# Patient Record
Sex: Female | Born: 1970 | Race: White | Hispanic: No | Smoking: Former smoker
Health system: Southern US, Community
[De-identification: ages and names within clinical notes are randomized; demographics above are authoritative.]

## PROBLEM LIST (undated history)

## (undated) DIAGNOSIS — L723 Sebaceous cyst: Secondary | ICD-10-CM

## (undated) DIAGNOSIS — M810 Age-related osteoporosis without current pathological fracture: Secondary | ICD-10-CM

## (undated) DIAGNOSIS — G43909 Migraine, unspecified, not intractable, without status migrainosus: Secondary | ICD-10-CM

## (undated) DIAGNOSIS — D696 Thrombocytopenia, unspecified: Secondary | ICD-10-CM

## (undated) DIAGNOSIS — S9000XA Contusion of unspecified ankle, initial encounter: Secondary | ICD-10-CM

## (undated) DIAGNOSIS — E785 Hyperlipidemia, unspecified: Secondary | ICD-10-CM

## (undated) DIAGNOSIS — J309 Allergic rhinitis, unspecified: Secondary | ICD-10-CM

## (undated) DIAGNOSIS — F329 Major depressive disorder, single episode, unspecified: Secondary | ICD-10-CM

## (undated) DIAGNOSIS — F17200 Nicotine dependence, unspecified, uncomplicated: Secondary | ICD-10-CM

## (undated) DIAGNOSIS — E109 Type 1 diabetes mellitus without complications: Secondary | ICD-10-CM

## (undated) DIAGNOSIS — B360 Pityriasis versicolor: Secondary | ICD-10-CM

## (undated) DIAGNOSIS — E11319 Type 2 diabetes mellitus with unspecified diabetic retinopathy without macular edema: Secondary | ICD-10-CM

## (undated) DIAGNOSIS — F319 Bipolar disorder, unspecified: Secondary | ICD-10-CM

## (undated) DIAGNOSIS — I10 Essential (primary) hypertension: Secondary | ICD-10-CM

## (undated) DIAGNOSIS — Z79899 Other long term (current) drug therapy: Secondary | ICD-10-CM

## (undated) HISTORY — DX: Nicotine dependence, unspecified, uncomplicated: F17.200

## (undated) HISTORY — DX: Allergic rhinitis, unspecified: J30.9

## (undated) HISTORY — DX: Sebaceous cyst: L72.3

## (undated) HISTORY — DX: Contusion of unspecified ankle, initial encounter: S90.00XA

## (undated) HISTORY — DX: Major depressive disorder, single episode, unspecified: F32.9

## (undated) HISTORY — DX: Type 1 diabetes mellitus without complications: E10.9

## (undated) HISTORY — DX: Migraine, unspecified, not intractable, without status migrainosus: G43.909

## (undated) HISTORY — DX: Bipolar disorder, unspecified: F31.9

## (undated) HISTORY — DX: Type 2 diabetes mellitus with unspecified diabetic retinopathy without macular edema: E11.319

## (undated) HISTORY — PX: TUBAL LIGATION: SHX77

## (undated) HISTORY — DX: Other long term (current) drug therapy: Z79.899

## (undated) HISTORY — DX: Thrombocytopenia, unspecified: D69.6

## (undated) HISTORY — DX: Pityriasis versicolor: B36.0

---

## 1996-12-10 HISTORY — PX: OTHER SURGICAL HISTORY: SHX169

## 1997-12-01 ENCOUNTER — Encounter: Payer: Self-pay | Admitting: Emergency Medicine

## 1997-12-01 ENCOUNTER — Emergency Department (HOSPITAL_COMMUNITY): Admission: EM | Admit: 1997-12-01 | Discharge: 1997-12-01 | Payer: Self-pay | Admitting: Emergency Medicine

## 1998-03-01 ENCOUNTER — Emergency Department (HOSPITAL_COMMUNITY): Admission: EM | Admit: 1998-03-01 | Discharge: 1998-03-01 | Payer: Self-pay | Admitting: Emergency Medicine

## 1999-05-12 ENCOUNTER — Ambulatory Visit (HOSPITAL_BASED_OUTPATIENT_CLINIC_OR_DEPARTMENT_OTHER): Admission: RE | Admit: 1999-05-12 | Discharge: 1999-05-12 | Payer: Self-pay | Admitting: Surgery

## 2000-06-29 ENCOUNTER — Encounter: Admission: RE | Admit: 2000-06-29 | Discharge: 2000-07-21 | Payer: Self-pay | Admitting: Orthopedic Surgery

## 2001-07-09 ENCOUNTER — Other Ambulatory Visit: Admission: RE | Admit: 2001-07-09 | Discharge: 2001-07-09 | Payer: Self-pay | Admitting: Obstetrics and Gynecology

## 2001-07-09 ENCOUNTER — Other Ambulatory Visit: Admission: RE | Admit: 2001-07-09 | Discharge: 2001-07-24 | Payer: Self-pay | Admitting: Obstetrics and Gynecology

## 2001-10-24 ENCOUNTER — Ambulatory Visit (HOSPITAL_COMMUNITY): Admission: RE | Admit: 2001-10-24 | Discharge: 2001-10-24 | Payer: Self-pay | Admitting: Obstetrics and Gynecology

## 2001-10-24 ENCOUNTER — Encounter: Payer: Self-pay | Admitting: Obstetrics and Gynecology

## 2001-11-08 ENCOUNTER — Encounter: Admission: RE | Admit: 2001-11-08 | Discharge: 2002-02-06 | Payer: Self-pay | Admitting: Endocrinology

## 2001-11-27 ENCOUNTER — Ambulatory Visit (HOSPITAL_COMMUNITY): Admission: RE | Admit: 2001-11-27 | Discharge: 2001-11-27 | Payer: Self-pay | Admitting: Obstetrics and Gynecology

## 2001-11-27 ENCOUNTER — Encounter: Payer: Self-pay | Admitting: Obstetrics and Gynecology

## 2001-12-17 ENCOUNTER — Inpatient Hospital Stay (HOSPITAL_COMMUNITY): Admission: AD | Admit: 2001-12-17 | Discharge: 2001-12-17 | Payer: Self-pay | Admitting: Obstetrics and Gynecology

## 2002-01-01 ENCOUNTER — Inpatient Hospital Stay (HOSPITAL_COMMUNITY): Admission: AD | Admit: 2002-01-01 | Discharge: 2002-01-04 | Payer: Self-pay | Admitting: Obstetrics and Gynecology

## 2003-09-02 ENCOUNTER — Other Ambulatory Visit: Admission: RE | Admit: 2003-09-02 | Discharge: 2003-09-02 | Payer: Self-pay | Admitting: Obstetrics and Gynecology

## 2003-10-21 ENCOUNTER — Ambulatory Visit (HOSPITAL_COMMUNITY): Admission: RE | Admit: 2003-10-21 | Discharge: 2003-10-21 | Payer: Self-pay | Admitting: Obstetrics and Gynecology

## 2003-12-15 ENCOUNTER — Inpatient Hospital Stay (HOSPITAL_COMMUNITY): Admission: AD | Admit: 2003-12-15 | Discharge: 2003-12-15 | Payer: Self-pay | Admitting: Obstetrics and Gynecology

## 2003-12-16 ENCOUNTER — Ambulatory Visit: Payer: Self-pay | Admitting: Endocrinology

## 2003-12-16 ENCOUNTER — Ambulatory Visit: Payer: Self-pay | Admitting: Internal Medicine

## 2003-12-16 ENCOUNTER — Inpatient Hospital Stay (HOSPITAL_COMMUNITY): Admission: AD | Admit: 2003-12-16 | Discharge: 2003-12-19 | Payer: Self-pay | Admitting: Endocrinology

## 2003-12-22 ENCOUNTER — Inpatient Hospital Stay (HOSPITAL_COMMUNITY): Admission: AD | Admit: 2003-12-22 | Discharge: 2003-12-24 | Payer: Self-pay | Admitting: Obstetrics and Gynecology

## 2003-12-29 ENCOUNTER — Ambulatory Visit (HOSPITAL_COMMUNITY): Admission: RE | Admit: 2003-12-29 | Discharge: 2003-12-29 | Payer: Self-pay | Admitting: Obstetrics and Gynecology

## 2004-01-14 ENCOUNTER — Inpatient Hospital Stay (HOSPITAL_COMMUNITY): Admission: AD | Admit: 2004-01-14 | Discharge: 2004-01-16 | Payer: Self-pay | Admitting: Obstetrics and Gynecology

## 2004-01-14 ENCOUNTER — Ambulatory Visit: Payer: Self-pay | Admitting: Internal Medicine

## 2004-01-20 ENCOUNTER — Inpatient Hospital Stay (HOSPITAL_COMMUNITY): Admission: AD | Admit: 2004-01-20 | Discharge: 2004-01-20 | Payer: Self-pay | Admitting: Obstetrics and Gynecology

## 2004-01-29 ENCOUNTER — Ambulatory Visit (HOSPITAL_COMMUNITY): Admission: RE | Admit: 2004-01-29 | Discharge: 2004-01-29 | Payer: Self-pay | Admitting: Obstetrics and Gynecology

## 2004-02-12 ENCOUNTER — Inpatient Hospital Stay (HOSPITAL_COMMUNITY): Admission: AD | Admit: 2004-02-12 | Discharge: 2004-02-22 | Payer: Self-pay | Admitting: Obstetrics and Gynecology

## 2004-02-18 ENCOUNTER — Encounter (INDEPENDENT_AMBULATORY_CARE_PROVIDER_SITE_OTHER): Payer: Self-pay | Admitting: *Deleted

## 2004-04-13 ENCOUNTER — Ambulatory Visit: Payer: Self-pay | Admitting: Endocrinology

## 2004-04-22 ENCOUNTER — Ambulatory Visit: Payer: Self-pay | Admitting: Endocrinology

## 2004-04-27 ENCOUNTER — Ambulatory Visit: Payer: Self-pay | Admitting: Endocrinology

## 2004-08-02 ENCOUNTER — Ambulatory Visit: Payer: Self-pay | Admitting: Endocrinology

## 2004-09-17 ENCOUNTER — Ambulatory Visit (HOSPITAL_BASED_OUTPATIENT_CLINIC_OR_DEPARTMENT_OTHER): Admission: RE | Admit: 2004-09-17 | Discharge: 2004-09-17 | Payer: Self-pay | Admitting: Endocrinology

## 2004-09-22 ENCOUNTER — Ambulatory Visit: Payer: Self-pay | Admitting: Pulmonary Disease

## 2004-10-05 ENCOUNTER — Ambulatory Visit: Payer: Self-pay | Admitting: Pulmonary Disease

## 2004-12-20 ENCOUNTER — Emergency Department (HOSPITAL_COMMUNITY): Admission: EM | Admit: 2004-12-20 | Discharge: 2004-12-20 | Payer: Self-pay | Admitting: Emergency Medicine

## 2005-01-04 ENCOUNTER — Ambulatory Visit: Payer: Self-pay | Admitting: Internal Medicine

## 2005-02-03 ENCOUNTER — Ambulatory Visit: Payer: Self-pay | Admitting: Endocrinology

## 2005-02-09 ENCOUNTER — Ambulatory Visit: Payer: Self-pay | Admitting: Pulmonary Disease

## 2005-02-15 ENCOUNTER — Encounter: Admission: RE | Admit: 2005-02-15 | Discharge: 2005-05-16 | Payer: Self-pay | Admitting: Endocrinology

## 2005-02-22 ENCOUNTER — Ambulatory Visit: Payer: Self-pay | Admitting: Endocrinology

## 2005-03-23 ENCOUNTER — Ambulatory Visit: Payer: Self-pay | Admitting: Endocrinology

## 2005-04-06 ENCOUNTER — Ambulatory Visit: Payer: Self-pay | Admitting: Pulmonary Disease

## 2005-06-06 ENCOUNTER — Ambulatory Visit: Payer: Self-pay | Admitting: Endocrinology

## 2005-06-06 ENCOUNTER — Ambulatory Visit: Payer: Self-pay | Admitting: Pulmonary Disease

## 2005-06-15 ENCOUNTER — Ambulatory Visit: Payer: Self-pay | Admitting: Endocrinology

## 2005-09-05 ENCOUNTER — Ambulatory Visit: Payer: Self-pay | Admitting: Endocrinology

## 2005-09-14 ENCOUNTER — Ambulatory Visit: Payer: Self-pay | Admitting: Endocrinology

## 2005-11-11 ENCOUNTER — Ambulatory Visit: Payer: Self-pay | Admitting: Endocrinology

## 2005-12-02 ENCOUNTER — Ambulatory Visit: Payer: Self-pay | Admitting: Endocrinology

## 2005-12-02 LAB — CONVERTED CEMR LAB: Hgb A1c MFr Bld: 6.7 % — ABNORMAL HIGH (ref 4.6–6.0)

## 2005-12-13 ENCOUNTER — Ambulatory Visit: Payer: Self-pay | Admitting: Endocrinology

## 2006-01-26 IMAGING — CR DG CHEST 2V
2 series · 2 of 2 positions shown · non-contrast
Comparison: none

CLINICAL DATA: Short breath, cough, fever

Chest 2 view:
No previous for comparison. Patchy air space infiltrate in the anterior segment
left upper lobe and lingula. Right lung clear. No effusion. Heart size and
pulmonary vascularity normal. Patient was shielded for the exam.

[view not recorded (1 of 2)]
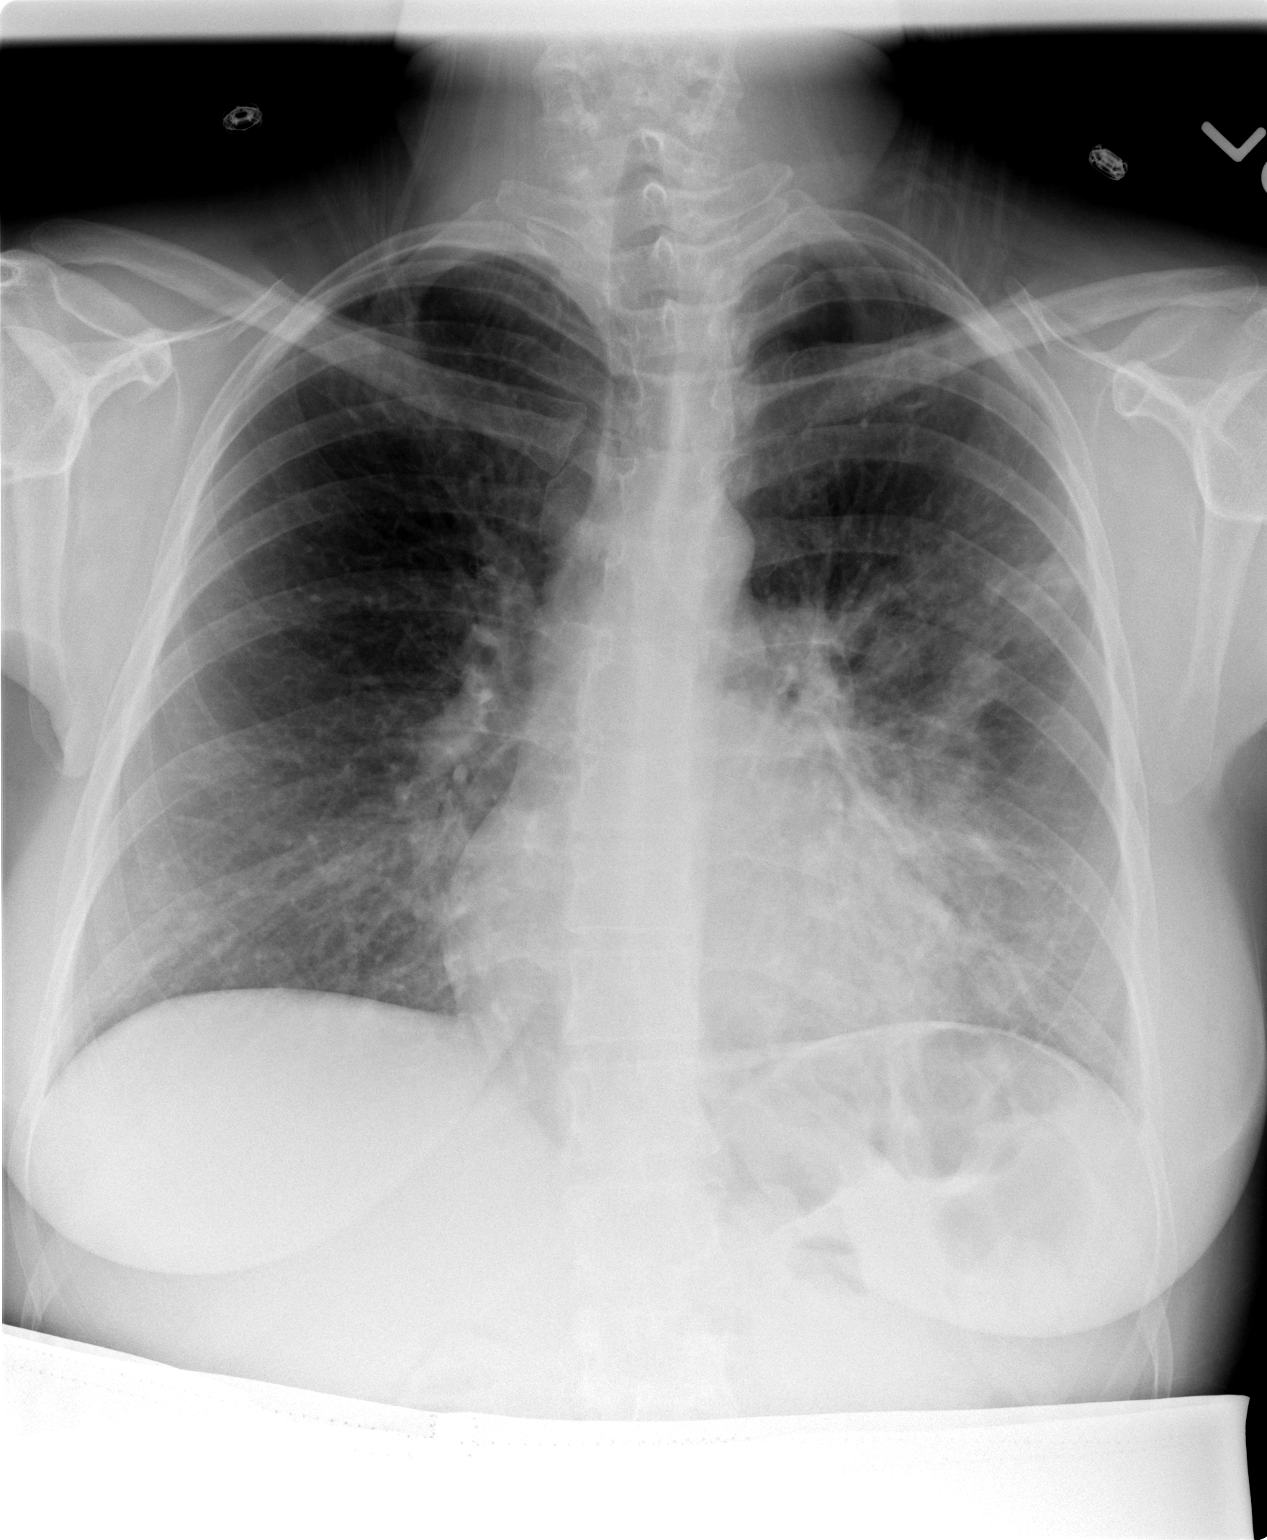

[view not recorded (2 of 2)]
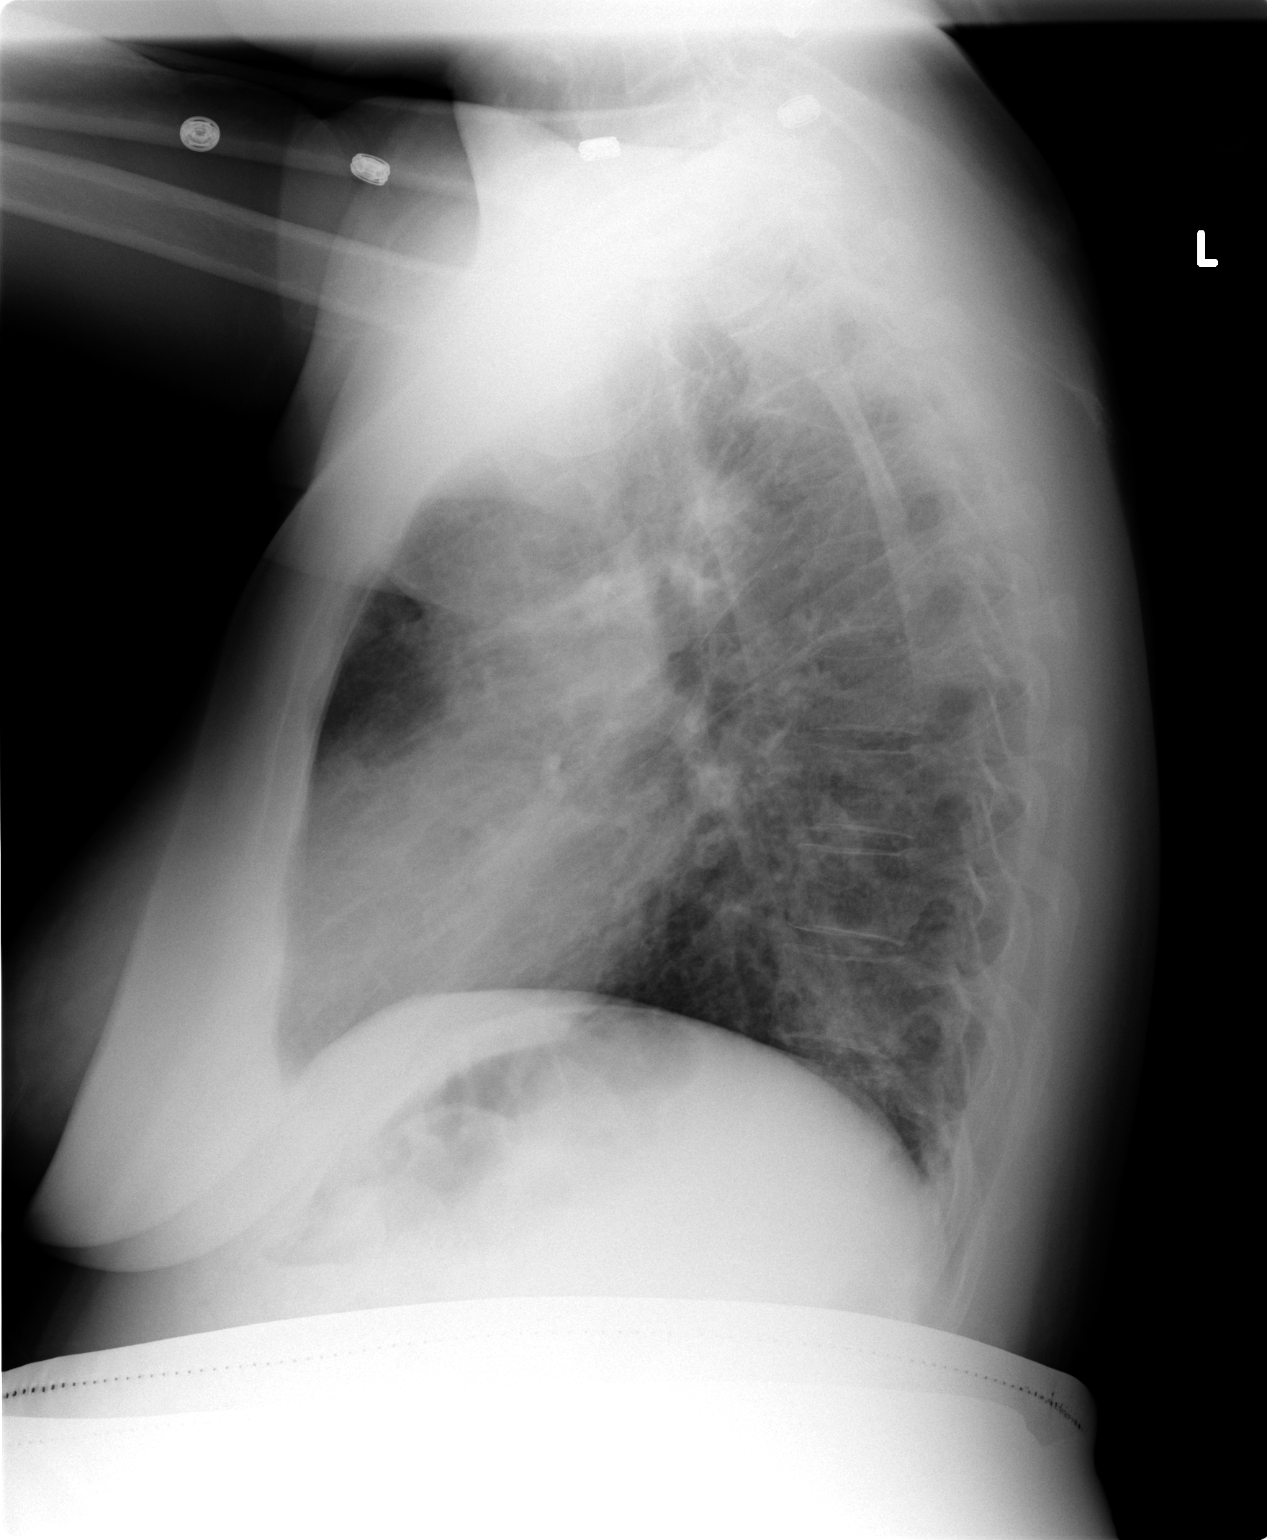

[2 of 2 positions shown; findings below may reference images not displayed]

IMPRESSION: 1. Patchy into left upper lobe and lingular airspace infiltrates consistent with
pneumonia.

## 2006-03-15 ENCOUNTER — Ambulatory Visit: Payer: Self-pay | Admitting: Endocrinology

## 2006-03-15 LAB — CONVERTED CEMR LAB: Hgb A1c MFr Bld: 7.6 % — ABNORMAL HIGH (ref 4.6–6.0)

## 2006-03-22 ENCOUNTER — Ambulatory Visit: Payer: Self-pay | Admitting: Endocrinology

## 2006-03-24 IMAGING — US US ABDOMEN COMPLETE
1 series · 17 of 25 positions shown · non-contrast
Comparison: none

CLINICAL DATA: 37 weeks estimated gestational age with nausea and vomiting.  
 ABDOMINAL ULTRASOUND:
 Multiple images of the abdomen were obtained.  The liver parenchyma appears homogeneous in echotexture without evidence for focal parenchymal abnormality or intrahepatic ductal dilation.  The gallbladder is well-distended and shows no evidence for intraluminal stones or sludge.  Incidentally noted is the presence of a phrygian cap.  The common bile duct measures .3 cm in AP width and this is within normal limits for size.  
 The pancreas was incompletely assessed due to shadowing from overlying gas.  Both kidneys are seen and have a normal appearance with the right kidney having a sagittal length of 11.3 cm and the left kidney having a sagittal length of 12.4 cm.  Two simple cysts are identified associated with the mid pole of the left kidney measuring 2 cm and 1.7 cm respectively.  No evidence for hydronephrosis is seen on either side.
 The spleen has a normal ultrasound appearance.  The proximal aspect of the aorta and inferior vena cava are within normal limits.  The distal aorta could not be assessed due to shadowing from the patient?s gravid uterus.

[Series 1: us abdomen complete · 17 of 97 slices shown]
[im 1/97]
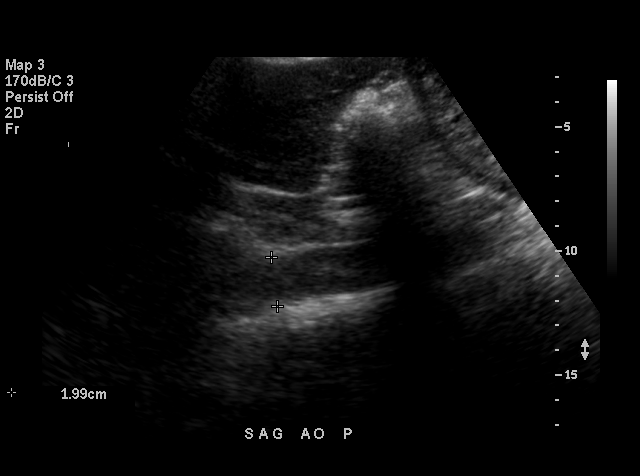
[im 9/97]
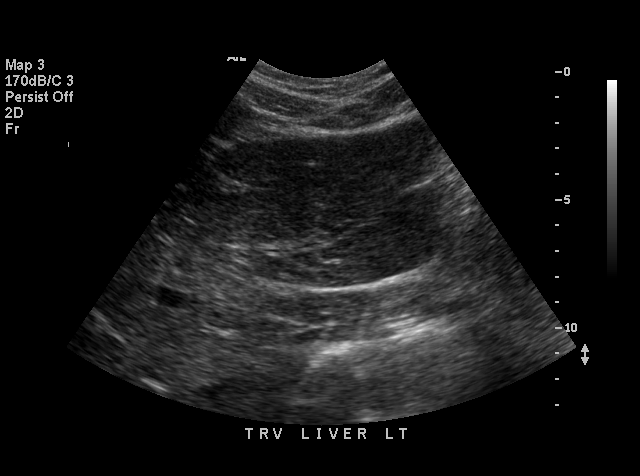
[im 13/97]
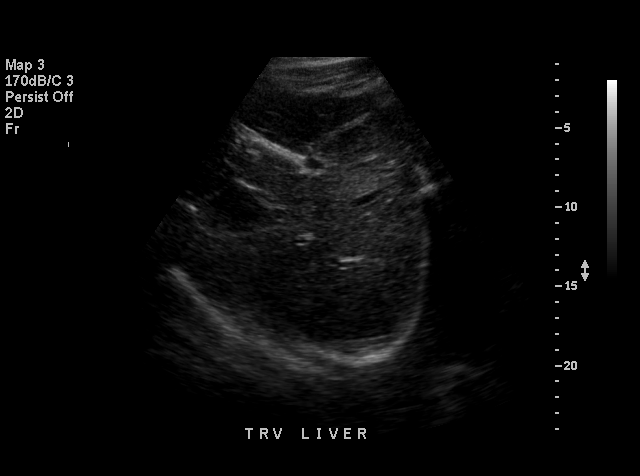
[im 21/97]
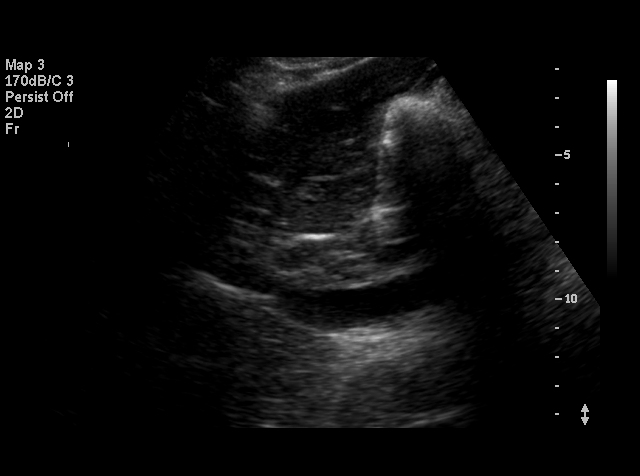
[im 25/97]
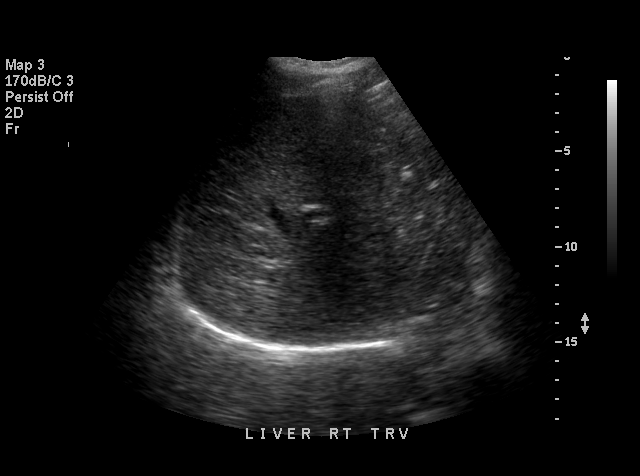
[im 33/97]
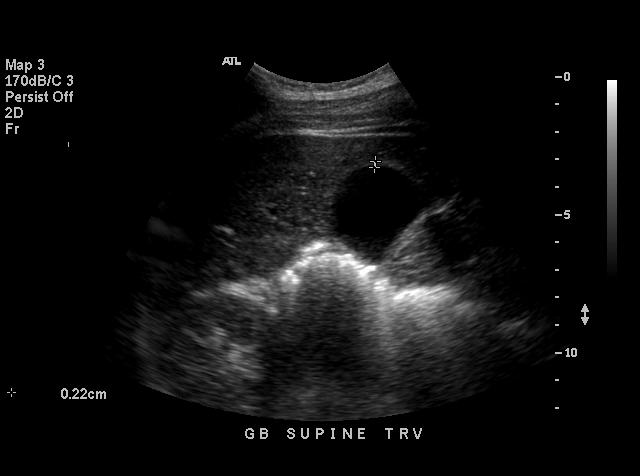
[im 37/97]
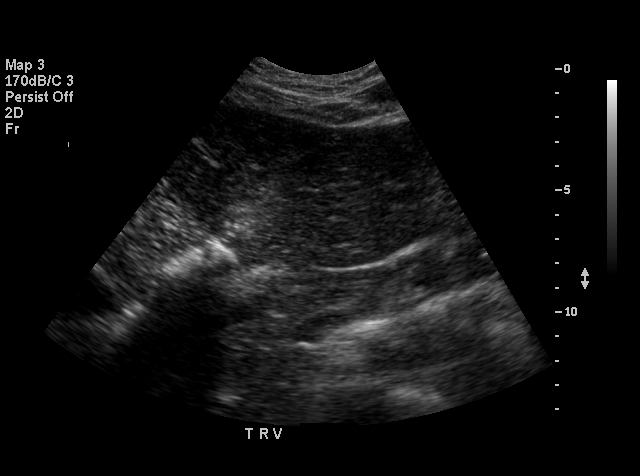
[im 45/97]
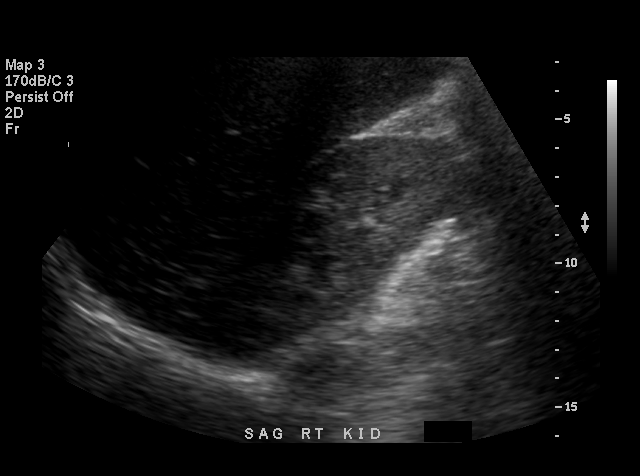
[im 49/97]
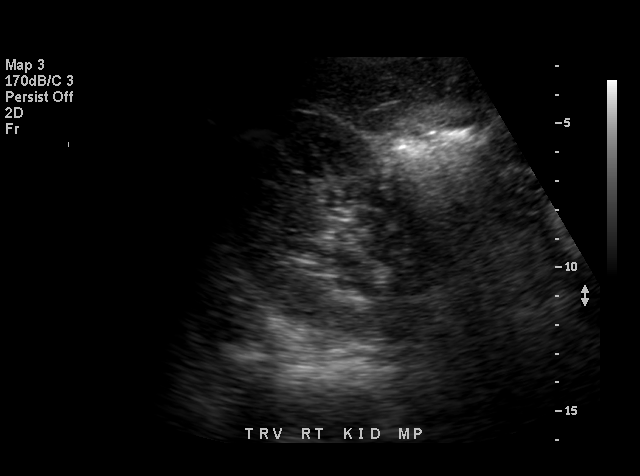
[im 53/97]
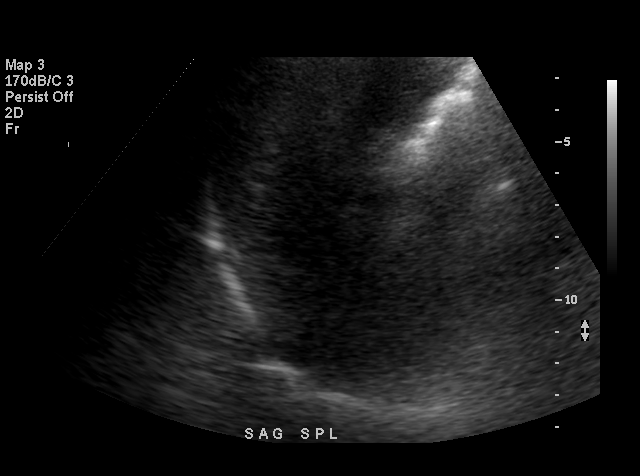
[im 61/97]
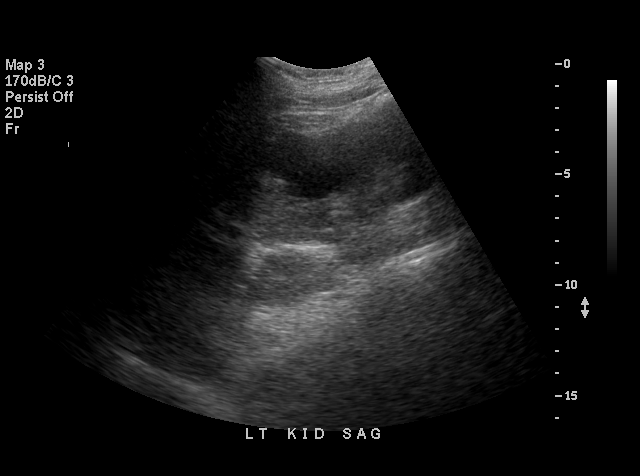
[im 65/97]
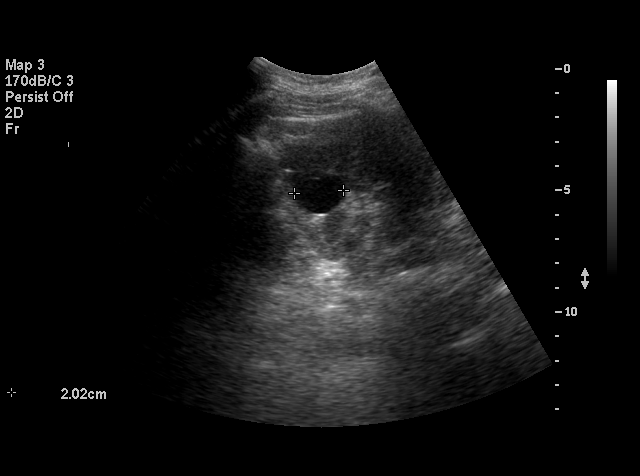
[im 73/97]
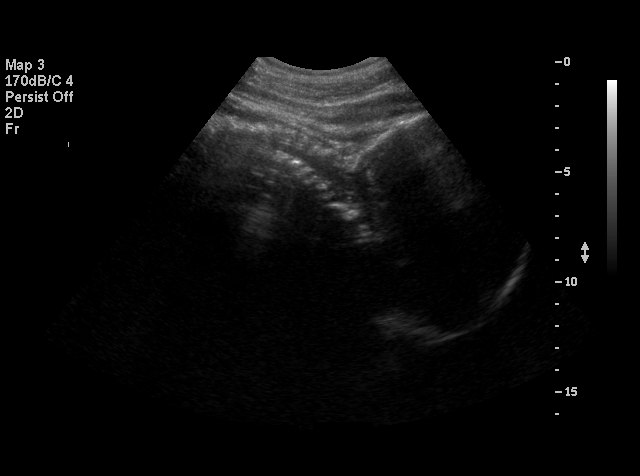
[im 77/97]
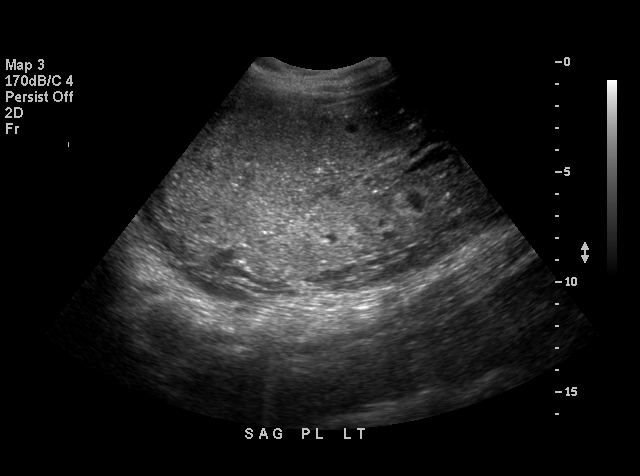
[im 85/97]
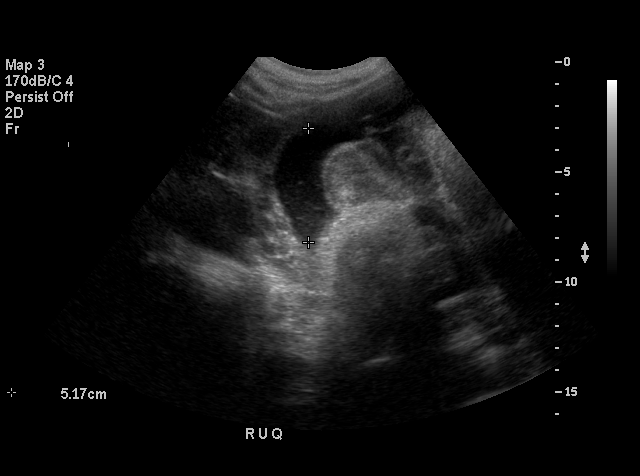
[im 89/97]
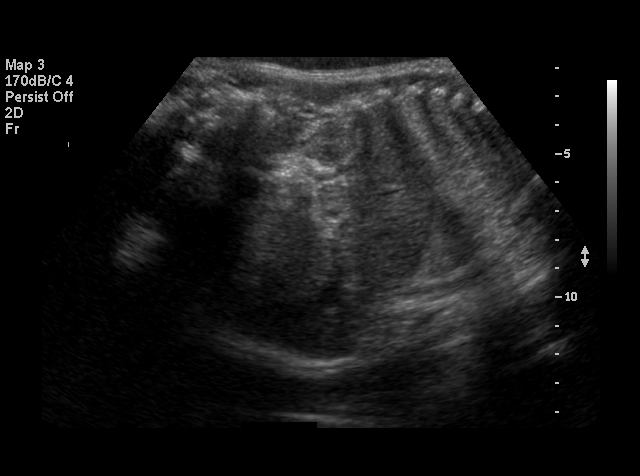
[im 97/97]
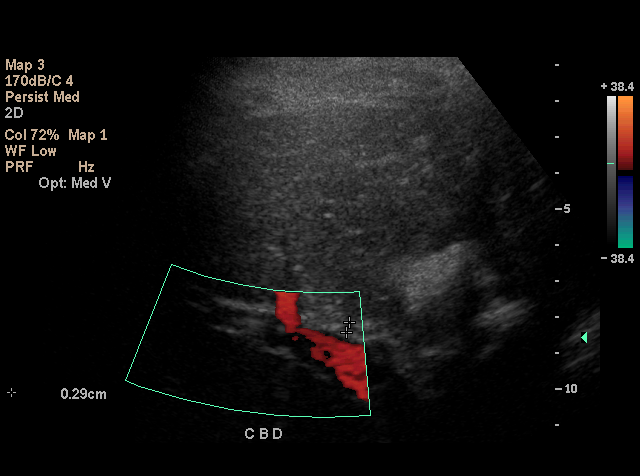

[17 of 25 positions shown; findings below may reference images not displayed]

IMPRESSION: Incomplete visualization of the pancreas.  Two simple left renal cysts.  Otherwise, unremarkable abdominal ultrasound.  

 BIOPHYSICAL PROFILE

 Number of Fetuses:  1
 Heart rate:  136
 Movement:  Yes
 Breathing:  No
 Presentation:  Cephalic
 Placental Location:  Posterior, left lateral 
 Grade:  II
 Previa:  No
 Amniotic Fluid (Subjective):  Normal
 Amniotic Fluid (Objective):  16.5 cm AFI (5th -95th%ile = 7.3 ? 23.9 cm for 38 wks)

 Fetal measurements and complete anatomic evaluation were not requested.  The following fetal anatomy was visualized on this exam:  Lateral ventricles, stomach, kidneys and bladder. 

 BPP SCORING
 Movements:  2  Time:  30 minutes
 Breathing:  0
 Tone:  2
 Amniotic Fluid:  2
 Total Score:  6

 MATERNAL UTERINE AND ADNEXAL FINDINGS
 Cervix:  Not evaluated
IMPRESSION: 1.  Biophysical profile score 6 of 8.  The fetus scored zero for fetal breathing for lack of sustained fetal breathing for 20 seconds. 
 2.  Subjectively and quantitatively normal amniotic fluid volume.
 3.  No late developing fetal anatomic abnormalities are identified associated with the lateral ventricles, stomach, kidneys or bladder.  The four chamber heart view could not be seen with clarity due to positioning on today?s exam.

## 2006-06-05 ENCOUNTER — Ambulatory Visit: Payer: Self-pay | Admitting: Endocrinology

## 2006-06-05 LAB — CONVERTED CEMR LAB
ALT: 23 units/L (ref 0–40)
AST: 17 units/L (ref 0–37)
Albumin: 4 g/dL (ref 3.5–5.2)
Alkaline Phosphatase: 53 units/L (ref 39–117)
BUN: 10 mg/dL (ref 6–23)
Basophils Absolute: 0 10*3/uL (ref 0.0–0.1)
Basophils Relative: 0.7 % (ref 0.0–1.0)
Bilirubin, Direct: 0.1 mg/dL (ref 0.0–0.3)
CO2: 29 meq/L (ref 19–32)
Calcium: 8.9 mg/dL (ref 8.4–10.5)
Chloride: 108 meq/L (ref 96–112)
Cholesterol: 150 mg/dL (ref 0–200)
Creatinine,U: 196.1 mg/dL
Eosinophils Absolute: 0.1 10*3/uL (ref 0.0–0.6)
Eosinophils Relative: 2.1 % (ref 0.0–5.0)
GFR calc Af Amer: 122 mL/min
GFR calc non Af Amer: 101 mL/min
Glucose, Bld: 114 mg/dL — ABNORMAL HIGH (ref 70–99)
HCT: 41.2 % (ref 36.0–46.0)
HDL: 55.8 mg/dL (ref >39.0)
Lymphocytes Relative: 25 % (ref 12.0–46.0)
MCHC: 34.7 g/dL (ref 30.0–36.0)
MCV: 94.7 fL (ref 78.0–100.0)
Microalb Creat Ratio: 11.7 mg/g (ref 0.0–30.0)
Monocytes Absolute: 0.4 10*3/uL (ref 0.2–0.7)
Monocytes Relative: 7.2 % (ref 3.0–11.0)
Neutrophils Relative %: 65 % (ref 43.0–77.0)
Platelets: 130 10*3/uL — ABNORMAL LOW (ref 150–400)
Potassium: 3.9 meq/L (ref 3.5–5.1)
RBC: 4.35 M/uL (ref 3.87–5.11)
Sodium: 141 meq/L (ref 135–145)
Total Bilirubin: 0.6 mg/dL (ref 0.3–1.2)
Total CHOL/HDL Ratio: 2.7
Total Protein: 6.4 g/dL (ref 6.0–8.3)
Triglycerides: 59 mg/dL (ref 0–149)
VLDL: 12 mg/dL (ref 0–40)
WBC: 5 10*3/uL (ref 4.5–10.5)

## 2006-06-13 ENCOUNTER — Ambulatory Visit: Payer: Self-pay | Admitting: Endocrinology

## 2006-06-13 HISTORY — PX: ELECTROCARDIOGRAM: SHX264

## 2006-08-03 ENCOUNTER — Encounter: Payer: Self-pay | Admitting: Endocrinology

## 2006-08-03 DIAGNOSIS — F329 Major depressive disorder, single episode, unspecified: Secondary | ICD-10-CM

## 2006-08-03 DIAGNOSIS — E109 Type 1 diabetes mellitus without complications: Secondary | ICD-10-CM

## 2006-08-03 DIAGNOSIS — F32A Depression, unspecified: Secondary | ICD-10-CM | POA: Insufficient documentation

## 2006-08-03 DIAGNOSIS — F3289 Other specified depressive episodes: Secondary | ICD-10-CM

## 2006-08-03 HISTORY — DX: Major depressive disorder, single episode, unspecified: F32.9

## 2006-08-03 HISTORY — DX: Type 1 diabetes mellitus without complications: E10.9

## 2006-08-03 HISTORY — DX: Other specified depressive episodes: F32.89

## 2006-10-30 ENCOUNTER — Ambulatory Visit: Payer: Self-pay | Admitting: Endocrinology

## 2006-10-30 LAB — CONVERTED CEMR LAB: Hgb A1c MFr Bld: 7 % — ABNORMAL HIGH (ref 4.6–6.0)

## 2007-03-06 ENCOUNTER — Ambulatory Visit: Payer: Self-pay | Admitting: Endocrinology

## 2007-03-06 DIAGNOSIS — G43909 Migraine, unspecified, not intractable, without status migrainosus: Secondary | ICD-10-CM | POA: Insufficient documentation

## 2007-03-06 DIAGNOSIS — J3089 Other allergic rhinitis: Secondary | ICD-10-CM

## 2007-03-06 DIAGNOSIS — J302 Other seasonal allergic rhinitis: Secondary | ICD-10-CM | POA: Insufficient documentation

## 2007-03-06 DIAGNOSIS — J309 Allergic rhinitis, unspecified: Secondary | ICD-10-CM

## 2007-03-06 HISTORY — DX: Migraine, unspecified, not intractable, without status migrainosus: G43.909

## 2007-03-06 HISTORY — DX: Allergic rhinitis, unspecified: J30.9

## 2007-03-06 LAB — CONVERTED CEMR LAB: Hgb A1c MFr Bld: 7.1 % — ABNORMAL HIGH (ref 4.6–6.0)

## 2007-03-09 ENCOUNTER — Telehealth (INDEPENDENT_AMBULATORY_CARE_PROVIDER_SITE_OTHER): Payer: Self-pay | Admitting: *Deleted

## 2007-04-18 ENCOUNTER — Telehealth (INDEPENDENT_AMBULATORY_CARE_PROVIDER_SITE_OTHER): Payer: Self-pay | Admitting: *Deleted

## 2007-04-18 ENCOUNTER — Ambulatory Visit: Payer: Self-pay | Admitting: Endocrinology

## 2007-04-18 DIAGNOSIS — R05 Cough: Secondary | ICD-10-CM

## 2007-04-18 DIAGNOSIS — R059 Cough, unspecified: Secondary | ICD-10-CM | POA: Insufficient documentation

## 2007-07-13 ENCOUNTER — Ambulatory Visit: Payer: Self-pay | Admitting: Endocrinology

## 2007-07-13 DIAGNOSIS — B360 Pityriasis versicolor: Secondary | ICD-10-CM | POA: Insufficient documentation

## 2007-07-13 DIAGNOSIS — L723 Sebaceous cyst: Secondary | ICD-10-CM

## 2007-07-13 HISTORY — DX: Pityriasis versicolor: B36.0

## 2007-07-13 HISTORY — DX: Sebaceous cyst: L72.3

## 2007-07-15 LAB — CONVERTED CEMR LAB
ALT: 14 units/L (ref 0–35)
Albumin: 4.1 g/dL (ref 3.5–5.2)
BUN: 10 mg/dL (ref 6–23)
Bacteria, UA: NEGATIVE
Basophils Absolute: 0 10*3/uL (ref 0.0–0.1)
Basophils Relative: 0.8 % (ref 0.0–1.0)
Bilirubin Urine: NEGATIVE
Bilirubin, Direct: 0.1 mg/dL (ref 0.0–0.3)
CO2: 31 meq/L (ref 19–32)
Calcium: 9.2 mg/dL (ref 8.4–10.5)
Chloride: 104 meq/L (ref 96–112)
Cholesterol: 145 mg/dL (ref 0–200)
Creatinine, Ser: 0.8 mg/dL (ref 0.4–1.2)
Creatinine,U: 65.4 mg/dL
Crystals: NEGATIVE
Eosinophils Absolute: 0 10*3/uL (ref 0.0–0.7)
Eosinophils Relative: 0.7 % (ref 0.0–5.0)
GFR calc Af Amer: 104 mL/min
GFR calc non Af Amer: 86 mL/min
HDL: 61.1 mg/dL (ref >39.0)
Hemoglobin, Urine: NEGATIVE
Hemoglobin: 13.6 g/dL (ref 12.0–15.0)
Hgb A1c MFr Bld: 6.8 % — ABNORMAL HIGH (ref 4.6–6.0)
Ketones, ur: NEGATIVE mg/dL
LDL Cholesterol: 73 mg/dL (ref 0–99)
Leukocytes, UA: NEGATIVE
Lymphocytes Relative: 19.9 % (ref 12.0–46.0)
MCHC: 34.7 g/dL (ref 30.0–36.0)
MCV: 96.4 fL (ref 78.0–100.0)
Microalb Creat Ratio: 6.1 mg/g (ref 0.0–30.0)
Microalb, Ur: 0.4 mg/dL (ref 0.0–1.9)
Monocytes Absolute: 0.4 10*3/uL (ref 0.1–1.0)
Mucus, UA: NEGATIVE
Neutro Abs: 4.1 10*3/uL (ref 1.4–7.7)
Neutrophils Relative %: 71.3 % (ref 43.0–77.0)
Nitrite: NEGATIVE
Platelets: 127 10*3/uL — ABNORMAL LOW (ref 150–400)
RBC / HPF: NONE SEEN
RBC: 4.05 M/uL (ref 3.87–5.11)
RDW: 12.6 % (ref 11.5–14.6)
Specific Gravity, Urine: 1.01 (ref 1.000–1.03)
TSH: 2.26 microintl units/mL (ref 0.35–5.50)
Total Bilirubin: 0.6 mg/dL (ref 0.3–1.2)
Total CHOL/HDL Ratio: 2.4
Total Protein, Urine: NEGATIVE mg/dL
Total Protein: 6.4 g/dL (ref 6.0–8.3)
Triglycerides: 56 mg/dL (ref 0–149)
Urobilinogen, UA: 0.2 (ref 0.0–1.0)
VLDL: 11 mg/dL (ref 0–40)
WBC: 5.6 10*3/uL (ref 4.5–10.5)
pH: 6.5 (ref 5.0–8.0)

## 2007-07-16 ENCOUNTER — Encounter: Payer: Self-pay | Admitting: Endocrinology

## 2007-07-30 ENCOUNTER — Ambulatory Visit: Payer: Self-pay | Admitting: Endocrinology

## 2007-08-28 ENCOUNTER — Ambulatory Visit: Payer: Self-pay | Admitting: Endocrinology

## 2007-08-28 DIAGNOSIS — S9000XA Contusion of unspecified ankle, initial encounter: Secondary | ICD-10-CM | POA: Insufficient documentation

## 2007-08-28 HISTORY — DX: Contusion of unspecified ankle, initial encounter: S90.00XA

## 2008-02-04 ENCOUNTER — Ambulatory Visit: Payer: Self-pay | Admitting: Endocrinology

## 2008-02-04 LAB — CONVERTED CEMR LAB: Hgb A1c MFr Bld: 7.2 % — ABNORMAL HIGH (ref 4.6–6.0)

## 2008-04-24 ENCOUNTER — Ambulatory Visit: Payer: Self-pay | Admitting: Endocrinology

## 2008-04-26 LAB — CONVERTED CEMR LAB
ALT: 20 units/L (ref 0–35)
AST: 17 units/L (ref 0–37)
Albumin: 3.9 g/dL (ref 3.5–5.2)
Alkaline Phosphatase: 58 units/L (ref 39–117)
BUN: 12 mg/dL (ref 6–23)
Basophils Absolute: 0.1 10*3/uL (ref 0.0–0.1)
Basophils Relative: 0.9 % (ref 0.0–3.0)
Bilirubin Urine: NEGATIVE
Bilirubin, Direct: 0.1 mg/dL (ref 0.0–0.3)
CO2: 30 meq/L (ref 19–32)
Calcium: 9 mg/dL (ref 8.4–10.5)
Chloride: 102 meq/L (ref 96–112)
Cholesterol: 217 mg/dL — ABNORMAL HIGH (ref 0–200)
Creatinine, Ser: 0.7 mg/dL (ref 0.4–1.2)
Creatinine,U: 150.4 mg/dL
Direct LDL: 141.4 mg/dL
Eosinophils Absolute: 0.1 10*3/uL (ref 0.0–0.7)
Eosinophils Relative: 1.1 % (ref 0.0–5.0)
GFR calc non Af Amer: 99.24 mL/min (ref >60)
Glucose, Bld: 108 mg/dL — ABNORMAL HIGH (ref 70–99)
HCT: 40.4 % (ref 36.0–46.0)
HDL: 61.4 mg/dL (ref >39.00)
Hemoglobin, Urine: NEGATIVE
Hemoglobin: 13.9 g/dL (ref 12.0–15.0)
Hgb A1c MFr Bld: 6.3 % (ref 4.6–6.5)
Ketones, ur: NEGATIVE mg/dL
Leukocytes, UA: NEGATIVE
Lymphocytes Relative: 14 % (ref 12.0–46.0)
Lymphs Abs: 1.1 10*3/uL (ref 0.7–4.0)
MCHC: 34.3 g/dL (ref 30.0–36.0)
MCV: 98.8 fL (ref 78.0–100.0)
Microalb Creat Ratio: 8.6 mg/g (ref 0.0–30.0)
Microalb, Ur: 1.3 mg/dL (ref 0.0–1.9)
Monocytes Absolute: 0.5 10*3/uL (ref 0.1–1.0)
Monocytes Relative: 6.6 % (ref 3.0–12.0)
Neutro Abs: 5.8 10*3/uL (ref 1.4–7.7)
Neutrophils Relative %: 77.4 % — ABNORMAL HIGH (ref 43.0–77.0)
Nitrite: NEGATIVE
Platelets: 123 10*3/uL — ABNORMAL LOW (ref 150.0–400.0)
Potassium: 4.5 meq/L (ref 3.5–5.1)
RBC: 4.09 M/uL (ref 3.87–5.11)
RDW: 12.3 % (ref 11.5–14.6)
Sodium: 137 meq/L (ref 135–145)
Specific Gravity, Urine: 1.015 (ref 1.000–1.030)
TSH: 2.17 microintl units/mL (ref 0.35–5.50)
Total Bilirubin: 0.5 mg/dL (ref 0.3–1.2)
Total CHOL/HDL Ratio: 4
Total Protein, Urine: NEGATIVE mg/dL
Total Protein: 6.3 g/dL (ref 6.0–8.3)
Triglycerides: 68 mg/dL (ref 0.0–149.0)
Urine Glucose: NEGATIVE mg/dL
Urobilinogen, UA: 0.2 (ref 0.0–1.0)
VLDL: 13.6 mg/dL (ref 0.0–40.0)
WBC: 7.6 10*3/uL (ref 4.5–10.5)
pH: 5.5 (ref 5.0–8.0)

## 2008-05-07 ENCOUNTER — Ambulatory Visit: Payer: Self-pay | Admitting: Endocrinology

## 2008-05-07 DIAGNOSIS — D696 Thrombocytopenia, unspecified: Secondary | ICD-10-CM

## 2008-05-07 HISTORY — DX: Thrombocytopenia, unspecified: D69.6

## 2008-07-18 ENCOUNTER — Telehealth: Payer: Self-pay | Admitting: Endocrinology

## 2008-07-18 ENCOUNTER — Ambulatory Visit: Payer: Self-pay | Admitting: Endocrinology

## 2008-07-29 ENCOUNTER — Ambulatory Visit: Payer: Self-pay | Admitting: Internal Medicine

## 2008-07-29 LAB — CONVERTED CEMR LAB
Basophils Absolute: 0 10*3/uL (ref 0.0–0.1)
Basophils Relative: 0.4 % (ref 0.0–3.0)
Eosinophils Absolute: 0.1 10*3/uL (ref 0.0–0.7)
Eosinophils Relative: 2.4 % (ref 0.0–5.0)
HCT: 41.2 % (ref 36.0–46.0)
Hemoglobin: 14.4 g/dL (ref 12.0–15.0)
Lymphocytes Relative: 21.6 % (ref 12.0–46.0)
Lymphs Abs: 1.1 10*3/uL (ref 0.7–4.0)
MCHC: 34.9 g/dL (ref 30.0–36.0)
MCV: 97.3 fL (ref 78.0–100.0)
Monocytes Absolute: 0.4 10*3/uL (ref 0.1–1.0)
Monocytes Relative: 9 % (ref 3.0–12.0)
Neutro Abs: 3.3 10*3/uL (ref 1.4–7.7)
Neutrophils Relative %: 66.6 % (ref 43.0–77.0)
Platelets: 99 10*3/uL — ABNORMAL LOW (ref 150.0–400.0)
RBC: 4.23 M/uL (ref 3.87–5.11)
RDW: 12.6 % (ref 11.5–14.6)
WBC: 4.9 10*3/uL (ref 4.5–10.5)

## 2008-07-30 LAB — CONVERTED CEMR LAB
ALT: 13 units/L (ref 0–35)
AST: 15 units/L (ref 0–37)
Albumin: 4 g/dL (ref 3.5–5.2)
Alkaline Phosphatase: 40 units/L (ref 39–117)
Bilirubin, Direct: 0.1 mg/dL (ref 0.0–0.3)
Cholesterol: 166 mg/dL (ref 0–200)
HDL: 62 mg/dL (ref >39.00)
Hgb A1c MFr Bld: 6.1 % (ref 4.6–6.5)
LDL Cholesterol: 93 mg/dL (ref 0–99)
Total Bilirubin: 0.9 mg/dL (ref 0.3–1.2)
Total CHOL/HDL Ratio: 3
Total Protein: 6.6 g/dL (ref 6.0–8.3)
Triglycerides: 55 mg/dL (ref 0.0–149.0)
VLDL: 11 mg/dL (ref 0.0–40.0)

## 2008-08-05 ENCOUNTER — Ambulatory Visit: Payer: Self-pay | Admitting: Endocrinology

## 2008-08-05 DIAGNOSIS — E7800 Pure hypercholesterolemia, unspecified: Secondary | ICD-10-CM | POA: Insufficient documentation

## 2008-08-14 ENCOUNTER — Ambulatory Visit: Payer: Self-pay | Admitting: Internal Medicine

## 2008-09-10 ENCOUNTER — Telehealth: Payer: Self-pay | Admitting: Internal Medicine

## 2008-10-30 ENCOUNTER — Ambulatory Visit: Payer: Self-pay | Admitting: Endocrinology

## 2008-10-30 LAB — CONVERTED CEMR LAB: Hgb A1c MFr Bld: 5.8 % (ref 4.6–6.5)

## 2008-11-10 ENCOUNTER — Ambulatory Visit: Payer: Self-pay | Admitting: Endocrinology

## 2009-02-05 ENCOUNTER — Ambulatory Visit: Payer: Self-pay | Admitting: Endocrinology

## 2009-02-10 LAB — CONVERTED CEMR LAB: Hgb A1c MFr Bld: 6.2 % (ref 4.6–6.5)

## 2009-02-16 ENCOUNTER — Ambulatory Visit: Payer: Self-pay | Admitting: Endocrinology

## 2009-05-07 ENCOUNTER — Ambulatory Visit: Payer: Self-pay | Admitting: Endocrinology

## 2009-05-07 LAB — CONVERTED CEMR LAB: Hgb A1c MFr Bld: 6.7 % — ABNORMAL HIGH (ref 4.6–6.5)

## 2009-05-19 ENCOUNTER — Ambulatory Visit: Payer: Self-pay | Admitting: Endocrinology

## 2009-08-04 ENCOUNTER — Ambulatory Visit: Payer: Self-pay | Admitting: Endocrinology

## 2009-08-04 LAB — CONVERTED CEMR LAB
ALT: 18 units/L (ref 0–35)
AST: 18 units/L (ref 0–37)
Albumin: 3.9 g/dL (ref 3.5–5.2)
Alkaline Phosphatase: 49 units/L (ref 39–117)
BUN: 14 mg/dL (ref 6–23)
Basophils Absolute: 0 10*3/uL (ref 0.0–0.1)
Basophils Relative: 0.8 % (ref 0.0–3.0)
Bilirubin Urine: NEGATIVE
Bilirubin, Direct: 0.1 mg/dL (ref 0.0–0.3)
CO2: 28 meq/L (ref 19–32)
Calcium: 8.8 mg/dL (ref 8.4–10.5)
Chloride: 107 meq/L (ref 96–112)
Cholesterol: 156 mg/dL (ref 0–200)
Creatinine, Ser: 0.5 mg/dL (ref 0.4–1.2)
Creatinine,U: 106.2 mg/dL
Eosinophils Absolute: 0.1 10*3/uL (ref 0.0–0.7)
Eosinophils Relative: 1.3 % (ref 0.0–5.0)
GFR calc non Af Amer: 152.37 mL/min (ref >60)
Glucose, Bld: 112 mg/dL — ABNORMAL HIGH (ref 70–99)
HCT: 40.5 % (ref 36.0–46.0)
HDL: 58.8 mg/dL (ref >39.00)
Hemoglobin: 13.9 g/dL (ref 12.0–15.0)
Hgb A1c MFr Bld: 6.4 % (ref 4.6–6.5)
Ketones, ur: NEGATIVE mg/dL
LDL Cholesterol: 86 mg/dL (ref 0–99)
Leukocytes, UA: NEGATIVE
Lymphocytes Relative: 15.4 % (ref 12.0–46.0)
Lymphs Abs: 0.9 10*3/uL (ref 0.7–4.0)
MCHC: 34.3 g/dL (ref 30.0–36.0)
MCV: 99.1 fL (ref 78.0–100.0)
Microalb Creat Ratio: 1.1 mg/g (ref 0.0–30.0)
Microalb, Ur: 1.2 mg/dL (ref 0.0–1.9)
Monocytes Absolute: 0.3 10*3/uL (ref 0.1–1.0)
Monocytes Relative: 5.9 % (ref 3.0–12.0)
Neutro Abs: 4.4 10*3/uL (ref 1.4–7.7)
Neutrophils Relative %: 76.6 % (ref 43.0–77.0)
Nitrite: NEGATIVE
Platelets: 103 10*3/uL — ABNORMAL LOW (ref 150.0–400.0)
Potassium: 4.5 meq/L (ref 3.5–5.1)
RBC: 4.09 M/uL (ref 3.87–5.11)
RDW: 13.3 % (ref 11.5–14.6)
Sodium: 138 meq/L (ref 135–145)
Specific Gravity, Urine: 1.025 (ref 1.000–1.030)
TSH: 2.05 microintl units/mL (ref 0.35–5.50)
Total Bilirubin: 0.7 mg/dL (ref 0.3–1.2)
Total CHOL/HDL Ratio: 3
Total Protein, Urine: NEGATIVE mg/dL
Total Protein: 6.2 g/dL (ref 6.0–8.3)
Triglycerides: 57 mg/dL (ref 0.0–149.0)
Urine Glucose: NEGATIVE mg/dL
Urobilinogen, UA: 0.2 (ref 0.0–1.0)
VLDL: 11.4 mg/dL (ref 0.0–40.0)
WBC: 5.7 10*3/uL (ref 4.5–10.5)
pH: 6.5 (ref 5.0–8.0)

## 2009-08-11 ENCOUNTER — Ambulatory Visit: Payer: Self-pay | Admitting: Endocrinology

## 2009-08-11 DIAGNOSIS — R319 Hematuria, unspecified: Secondary | ICD-10-CM | POA: Insufficient documentation

## 2009-08-11 DIAGNOSIS — F17200 Nicotine dependence, unspecified, uncomplicated: Secondary | ICD-10-CM

## 2009-08-11 HISTORY — DX: Nicotine dependence, unspecified, uncomplicated: F17.200

## 2009-10-13 ENCOUNTER — Emergency Department (HOSPITAL_COMMUNITY): Admission: EM | Admit: 2009-10-13 | Discharge: 2009-10-13 | Payer: Self-pay | Admitting: Emergency Medicine

## 2009-11-10 ENCOUNTER — Ambulatory Visit: Payer: Self-pay | Admitting: Endocrinology

## 2009-11-10 LAB — CONVERTED CEMR LAB: Hgb A1c MFr Bld: 6.3 % (ref 4.6–6.5)

## 2009-11-17 ENCOUNTER — Encounter: Payer: Self-pay | Admitting: Endocrinology

## 2009-11-28 ENCOUNTER — Observation Stay (HOSPITAL_COMMUNITY): Admission: EM | Admit: 2009-11-28 | Discharge: 2009-11-30 | Payer: Self-pay | Admitting: Emergency Medicine

## 2009-12-03 ENCOUNTER — Ambulatory Visit: Payer: Self-pay | Admitting: Endocrinology

## 2009-12-11 ENCOUNTER — Telehealth: Payer: Self-pay | Admitting: Endocrinology

## 2009-12-12 ENCOUNTER — Encounter: Payer: Self-pay | Admitting: Endocrinology

## 2009-12-15 ENCOUNTER — Encounter: Admission: RE | Admit: 2009-12-15 | Discharge: 2009-12-15 | Payer: Self-pay | Admitting: Obstetrics and Gynecology

## 2009-12-21 ENCOUNTER — Telehealth: Payer: Self-pay | Admitting: Endocrinology

## 2009-12-23 ENCOUNTER — Encounter: Payer: Self-pay | Admitting: Endocrinology

## 2010-01-11 ENCOUNTER — Ambulatory Visit: Payer: Self-pay | Admitting: Endocrinology

## 2010-02-08 ENCOUNTER — Telehealth: Payer: Self-pay | Admitting: Endocrinology

## 2010-02-14 ENCOUNTER — Encounter: Payer: Self-pay | Admitting: Obstetrics and Gynecology

## 2010-02-21 LAB — CONVERTED CEMR LAB
Bilirubin Urine: NEGATIVE
Hemoglobin, Urine: NEGATIVE
Ketones, ur: NEGATIVE mg/dL
Leukocytes, UA: NEGATIVE
Nitrite: NEGATIVE
Total Protein, Urine: NEGATIVE mg/dL
Urine Glucose: NEGATIVE mg/dL
Urobilinogen, UA: 0.2 (ref 0.0–1.0)
pH: 6 (ref 5.0–8.0)

## 2010-02-25 NOTE — Letter (Signed)
[facility]   Imported By: Sherian Rein 12/25/2009 08:04:08  _____________________________________________________________________  External Attachment:    Type:   Image     Comment:   External Document

## 2010-02-25 NOTE — Progress Notes (Signed)
  Phone Note Call from Patient Call back at Home Phone [phone]   Caller: [facility] - [phone] press [phone] Call For: Minus Breeding MD Summary of Call: Mail order prescriptions has question's about prescriptions. Initial call taken by: Verdell Face,  February 08, 2010 12:34 PM  Follow-up for Phone Call        Pharmacy called requesting 90 day supply on pt dressing, verbal given for 90 days upply 1 additional refill. Follow-up by: Margaret Pyle, CMA,  February 09, 2010 1:59 PM    Prescriptions: OPSITE IV 3000  MISC (TRANSPARENT DRESSINGS) change every 3 days  #30 x 1   Entered and Authorized by:   Margaret Pyle, CMA   Signed by:   Margaret Pyle, CMA on 02/09/2010   Method used:   Telephoned to ...       [facility]  [address] [id number]       [address]       [place], [place]  [address]       Ph: [phone] or [phone]       Fax: [phone]   RxID:   [id number]

## 2010-02-25 NOTE — Assessment & Plan Note (Signed)
Summary: PER PT 3 MTH FU---STC   Vital Signs:  Patient profile:   40 year old female Height:      67 inches (170.18 cm) Weight:      149.50 pounds (67.95 kg) BMI:     23.50 O2 Sat:      98 % on Room air Temp:     99.0 degrees F (37.22 degrees C) Pulse rate:   70 / minute BP sitting:   112 / 66  (left arm) Cuff size:   regular  Vitals Entered By: Brenton Grills MA (November 10, 2009 8:31 AM)  O2 Flow:  Room air CC: 3 month F/U/aj Is Patient Diabetic? Yes   CC:  3 month F/U/aj.  History of Present Illness: pt says she had an episode of cbg of 400.  she is not sure why this was.  she was able to normalize with extra insulin boluses.  otherwise, pt states she feels well in general.  Current Medications (verified): 1)  Insulin Pump [id number]   Kit (Insulin Infusion Pump) .... Change Q3d 2)  Geodon 60 Mg  Caps (Ziprasidone Hcl) .... Take 1 By Mouth Qd 3)  Lamictal 150 Mg  Tabs (Lamotrigine) .... Take 1 By Mouth Qd 4)  Tegretol Xr 200 Mg  Tb12 (Carbamazepine) .... Take 2 Qam By Mouth Qd 5)  Deltec Cozmo Cleo Set 24" 6mm   Misc (Insulin Infusion Pump Supplies) .... Size of Patient's Preference, With Cartridge, Change Q3d 6)  Humalog 100 Unit/ml  Soln (Insulin Lispro (Human)) .... For Use in Pump, Total 70 Units/day 7)  Crestor 40 Mg Tabs (Rosuvastatin Calcium) .Marland Kitchen.. 1 Qhs  Allergies (verified): 1)  ! Chantix (Varenicline Tartrate)  Past History:  Past Medical History: Last updated: 08/28/2007 Smoker DM Retinopathy Decrease Platelets CONTUSION, ANKLE (ICD-924.21) SEBACEOUS CYST, INFECTED (ICD-706.2) ROUTINE GENERAL MEDICAL EXAM@HEALTH  CARE FACL (ICD-V70.0) HEPATOTOXICITY, DRUG-INDUCED, RISK OF (ICD-V58.69) TINEA VERSICOLOR (ICD-111.0) COUGH (ICD-786.2) ALLERGIC RHINITIS CAUSE UNSPECIFIED (ICD-477.9) MIGRAINE, CHRONIC (ICD-346.90) DIABETES MELLITUS, TYPE I (ICD-250.01) DEPRESSION (ICD-311)  Review of Systems  The patient denies fever and dyspnea on exertion.     denies n/v  Physical Exam  General:  normal appearance.   Skin:  infusion sites without lesions.   Additional Exam:  Hemoglobin A1C   6.3 %     Impression & Recommendations:  Problem # 1:  DIABETES MELLITUS, TYPE I (ICD-250.01) ? overcontrolled  Other Orders: TLB-A1C / Hgb A1C (Glycohemoglobin) (83036-A1C) Est. Patient Level III (29562)  Patient Instructions: 1)  blood tests are being ordered for you today.  please call [phone] to hear your test results. 2)  pending the test results, please: 3)  continue basal rate of 1 unit/hr, except for 0.9 units/hr, 3 am to 6 am 4)  continue mealtime bolus to 1 unit/ 11 grams carbohydrate.  however, subtract 1 unit from calculated lunch bolus. 5)  continue correction bolus (which some people call "sensitivity," or "insulin sensitivity ratio," or just "isr") of 1 unit for each 50 by which your glucose exceeds 100. 6)  Please schedule a follow-up appointment in 3-4 months. 7)  (update: i left message on phone-tree:  continue to check cbg's and call us with what time of day or situation it is lowest).    Orders Added: 1)  TLB-A1C / Hgb A1C (Glycohemoglobin) [83036-A1C] 2)  Est. Patient Level III [84696]

## 2010-02-25 NOTE — Assessment & Plan Note (Signed)
Summary: PER PT 3 MTH FU  STC   Vital Signs:  Patient profile:   40 year old female Height:      67 inches (170.18 cm) Weight:      146.25 pounds (66.48 kg) O2 Sat:      98 % on Room air Temp:     97.5 degrees F (36.39 degrees C) oral Pulse rate:   74 / minute BP sitting:   112 / 60  (left arm) Cuff size:   regular  Vitals Entered By: Josph Macho CMA (February 16, 2009 9:02 AM)  O2 Flow:  Room air CC: 3 month follow up/ CF Is Patient Diabetic? Yes   CC:  3 month follow up/ CF.  History of Present Illness: pt states 3 days moderate itching in both eyes, but no associated visual loss. she brings a record of her cbg's which i have reviewed today.  she has frequent (almost daily) mild hypoglycemia at any time of day.  she says she takes approx 50 units/day, via her pump.    Current Medications (verified): 1)  Insulin Pump Ir2020   Kit (Insulin Infusion Pump) .... Change Q3d 2)  Geodon 60 Mg  Caps (Ziprasidone Hcl) .... Take 1 By Mouth Qd 3)  Lamictal 150 Mg  Tabs (Lamotrigine) .... Take 1 By Mouth Qd 4)  Tegretol Xr 200 Mg  Tb12 (Carbamazepine) .... Take 2 Qam By Mouth Qd 5)  Deltec Cozmo Cleo Set 24" 6mm   Misc (Insulin Infusion Pump Supplies) .... Size of Patient's Preference, With Cartridge, Change Q3d 6)  Humalog 100 Unit/ml  Soln (Insulin Lispro (Human)) .... For Use in Pump, Total 70 Units/day 7)  Crestor 40 Mg Tabs (Rosuvastatin Calcium) .Marland Kitchen.. 1 Qhs  Allergies (verified): No Known Drug Allergies  Past History:  Past Medical History: Last updated: 08/28/2007 Smoker DM Retinopathy Decrease Platelets CONTUSION, ANKLE (ICD-924.21) SEBACEOUS CYST, INFECTED (ICD-706.2) ROUTINE GENERAL MEDICAL EXAM@HEALTH  CARE FACL (ICD-V70.0) HEPATOTOXICITY, DRUG-INDUCED, RISK OF (ICD-V58.69) TINEA VERSICOLOR (ICD-111.0) COUGH (ICD-786.2) ALLERGIC RHINITIS CAUSE UNSPECIFIED (ICD-477.9) MIGRAINE, CHRONIC (ICD-346.90) DIABETES MELLITUS, TYPE I (ICD-250.01) DEPRESSION  (ICD-311)  Review of Systems  The patient denies fever and vision loss.    Physical Exam  General:  normal appearance.   Eyes:  there is bilateral conjuctival injection.  no drainage.  lida and periorbital areas are normal. Additional Exam:  Hemoglobin A1C            6.2 %    Impression & Recommendations:  Problem # 1:  DIABETES MELLITUS, TYPE I (ICD-250.01) slightly overcontrolled  Problem # 2:  conjunctivitis usually viral  Medications Added to Medication List This Visit: 1)  Gentamicin Sulfate 0.3 % Soln (Gentamicin sulfate) .... 2 drops each eye tid  Other Orders: Est. Patient Level IV ([address])  Patient Instructions: 1)  reduce basal rate to 0.8 units/hr, except for 0.9 units/hr, 3 am to 6 am 2)  continue mealtime bolus to 1 unit/ 11 grams carbohydrate 3)  continue correction bolus (which some people call "sensitivity," or "insulin sensitivity ratio," or just "isr") of 1 unit for each 50 by which your glucose exceeds 100 4)  Please schedule a follow-up appointment in 3 months, with a1c prior 250.01. 5)  gentamycin eye drops 2 in each eye three times a day. 6)  you should also use a  non-prescription drop such as "zatidor," as needed for itching of the eyes. Prescriptions: GENTAMICIN SULFATE 0.3 % SOLN (GENTAMICIN SULFATE) 2 drops each eye tid  #20 cc x 1  Entered and Authorized by:   Minus Breeding MD   Signed by:   Minus Breeding MD on 02/16/2009   Method used:   [facility]  [address] [id number]       [address]       [place], [place]  [address]       Ph: [phone] or [phone]       Fax: [phone]   RxID:   [id number]

## 2010-02-25 NOTE — Assessment & Plan Note (Signed)
Summary: 3 MTH PHYSICAL  STC   Vital Signs:  Patient profile:   40 year old female Height:      67 inches (170.18 cm) Weight:      147.50 pounds (67.05 kg) BMI:     23.19 O2 Sat:      97 % on Room air Temp:     98.8 degrees F (37.11 degrees C) oral Pulse rate:   77 / minute BP sitting:   106 / 64  (left arm) Cuff size:   regular  Vitals Entered By: Brenton Grills MA (August 11, 2009 8:46 AM)  O2 Flow:  Room air CC: 3 mo physical/Pt is no longer using Gentamicin Eyedrops/aj Is Patient Diabetic? Yes   CC:  3 mo physical/Pt is no longer using Gentamicin Eyedrops/aj.  History of Present Illness: here for regular wellness examination.  she's feeling pretty well in general, and does not drink etoh.    Current Medications (verified): 1)  Insulin Pump Ir2020   Kit (Insulin Infusion Pump) .... Change Q3d 2)  Geodon 60 Mg  Caps (Ziprasidone Hcl) .... Take 1 By Mouth Qd 3)  Lamictal 150 Mg  Tabs (Lamotrigine) .... Take 1 By Mouth Qd 4)  Tegretol Xr 200 Mg  Tb12 (Carbamazepine) .... Take 2 Qam By Mouth Qd 5)  Deltec Cozmo Cleo Set 24" 6mm   Misc (Insulin Infusion Pump Supplies) .... Size of Patient's Preference, With Cartridge, Change Q3d 6)  Humalog 100 Unit/ml  Soln (Insulin Lispro (Human)) .... For Use in Pump, Total 70 Units/day 7)  Crestor 40 Mg Tabs (Rosuvastatin Calcium) .Marland Kitchen.. 1 Qhs 8)  Gentamicin Sulfate 0.3 % Soln (Gentamicin Sulfate) .... 2 Drops Each Eye Tid  Allergies (verified): 1)  ! Chantix (Varenicline Tartrate)  Family History: Reviewed history from 05/07/2008 and no changes required. no cancer father has dm (oral agents)  Social History: Reviewed history from 05/07/2008 and no changes required. married does not work outside the home  Review of Systems  The patient denies fever, weight loss, weight gain, vision loss, decreased hearing, chest pain, dyspnea on exertion, prolonged cough, headaches, abdominal pain, melena, severe indigestion/heartburn, and suspicious  skin lesions.         anxiety is well-controlled  Physical Exam  General:  normal appearance.   Head:  head: no deformity eyes: no periorbital swelling, no proptosis external nose and ears are normal mouth: no lesion seen Neck:  Supple without thyroid enlargement or tenderness.  Breasts:  sees gyn  Lungs:  Clear to auscultation bilaterally. Normal respiratory effort.  Heart:  Regular rate and rhythm without murmurs or gallops noted. Normal S1,S2.   Abdomen:  abdomen is soft, nontender.  no hepatosplenomegaly.   not distended.  no hernia  Rectal:  sees gyn  Genitalia:  sees gyn  Msk:  muscle bulk and strength are grossly normal.  no obvious joint swelling.  gait is normal and steady  Neurologic:  cn 2-12 grossly intact.   readily moves all 4's.   sensation is intact to touch on the feet  Skin:  insulin infusion sites at anterior abdomen are normal there [place] a chronic diffuse hyperpigmented rash on the back.  Cervical Nodes:  No significant adenopathy.  Psych:  Alert and cooperative; normal mood and affect; normal attention span and concentration.   Additional Exam:  SEPARATE EVALUATION FOLLOWS--EACH PROBLEM HERE IS NEW, NOT RESPONDING TO TREATMENT, OR POSES SIGNIFICANT RISK TO THE PATIENT'S HEALTH: HISTORY OF THE PRESENT ILLNESS: the status of at least 3  ongoing medical problems is addressed today: thrombocytopenia:  no brbpr.  she did not go to see the hematologist. hematuria:  pt says she was on menstrual period the day of her recent ua dm:  no cbg record, but states cbg's are sometimes low after lunch, when she has mild sxs--she drinks coffee, and feels better. PAST MEDICAL HISTORY reviewed and up to date today REVIEW OF SYSTEMS: denies loc and gross hematuria PHYSICAL EXAMINATION: no deformity.  no ulcer on the feet.  feet are of normal color and temp.  no edema dorsalis pedis intact bilat.  no carotid bruit skin:  no ecchymoses. LAB/XRAY RESULTS: cbc is  noted a1c=6.4 repeat ua is normal IMPRESSION: thrombocytopenia, persistent dm, slightly overcontrolled pseudohematuria, resolved PLAN: see instruction sheet   Impression & Recommendations:  Problem # 1:  ROUTINE GENERAL MEDICAL EXAM@HEALTH  CARE FACL (ICD-V70.0)  Other Orders: EKG w/ Interpretation (93000) TLB-Udip w/ Micro (81001-URINE) Est. Patient Level IV (65784) Est. Patient 18-39 years (69629)  Patient Instructions: 1)  continue basal rate of 1 unit/hr, except for 0.9 units/hr, 3 am to 6 am 2)  continue mealtime bolus to 1 unit/ 11 grams carbohydrate.  however, subtract 1 unit from calculated lunch bolus. 3)  continue correction bolus (which some people call "sensitivity," or "insulin sensitivity ratio," or just "isr") of 1 unit for each 50 by which your glucose exceeds 100 4)  we'll continue to follow the platelets.  let me know if you decide to see the specialist.   5)  recheck urine test today. 6)  please consider these measures for your health:  minimize alcohol.  do not use tobacco products.  have a colonoscopy at least every 10 years from age 61.  keep firearms safely stored.  always use seat belts.  have working smoke alarms in your home.  see the dentist regularly.  never drive under the influence of alcohol or drugs (including prescription drugs).  those with fair skin should take precautions against the sun. 7)  (update: i left message on phone-tree:  hematuria is resolved.  no more w/u is needed).

## 2010-02-25 NOTE — Progress Notes (Signed)
Summary: Insulin order  Phone Note Call from Patient   Caller: Patient [phone] Summary of Call: Patient called [facility] stating that her insulin pump has arrived but the nurse cannot set up w/o and faxed order that states "its ok for her to have set based off of old baso rates". Pt states that she can be contacted to give fax number, she is also going out of town on 12/23/09. Thanks Initial call taken by: Rock Nephew CMA,  December 21, 2009 10:52 AM  Follow-up for Phone Call        i printed rx Follow-up by: Minus Breeding MD,  December 21, 2009 1:56 PM  Additional Follow-up for Phone Call Additional follow up Details #1::        Notified pt md printed order. Need fax #. Sending to Cristy Folks, fax # [phone] Additional Follow-up by: Orlan Leavens RMA,  December 21, 2009 2:08 PM    New/Updated Medications: * PUMP SETTINGS: basal: 1 unit/hr, exept 0.9 units/hr, 3am-6am bolus: 1 unit/11 grams cho sensitivity: 1 unit for each 50 over 100 Prescriptions: PUMP SETTINGS: basal: 1 unit/hr, exept 0.9 units/hr, 3am-6am bolus: 1 unit/11 grams cho sensitivity: 1 unit for each 50 over 100  #0 x 0   Entered and Authorized by:   Minus Breeding MD   Signed by:   Minus Breeding MD on 12/21/2009   Method used:   Print then Give to Patient   RxID:   [id number]

## 2010-02-25 NOTE — Letter (Signed)
Summary: [facility]  [facility]   Imported By: Lennie Odor 01/08/2010 14:10:54  _____________________________________________________________________  External Attachment:    Type:   Image     Comment:   External Document

## 2010-02-25 NOTE — Assessment & Plan Note (Signed)
Summary: problems w/insulin pump/#/cd   Vital Signs:  Patient profile:   40 year old female Height:      67 inches (170.18 cm) Weight:      147.38 pounds (66.99 kg) BMI:     23.17 O2 Sat:      99 % on Room air Temp:     98.9 degrees F (37.17 degrees C) oral Pulse rate:   77 / minute BP sitting:   118 / 76  (left arm) Cuff size:   regular  Vitals Entered By: Brenton Grills CMA Duncan Dull) (December 03, 2009 9:44 AM)  O2 Flow:  Room air CC: Problems with insulin pump/pt is no longer taking Humalog insulin/aj Is Patient Diabetic? Yes   CC:  Problems with insulin pump/pt is no longer taking Humalog insulin/aj.  History of Present Illness: for the past 8 weeks, pt has been having intermitent problems with cbg's intermittently increasing to approx 400.  she has assoc n/v.  she says the pump (out of warranty) is intermittently not functioning, but mfgr said to pt by phone it seemed to be working well.  she was hospitalized, and was changed back to multiple daily injections for now.  she brings a record of her cbg's which i have reviewed today.  it varies from 50-200.  Current Medications (verified): 1)  Insulin Pump Ir2020   Kit (Insulin Infusion Pump) .... Change Q3d 2)  Geodon 60 Mg  Caps (Ziprasidone Hcl) .... Take 2 By Mouth At Bedtime 3)  Lamictal 200 Mg Tabs (Lamotrigine) .Marland Kitchen.. 1 By Mouth Once Daily 4)  Tegretol Xr 200 Mg  Tb12 (Carbamazepine) .... Take 2 Qam By Mouth Qd 5)  Deltec Cozmo Cleo Set 24" 6mm   Misc (Insulin Infusion Pump Supplies) .... Size of Patient's Preference, With Cartridge, Change Q3d 6)  Humalog 100 Unit/ml  Soln (Insulin Lispro (Human)) .... For Use in Pump, Total 70 Units/day 7)  Crestor 40 Mg Tabs (Rosuvastatin Calcium) .Marland Kitchen.. 1 Qhs 8)  Lantus 100 Unit/ml Soln (Insulin Glargine) .... 25 Units Daily 9)  Novolog 100 Unit/ml Soln (Insulin Aspart) .... Sliding Scale  Allergies (verified): 1)  ! Chantix (Varenicline Tartrate)  Past History:  Past Medical  History: Last updated: 08/28/2007 Smoker DM Retinopathy Decrease Platelets CONTUSION, ANKLE (ICD-924.21) SEBACEOUS CYST, INFECTED (ICD-706.2) ROUTINE GENERAL MEDICAL EXAM@HEALTH  CARE FACL (ICD-V70.0) HEPATOTOXICITY, DRUG-INDUCED, RISK OF (ICD-V58.69) TINEA VERSICOLOR (ICD-111.0) COUGH (ICD-786.2) ALLERGIC RHINITIS CAUSE UNSPECIFIED (ICD-477.9) MIGRAINE, CHRONIC (ICD-346.90) DIABETES MELLITUS, TYPE I (ICD-250.01) DEPRESSION (ICD-311)  Review of Systems  The patient denies syncope.    Physical Exam  General:  normal appearance.   Skin:  infusion sites (anterior abdomen) are without lesions.     Impression & Recommendations:  Problem # 1:  DIABETES MELLITUS, TYPE I (ICD-250.01) rx limited by pump problem  Medications Added to Medication List This Visit: 1)  Geodon 60 Mg Caps (Ziprasidone hcl) .... Take 2 by mouth at bedtime 2)  Lamictal 200 Mg Tabs (Lamotrigine) .Marland Kitchen.. 1 by mouth once daily 3)  Lantus 100 Unit/ml Soln (Insulin glargine) .... 25 units daily 4)  Novolog 100 Unit/ml Soln (Insulin aspart) .... Sliding scale, total 40 units/day 5)  Freestyle Lite Test Strp (Glucose blood) .... 8/day, and lancets 250.01 6)  Insulin Syringe 29g X 1/2" 0.5 Ml Misc (Insulin syringe-needle u-100) .... 6/day  Other Orders: Est. Patient Level III (51761)  Patient Instructions: 1)  continue lantus 25 units at bedtime 2)  change novolog to: 3)  1 unit/10 grams carbohydrate, and: 4)  take 5 extra to your calculated breakfast injectio, and: 5)  take 1 extra unit of novolog for each 40 by which yur blood sugar exceeds 100. 6)  please see linda spagnola to get set up with a new pump.  i would be happy to complete the forms for this.   Prescriptions: INSULIN SYRINGE 29G X 1/2" 0.5 ML MISC (INSULIN SYRINGE-NEEDLE U-100) 6/day  #180 x 11   Entered and Authorized by:   Minus Breeding MD   Signed by:   Minus Breeding MD on 12/03/2009   Method used:   [facility]  [address] [id number]       [address]       [place], [place]  [address]       Ph: [phone] or [phone]       Fax: [phone]   RxID:   [id number] LANTUS 100 UNIT/ML SOLN (INSULIN GLARGINE) 25 units daily  #1 vial x 11   Entered and Authorized by:   Minus Breeding MD   Signed by:   Minus Breeding MD on 12/03/2009   Method used:   [facility]  [address] [id number]       [address]       [place], [place]  [address]       Ph: [phone] or [phone]       Fax: [phone]   RxID:   [id number] NOVOLOG 100 UNIT/ML SOLN (INSULIN ASPART) sliding scale, total 40 units/day  #2 vials x 11   Entered and Authorized by:   Minus Breeding MD   Signed by:   Minus Breeding MD on 12/03/2009   Method used:   [facility]ically [facility]        [facility]  [address] [id number]       [address]       [place], [place]  [address]       Ph: [phone] or [phone]       Fax: [phone]   RxID:   [id number] FREESTYLE LITE TEST  STRP (GLUCOSE BLOOD) 8/day, and lancets 250.01  #240 x 11   Entered and Authorized by:   Minus Breeding MD   Signed by:   Minus Breeding MD on 12/03/2009   Method used:   [facility]ronically [facility]        [facility]  [address] [id number]       [address]       [place], [place]  [address]       Ph: [phone] or [phone]       Fax: [phone]   RxID:   [id number]    Orders Added: 1)  Est. Patient Level III [19622]

## 2010-02-25 NOTE — Progress Notes (Signed)
  Phone Note Call from Patient Call back at Home Phone [phone]   Caller: Patient Summary of Call: [employer] is sending papers to fill out to expedite her getting her pump. Pt wanted MD aware. Initial call taken by: Verdell Face,  December 11, 2009 3:25 PM

## 2010-02-25 NOTE — Assessment & Plan Note (Signed)
Summary: 3 MONTH FOLLOW UP-LB   Vital Signs:  Patient profile:   40 year old female Height:      67 inches (170.18 cm) Weight:      146 pounds (66.36 kg) BMI:     22.95 O2 Sat:      98 % on Room air Temp:     99.0 degrees F (37.22 degrees C) oral Pulse rate:   86 / minute BP sitting:   112 / 64  (left arm) Cuff size:   regular  Vitals Entered By: Josph Macho RMA (May 19, 2009 8:42 AM)  O2 Flow:  Room air CC: 3 month follow up/ pt states she is no longer taking Lamictal or Gentamicin/ CF Is Patient Diabetic? Yes   CC:  3 month follow up/ pt states she is no longer taking Lamictal or Gentamicin/ CF.  History of Present Illness: pt has increased her basal rate to 1 unit/hr, except for 0.9 units/he, 3 am-7:30 am.  no cbg record, but states cbg's are low approx 1/week, usually before lunch, or in the afternoon.  she says she is not active enough for activity to cause hypoglycemia.  she says her mealtime boluses are approx 2-8 units.  Current Medications (verified): 1)  Insulin Pump Ir2020   Kit (Insulin Infusion Pump) .... Change Q3d 2)  Geodon 60 Mg  Caps (Ziprasidone Hcl) .... Take 1 By Mouth Qd 3)  Lamictal 150 Mg  Tabs (Lamotrigine) .... Take 1 By Mouth Qd 4)  Tegretol Xr 200 Mg  Tb12 (Carbamazepine) .... Take 2 Qam By Mouth Qd 5)  Deltec Cozmo Cleo Set 24" 6mm   Misc (Insulin Infusion Pump Supplies) .... Size of Patient's Preference, With Cartridge, Change Q3d 6)  Humalog 100 Unit/ml  Soln (Insulin Lispro (Human)) .... For Use in Pump, Total 70 Units/day 7)  Crestor 40 Mg Tabs (Rosuvastatin Calcium) .Marland Kitchen.. 1 Qhs 8)  Gentamicin Sulfate 0.3 % Soln (Gentamicin Sulfate) .... 2 Drops Each Eye Tid  Allergies (verified): No Known Drug Allergies  Past History:  Past Medical History: Last updated: 08/28/2007 Smoker DM Retinopathy Decrease Platelets CONTUSION, ANKLE (ICD-924.21) SEBACEOUS CYST, INFECTED (ICD-706.2) ROUTINE GENERAL MEDICAL EXAM@HEALTH  CARE FACL  (ICD-V70.0) HEPATOTOXICITY, DRUG-INDUCED, RISK OF (ICD-V58.69) TINEA VERSICOLOR (ICD-111.0) COUGH (ICD-786.2) ALLERGIC RHINITIS CAUSE UNSPECIFIED (ICD-477.9) MIGRAINE, CHRONIC (ICD-346.90) DIABETES MELLITUS, TYPE I (ICD-250.01) DEPRESSION (ICD-311)  Review of Systems  The patient denies syncope.    Physical Exam  General:  normal appearance.   Pulses:  dorsalis pedis intact bilat.   Extremities:  no deformity.  no ulcer on the feet.  feet are of normal color and temp.  no edema  Neurologic:  sensation is intact to touch on the feet.  Additional Exam:  a1c=6.8   Impression & Recommendations:  Problem # 1:  DIABETES MELLITUS, TYPE I (ICD-250.01) it is unlikely that she requires more basal insulin than mealtime bolus  HgbA1C: 6.7 (05/07/2009) Cholesterol: 166 (07/29/2008)   HDL: 62.00 (07/29/2008)   LDL: 93 (07/29/2008)   Triglycerides: 55.0 (07/29/2008) Creatinine: 0.7 (04/24/2008)  Other Orders: Est. Patient Level III ([address])  Patient Instructions: 1)  continue basal rate of 1 unit/hr, except for 0.9 units/hr, 3 am to 6 am 2)  continue mealtime bolus to 1 unit/ 11 grams carbohydrate 3)  continue correction bolus (which some people call "sensitivity," or "insulin sensitivity ratio," or just "isr") of 1 unit for each 50 by which your glucose exceeds 100 4)  Please schedule a physical appointment in 3 months, with a1c  and microalbimun prior 250.01. 5)  please consider a continuous glucose monitor.  let me know if you want to go back to [facility], to get set up with this.   Prescriptions: DELTEC COZMO CLEO SET 24"   MISC (INSULIN INFUSION PUMP SUPPLIES) size of patient's preference, with cartridge, change q3d  #30 x 3   Entered and Authorized by:   Minus Breeding MD   Signed by:   Minus Breeding MD on 05/19/2009   Method used:   Print then Give to Patient   RxID:   [id number] CRESTOR 40 MG TABS (ROSUVASTATIN CALCIUM) 1 qhs  #90 x 3   Entered and  Authorized by:   Minus Breeding MD   Signed by:   Minus Breeding MD on 05/19/2009   Method used:   [facility]  [id number]       [address]       [place], [place]  [address]       Ph: [phone] or [phone]       Fax: [phone]   RxID:   [id number]

## 2010-02-25 NOTE — Letter (Signed)
Summary: CMN for Replacement Pump/[employer]  CMN for Replacement Pump/[employer]   Imported By: Sherian Rein 12/16/2009 10:41:00  _____________________________________________________________________  External Attachment:    Type:   Image     Comment:   External Document

## 2010-02-25 NOTE — Assessment & Plan Note (Signed)
Summary: fu Molly [name]   Vital Signs:  Patient profile:   40 year old female Height:      67 inches (170.18 cm) Weight:      152.38 pounds (69.26 kg) BMI:     23.95 O2 Sat:      99 % on Room air Temp:     99.2 degrees F (37.33 degrees C) oral Pulse rate:   74 / minute BP sitting:   116 / 70  (left arm) Cuff size:   regular  Vitals Entered By: Brenton Grills CMA Duncan Dull) (January 11, 2010 8:57 AM)  O2 Flow:  Room air CC: Follow up/paperwork (DMV forms)/pt is no longer taking Lantus, Novolog, or using Insulin needles/aj Is Patient Diabetic? Yes   CC:  Follow up/paperwork (DMV forms)/pt is no longer taking Lantus, Novolog, and or using Insulin needles/aj.  History of Present Illness: pt now has a [employer] pump.  pt states she feels well in general.  no cbg record, but states cbg's are well-controlled.  Current Medications (verified): 1)  Insulin Pump Ir2020   Kit (Insulin Infusion Pump) .... Change Q3d 2)  Geodon 60 Mg  Caps (Ziprasidone Hcl) .... Take 2 By Mouth At Bedtime 3)  Lamictal 200 Mg Tabs (Lamotrigine) .Marland Kitchen.. 1 By Mouth Once Daily 4)  Tegretol Xr 200 Mg  Tb12 (Carbamazepine) .... Take 2 Qam By Mouth Qd 5)  Deltec Cozmo Cleo Set 24" 6mm   Misc (Insulin Infusion Pump Supplies) .... Size of Patient's Preference, With Cartridge, Change Q3d 6)  Humalog 100 Unit/ml  Soln (Insulin Lispro (Human)) .... For Use in Pump, Total 70 Units/day 7)  Crestor 40 Mg Tabs (Rosuvastatin Calcium) .Marland Kitchen.. 1 Qhs 8)  Lantus 100 Unit/ml Soln (Insulin Glargine) .... 25 Units Daily 9)  Novolog 100 Unit/ml Soln (Insulin Aspart) .... Sliding Scale, Total 40 Units/day 10)  Freestyle Lite Test  Strp (Glucose Blood) .... 8/day, and Lancets 250.01 11)  Insulin Syringe 29g X 1/2" 0.5 Ml Misc (Insulin Syringe-Needle U-100) .... 6/day 12)  Pump Settings: .... Basal: 1 Unit/hr, Exept 0.9 Units/hr, 3am-6am Bolus: 1 Unit/11 Grams Cho Sensitivity: 1 Unit For Each 50 Over 100  Allergies (verified): 1)  ! Chantix  (Varenicline Tartrate)  Past History:  Past Medical History: Last updated: 08/28/2007 Smoker DM Retinopathy Decrease Platelets CONTUSION, ANKLE (ICD-924.21) SEBACEOUS CYST, INFECTED (ICD-706.2) ROUTINE GENERAL MEDICAL EXAM@HEALTH  CARE FACL (ICD-V70.0) HEPATOTOXICITY, DRUG-INDUCED, RISK OF (ICD-V58.69) TINEA VERSICOLOR (ICD-111.0) COUGH (ICD-786.2) ALLERGIC RHINITIS CAUSE UNSPECIFIED (ICD-477.9) MIGRAINE, CHRONIC (ICD-346.90) DIABETES MELLITUS, TYPE I (ICD-250.01) DEPRESSION (ICD-311)  Review of Systems  The patient denies hypoglycemia.    Physical Exam  General:  normal appearance.   Skin:  infusion sites at anterior abdomen are without lesions.     Impression & Recommendations:  Problem # 1:  DIABETES MELLITUS, TYPE I (ICD-250.01) apparently well-controlled  Medications Added to Medication List This Visit: 1)  Paradigm Quick-set 43" 6mm Misc (Insulin infusion pump supplies) .... Change every 3 days 2)  Paradigm Pump Reservoir 3ml Misc (Insulin infusion pump supplies) .... Change every 3 days 3)  Opsite Iv 3000 Misc (Transparent dressings) .... Change every 3 days  Other Orders: Est. Patient Level III ([address])  Patient Instructions: 1)  continue basal rate of 1 unit/hr, except for 0.9 units/hr, 3 am to 6 am 2)  continue mealtime bolus to 1 unit/ 11 grams carbohydrate.  however, subtract 1 unit from calculated lunch bolus. 3)  continue correction bolus (which some people call "sensitivity," or "insulin sensitivity ratio," or just "  isr") of 1 unit for each 50 by which your glucose exceeds 100. 4)  Please schedule a follow-up appointment in 2 months. Prescriptions: HUMALOG 100 UNIT/ML  SOLN (INSULIN LISPRO (HUMAN)) for use in pump, total 70 units/day  #1 vial x 3   Entered and Authorized by:   Minus Breeding MD   Signed by:   Minus Breeding MD on 01/11/2010   Method used:   Print then Give to Patient   RxID:   [id number] HUMALOG 100 UNIT/ML  SOLN (INSULIN  LISPRO (HUMAN)) for use in pump, total 70 units/day  #1 vial x 3   Entered and Authorized by:   Minus Breeding MD   Signed by:   Minus Breeding MD on 01/11/2010   Method used:   Print then Give to Patient   RxID:   [id number] OPSITE IV 3000  MISC (TRANSPARENT DRESSINGS) change every 3 days  #10 x 3   Entered and Authorized by:   Minus Breeding MD   Signed by:   Minus Breeding MD on 01/11/2010   Method used:   Print then Give to Patient   RxID:   [id number] PARADIGM PUMP RESERVOIR  MISC (INSULIN INFUSION PUMP SUPPLIES) change every 3 days  #10 x 3   Entered and Authorized by:   Minus Breeding MD   Signed by:   Minus Breeding MD on 01/11/2010   Method used:   Print then Give to Patient   RxID:   [id number] PARADIGM QUICK-SET 43"  MISC (INSULIN INFUSION PUMP SUPPLIES) change every 3 days  #10 x 3   Entered and Authorized by:   Minus Breeding MD   Signed by:   Minus Breeding MD on 01/11/2010   Method used:   Print then Give to Patient   RxID:   [id number] FREESTYLE LITE TEST  STRP (GLUCOSE BLOOD) 8/day, and lancets 250.01  #720 x 3   Entered and Authorized by:   Minus Breeding MD   Signed by:   Minus Breeding MD on 01/11/2010   Method used:   Print then Give to Patient   RxID:   [id number] PARADIGM PUMP RESERVOIR  MISC (INSULIN INFUSION PUMP SUPPLIES) change every 3 days  #10 x 3   Entered and Authorized by:   Minus Breeding MD   Signed by:   Minus Breeding MD on 01/11/2010   Method used:   Print then Give to Patient   RxID:   [id number] PARADIGM QUICK-SET 43"  MISC (INSULIN INFUSION PUMP SUPPLIES) change every 3 days  #10 x 3   Entered and Authorized by:   Minus Breeding MD   Signed by:   Minus Breeding MD on 01/11/2010   Method used:   Print then Give to Patient   RxID:   [id number]    Orders Added: 1)  Est. Patient Level III [[address]]

## 2010-03-04 ENCOUNTER — Other Ambulatory Visit: Payer: BC Managed Care – PPO

## 2010-03-04 ENCOUNTER — Encounter (INDEPENDENT_AMBULATORY_CARE_PROVIDER_SITE_OTHER): Payer: Self-pay | Admitting: *Deleted

## 2010-03-04 ENCOUNTER — Other Ambulatory Visit: Payer: Self-pay | Admitting: Endocrinology

## 2010-03-04 DIAGNOSIS — E109 Type 1 diabetes mellitus without complications: Secondary | ICD-10-CM

## 2010-03-05 ENCOUNTER — Ambulatory Visit (INDEPENDENT_AMBULATORY_CARE_PROVIDER_SITE_OTHER): Payer: BC Managed Care – PPO | Admitting: Gynecology

## 2010-03-05 DIAGNOSIS — L03319 Cellulitis of trunk, unspecified: Secondary | ICD-10-CM

## 2010-03-05 DIAGNOSIS — L02219 Cutaneous abscess of trunk, unspecified: Secondary | ICD-10-CM

## 2010-03-09 ENCOUNTER — Ambulatory Visit (INDEPENDENT_AMBULATORY_CARE_PROVIDER_SITE_OTHER): Payer: BC Managed Care – PPO | Admitting: Gynecology

## 2010-03-09 DIAGNOSIS — L03319 Cellulitis of trunk, unspecified: Secondary | ICD-10-CM

## 2010-03-09 DIAGNOSIS — L02219 Cutaneous abscess of trunk, unspecified: Secondary | ICD-10-CM

## 2010-03-16 ENCOUNTER — Encounter: Payer: Self-pay | Admitting: Endocrinology

## 2010-03-16 ENCOUNTER — Ambulatory Visit (INDEPENDENT_AMBULATORY_CARE_PROVIDER_SITE_OTHER): Payer: BC Managed Care – PPO | Admitting: Endocrinology

## 2010-03-16 DIAGNOSIS — E109 Type 1 diabetes mellitus without complications: Secondary | ICD-10-CM

## 2010-03-23 NOTE — Assessment & Plan Note (Signed)
Summary: 2 MO FU # CD   Vital Signs:  Patient profile:   40 year old female Height:      67 inches (170.18 cm) Weight:      153.38 pounds (69.72 kg) BMI:     24.11 O2 Sat:      97 % on Room air Temp:     98.6 degrees F (37.00 degrees C) oral Pulse rate:   80 / minute Pulse rhythm:   regular BP sitting:   116 / 64  (left arm) Cuff size:   regular  Vitals Entered By: Brenton Grills CMA Duncan Dull) (March 16, 2010 8:26 AM)  O2 Flow:  Room air CC: 2 month F/U/aj Is Patient Diabetic? Yes   CC:  2 month F/U/aj.  History of Present Illness: pt states he feels well in general.  she has hypoglycemia 3/week, mostly in the 60's, at any time of day.  she takes a total of 70 units of humalog per day, via the pump.  she feels the hypoglycemic episodes are due to miscalculating her mealtime bolus.    Current Medications (verified): 1)  Geodon 60 Mg  Caps (Ziprasidone Hcl) .... Take 2 By Mouth At Bedtime 2)  Lamictal 200 Mg Tabs (Lamotrigine) .Marland Kitchen.. 1 By Mouth Once Daily 3)  Tegretol Xr 200 Mg  Tb12 (Carbamazepine) .... Take 2 Qam By Mouth Qd 4)  Humalog 100 Unit/ml  Soln (Insulin Lispro (Human)) .... For Use in Pump, Total 70 Units/day 5)  Crestor 40 Mg Tabs (Rosuvastatin Calcium) .Marland Kitchen.. 1 Qhs 6)  Freestyle Lite Test  Strp (Glucose Blood) .... 8/day, and Lancets 250.01 7)  Paradigm Quick-Set 43" 6mm  Misc (Insulin Infusion Pump Supplies) .... Change Every 3 Days 8)  Paradigm Pump Reservoir 3ml  Misc (Insulin Infusion Pump Supplies) .... Change Every 3 Days 9)  Opsite Iv 3000  Misc (Transparent Dressings) .... Change Every 3 Days 10)  Freestyle Lancets  Misc (Lancets) .... Use As Directed  Allergies (verified): 1)  ! Chantix (Varenicline Tartrate)  Past History:  Past Medical History: Last updated: 08/28/2007 Smoker DM Retinopathy Decrease Platelets CONTUSION, ANKLE (ICD-924.21) SEBACEOUS CYST, INFECTED (ICD-706.2) ROUTINE GENERAL MEDICAL EXAM@HEALTH  CARE FACL  (ICD-V70.0) HEPATOTOXICITY, DRUG-INDUCED, RISK OF (ICD-V58.69) TINEA VERSICOLOR (ICD-111.0) COUGH (ICD-786.2) ALLERGIC RHINITIS CAUSE UNSPECIFIED (ICD-477.9) MIGRAINE, CHRONIC (ICD-346.90) DIABETES MELLITUS, TYPE I (ICD-250.01) DEPRESSION (ICD-311)  Review of Systems  The patient denies syncope.    Physical Exam  General:  Well developed, well nourished, in no acute distress.  Psych:  Alert and cooperative; normal mood and affect; normal attention span and concentration.     Impression & Recommendations:  Problem # 1:  DIABETES MELLITUS, TYPE I (ICD-250.01) HgbA1C: 6.6 (03/04/2010) slightly overcontrolled  Other Orders: Est. Patient Level III ([address])  Patient Instructions: 1)  continue basal rate of 1 unit/hr, except for 0.9 units/hr, 3 am to 6 am 2)  decrease mealtime bolus to 1 unit/ 12 grams carbohydrate.  however, subtract 1 unit from calculated lunch bolus. 3)  continue correction bolus (which some people call "sensitivity," or "insulin sensitivity ratio," or just "isr") of 1 unit for each 50 by which your glucose exceeds 100. 4)  Please schedule a follow-up appointment in 3 months, with a1c prior 250.01. Prescriptions: CRESTOR 40 MG TABS (ROSUVASTATIN CALCIUM) 1 qhs  #90 x 3   Entered and Authorized by:   Minus Breeding MD   Signed by:   Minus Breeding MD on 03/16/2010   Method used:   [facility]  [address] [id number]       [address]       [place], [place]  [address]       Ph: [phone] or [phone]       Fax: [phone]   RxID:   [id number] HUMALOG 100 UNIT/ML  SOLN (INSULIN LISPRO (HUMAN)) for use in pump, total 70 units/day  #8 vials x 3   Entered and Authorized by:   Minus Breeding MD   Signed by:   Minus Breeding MD on 03/16/2010   Method used:   [facility]        [facility]  [address] [id number]       [address]       [place], [place]  [address]       Ph:  [phone] or [phone]       Fax: [phone]   RxID:   [id number]    Orders Added: 1)  Est. Patient Level III [phone]

## 2010-03-23 NOTE — Letter (Signed)
Summary: Generic Letter  [facility]  [address] [place], [place] [address]   Phone: [phone]  Fax: [phone]    03/16/2010  Molly [name] [address] [place], [place]  [address]  [place]  Dear [name],   This is to request coverage for your insulin pump supplies.  You have been on insulin pump therapy for 10 years, and it has worked well.  Your most recent hemoglobin a1c was 6.6%, and it would be unsafe for you to change to insulin injections.     Sincerely,   Romero Belling MD

## 2010-04-06 LAB — URINALYSIS, ROUTINE W REFLEX MICROSCOPIC
Leukocytes, UA: NEGATIVE
Nitrite: NEGATIVE
Specific Gravity, Urine: 1.038 — ABNORMAL HIGH (ref 1.005–1.030)
Urobilinogen, UA: 0.2 mg/dL (ref 0.0–1.0)

## 2010-04-06 LAB — GLUCOSE, CAPILLARY
Glucose-Capillary: 124 mg/dL — ABNORMAL HIGH (ref 70–99)
Glucose-Capillary: 142 mg/dL — ABNORMAL HIGH (ref 70–99)
Glucose-Capillary: 183 mg/dL — ABNORMAL HIGH (ref 70–99)
Glucose-Capillary: 220 mg/dL — ABNORMAL HIGH (ref 70–99)
Glucose-Capillary: 238 mg/dL — ABNORMAL HIGH (ref 70–99)
Glucose-Capillary: 239 mg/dL — ABNORMAL HIGH (ref 70–99)
Glucose-Capillary: 241 mg/dL — ABNORMAL HIGH (ref 70–99)

## 2010-04-06 LAB — CBC
HCT: 41.6 % (ref 36.0–46.0)
Hemoglobin: 12.1 g/dL (ref 12.0–15.0)
Hemoglobin: 14.2 g/dL (ref 12.0–15.0)
MCH: 33.5 pg (ref 26.0–34.0)
RBC: 3.62 MIL/uL — ABNORMAL LOW (ref 3.87–5.11)
RBC: 4.18 MIL/uL (ref 3.87–5.11)
RDW: 13.4 % (ref 11.5–15.5)
WBC: 9.5 10*3/uL (ref 4.0–10.5)

## 2010-04-06 LAB — LIPID PANEL
Cholesterol: 140 mg/dL (ref 0–200)
HDL: 65 mg/dL (ref >39)
LDL Cholesterol: 64 mg/dL (ref 0–99)
Triglycerides: 56 mg/dL (ref <150)

## 2010-04-06 LAB — COMPREHENSIVE METABOLIC PANEL
ALT: 16 U/L (ref 0–35)
CO2: 24 mEq/L (ref 19–32)
Calcium: 8.3 mg/dL — ABNORMAL LOW (ref 8.4–10.5)
Creatinine, Ser: 0.57 mg/dL (ref 0.4–1.2)
GFR calc non Af Amer: >60 mL/min (ref >60)
Glucose, Bld: 176 mg/dL — ABNORMAL HIGH (ref 70–99)
Sodium: 139 mEq/L (ref 135–145)
Total Bilirubin: 0.5 mg/dL (ref 0.3–1.2)

## 2010-04-06 LAB — CK TOTAL AND CKMB (NOT AT ARMC)
Relative Index: INVALID (ref 0.0–2.5)
Total CK: 75 U/L (ref 7–177)

## 2010-04-06 LAB — BLOOD GAS, VENOUS
Acid-base deficit: 1.3 mmol/L (ref 0.0–2.0)
Bicarbonate: 21.2 mEq/L (ref 20.0–24.0)
TCO2: 18.5 mmol/L (ref 0–100)
pCO2, Ven: 30.3 mmHg — ABNORMAL LOW (ref 45.0–50.0)
pO2, Ven: 48.6 mmHg — ABNORMAL HIGH (ref 30.0–45.0)

## 2010-04-06 LAB — DIFFERENTIAL
Basophils Absolute: 0 10*3/uL (ref 0.0–0.1)
Basophils Relative: 0 % (ref 0–1)
Monocytes Relative: 9 % (ref 3–12)
Neutro Abs: 5.2 10*3/uL (ref 1.7–7.7)
Neutrophils Relative %: 73 % (ref 43–77)

## 2010-04-06 LAB — TSH: TSH: 1.396 u[IU]/mL (ref 0.350–4.500)

## 2010-04-06 LAB — KETONES, QUALITATIVE

## 2010-04-06 LAB — BASIC METABOLIC PANEL
GFR calc Af Amer: >60 mL/min (ref >60)
GFR calc non Af Amer: >60 mL/min (ref >60)
Potassium: 4.3 mEq/L (ref 3.5–5.1)
Sodium: 138 mEq/L (ref 135–145)

## 2010-04-06 LAB — URINE MICROSCOPIC-ADD ON

## 2010-04-06 LAB — MAGNESIUM: Magnesium: 2.3 mg/dL (ref 1.5–2.5)

## 2010-04-06 LAB — HEMOGLOBIN A1C: Mean Plasma Glucose: 126 mg/dL — ABNORMAL HIGH (ref <117)

## 2010-04-14 ENCOUNTER — Ambulatory Visit (INDEPENDENT_AMBULATORY_CARE_PROVIDER_SITE_OTHER): Payer: BC Managed Care – PPO | Admitting: Gynecology

## 2010-04-14 DIAGNOSIS — N63 Unspecified lump in unspecified breast: Secondary | ICD-10-CM

## 2010-04-25 NOTE — Progress Notes (Signed)
  Subjective:    Patient ID: Molly [name], female    DOB: 10-21-1970, 40 y.o.   MRN: [id number]  HPI Pt tate 1 week of nasal congestion, prod-quality cough in the chest, and assoc purulent rhinorrhea.  No help with otc meds Past Medical History  Diagnosis Date  . SMOKER 08/11/2009  . CONTUSION, ANKLE 08/28/2007  . SEBACEOUS CYST, INFECTED 07/13/2007  . TINEA VERSICOLOR 07/13/2007  . ALLERGIC RHINITIS CAUSE UNSPECIFIED 03/06/2007  . MIGRAINE, CHRONIC 03/06/2007  . DIABETES MELLITUS, TYPE I 08/03/2006  . DEPRESSION 08/03/2006  . THROMBOCYTOPENIA 05/07/2008  . Encounter for long-term (current) use of other medications   . DM retinopathy    Past Surgical History  Procedure Date  . Abnormal u/s 12/10/1996  . Electrocardiogram 06/13/2006    reports that she has been smoking.  She does not have any smokeless tobacco history on file. Her alcohol and drug histories not on file. Married Does not work outside the home family history includes Diabetes in her father.  There is no history of Cancer. Allergies  Allergen Reactions  . Varenicline Tartrate     REACTION: rash     Review of Systems Denies fever and sob    Objective:   Physical Exam GENERAL: no distress head: no deformity eyes: no periorbital swelling, no proptosis external nose and ears are normal mouth: no lesion seen LUNGS:  Clear to auscultation       Assessment & Plan:  Glenford Peers, new

## 2010-04-26 ENCOUNTER — Ambulatory Visit (INDEPENDENT_AMBULATORY_CARE_PROVIDER_SITE_OTHER): Payer: BC Managed Care – PPO | Admitting: Endocrinology

## 2010-04-26 ENCOUNTER — Encounter: Payer: Self-pay | Admitting: Endocrinology

## 2010-04-26 VITALS — BP 122/66 | HR 80 | Temp 99.6°F | Ht 67.0 in | Wt 150.6 lb

## 2010-04-26 DIAGNOSIS — J069 Acute upper respiratory infection, unspecified: Secondary | ICD-10-CM

## 2010-04-26 MED ORDER — OPSITE IV 3000 MISC: 1.0000 | Status: DC

## 2010-04-26 MED ORDER — "PARADIGM QUICK-SET 43"" 6MM MISC": 1.0000 | Status: DC

## 2010-04-26 MED ORDER — GLUCOSE BLOOD VI STRP: ORAL_STRIP | Status: DC

## 2010-04-26 MED ORDER — PARADIGM PUMP RESERVOIR 3ML MISC: 1.0000 | Status: DC

## 2010-04-26 MED ORDER — FREESTYLE LANCETS MISC: Status: DC

## 2010-04-26 MED ORDER — CEFUROXIME AXETIL 250 MG PO TABS: 250.0000 mg | ORAL_TABLET | Freq: Two times a day (BID) | ORAL | Status: AC

## 2010-04-26 NOTE — Patient Instructions (Signed)
i have sent a prescription for an antibiotic to your pharmacy. For congestion, try loratadine-d (non-prescription)

## 2010-04-28 DIAGNOSIS — J069 Acute upper respiratory infection, unspecified: Secondary | ICD-10-CM | POA: Insufficient documentation

## 2010-06-08 ENCOUNTER — Other Ambulatory Visit: Payer: Self-pay | Admitting: Endocrinology

## 2010-06-08 ENCOUNTER — Other Ambulatory Visit: Payer: BC Managed Care – PPO

## 2010-06-08 DIAGNOSIS — E109 Type 1 diabetes mellitus without complications: Secondary | ICD-10-CM

## 2010-06-11 NOTE — Discharge Summary (Signed)
[name], [name]               ACCOUNT NO.:  [id number]   MEDICAL RECORD NO.:  [id number]          PATIENT TYPE:  INP   LOCATION:  9102                          FACILITY:  [facility]   PHYSICIAN:  Rica Koyanagi, C.N.M.DATE OF BIRTH:  30-Jan-1970   DATE OF ADMISSION:  02/11/2004  DATE OF DISCHARGE:  02/22/2004                                 DISCHARGE SUMMARY   Molly [name] is a 40 year old married Caucasian female.  She is gravida 4 para  0-1-2-1 who presents at 37-4/7 weeks with nausea and vomiting with  ketonuria, insulin-dependent diabetes, and thrombocytopenia.  She developed  a viral syndrome respiratory illness and fluid overload on day #4 of her  admission with diagnosis of pneumonia.  Her nausea and vomiting resolved.  She was placed on antibiotics for her pneumonia and diuresed.  She also  received transfusion of two units of platelets.  Her platelets went up to  119,000 following transfusion.  Her platelets came up to 99,000 following  transfusion, and she was okay for surgery.  Postoperatively, her platelets  went up to 119,000 and up to 150,000 on February 21, 2004.  Her hemoglobin  following surgery was 8.5 and dipped down to 6.9.  The patient does remain  asymptomatic from her anemia and on this her fourth postoperative day she is  judged to be in satisfactory condition for discharge.  Her antibiotics were  discontinued yesterday, and she has remained afebrile.  Her blood sugars  have been maintained via an insulin pump and have been monitored quite  closely with multiple changes.  She will continue to follow up with her  endocrinologist after discharge.  Her incision is clean and intact.  Her  blood work has been steadily stabilizing.  In light of these factors, the  patient is stable and in satisfactory condition for discharge.  Discharge  instructions per [facility] handout.   DISCHARGE MEDICATIONS:  She will continue using her albuterol inhaler as  needed and  her insulin pump as adjusted by her endocrinologist.  She will  use pain medicine, Motrin, and Percocet p.r.n.   DISCHARGE INSTRUCTIONS:  She will call for any shortness of breath or any  problems or concerns and follow up with the office of [facility] in one week.      SDM/MEDQ  D:  02/22/2004  T:  02/22/2004  Job:  [id number]

## 2010-06-11 NOTE — H&P (Signed)
Molly [name], Molly [name]               ACCOUNT NO.:  [id number]   MEDICAL RECORD NO.:  [id number]          PATIENT TYPE:  INP   LOCATION:  9156                          [facility]ILITY:  [facility]   PHYSICIAN:  Molly [name], M.D.DATE OF BIRTH:  Jun 01, 1970   DATE OF ADMISSION:  01/14/2004  DATE OF DISCHARGE:                                HISTORY & PHYSICAL   HISTORY OF PRESENT ILLNESS:  Ms. Molly [name] is a 40 year old, gravida 4, para 0-1-  2-1, who presents at 33-3/7th weeks.  EMERGENCY DEPARTMENT February 29, 2004  from the office of [facility] with hyperemesis, persistent nausea and vomiting  since Sunday.  She has been unable to keep down any food or fluids.  Zofran  and Protonix at home are ineffective.  Clean catch urine specimen at [facility]  found to be plus ketones and 2+ protein.  She is diabetic, juvenile onset,  and utilizing insulin pump for blood sugar management.  She denies any  cramping, contractions, or pain.  No vaginal bleeding.  No rupture of  membranes.  Denies any PIH symptoms.  No headaches, visual changes, or  epigastric pain.  She is to be admitted to [facility]  secondary to insulin dependent diabetes, nausea, and vomiting with  ketonuria.   Her pregnancy has been followed by the M.D. service at [facility] and is  remarkable for insulin-dependent diabetes with insulin pump, diabetic  retinopathy, smoker, history of preeclampsia, previous C-section, HSV II.   OBSTETRICAL HISTORY:  1.  In 1993, the patient had a first trimester SAB with no complications.  2.  In 1994, the patient had a first trimester SAB with no complications.  3.  In December of 2003, the patient had a primary low transverse cesarean      section with a birth of a 7 pound 15 ounce female infant named Molly [name].      The pregnancy has been complicated diabetes, severe preeclampsia,      thrombocytopenia, and the baby was breech.  4.  With her current pregnancy, she is planning repeat cesarean  delivery.   PREGNANCY HISTORY:  She was initially evaluated at the office of [facility] on  August 18, 2003 at [redacted] weeks gestation.  She has been followed closely with her  diabetes, nausea, and vomiting with pregnancy.  She has been admitted  several times-the last time being on December 22, 2003 for pneumonia as well  as nausea and vomiting.   PAST MEDICAL HISTORY:  1.  She reports having the usual childhood diseases.  2.  She had insulin-dependent diabetes which began in childhood.  3.  Her endocrinologist is Dr. Gregary Signs A. Everardo [name].  4.  She has a history of ulcers as a teenager.  5.  GERD currently with this pregnancy and she is taking Protonix.  6.  She has a history of depression and has been on Wellbutrin in the past      but not currently.   PAST SURGICAL HISTORY:  1.  Wisdom teeth abscess.  2.  A cyst in her groin.   FAMILY HISTORY:  Significant for maternal grandfather  and maternal  grandmother with heart disease.  Multiple grandparents, aunts, and uncles  with chronic hypertension.  A cousin with phlebitis during pregnancy.  Multiple family members also with adult onset diabetes.   GENETIC HISTORY:  Unremarkable.  There is no family history of familial or  chromsomal disorders, children that have died in infancy, or that were born  with birth defects.   PRENATAL LABORATORY DATA:  On September 02, 2003, hemoglobin and hematocrit 11.8  and 36.6, platelets 158,000.  Blood type is Rh A positive, antibody screen  negative.  VDRL nonreactive.  Rubella immuned.  Hepatitis B surface-antigen  negative.  HIV declined.  CF testing declined.  Today, her comprehensive  metabolic panel found sodium 138, potassium 4.0, chloride 104, carbon  dioxide 26, glucose 63, BUN 7, creatinine 0.6, calcium 9.0, total protein  6.3, albumin 3.1, SGOT 24, SGPT 20, alkaline phosphate 155, and bilirubin  1.3.  CBC shows WBC 9.5, RBC 4.27, hemoglobin 13.8, hematocrit 40.3, and  platelets 104,000.   ALLERGIES:  The  patient has no known drug allergies.   SOCIAL HISTORY:  She is a cigarette smoker but denies the use of alcohol or  illicit drugs.  She is currently utilizing an insulin pump to manage her  diabetes.  Her endocrinologist is Dr. Gregary Signs A. Everardo [name].  Molly [name] is  married to Molly [name].  He is involved and supportive.  They are both  employed and do not subscribe to a religious faith.   REVIEW OF SYMPTOMS:  As described above.  The patient is typical of one with  a uterine pregnancy at 33-3/7th weeks with hyperemesis, persistent nausea  and vomiting, ketonuria.   PHYSICAL EXAMINATION:  VITAL SIGNS:  Stable.  The patient is afebrile.  HEENT:  Unremarkable.  HEART:  Regular rate and rhythm.  LUNGS:  Clear bilaterally to auscultation.  ABDOMEN:  Gravid in its contour.  Uterine fundus is noted to extend 34 cm  above  the level of the pubic symphysis.  Fetal heart rate is 140s with  average long-term variability.  Reactivity is present with no periodic  changes.  The patient is having a few mild irregular contractions.  She does  not perceive these contractions.  EXTREMITIES:  No pathologic edema.  NEUROLOGICAL:  DTRs are 1+ with no clonus.   ASSESSMENT:  1.  Intrauterine pregnancy at 33-3/7th weeks.  2.  Hyperemesis with persistent nausea, vomiting, and ketonuria.  3.  Insulin-dependent diabetes.   PLAN:  Admit per Dr. Janine [name].  IV in OR for fluid bolus with 4 mg  of Zofran IV push and further orders as written.  The patient is in  agreement with plan to stay overnight for observation receiving IV fluids  and antiemetics.     Molly [name]   SDM/MEDQ  D:  01/14/2004  T:  01/14/2004  Job:  [id number]

## 2010-06-11 NOTE — H&P (Signed)
NAMESABAH, ZUCCO               ACCOUNT NO.:  [id number]   MEDICAL RECORD NO.:  [id number]          PATIENT TYPE:  INP   LOCATION:  9319                          [facility]ILITY:  [facility]   PHYSICIAN:  Sean A. Everardo All, M.D. [facility] OF BIRTH:  Jul 25, 1970   DATE OF ADMISSION:  12/16/2003  DATE OF DISCHARGE:                                HISTORY & PHYSICAL   REASON FOR ADMISSION:  Intractable vomiting.   HISTORY OF PRESENT ILLNESS:  The patient is a 40 year old woman with 4 days  of a productive quality cough, as well as pain at the left ear and nasal  congestion.  She has associated nausea and vomiting, diffuse myalgias, and  slight dizziness.   She was seen at [facility] yesterday and was given 2 L of intravenous  fluid, and she states I'm just not making it at home.   PAST MEDICAL HISTORY:  1.  She is now [redacted] weeks gestation.  2.  Type 1 diabetes for which she takes Humalog via constant subcutaneous      insulin infusion pump.  3.  Anxiety/depression.   MEDICATIONS:  1.  Humalog via the pump.  2.  Wellbutrin 150 mg daily.  3.  Protonix 40 mg daily.  4.  Ambien 10 mg q.h.s.  5.  Zofran 4 mg q.6h. as needed for nausea, which was prescribed yesterday,      but the patient has not yet picked up the prescription.   FAMILY HISTORY:  No one else at home is ill.   SOCIAL HISTORY:  She is married and works in Personnel officer.   REVIEW OF SYSTEMS:  She is uncertain if she has fever, but she denies  syncope, shortness of breath, and diarrhea.   PHYSICAL EXAMINATION:  VITAL SIGNS:  Blood pressure is 110/66, heart is 116,  temperature is 98.9, the weight is 196.  GENERAL:  Slight distress due to nausea.  SKIN:  Not diaphoretic.  HEENT:  Left tympanic membrane is very red, right tympanic membrane is  normal.  Pharynx:  Slight erythema.  NECK:  Supple, no goiter.  CHEST:  Clear to auscultation.  CARDIOVASCULAR:  There is no JVD, no edema, tachycardic rhythm, no murmur.  Pedal pulses  are intact.  ABDOMEN:  Gravid.  It is soft and nontender.  NEUROLOGIC:  Alert, well oriented.  Gait is observed in the office to be  normal.   LABORATORY STUDIES:  Last hemoglobin A1c on 2003/10/28:  Hemoglobin  A1c is 6.0.  Also, the patient reports that her home glucoses have ranged  from the low 100s to the low 200s for the past few days since she has been  ill.   IMPRESSION:  1.  Upper respiratory infection.  2.  Intractable vomiting, probably due to #1.  3.  Type 1 diabetes, chronically on an insulin pump.  4.  Pregnancy at 29 weeks.   PLAN:  1.  Admit to [facility].  2.  Intravenous fluid.  3.  Symptomatic therapy for her nausea and fever.  4.  Check laboratory studies including blood cultures.  5.  Rocephin 1000 mg daily.  6.  Will change her insulin to via the Glucomander with an aggressive      glycemic target.  7.  I called Dr. Su Hilt from [facility] and she or one of her      associates will check on the patient during her hospitalization.      SAE/MEDQ  D:  12/16/2003  T:  12/16/2003  Job:  [id number]   cc:   [facility]

## 2010-06-11 NOTE — Discharge Summary (Signed)
NAME:  Molly [name], Molly [name]                     ACCOUNT NO.:  [id number]   MEDICAL RECORD NO.:  [id number]                   PATIENT TYPE:  INP   LOCATION:  9111                                 [facility]   PHYSICIAN:  Osborn Coho, M.D.                DATE OF BIRTH:  27-Feb-1970   DATE OF ADMISSION:  01/01/2002  DATE OF DISCHARGE:  01/04/2002                                 DISCHARGE SUMMARY   ADMITTING DIAGNOSES:  1. Intrauterine pregnancy at 35 and 5/7 weeks.  2. Severe preeclampsia.  3. Insulin-dependent diabetes poorly controlled on pump.  4. History of herpes simplex virus.   DISCHARGE DIAGNOSES:  1. Intrauterine pregnancy at 35 and 5/7 weeks.  2. Severe preeclampsia.  3. Insulin-dependent diabetes poorly controlled on pump.  4. History of herpes simplex virus.   PROCEDURE:  1. Primary low transverse cesarean section.  2. General anesthesia.   HOSPITAL COURSE:  The patient is a 40 year old gravida 3, para 0-0-2-0 at 11  and 5/7 weeks by a last menstrual period, 36 weeks by 12 week ultrasound who  presented for admission on December 9 secondary to preeclampsia.  She also  had history remarkable for insulin-dependent diabetes on insulin pump with  poor control, depression on Wellbutrin, smoker, history of HSV, but no  current or recent lesions, history of migraines.  On admission blood  pressures were not elevated but patient did have significant proteinuria and  platelet count of 68 down from 73 on December 4.  She was admitted to the  [facility] for cesarean section.  Decision was made to  proceed with general anesthesia secondary to thrombocytopenia.  Cesarean  section was performed by Dr. Estanislado Pandy and Dr. Su Hilt.  A primary low  transverse cesarean section was performed for a viable female by the name of  Samantha.  Apgars were 8 and 9.  Weight was 7 pounds 15 ounces.  Infant was  taken to the full-term nursery.  Mother was taken to recovery  room in good  condition.  Dr. Harlow Asa office was consulted regarding management of  the patient's diabetes.  By postoperative day one patient was doing well.  She was bottle feeding.  Her physical examination was within normal limits.  Her incision was clean, dry, and intact.  She was on magnesium sulfate  secondary to preeclampsia.  CBGs were 95-159 on the insulin pump.  Baby was  doing well and remained in the regular nursery.  Her liver enzymes were  slightly elevated.  By postoperative day two she was doing well.  She was  resolving her preeclampsia.  Nutrition assessment consultation was  accomplished.  By postoperative day three she was ready to go home.  She had  a follow-up appointment with Dr. Everardo All.  Her vital signs were stable.  Her  fasting blood sugar was 145.  Her incision was clean, dry, and intact.  Her  infant was  doing well.  She was deemed to have received full benefit of her  hospital stay and was discharged home.   DISCHARGE INSTRUCTIONS:  Per [facility] handout.   DISCHARGE MEDICATIONS:  1. Motrin 600 mg p.o. q.6h. p.r.n. pain.  2. Tylox one to two p.o. q.3-4h. p.r.n. pain.   DISCHARGE FOLLOWUP:  Will occur in three weeks at [facility] for  blood pressure check and PIH laboratories.  The patient will call for an  appointment.  Then six week follow-up will occur.  The patient will also  follow up with Dr. George Hugh office as scheduled.     Renaldo Reel Emilee Hero, C.N.M.                   Osborn Coho, M.D.    VLL/MEDQ  D:  01/04/2002  T:  01/04/2002  Job:  [id number]

## 2010-06-11 NOTE — Op Note (Signed)
Molly [name], CADENAS               ACCOUNT NO.:  [id number]   MEDICAL RECORD NO.:  [id number]          PATIENT TYPE:  INP   LOCATION:  9373                          [facility]   PHYSICIAN:  Osborn Coho, M.D.   DATE OF BIRTH:  01/25/1970   DATE OF PROCEDURE:  02/19/2004  DATE OF DISCHARGE:                                 OPERATIVE REPORT   PREOPERATIVE DIAGNOSIS:  1.  Term intrauterine pregnancy.  2.  Type 1 diabetes.  3.  Thrombocytopenia.  4.  Fetal tachycardia.   POSTOPERATIVE DIAGNOSIS:  1.  Term intrauterine pregnancy.  2.  Type 1 diabetes.  3.  Thrombocytopenia.  4.  Fetal tachycardia.   PROCEDURE:  Repeat cesarean section and bilateral tubal ligation.   ANESTHESIA:  General.   SURGEON:  Osborn Coho, M.D.   ASSISTANT:  Naima A. Dillard, M.D.   FLUIDS REPLACED:  1600 mL.   ESTIMATED BLOOD LOSS:  1200 mL.   URINE OUTPUT:  125 mL.   COMPLICATIONS:  None.   FINDINGS:  Live female infant with Apgars of 8 at one minute and 9 at five  minutes.  Cord gases 7.25 arterial and 7.33 venous.  Placenta to pathology.   DESCRIPTION OF PROCEDURE:  The patient was taken to the operating room after  the risks, benefits, and alternatives were discussed with the patient.  The  patient related understanding and consent verified.  The patient was prepped  and draped in the usual sterile fashion and placed under general anesthesia  per the anesthesiologist.  A Pfannenstiel skin incision was made at the  prior scar site and carried down to the underlying layer of fascia with the  knife.  The fascia was excised bilaterally in the midline and extended  bilaterally with the Mayo scissors.  Kocher clamps were placed on the  inferior aspect of the fascial incision and the rectus muscle excised from  the fascia.  The same was done on the superior aspect of the fascial  incision.  The rectus muscle was separated in the midline and the peritoneum  entered bluntly and extended  manually.  The bladder blade was placed and  bladder flap created with the Metzenbaum scissors.  Uterine incision was  made with the scalpel and extended bilaterally with the bandage scissors.  The infant was delivered and cord clamped and cut and the infant handed to  the awaiting pediatricians.  The placenta was delivered via fundal massage.  The uterus was cleared of all clots and debris.  The uterine incision was  repaired with 0 Vicryl in a running locked fashion and a second imbricating  layer was performed.  A couple of figure-of-eights were needed for  hemostasis.  A hematoma was noted in the bladder flap and remained stable  throughout the rest of the procedure.  Attention was then turned to the left  adnexa and the ovary and fallopian tube appeared within normal limits.  The  fallopian tube was carried out to its fimbriated end and grasped in its  midportion and ligated with 0 plain x2.  The tube was then excised  with the  Metzenbaum scissors and the remaining pedicles cauterized with the Bovie.  Good hemostasis was noted.  The same was done of the right adnexa and right  fallopian tube after carrying out to its fimbriated end.  The intra-  abdominal cavity was irrigated.  The hematoma again in the bladder flap was  noted to be stable.  The peritoneum was closed with 3-0 chromic in a running  fashion.  The fascia was repaired with 0 Vicryl in a running fashion.  The  subcutaneous tissue was irrigated and made hemostatic with the Bovie.  0  plain was used to reapproximate the subcutaneous tissue and 3-0 Monocryl was  used to reapproximate the skin using a subcuticular stitch.  Steri-Strips  were applied and a pressure dressing applied as well.  The patient was  extubated without difficulty and is currently being returned to the recovery  room in stable condition.      AR/MEDQ  D:  02/19/2004  T:  02/19/2004  Job:  [id number]

## 2010-06-11 NOTE — Consult Note (Signed)
NAMEDAVIAN, WOLLENBERG               ACCOUNT NO.:  [id number]   MEDICAL RECORD NO.:  [id number]          PATIENT TYPE:  INP   LOCATION:  9156                          [facility]   PHYSICIAN:  Lajuana Matte, MD  DATE OF BIRTH:  [birth year]   DATE OF CONSULTATION:  01/15/2004  DATE OF DISCHARGE:                                   CONSULTATION   REFERRING PHYSICIAN:  Naima A. Normand Sloop, M.D.   REASON FOR CONSULTATION:  A 40 year old pregnant white female with  thrombocytopenia.   HISTORY:  Ms. Molly [name] is a 40 year old white female who is gravida 4, para 0-1-  2-1, and currently pregnant at her 22 week, who presented to the emergency  department in the [facility] complaining of persistent nausea and  vomiting x5 days before evaluation, and this was not responding to p.o.  Protonix and Zofran.  The patient was found after admission to have a low  platelet count of 94 on January 15, 2004.  Her prior platelet count over  the last two to three years showed that in December 2003 she had a platelet  count of 68 and she ended having C-section with general anesthesia because  of her low platelet count at that point.  She had recovery of her platelet  count.  On December 15, 2003, her platelet count was 119, December 23, 2003,  was 160, January 14, 2004, 104, and today it was 29.  The patient has been  on antacid medication for a long period with Protonix initially, but over  the last three months she was started on Zantac in addition to Protonix.  She did not receive any heparin products during this admission and, as  mentioned before, the patient had a similar episode of thrombocytopenia in  2003 during her last pregnancy.  She denies having any other complaints at  this point, no bleeding tendency, no epistaxis, bruises, or ecchymosis, no  evidence of petechiae.   REVIEW OF SYSTEMS:  No fever, chills, headache, blurring of vision, double  vision.  No chest pain, shortness of breath,  cough or palpitation.  She  still has mild nausea, no vomiting, abdominal pain, diarrhea, constipation,  melena, or hematochezia.  No dysuria, hematuria, urgency, or increased  frequency.   PAST MEDICAL HISTORY:  1.  Insulin-dependent diabetes mellitus.  2.  History of GERD and ulcer.  3.  History of depression.   FAMILY HISTORY:  Significant for coronary artery disease, hypertension, and  diabetes.   SOCIAL HISTORY:  She is married.  Has a history of smoking.  No alcohol or  drug abuse.   ALLERGIES:  No known drug allergies.   HOME MEDICATIONS:  Protonix, Zofran, insulin, Ambien, and Wellbutrin.   PHYSICAL EXAMINATION:  VITAL SIGNS:  Today her blood pressure was 122/61,  pulse 86, respiratory rate 18, temperature 98.8.  GENERAL:  She was a very pleasant 40 year old white female who appeared in  no acute distress.  HEENT:  Normocephalic, atraumatic.  Clear oropharynx.  NECK:  Supple.  No evidence of edema, thyromegaly, or lymphadenopathy.  CHEST:  Clear to auscultation,  no wheeze, crackle, or dullness to  percussion.  CARDIOVASCULAR:  Normal S1, S2.  Regular rhythm and rate.  No murmurs,  gallops, or rubs.  ABDOMEN:  Soft, nontender, no masses.  EXTREMITIES:  No edema, 2+ pulses, no cyanosis or clubbing.  No evidence of  petechiae, ecchymosis, or bruises.  NEUROLOGIC:  No focal sensory or motor deficit.   LABORATORY DATA:  CBC today showed white blood count 8.0, hemoglobin 11.5,  hematocrit 33.6, platelets 94.  Sodium 138, potassium 4.0, BUN 7, creatinine  0.6, blood glucose 63.   ASSESSMENT AND PLAN:  This is a 40 year old pregnant white female who  presented this admission with thrombocytopenia of unknown etiology.  The  patient had a similar episode in December 2003 during her previous  pregnancy.  This thrombocytopenia could be multifactorial, and some of the  culprit agents could be her antacid mediation, especially Zantac and to a  lesser extent Protonix, but at this  point I cannot exclude other causes, and  my recommendation will include to check daily CBC for platelet count, check  DIC panel, discontinue Zantac at this point, and may continue with Protonix  even with the fact that it may also cause mild thrombocytopenia.  I do not  see any need for a platelet transfusion at this point unless there is  bleeding tendency or platelet count less than 10,000.  I will continue to  follow up with you and check her platelet count on a daily basis.  If the  patient is discharged before recovery of her platelet count, I will be happy  to follow the patient up at the regional cancer center in one to two weeks  after discharge.   Thank you so much for allowing me to participate in the care of Ms. Karabin.     Moha   MKM/MEDQ  D:  01/15/2004  T:  01/16/2004  Job:  [id number]   cc:   Naima A. Normand Sloop, M.D.  [address], [address]  [place]  [place] [address]  Fax: [phone]

## 2010-06-11 NOTE — Discharge Summary (Signed)
NAMETEAGYN, FISHEL               ACCOUNT NO.:  [id number]   MEDICAL RECORD NO.:  [id number]          PATIENT TYPE:  INP   LOCATION:  9160                          FACILITY:  [facility]   PHYSICIAN:  Naima A. Dillard, M.D. DATE OF BIRTH:  November 09, 1970   DATE OF ADMISSION:  12/22/2003  DATE OF DISCHARGE:                                 DISCHARGE SUMMARY   ADMITTING DIAGNOSES:  1.  Intrauterine pregnancy at [redacted] weeks gestation.  2.  [place]uevenelle-onset diabetes/class R insulin-dependent diabetic.  3.  Pneumonia.  4.  Nausea and vomiting with ketonuria.   DISCHARGE DIAGNOSES:  1.  Intrauterine pregnancy at [redacted] weeks gestation, stable.  2.  [place]venelle-onset diabetes/class R insulin-dependent diabetic, stable.  3.  Pneumonia, stable.   Molly [name] is a 40 year old gravida 4 para 0-1-2-1 who was readmitted on  December 22, 2003 at 29 weeks 2 days gestation.  She was readmitted for  nausea and vomiting, diabetes management, with pneumonia.  She was admitted  x1 week and was discharged home on Friday, November 26 before being  readmitted on Monday, November 28.  Her nausea and vomiting had improved  prior to discharge and recurred on Sunday, November 27 and at time of  admission on Monday, November 28, she was unable to keep anything done and  had become ketonuric.  She has been afebrile since admission.  Her blood  work on December 22, 2003.  Her metabolic panel showed an elevated glucose  of 118 and slightly elevated SGOT and SGPT to 42.  Her CBC on admission  showed a wbc count of 8.1; hemoglobin of 11.9; hematocrit 34.3; and  platelets 174,000.  Repeat CBC on November 29 showed a wbc count of 6.8;  hemoglobin of 10.5; hematocrit of 30.1; and platelets 160,000.  Her  metabolic panel on November 29 found her potassium slightly lower at 3.4,  glucose was 145, and her SGOT and SGPT had normalized to 34 and 35.  She  continued to have a small amount of ketones in her urine on November 29.  The patient  has remained afebrile.  Her lungs have remained clear.  On this  December 24, 2003 she has completed her antibiotic course for pneumonia.  Her vital signs again have remained stable, she is afebrile.  Her lungs are  clear.  Her fetal heart rate has remained reactive.  She is not contracting  at all.  Her nausea and vomiting has improved and she is keeping down food  and fluids without any difficulty.  Her blood sugars have become somewhat  more normalized, with her fasting blood sugar of 70 this morning and blood  sugars of 70 to 204.  Her UA on November 30 is negative.  She has an  appointment to see Dr. Everardo All later this week to begin an insulin pump for  better glucose control with her pregnancy.  She will be scheduled for NSTs  biweekly at the office of [facility] to begin this Friday - December 26, 2003.  The  patient is aware of these appointments and states her agreement to follow  through with  the above plan.  She is to be discharged home today on December 24, 2003 in stable condition with appointments as discussed above.  She will  call for any signs or symptoms of nausea and vomiting, any shortness of  breath or preterm labor.     [name]   SDM/MEDQ  D:  12/24/2003  T:  12/24/2003  Job:  [id number]

## 2010-06-11 NOTE — Procedures (Signed)
[name], [name] NO.:  [id number]   MEDICAL RECORD NO.:  [id number]          PATIENT TYPE:  OUT   LOCATION:  SLEEP CENTER                 FACILITY:  [facility]   PHYSICIAN:  Marcelyn Bruins, M.D. [facility] DATE OF BIRTH:  1970-05-04   DATE OF STUDY:  09/17/2004                              NOCTURNAL POLYSOMNOGRAM   REFERRING PHYSICIAN:  Sean A. Everardo All, M.D. [facility].   DATE OF STUDY:  September 17, 2004.   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 Epworth sleepiness score: 7.   SLEEP ARCHITECTURE:  The patient had a total sleep time of 379 minutes with  very little slow wave sleep and increased REM. Sleep onset latency was very  prolonged at 106 minutes and REM onset was fairly short. Sleep efficiency  was 78%.   IMPRESSION:  1.  Small numbers of obstructive events which do not meet the respiratory      disturbance index criteria for the obstructive sleep apnea syndrome. The      patient was noted to have moderate snoring, however. There was transient      O2 desaturation to 86%.  2.  No clinically significant cardiac arrhythmias.  3.  Very large numbers of leg jerks with significant sleep disruption.      Clinical correlation is suggested. This may be the primary issue with      the patient's sleep disruption.           ______________________________  Marcelyn Bruins, M.D. [facility]  Diplomate, American Board of Sleep  Medicine     KC/MEDQ  D:  09/22/2004 16:10:41  T:  09/22/2004 14:78:29  Job:  [id number]

## 2010-06-11 NOTE — Op Note (Signed)
NAME:  Molly [name], Molly [name]                     ACCOUNT NO.:  [id number]   MEDICAL RECORD NO.:  [id number]                   PATIENT TYPE:  INP   LOCATION:  9181                                 [facility]ACILITY:  [facility]   PHYSICIAN:  Crist Fat. Rivard, M.D.              DATE OF BIRTH:  [birth year]   DATE OF PROCEDURE:  01/01/2002  DATE OF DISCHARGE:                                 OPERATIVE REPORT   PREOPERATIVE DIAGNOSES:  1. Intrauterine pregnancy at 35 weeks and 5 days.  2. Severe preeclampsia.  3. Poorly controlled insulin-dependent diabetes.  4. Large for gestational age.  5. Previous history of herpes simplex virus II.  6. The patient had requested a primary low transverse cesarean section.   POSTOPERATIVE DIAGNOSES:  1. Intrauterine pregnancy at 35 weeks and 5 days.  2. Severe preeclampsia.  3. Poorly controlled insulin-dependent diabetes.  4. Large for gestational age.  5. Previous history of herpes simplex virus II.  6. The patient had requested a primary low transverse cesarean section.   ANESTHESIA:  General, Quillian Quince, M.D.   PROCEDURE:  Primary low transverse cesarean section.   SURGEON:  Crist Fat. Rivard, M.D.   ASSISTANT:  Osborn Coho, M.D.   ESTIMATED BLOOD LOSS:  900 cc.   HISTORY OF PRESENT ILLNESS:  This is a 40 year old single white female  gravida 1, para 0 at 35 weeks and 5 days who has been followed closely for  poorly controlled insulin-dependent diabetes which is classified class R who  presented today for a follow-up of [facility] laboratories performed on December 27, 2001.  She is followed twice weekly, once with NSTs, once with biophysical  profile and her testing has remained normal.  Since December 27, 2001 she is  complaining of significant lower extremity edema with no other symptoms of  preeclampsia.  She presents today with normal blood pressure, still no  complaints but her laboratory work reveals a decrease in her platelets from  73,000 to  68,000, an increase in her liver function with SGOT/SGPT in the  lower 50s, and a uric acid which has increased above 5.  Her NST was  reactive yesterday, today and her last biophysical profile was 8/8 on  December 27, 2001.  She has been also known for large for gestational age  with growth above the 97th percentile since her 28th week of pregnancy.  Her  last hemoglobin A1C one month ago was 6.1 and she is followed closely by Dr.  Everardo All who will continue her postoperative care.  A voided urine specimen  revealed 100 mg of protein, all findings compatible with severe  preeclampsia.  The patient has consented to proceed with her primary low  transverse cesarean section after reviewing possible risks and complications  of bleeding, infection, injury to bowels, bladder, and ureters as well as  need for transfusion.   PROCEDURE:  The patient is placed in the dorsal decubitus position,  pelvic  tilted to the left.  She is prepped and draped in a sterile fashion and the  Foley catheter is inserted in her bladder.  General anesthesia is then  performed with endotracheal intubation and we proceed with a Pfannenstiel  incision down to the fascia.  Fascia is incised in a low transverse fashion.  Linea alba is dissected and peritoneum is entered in a blunt fashion.  Visceroperitoneum is incised in the low transverse fashion allowing [place] to  develop the bladder flap and safely retract bladder to open myometrium in a  low transverse fashion first with knife, then with blunt dissection.  Amniotic fluid is clear and abundant.  We assist the birth of a female  infant with vacuum extraction at 4:38 p.m. in [facility] presentation.  Mouth and  nose are suctioned and then shoulders are delivered.  The cord is clamped  with two Kelly clamps, sectioned, and then the baby is given to the  pediatrician present in the room.  20 cc of blood is drawn from the  umbilical vein and the placenta is allowed to deliver  spontaneously.  It is  complete and the cord has three vessels.  Uterine revision is negative.  We  proceed with closure of the myometrium in two layers, first with a running  locked suture of 0 Vicryl, then with a Lembert suture of 0 Vicryl covering  the first one.  Hemostasis is assessed and completed on the peritoneal edges  with cautery.  Both tubes and ovaries are assessed and adequate.  Both  pericolic gutters are cleansed and the pelvis is irrigated with warm saline.  Hemostasis is reassessed and adequate.  Under fascia hemostasis is completed  with cautery and the fascia is closed with two running suture of 0 Vicryl  meeting midline.  Fat and subcutaneous tissues are irrigated with warm  saline.  Hemostasis is completed with cautery and the skin is closed with  staples.   Instruments and sponge counts are complete x2.  Estimated blood loss is 900  cc.  The little girl named Lelon Mast received an Apgar of 8 at one minute, 9  at five minutes and weighed 7 pounds 15 ounces.  The procedure was very well  tolerated by the patient.  She was taken to the recovery room in a well and  stable condition.  The patient will be started on magnesium sulfate for the  next 24 hours.                                               Crist Fat Rivard, M.D.    SAR/MEDQ  D:  01/01/2002  T:  01/01/2002  Job:  [id number]

## 2010-06-11 NOTE — H&P (Signed)
NAME:  Molly [name], Molly [name]                     ACCOUNT NO.:  [id number]   MEDICAL RECORD NO.:  [id number]                   PATIENT TYPE:  INP   LOCATION:  9181                                 [facility]ACILITY:  [facility]   PHYSICIAN:  Osborn Coho, M.D.                DATE OF BIRTH:  1970/10/11   DATE OF ADMISSION:  01/01/2002  DATE OF DISCHARGE:                                HISTORY & PHYSICAL   HISTORY OF PRESENT ILLNESS:  The patient is a 41 year old gravida 3, para 0,  0, 2, 0 at 35-5/7 weeks by last menstrual period and 36-1/7 weeks by 12-week  ultrasound who presents for admission and cesarean section secondary to  decreased platelet count, proteinuria, insulin-dependent diabetes, and  history of HSV.  Platelet count was 73,000 on December 04; it is 68,000  today.  The patient has a primary C-section planned December 18 for history  of HSV with no outbreaks; had an amnio scheduled December 17 for maturity.  Blood pressures were within normal limits at the office.  Platelet count at  [facility] was 190,000, recheck in October was 107,000 and on December 04 it was  73,000.   Pregnancy has been remarkable for:  1. Class R diabetes, juvenile onset, on an insulin pump.  The patient is     managed by Dr. Romero Belling.  2. Depression on Wellbutrin.  3. Smoker.  4. History of HSV, but no current or recent outbreaks.  5. History of migraines.   PRENATAL LABORATORY DATA:  Blood type is A positive, Rh antibody negative.  VDRL nonreactive.  Rubella titer positive. Hepatitis B surface antigen  negative.  Pap is normal.  GC and Chlamydia cultures were negative in June.  AFP was normal in July.  Hemoglobin upon entering the practice was 13.5 and  it was within normal limits at 11.5 at 28 weeks.  Group B Strep culture was  negative.  EDC of January 31, 2002 was established by last menstrual period.  EDC of January 27, 2002 was established by ultrasound at 12 weeks and was in  agreement with ultrasound at  approximately 18 weeks.   HISTORY OF PRESENT PREGNANCY:  The patient transferred from [facility]  at 25 weeks.  She had been managed by them prior to that time.  She is a  longstanding insulin-dependent diabetic, juvenile onset since age 12.  She  has been on an insulin pump for the last several years.  She had borderline  compliance and essentially poor control of her diabetes throughout much of  her pregnancy.  She has a history of also having depression on Wellbutrin  and a remote history of HSV.  The patient has had some migraines in her  pregnancy.  She was managed by Dr. Romero Belling.  She had another ultrasound  at 16 weeks.  At her first visit her platelet count was 190,000.  She had a  platelet count of  116,000 at approximately 16 weeks. She had no other CBC's  at [facility].  When she transferred to [facility] her next platelet  count check was approximately 28 weeks, it was 107,000.  She had a 24-hour  urine at 27 weeks.  She is using Imitrex p.r.n. for headaches.  Her  hemoglobin A-1-C remained elevated, approximately 6.3.  She had a fetal  echocardiogram at approximately  34 weeks.  She continued to show blood  sugar levels outside normal limits.  She began to have weekly BPD's and  AFI's, and weekly NST's.  She began to have gross evaluations every two  weeks; that was at approximately 29 weeks.  At approximately she discussed  with Dr. Estanislado Pandy delivery management.  The patient desired a primary C-  section even after explanation of low-risk if not active or prodromal.  She  had a normal echo at 30 weeks.  Hemoglobin A-1-C was 6.3.  she had a  sonogram at 32 weeks that showed greater than the 95th percentile.  Decision  was made to proceed with amnio at 36 weeks for lung maturity and primary C-  section to follow.  She had an ultrasound at 67, almost 34 weeks, that  showed 90 to the 95th percentile growth normal AFI and BPD of 8/8.  Blood  sugars were still frequently  outside normal limits.  She had a blood  pressure on December 04 of 146/80.  PIH labs were done with the note of  platelet count of 73,000 upon evaluation of results.   PAST OBSTETRICAL HISTORY:  In 1993 she had spontaneous miscarriage without  complications.  In 1994 she had a spontaneous miscarriage without  complications.  Both of these were in the first trimester.   PAST MEDICAL HISTORY:  She has a history of HSV, only one outbreak,  diagnosed approximately four years ago.  She reports the usual childhood  illnesses and immunizations.  She was diagnosed with diabetes at age 27.  She has been on insulin ever since and is now on a Humalog pump.  The  patient has occasional UTIs.  She does have a history of migraines and  depression.  She had some type of cyst removed in the past.  Had a carpal  tunnel syndrome repair and a broken wrist repaired.   ALLERGIES:  She has no know medication allergies.   FAMILY HISTORY:  Her maternal grandfather and maternal grandmother had heart  attacks.  Maternal grandparents, paternal grandparents and father have  hypertension.  The patient's maternal first cousin had a history of blood  clots during pregnancy.  All grandparents have adult onset diabetes.  The  patient's father is on insulin and her grandparents are on oral medications.  The patient's father has a history of kidney stones.   GENETIC HISTORY:  Unremarkable.   SOCIAL HISTORY:  The patient is separated from the father, his name is  Modena Nunnery.  The patient is high school educated.  She is employed as  a Engineer, production at [employer].  She is Caucasian.  Her partner has two years  of college.  He is employed in the Therapist, occupational.  The patient denies any  alcohol or drug use during pregnancy.  She has been a smoker.  The patient  has been followed by the Phys[facility]'s Service, [facility].  PHYSICAL EXAMINATION:  VITAL SIGNS:  Blood pressure is 148/64 and 130/70.  Other vital  signs are stable.  HEENT:  Within normal limits.  LUNGS:  Bilateral breath sounds are clear.  HEART:  Regular rate and rhythm without murmur.  BREASTS:  Soft and nontender.  ABDOMEN:  Fundal height is approximately 37 cm.  Abdomen is soft and  nontender.  Fetal heart rate is reactive with no decelerations.  EXTREMITIES:  Deep tendon reflexes are 2+ without clonus, but with trace  edema noted.   LABORATORY DATA:  Hemoglobin is 12.1, WBC count is 7.1 and platelets are 68.  Clean catch urine shows specific gravity of 1.020, 100 for protein on a  voided specimen, positive __________ test and positive bilirubin.  Comprehensive metabolic panel showed SGOT of 58 and SGPT of 50.  Glucose was  123, LDH was 177 and uric acid was 55.8.   IMPRESSION:  1. Intrauterine pregnancy at 35-36 weeks.  2. Thrombocytopenia.  3. Proteinuria on a voided specimen.  4. Insulin-dependent diabetes on a pump.   PLAN:  1. Admit for cesarean section today at 4 P.M.  2. Implement Glucomander and then discontinue the insulin pump.  This will     be done under the management of Dr. Romero Belling at [facility]; [facility] hospitalist is Dr. Thayer Headings; will see the patient on     January 02, 2002.  3.     Patient is to be NPO.  4. Review the plan of care with the patient; she is agreeable to proceed     with C-section.  5. MDs will follow.       Renaldo Reel Emilee Hero, C.N.M.                   Osborn Coho, M.D.    VLL/MEDQ  D:  01/01/2002  T:  01/01/2002  Job:  [id number]

## 2010-06-11 NOTE — Discharge Summary (Signed)
Molly [name], Molly [name]               ACCOUNT NO.:  [id number]   MEDICAL RECORD NO.:  [id number]          PATIENT TYPE:  INP   LOCATION:  9157                          [facility]ACILITY:  [facility]   PHYSICIAN:  Stacie Glaze, M.D. LHCDATE OF BIRTH:  February 27, 1970   DATE OF ADMISSION:  12/16/2003  DATE OF DISCHARGE:  12/19/2003                                 DISCHARGE SUMMARY   ADMISSION DIAGNOSIS:  Intractable nausea and vomiting.   DISCHARGE DIAGNOSES:  Right lower lobe pneumonia, nausea and vomiting  secondary to pneumonia and juvenile diabetes on insulin pump.   HOSPITAL COURSE:  The patient is a pleasant 40 year old white female who is  at [redacted] weeks gestation with type 1 diabetes on Humalog via continuous  subcutaneous insulin pump who presented to Dr. Romero Belling, her primary  care physician with an upper respiratory tract infection.  Subsequent  evaluation revealed to be a lobar pneumonia with an elevated white count.  She was admitted to [facility], placed on Rocephin and Azithromycin,  was initially febrile but defervesced over 48 hours.  Respiratory distress  resolved although her insulin needs did fluctuate due to the infection.  She  was in reasonable control of her diabetes and her nausea and vomiting  abated.  She was converted to oral antibiotics on the third day of her  admission, remained afebrile for 24 hours prior to admission and is now  discharged to home in stable condition.   DISCHARGE MEDICATIONS:  Her discharge medications include:  1.  Nasonex two sprays each nostril daily.  2.  Ceftin 250 mg p.o. b.i.d. for 10 additional days.  3.  Azithromycin 250 mg p.o. daily for four additional days.   She should plan follow up with Dr. Romero Belling for evaluation of complete  resolution of her upper respiratory track infection, continuing medications,  monitoring of her insulin pump within one week of discharge.      JEJ/MEDQ  D:  12/19/2003  T:  12/19/2003  Job:  [id number]

## 2010-06-11 NOTE — Discharge Summary (Signed)
NAMEMEMPHIS, CRESWELL               ACCOUNT NO.:  [id number]   MEDICAL RECORD NO.:  [id number]          PATIENT TYPE:  INP   LOCATION:  9156                          [facility]ILITY[facility]   PHYSICIAN:  Hal Morales, M.D.DATE OF BIRTH:  [birth year]   DATE OF ADMISSION:  01/14/2004  DATE OF DISCHARGE:  01/16/2004                                 DISCHARGE SUMMARY   ADMISSION DIAGNOSES:  1.  Intrauterine pregnancy at 54 and three-sevenths weeks.  2.  Hyperemesis with ketonuria.  3.  Insulin-dependent diabetes.   DISCHARGE DIAGNOSES:  1.  Intrauterine pregnancy at approximately [redacted] weeks gestation.  2.  Insulin-dependent diabetes.  3.  Thrombocytopenia.  4.  No evidence of preeclampsia.  5.  Resolved nausea and vomiting.   PROCEDURES THIS ADMISSION:  None.   HOSPITAL COURSE:  [name] is a 40 year old married white female gravida 4  para 0-1-2-1 at 33-and-a-half weeks gestation admitted for IV hydration  secondary to recent increased nausea and vomiting with ketonuria.  She is an  insulin-dependent diabetic prior to pregnancy and has been managed on  insulin pump throughout this pregnancy, with this being the third episode of  admission for IV fluid hydration.  She was also noted to have  thrombocytopenia upon admission and underwent a 24-hour urine to rule out  preeclampsia issues, and her 24-hour urine was negative for protein.  Also,  on hospital day #2 - today - she is feeling better, tolerating a regular  diet without any further nausea since midday on December 22.  She reports  positive fetal movement.  She denies any uterine contractions.  She has had  hematology consult secondary to her thrombocytopenia and they are currently  unsure of its etiology, but feel that she is okay for discharge.  She is  therefore deemed ready for discharge today.   DISCHARGE INSTRUCTIONS:  She is to call for reoccurring nausea and vomiting,  signs or symptoms of preterm labor, or signs or  symptoms of preeclampsia -  all of which were reviewed with the patient.   DISCHARGE MEDICATIONS:  1.  Continue on the insulin pump.  2.  Take Protonix 40 mg p.o. daily.  3.  Take Ambien as needed for sleep.  4.  Zofran 8 mg p.o. q.8h. p.r.n. for nausea and vomiting.   DISCHARGE LABORATORY DATA:  Her hemoglobin is 11.9; her hematocrit is 34.5;  her platelets are 90,000.  Her wbc count is 7.6.  Her PT is 12.7, her INR is  0.9, her PTT is 29, fibrinogen is 410, and her D-dimer is 3.92.  Her SGOT is  24, her SGPT is 20.  Her 24-hour urine protein was 173.  Her clean-catch  urine on day of discharge was within normal limits except for glucose.   DISCHARGE FOLLOW-UP:  In 1 week at [facility] and via the  telephone or as soon as possible with her endocrinologist to manage her  blood sugars as needed, and with hematology as they decide.     Shel   SJD/MEDQ  D:  01/16/2004  T:  01/17/2004  Job:  [id number] 

## 2010-06-11 NOTE — H&P (Signed)
Molly [name], Molly [name]               ACCOUNT NO.:  [id number]   MEDICAL RECORD NO.:  [id number]          PATIENT TYPE:  OBV   LOCATION:  9149                          FACILITY:  [facility]   PHYSICIAN:  Janine Limbo, M.D.DATE OF BIRTH:  [birth year]   DATE OF ADMISSION:  02/11/2004  DATE OF DISCHARGE:                                HISTORY & PHYSICAL   HISTORY OF PRESENT ILLNESS:  Ms. Molly [name] is a 40 year old married white  female, gravida 4, para 0-1-2-1 at 37-4/7 weeks, who presents with nausea  and vomiting since the morning of February 10, 2004.  She denies fever and  diarrhea.  She reports she has had similar episodes x5 during this  pregnancy, always requiring IV fluids before she is feeling better.  Her  pregnancy has been followed by the [facility] M.D. service and  has been remarkable for: #1 - HSV-2, #2 - insulin-dependent diabetic with a  pump, #3 - diabetic retinopathy, #4 - smoker, #5 - migraines, #6 -  thrombocytopenia with last pregnancy, #7 - history of preeclampsia, #8 -  history of C-section and plans for repeat.  Her prenatal labs were collected  on September 02, 2003:  Hemoglobin 11.8, hematocrit 36.6, platelets 158,000.  Blood type A-positive, antibody negative.  RPR nonreactive.  Rubella immune.  Hepatitis B surface antigen negative.  Culture of the vaginal tract for  group B strep, gonorrhea and Chlamydia on January 29, 2004 were [name] negative.   HISTORY OF PRESENT PREGNANCY:  The patient presented for care at [facility] on August 18, 2003 at 12 weeks' gestation.  Pregnancy ultrasonogram  shows best [facility] February 29, 2004.  Her diabetes is being followed by Dr. Gregary Signs  A. Everardo [name]; she is class R insulin-dependent diabetic and she uses an  insulin pump.  The patient has expressed a desire for a repeat cesarean  section for this delivery.  Her CBGs have remained stable throughout the  pregnancy.  Ultrasonography at 21 weeks' gestation shows normal anatomy.  Ultrasonography at 25 weeks' gestation shows estimated fetal weight in the  50th to 75th percentile.  Hemoglobin A1c on November 20, 2003 was 6.0 and her  basal rate was increased slightly.  At 30 weeks' gestation, the patient was  treated with Ceftin which caused her to start vomiting; she was sent to  [facility] for IV fluids and was admitted for observation.  Again,  January 14, 2004, at 70 weeks' gestation, she was admitted due to  persistent vomiting and was treated with IV fluids and Zofran.  The patient  underwent regular antenatal testing during the pregnancy.  Pregnancy  ultrasonography at 33 weeks' gestation showed estimated fetal weight 5  pounds and 10 ounces.  Thirty-five weeks' estimated fetal weight was the  90th to 95th percentile with normal fluids, BPP 8/8 and her sugars remained  normal.   OBSTETRICAL HISTORY:  She is a gravida 4, para 0-1-2-1.  In 1993 and in '94,  she had SABs with no complications.  In December of 2003, she had a C-  section for a female infant, weight 7  pounds and 15 ounces, at 35-5/7 weeks'  gestation.  Infant's name was Molly [name].  She had severe preeclampsia,  thrombocytopenia and diabetes with that pregnancy and the infant was breech.   MEDICAL HISTORY:  She experienced menarche at the age of 46 and has had 58-  day cycles ever since lasting 4-6 days.  She has used condoms in the past  for contraception.  She has been diagnosed with HSV-2.  She has occasional  yeast infections.  She reports having had the usual childhood illnesses.  She had juvenile-onset diabetes.  She had ulcers as a teen.  She has a  history of migraines and depression for which she has taken Wellbutrin in  the past.   ALLERGIES:  She has no medication allergies.   PAST SURGICAL HISTORY:  Surgical history is remarkable for wisdom teeth  extraction and an abscess in her groin, and her C-section.   FAMILY MEDICAL HISTORY:  Family medical history is remarkable for  maternal  grandfather and maternal grandmother with heart disease, multiple family  members with hypertension, patient's cousin with phlebitis in pregnancy,  multiple family members with diabetes.   GENETIC HISTORY:  Negative.   SOCIAL HISTORY:  The patient is married to the father of the baby; his name  is Molly [name]; he is involved and supportive.  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  Father of the baby has 2 years of college  and is employed at [employer].  The patient has smoked during the pregnancy, but  denies any alcohol or illicit drug use.   OBJECTIVE DATA:  VITAL SIGNS:  Vital signs are stable; temperature is 99.3.  HEENT:  HEENT is grossly within normal limits.  CHEST:  Chest is clear to auscultation.  HEART:  Regular rate and rhythm.  ABDOMEN:  Abdomen is gravid in contour with the fundal height extending  approximately 37 cm above the pubic symphysis.  Fetal heart rate is reactive  and reassuring.  She has occasional mild contractions.  EXTREMITIES:  Extremities are normal.   LABORATORY AND ACCESSORY CLINICAL DATA:  CVP is 78 mg/dl.   Clean-catch urinalysis shows greater than 80 of ketones, 30 of protein,  moderate bilirubin, a few bacteria, mucus and many squamous epithelial  cells.  CBC:  Hemoglobin 13.8, hematocrit 41.4, white blood cell count 11.5,  platelets 105,000, neutrophils 88.  SGOT is 26, SGPT is 15.   ASSESSMENT:  1.  Intrauterine pregnancy at term.  2.  Nausea and vomiting with ketonuria.  3.  Thrombocytopenia.   PLAN:  Plan is to admit for 23-hour observation and treat with IV fluids and  Zofran.  M.D. service to follow.      KS/MEDQ  D:  02/12/2004  T:  02/12/2004  Job:  [id number]

## 2010-06-11 NOTE — Op Note (Signed)
[place]  Patient:    Molly [name], Molly [name]                 MRN: [id number] Proc. Date: 05/12/99 Adm. Date:  05/12/99 Attending:  Thornton Park. Daphine Deutscher, M.D. CC:         Justine Null, M.D. [facility]             Matthew B. Daphine Deutscher, M.D.                           Operative Report  PREOPERATIVE DIAGNOSIS:  Insulin-dependent diabetes mellitus with perirectal abscess.  POSTOPERATIVE DIAGNOSIS:  insulin-dependent diabetes mellitus with perianal abscess.  No evidence of fistula in in ano.  PROCEDURE:  Exam under anesthesia with incision, culture and drainage of perirectal abscess.  SURGEON:  Thornton Park. Daphine Deutscher, M.D.  ANESTHESIA:  General by mask.  DESCRIPTION OF PROCEDURE:  Molly [name] is a 40 year old lady who was seen in the office by Carman Ching this morning and had a perirectal.  She was reluctant to have this drained under straight local in the office and arrangements were made for me to meet her here in [facility] for drainage under anesthesia.  I saw her in the holding area and explained the nature of perirectal abscesses, the nature of fistula in ano, and the approach and the potential for recurrence.  She was then taken to OR 7 and placed in the dorsolithotomy position with general anesthesia administered by mask.  Her anus and buttocks were prepped with Betadine and draped sterilely.  On my exam, under anesthesia, including a digital exam and then an anoscopic exam, I did not see any obvious entrance points for a fistula.  According to [place] rule this abscess was at about the 10 oclock position and thus would probably drain radially into the rectum and no evidence of communication track was seen.  I went ahead and felt the fluctuant area which is about an inch or more away from the anus junction and I put an 18 gauge needle in and drew out 1 cc of pus which I sent for aerobic and anaerobic cultures.  I then made a  radial incision over that and got into an abscess cavity that was about 2 cm in greatest dimension.  I then used a medium and then a small tear duck probe to find a track.  No track going radially or going centrally toward the anus was perceived.  There was no track going deep posterior.  The abscess cavity had pus that did not smell anaerobic and did not smell like bacteroides.  I went ahead and was satisfied that there was no communication, then opened the entire cavity wide open.  I then curettaged the base and then used electrocautery to control any bleeding.  The area was then packed with Betadine gauze, and sterile dressings applied.  The patient was taken to the recovery room.  She will be given Augmentin 875 mg to take 1 twice a day for 10 days and Tylox 20 to take as needed for pain.  An instruction sheet was filled out and the patient was asked to return to see me in the office in approximate 1 week. DD:  05/12/99 TD:  05/12/99 Job: [id number]

## 2010-06-11 NOTE — Consult Note (Signed)
Molly [name], Molly [name]               ACCOUNT NO.:  [id number]   MEDICAL RECORD NO.:  [id number]          PATIENT TYPE:  INP   LOCATION:  9149                          [facility]ACILITY:  [facility]   PHYSICIAN:  Clinton D. Maple Hudson, M.D. DATE OF BIRTH:  1970/02/01   DATE OF CONSULTATION:  02/16/2004  DATE OF DISCHARGE:                                   CONSULTATION   REFERRING PHYSICIAN:  Dr. Osborn Coho.   PROBLEM FOR CONSULTATION:  Pregnant patient with thrombocytopenia, volume  overload and atelectasis with productive cough.   HISTORY:  [age]-year-old smoker with insulin-dependent diabetes, now  at 88 weeks' pregnancy with tine gestational thrombocytopenia.  Platelet  count may have stabilized now at 64,000.  She has been fluid-overloaded,  estimated at 2 L, with oliguria, but in the last 24 hours, has begun to  diurese.  Family has had a viral syndrome/respiratory illness.  She had come  in with apparently some mild nasal congestion, but overnight has developed a  cough productive of yellow-green sputum without fever, pharyngitis, otitis  or chest pain.  She had been on Kefzol for a UTI and Zithromax has been  added.  White blood count is now at 8700.  Chest x-ray has been reviewed by  me and shows interval development of left lower lobe atelectasis.  There may  be very slight increased prominence of bronchovascular markings in the right  lower lung zone, but there is not frank pulmonary edema or definite  infiltrate or effusion.   PAST HISTORY:  1.  Previous pregnancy with preeclampsia.  2.  Insulin-dependent type 1 diabetes.  3.  HSV-2.  4.  Remote history of peptic ulcer disease.   PAST SURGICAL HISTORY:  Surgeries are:  1.  Wisdom teeth extraction.  2.  Groin abscess.  3.  C-section.   FAMILY HISTORY:  Positive for heart disease, hypertension, a cousin with  phlebitis in pregnancy and multiple family members with diabetes.   SOCIAL:  Smoker.  No alcohol or street drug use.   OBJECTIVE:  VITAL SIGNS:  Temperature 98.3, BP 123/64, pulse 65, room air  oxygen saturation last night was 93, now improved on 2 L prongs.  GENERAL:  The patient is an alert, articulate, apparently comfortable woman  sitting upright in bed.  HEENT:  Conjunctivae and oral mucosa are clear.  NECK:  No palpable cervical nodes, although she thinks glands are a little  big.  No hoarseness.  No JVD.  CHEST:  Shallow bilateral breath sounds.  She has been coughing, but did not  cough while I was with her and I did not hear rhonchi or wheeze.  HEART:  Heart sounds were rapid and regular without murmur heard.  EXTREMITIES:  No cyanosis or clubbing.   IMPRESSION:  Issue of timing relates to question of whether platelet count  has stopped falling.  She is beginning to diurese and hopefully this will  resolve the recent 2-L fluid retention.  I do not see evidence of pulmonary  edema.  The respiratory syndrome is unlikely to be a gram-negative pneumonia  as part of a urosepsis  syndrome.  More likely, we are seeing a viral-pattern  respiratory illness with some bronchitis which will take a few days to  clear.  Zithromax is reasonable coverage at this point with sputum culture  pending.  I would favor giving her time to clear her chest and to clear her  positive fluid balance, since these are likely to take care of themselves.  If the respiratory illness gets worse, we will have to adjust based on  cultures and x-ray pattern.  I would repeat her chest x-ray tomorrow.  Hopefully, the platelet count will stabilize, but should be followed  closely.  Understand preference for spinal anesthesia for cesarean section.   RECOMMENDATION:  1.  Pulmonary toilet to include encouraging cough, ambulation to drop her      diaphragm.  2.  Agree with Zithromax, pending culture results.  3.  Would repeat chest x-ray tomorrow and as needed to establish trend with      the left lower lobe process.  4.  Please call us  as needed.      CDY/MEDQ  D:  02/16/2004  T:  02/16/2004  Job:  [id number]   cc:   Osborn Coho, M.D.  Fax: [phone]

## 2010-06-11 NOTE — H&P (Signed)
NAMEEVOLA, HOLLIS               ACCOUNT NO.:  [id number]   MEDICAL RECORD NO.:  [id number]          PATIENT TYPE:  INP   LOCATION:  9160                          [facility]ILITY:  [facility]   PHYSICIAN:  Naima A. Dillard, M.D. DATE OF BIRTH:  [birth year]   DATE OF ADMISSION:  12/22/2003  DATE OF DISCHARGE:                                HISTORY & PHYSICAL   HISTORY OF PRESENT ILLNESS:  [name] is a 40 year old married white  female, gravida 4, para 0-1-2-1, at 29-2/7th weeks, who is readmitted today  for persistent nausea, vomiting, and diabetes management.  She was admitted  approximately a week ago for diabetes management and pneumonia therapy and  is continuing on p.o. antibiotics for that at this time.  She reports that  she had improved nausea and vomiting somewhat earlier in the week and  towards the end of last week, but that it recurred last night and when she  came into the office today she was continued with nausea, vomiting, and also  noted to have ketonuria.  She denies any shortness of breath or chest pain.  She reports positive fetal movement.  She denies any leaking of fluid,  vaginal bleeding, or uterine contractions.  She also denies any fever.   Her pregnancy has been followed at [facility] and it is  complicated by insulin-dependent diabetes prior to pregnancy, HSV II,  diabetic retinopathy, smoker, history of migraines, history of  thrombocytopenia with her last pregnancy and breech, history of preeclampsia  at 35 weeks, breech presentation, and previous prior low transverse cesarean  section with her last pregnancy for breech.   OBSTETRICAL HISTORY:  Her LMP for this pregnancy is May 31, 2003 with a [facility]  of March 06, 2004 and confirmed by ultrasound to be February 29, 2004. She  had a miscarriage in 1993 with no complications.  Another one in 1994  without complications and she delivered a viable female infant in December  2003 who weighed 7 pounds, 15  ounces at 35-5/7th weeks by a primary low  transverse cesarean section for breech.  She also had severe preeclampsia,  thrombocytopenia, and insulin-dependent diabetes prior to that pregnancy as  well.   Other GYN history of note is that she has a history of HSV II with her last  outbreak occurring in July of 2005.   ALLERGIES:  She has no known drug allergies.   PAST MEDICAL HISTORY:  She reports having the usual childhood diseases.  She  had insulin-dependent diabetes prior to this pregnancy and it was juvenile  onset for her.  She has an endocrinologist.  History of ulcers as a  teenagers.  Gastroesophageal reflux currently with this pregnancy on  Protonix.  History of depression and has been on Wellbutrin in the past but  not currently.   PAST SURGICAL HISTORY:  Wisdom teeth abscess and cyst in her groin and  axilla.   FAMILY HISTORY:  Significant for maternal grandfather and maternal  grandmother with heart disease.  Multiple grandparents, aunts, and uncles  with chronic hypertension.  A cousin with phlebitis in pregnancy.  Multiple  family members also with adult onset diabetes mellitus.  Her genetic history  is negative.   SOCIAL HISTORY:  She is married to Francetta Found who is involved and  supportive.  They are both employed full-time.  They deny any illicit drug  use, alcohol, or smoking with this pregnancy.   PRENATAL LABORATORY DATA:  Blood type is A positive.  Her antibody screen is  negative.  Syphilis is nonreactive.  Rubella is positive.  Hepatitis B  surface-antigen is negative.  HIV was declined.  Cystic fibrosis was  declined.  She also declined the quad screen.   PHYSICAL EXAMINATION:  VITAL SIGNS:  Blood pressure is 122/64, temperature  98.8 pulse is 94, respirations are 20.  HEENT:  Grossly within normal limits.  HEART:  Regular rate and rhythm.  CHEST:  Clear.  BREASTS:  Soft and nontender.  ABDOMEN:  Gravid.  Fundal height around 29 cm and ST is  reactive and  reassuring.  PELVIC:  Deferred at this time.  EXTREMITIES:  Within normal limits.   LABORATORY DATA:  Her WBC is 8.1, hemoglobin is 11.9, hematocrit 34.3,  platelets are 174,000.  Sodium is 136, potassium is 4.3, glucose is 118,  creatinine is 0.6.  SGOT is 42, SGPT is 42, and calcium is 8.7.   ASSESSMENT:  1.  Intrauterine pregnancy at approximately [redacted] weeks gestation.  2.  Juvenile onset diabetes, insulin-dependent diabetes.  3.  Pneumonia.  4.  Nausea and vomiting with ketonuria.   PLAN:  To admit to [facility] for blood sugar control, management of  nausea and vomiting, and continued treatment of her pneumonia per Dr. Samule Ohm  A. Dillard.     Shel   SJD/MEDQ  D:  12/23/2003  T:  12/23/2003  Job:  [id number]

## 2010-06-15 ENCOUNTER — Ambulatory Visit (INDEPENDENT_AMBULATORY_CARE_PROVIDER_SITE_OTHER): Payer: BC Managed Care – PPO | Admitting: Endocrinology

## 2010-06-15 ENCOUNTER — Other Ambulatory Visit (INDEPENDENT_AMBULATORY_CARE_PROVIDER_SITE_OTHER): Payer: BC Managed Care – PPO

## 2010-06-15 ENCOUNTER — Encounter: Payer: Self-pay | Admitting: Endocrinology

## 2010-06-15 VITALS — BP 114/70 | HR 72 | Temp 99.0°F | Ht 67.0 in | Wt 150.6 lb

## 2010-06-15 DIAGNOSIS — E109 Type 1 diabetes mellitus without complications: Secondary | ICD-10-CM

## 2010-06-15 LAB — HEMOGLOBIN A1C: Hgb A1c MFr Bld: 6.3 % (ref 4.6–6.5)

## 2010-06-15 NOTE — Patient Instructions (Addendum)
blood tests are being ordered for you today.  please call [phone] to hear your test results.  You will be prompted to enter the 9-digit "MRN" number that appears at the top left of this page, followed by #.  Then you will hear the message. pending the test results, please continue the same medications for now. continue basal rate of 1 unit/hr, except for 0.9 units/hr, 3 am to 6 am decrease mealtime bolus to 1 unit/ 12 grams carbohydrate.  however, subtract 1 unit from calculated lunch bolus. continue correction bolus (which some people call "sensitivity," or "insulin sensitivity ratio," or just "isr") of 1 unit for each 50 by which your glucose exceeds 100. good diet and exercise habits significanly improve the control of your diabetes.  please let me know if you wish to be referred to a dietician.  high blood sugar is very risky to your health.  you should see an eye doctor every year. controlling your blood pressure and cholesterol drastically reduces the damage diabetes does to your body.  this also applies to quitting smoking.  please discuss these with your doctor.  you should take an aspirin every day, unless you have been advised by a doctor not to. (update: i left message on phone-tree:  reduce boluses to 1 unit/13 grams cho).

## 2010-06-15 NOTE — Progress Notes (Signed)
Subjective:    Patient ID: Molly [name], female    DOB: 1970/08/03, 40 y.o.   MRN: [id number]  HPI she brings an extensive record of her cbg's which i have reviewed today.  Her ins co. won't pay for a cgm.  She has hypoglycemia 1-2/week. She takes a total of approx 70 units of humalog per day, via her pump.   Past Medical History  Diagnosis Date  . SMOKER 08/11/2009  . CONTUSION, ANKLE 08/28/2007  . SEBACEOUS CYST, INFECTED 07/13/2007  . TINEA VERSICOLOR 07/13/2007  . ALLERGIC RHINITIS CAUSE UNSPECIFIED 03/06/2007  . MIGRAINE, CHRONIC 03/06/2007  . DIABETES MELLITUS, TYPE I 08/03/2006  . DEPRESSION 08/03/2006  . THROMBOCYTOPENIA 05/07/2008  . Encounter for long-term (current) use of other medications   . DM retinopathy     Past Surgical History  Procedure Date  . Abnormal u/s 12/10/1996  . Electrocardiogram 06/13/2006    History   Social History  . Marital Status: Married    Spouse Name: N/A    Number of Children: N/A  . Years of Education: N/A   Occupational History  . Not on file.   Social History Main Topics  . Smoking status: Current Everyday Smoker  . Smokeless tobacco: Not on file  . Alcohol Use: No  . Drug Use: No  . Sexually Active: Not on file   Other Topics Concern  . Not on file   Social History Narrative   Does not work outside the home    Current Outpatient Prescriptions on File Prior to Visit  Medication Sig Dispense Refill  . carbamazepine (TEGRETOL XR) 200 MG 12 hr tablet Take 2 by mouth every morning daily       . glucose blood (FREESTYLE LITE) test strip Use as instructed 8/day dx 250.01, and lancets  800 each  3  . Insulin Infusion Pump Supplies (MINIMED INFUSION SET-MMT 398) MISC Inject 1 Device into the skin 3 days. Change every 3 days  30 each  3  . Insulin Infusion Pump Supplies (PARADIGM RESERVOIR ) MISC Inject 1 Device into the skin 3 days. Change every 3 days  30 each  3  . insulin lispro (HUMALOG) 100 UNIT/ML injection For use in pump,  total 70 units/day       . lamoTRIgine (LAMICTAL) 200 MG tablet Take 200 mg by mouth daily.        . Lancets (FREESTYLE) lancets Use as instructed dx 250.01  800 each  3  . rosuvastatin (CRESTOR) 40 MG tablet 1 tablet at bedtime       . Transparent Dressings (OPSITE IV 3000) MISC Inject 1 Device into the skin 3 days. Change every 3 days  30 each  3  . Transparent Dressings (OPSITE IV 3000) MISC Inject 1 Device into the skin 3 days.  30 each  3  . ziprasidone (GEODON) 60 MG capsule Take 2 by mouth at bedtime         Allergies  Allergen Reactions  . Varenicline Tartrate     REACTION: rash    Family History  Problem Relation Age of Onset  . Diabetes Father     (Oral Agents)  . Cancer Neg Hx     BP 114/70  Pulse 72  Temp(Src) 99 F (37.2 C) (Oral)  Ht 5\' 7"  (1.702 m)  Wt 150 lb 9.6 oz (68.312 kg)  BMI 23.59 kg/m2  SpO2 98%    Review of Systems Denies loc.    Objective:   Physical Exam  GENERAL: no distress Pulses: dorsalis pedis intact bilat.   Feet: no deformity.  no ulcer on the feet.  feet are of normal color and temp.  no edema Neuro: sensation is intact to touch on the feet     Lab Results  Component Value Date   HGBA1C 6.3 06/15/2010      Assessment & Plan:  Dm is overcontrolled.

## 2010-08-31 ENCOUNTER — Other Ambulatory Visit: Payer: BC Managed Care – PPO

## 2010-08-31 ENCOUNTER — Ambulatory Visit: Payer: BC Managed Care – PPO

## 2010-08-31 ENCOUNTER — Other Ambulatory Visit (INDEPENDENT_AMBULATORY_CARE_PROVIDER_SITE_OTHER): Payer: BC Managed Care – PPO

## 2010-08-31 DIAGNOSIS — E109 Type 1 diabetes mellitus without complications: Secondary | ICD-10-CM

## 2010-08-31 DIAGNOSIS — Z0000 Encounter for general adult medical examination without abnormal findings: Secondary | ICD-10-CM

## 2010-08-31 LAB — CBC WITH DIFFERENTIAL/PLATELET
Basophils Relative: 0.8 % (ref 0.0–3.0)
Eosinophils Relative: 1.9 % (ref 0.0–5.0)
Lymphocytes Relative: 16.7 % (ref 12.0–46.0)
Neutrophils Relative %: 70.8 % (ref 43.0–77.0)
RBC: 4 Mil/uL (ref 3.87–5.11)
WBC: 4.5 10*3/uL (ref 4.5–10.5)

## 2010-08-31 LAB — BASIC METABOLIC PANEL
Calcium: 8.7 mg/dL (ref 8.4–10.5)
Creatinine, Ser: 0.6 mg/dL (ref 0.4–1.2)
GFR: 126.85 mL/min (ref >60.00)

## 2010-08-31 LAB — HEPATIC FUNCTION PANEL
ALT: 17 U/L (ref 0–35)
Bilirubin, Direct: 0.1 mg/dL (ref 0.0–0.3)
Total Bilirubin: 0.6 mg/dL (ref 0.3–1.2)

## 2010-08-31 LAB — URINALYSIS
Leukocytes, UA: NEGATIVE
Specific Gravity, Urine: 1.015 (ref 1.000–1.030)
Urine Glucose: NEGATIVE
Urobilinogen, UA: 0.2 (ref 0.0–1.0)

## 2010-08-31 LAB — HEMOGLOBIN A1C: Hgb A1c MFr Bld: 6.3 % (ref 4.6–6.5)

## 2010-08-31 LAB — LIPID PANEL
HDL: 67.3 mg/dL (ref >39.00)
LDL Cholesterol: 85 mg/dL (ref 0–99)
Total CHOL/HDL Ratio: 3
Triglycerides: 92 mg/dL (ref 0.0–149.0)
VLDL: 18.4 mg/dL (ref 0.0–40.0)

## 2010-09-14 ENCOUNTER — Encounter: Payer: Self-pay | Admitting: Endocrinology

## 2010-09-14 ENCOUNTER — Ambulatory Visit (INDEPENDENT_AMBULATORY_CARE_PROVIDER_SITE_OTHER)
Admission: RE | Admit: 2010-09-14 | Discharge: 2010-09-14 | Disposition: A | Discharge status: Home / Self Care | Payer: BC Managed Care – PPO | Source: Ambulatory Visit | Attending: Endocrinology | Admitting: Endocrinology

## 2010-09-14 ENCOUNTER — Ambulatory Visit (INDEPENDENT_AMBULATORY_CARE_PROVIDER_SITE_OTHER): Payer: BC Managed Care – PPO | Admitting: Endocrinology

## 2010-09-14 VITALS — BP 122/70 | HR 77 | Temp 99.3°F | Ht 67.0 in | Wt 149.4 lb

## 2010-09-14 DIAGNOSIS — Z79899 Other long term (current) drug therapy: Secondary | ICD-10-CM | POA: Insufficient documentation

## 2010-09-14 DIAGNOSIS — R05 Cough: Secondary | ICD-10-CM

## 2010-09-14 DIAGNOSIS — E7800 Pure hypercholesterolemia, unspecified: Secondary | ICD-10-CM

## 2010-09-14 DIAGNOSIS — E109 Type 1 diabetes mellitus without complications: Secondary | ICD-10-CM

## 2010-09-14 DIAGNOSIS — D696 Thrombocytopenia, unspecified: Secondary | ICD-10-CM

## 2010-09-14 DIAGNOSIS — R059 Cough, unspecified: Secondary | ICD-10-CM

## 2010-09-14 DIAGNOSIS — R319 Hematuria, unspecified: Secondary | ICD-10-CM

## 2010-09-14 MED ORDER — DOXYCYCLINE HYCLATE 100 MG PO TABS: 100.0000 mg | ORAL_TABLET | Freq: Two times a day (BID) | ORAL | Status: DC

## 2010-09-14 NOTE — Progress Notes (Signed)
Subjective:    Patient ID: Molly [name], female    DOB: 1970/08/19, 40 y.o.   MRN: [id number]  HPI Pt states 4 days of moderate congestion in the nose, and assoc prod-quality cough.  she is improving now, but she also has wheezing.  she brings a record of her cbg's which i have reviewed today.  she had 1 episode of mild hypoglycemia, and cbg of 300 once, during this illness.  Otherwise, she has frequent cbg's in the 50's, at various times of day.   Past Medical History  Diagnosis Date  . SMOKER 08/11/2009  . CONTUSION, ANKLE 08/28/2007  . SEBACEOUS CYST, INFECTED 07/13/2007  . TINEA VERSICOLOR 07/13/2007  . ALLERGIC RHINITIS CAUSE UNSPECIFIED 03/06/2007  . MIGRAINE, CHRONIC 03/06/2007  . DIABETES MELLITUS, TYPE I 08/03/2006  . DEPRESSION 08/03/2006  . THROMBOCYTOPENIA 05/07/2008  . Encounter for long-term (current) use of other medications   . DM retinopathy     Past Surgical History  Procedure Date  . Abnormal u/s 12/10/1996  . Electrocardiogram 06/13/2006    History   Social History  . Marital Status: Married    Spouse Name: N/A    Number of Children: N/A  . Years of Education: N/A   Occupational History  . Not on file.   Social History Main Topics  . Smoking status: Current Everyday Smoker  . Smokeless tobacco: Not on file  . Alcohol Use: No  . Drug Use: No  . Sexually Active: Not on file   Other Topics Concern  . Not on file   Social History Narrative   Does not work outside the home    Current Outpatient Prescriptions on File Prior to Visit  Medication Sig Dispense Refill  . carbamazepine (TEGRETOL XR) 200 MG 12 hr tablet Take 2 by mouth every morning daily       . glucose blood (FREESTYLE LITE) test strip Use as instructed 8/day dx 250.01, and lancets  800 each  3  . Insulin Infusion Pump Supplies (MINIMED INFUSION SET-MMT 398) MISC Inject 1 Device into the skin 3 days. Change every 3 days  30 each  3  . Insulin Infusion Pump Supplies (PARADIGM RESERVOIR )  MISC Inject 1 Device into the skin 3 days. Change every 3 days  30 each  3  . insulin lispro (HUMALOG) 100 UNIT/ML injection For use in pump, total 70 units/day       . lamoTRIgine (LAMICTAL) 200 MG tablet Take 200 mg by mouth daily.        . Lancets (FREESTYLE) lancets Use as instructed dx 250.01  800 each  3  . rosuvastatin (CRESTOR) 40 MG tablet 1 tablet at bedtime       . Transparent Dressings (OPSITE IV 3000) MISC Inject 1 Device into the skin 3 days. Change every 3 days  30 each  3  . Transparent Dressings (OPSITE IV 3000) MISC Inject 1 Device into the skin 3 days.  30 each  3  . ziprasidone (GEODON) 60 MG capsule Take 2 by mouth at bedtime         Allergies  Allergen Reactions  . Varenicline Tartrate     REACTION: rash    Family History  Problem Relation Age of Onset  . Diabetes Father     (Oral Agents)  . Cancer Neg Hx     BP 122/70  Pulse 77  Temp(Src) 99.3 F (37.4 C) (Oral)  Ht 5\' 7"  (1.702 m)  Wt 149 lb 6.4 oz (  67.767 kg)  BMI 23.40 kg/m2  SpO2 98%  LMP 08/13/2010  Review of Systems  Constitutional: Negative for unexpected weight change.  HENT: Negative for hearing loss.   Eyes: Negative for visual disturbance.  Cardiovascular: Negative for chest pain.  Gastrointestinal: Negative for anal bleeding.  Genitourinary: Negative for hematuria.  Musculoskeletal:       Few arthralgias diffusely throughout right hand  Skin: Negative for rash.  Neurological: Negative for numbness.  Hematological: Bruises/bleeds easily.  Psychiatric/Behavioral: Negative for dysphoric mood.   Denies fever and sob.    Objective:   Physical Exam GENERAL: no distress head: no deformity eyes: no periorbital swelling, no proptosis external nose and ears are normal mouth: no lesion seen Both eac's and tm's are normal LUNGS:  Clear to auscultation, except for diffuse wheezes.  Lab Results  Component Value Date   HGBA1C 6.3 08/31/2010   Lab Results  Component Value Date   WBC  4.5 08/31/2010   HGB 13.1 08/31/2010   HCT 39.3 08/31/2010   MCV 98.3 08/31/2010   PLT 111.0 Repeated and verified X2.* 08/31/2010      Assessment & Plan:  [place] Peers, slightly better.   Asthma, due to smoking and uri Dm, overcontrolled Low plts, unchanged

## 2010-09-14 NOTE — Patient Instructions (Addendum)
A chest x-ray, and blood tests are being ordered for you today.  please call [phone] to hear your test results.  You will be prompted to enter the 9-digit "MRN" number that appears at the top left of this page, followed by #.  Then you will hear the message. pending the test results, please continue the same medications for now. reduce basal rate to 0.9 unit/hr, 24 hrs per day.  decrease mealtime bolus to 1 unit/ 13 grams carbohydrate.  however, subtract 1 unit from calculated lunch bolus. continue correction bolus (which some people call "sensitivity," or "insulin sensitivity ratio," or just "isr") of 1 unit for each 50 by which your glucose exceeds 100. Please schedule a regular physical in 3 months. here is a sample of "advair-100."  take 1 puff 2x a day.  rinse mouth after using. i have sent a prescription to your pharmacy, for an antibiotic. We'll continue to follow the platelets.

## 2010-12-01 ENCOUNTER — Other Ambulatory Visit: Payer: Self-pay | Admitting: *Deleted

## 2010-12-01 DIAGNOSIS — E109 Type 1 diabetes mellitus without complications: Secondary | ICD-10-CM

## 2010-12-01 DIAGNOSIS — Z0000 Encounter for general adult medical examination without abnormal findings: Secondary | ICD-10-CM

## 2010-12-13 ENCOUNTER — Other Ambulatory Visit: Payer: BC Managed Care – PPO

## 2010-12-13 ENCOUNTER — Encounter: Payer: BC Managed Care – PPO | Admitting: Endocrinology

## 2011-02-23 ENCOUNTER — Ambulatory Visit: Payer: BC Managed Care – PPO | Admitting: Endocrinology

## 2011-02-24 ENCOUNTER — Ambulatory Visit: Payer: BC Managed Care – PPO | Admitting: Endocrinology

## 2011-02-28 ENCOUNTER — Ambulatory Visit (INDEPENDENT_AMBULATORY_CARE_PROVIDER_SITE_OTHER): Payer: BC Managed Care – PPO | Admitting: Endocrinology

## 2011-02-28 ENCOUNTER — Encounter: Payer: Self-pay | Admitting: Endocrinology

## 2011-02-28 VITALS — BP 120/62 | HR 69 | Temp 97.5°F | Wt 172.1 lb

## 2011-02-28 DIAGNOSIS — M810 Age-related osteoporosis without current pathological fracture: Secondary | ICD-10-CM

## 2011-02-28 DIAGNOSIS — E109 Type 1 diabetes mellitus without complications: Secondary | ICD-10-CM

## 2011-02-28 MED ORDER — GLUCOSE BLOOD VI STRP: ORAL_STRIP | Status: DC

## 2011-02-28 MED ORDER — FREESTYLE LANCETS MISC: Status: DC

## 2011-02-28 MED ORDER — OPSITE IV 3000 MISC: 1.0000 | Status: DC

## 2011-02-28 MED ORDER — ROSUVASTATIN CALCIUM 40 MG PO TABS: 40.0000 mg | ORAL_TABLET | Freq: Every day | ORAL | Status: DC

## 2011-02-28 MED ORDER — ALCOHOL PREP 70 % PADS: MEDICATED_PAD | Status: DC

## 2011-02-28 MED ORDER — INSULIN LISPRO 100 UNIT/ML ~~LOC~~ SOLN: SUBCUTANEOUS | Status: DC

## 2011-02-28 MED ORDER — PARADIGM PUMP RESERVOIR 3ML MISC: 1.0000 | Status: DC

## 2011-02-28 MED ORDER — "PARADIGM QUICK-SET 43"" 6MM MISC": 1.0000 | Status: DC

## 2011-02-28 NOTE — Patient Instructions (Addendum)
Your diabetes blood test is being ordered for you today.  please call [phone] to hear your test results.  You will be prompted to enter the 9-digit "MRN" number that appears at the top left of this page, followed by #.  Then you will hear the message. pending the test results, please continue the same medications for now. reduce basal rate to 0.9 unit/hr, 24 hrs per day.  decrease mealtime bolus to 1 unit/ 13 grams carbohydrate.  however, subtract 1 unit from calculated lunch bolus. continue correction bolus (which some people call "sensitivity," or "insulin sensitivity ratio," or just "isr") of 1 unit for each 50 by which your glucose exceeds 100. Please come back for a follow-up appointment in 3 months. (update: i left message on phone-tree:  Carefully watch for hypoglycemia)

## 2011-02-28 NOTE — Progress Notes (Signed)
  Subjective:    Patient ID: Molly [name], female    DOB: November 29, 1970, 41 y.o.   MRN: [id number]  HPI Pt returns for f/u of type 1 dm (1983).  pt states she feels well in general.  no cbg record, but states cbg's are well-controlled.  She seldom has hypoglycemia, and these episodes are mild.   osteoporosis was recently noted, after pt had fx right ankle.   Past Medical History  Diagnosis Date  . SMOKER 08/11/2009  . CONTUSION, ANKLE 08/28/2007  . SEBACEOUS CYST, INFECTED 07/13/2007  . TINEA VERSICOLOR 07/13/2007  . ALLERGIC RHINITIS CAUSE UNSPECIFIED 03/06/2007  . MIGRAINE, CHRONIC 03/06/2007  . DIABETES MELLITUS, TYPE I 08/03/2006  . DEPRESSION 08/03/2006  . THROMBOCYTOPENIA 05/07/2008  . Encounter for long-term (current) use of other medications   . DM retinopathy     Past Surgical History  Procedure Date  . Abnormal u/s 12/10/1996  . Electrocardiogram 06/13/2006    History   Social History  . Marital Status: Married    Spouse Name: N/A    Number of Children: N/A  . Years of Education: N/A   Occupational History  . Not on file.   Social History Main Topics  . Smoking status: Current Everyday Smoker  . Smokeless tobacco: Not on file  . Alcohol Use: No  . Drug Use: No  . Sexually Active: Not on file   Other Topics Concern  . Not on file   Social History Narrative   Does not work outside the home    Current Outpatient Prescriptions on File Prior to Visit  Medication Sig Dispense Refill  . carbamazepine (TEGRETOL XR) 200 MG 12 hr tablet Take 2 by mouth every morning daily       . lamoTRIgine (LAMICTAL) 200 MG tablet Take 200 mg by mouth daily.        . Transparent Dressings (OPSITE IV 3000) MISC Inject 1 Device into the skin 3 days.  30 each  3  . ziprasidone (GEODON) 60 MG capsule Take 2 by mouth at bedtime         Allergies  Allergen Reactions  . Varenicline Tartrate     REACTION: rash    Family History  Problem Relation Age of Onset  . Diabetes Father    (Oral Agents)  . Cancer Neg Hx     BP 120/62  Pulse 69  Temp(Src) 97.5 F (36.4 C) (Oral)  Wt 172 lb 1.9 oz (78.073 kg)  SpO2 99%   Review of Systems denies loc    Objective:   Physical Exam VITAL SIGNS:  See vs page GENERAL: no distress SKIN:  Insulin infusion sites at the anterior abdomen are normal. (left foot is in a boot--dr dean).    Lab Results  Component Value Date   HGBA1C 6.3 08/31/2010      Assessment & Plan:  Type 1 DM, well-controlled

## 2011-04-15 ENCOUNTER — Ambulatory Visit: Payer: BC Managed Care – PPO | Admitting: Endocrinology

## 2011-05-23 ENCOUNTER — Other Ambulatory Visit (INDEPENDENT_AMBULATORY_CARE_PROVIDER_SITE_OTHER): Payer: BC Managed Care – PPO

## 2011-05-23 DIAGNOSIS — E109 Type 1 diabetes mellitus without complications: Secondary | ICD-10-CM

## 2011-05-23 DIAGNOSIS — Z0000 Encounter for general adult medical examination without abnormal findings: Secondary | ICD-10-CM

## 2011-05-23 LAB — LIPID PANEL
HDL: 83.5 mg/dL (ref >39.00)
LDL Cholesterol: 75 mg/dL (ref 0–99)
Total CHOL/HDL Ratio: 2
VLDL: 9.2 mg/dL (ref 0.0–40.0)

## 2011-05-23 LAB — URINALYSIS, ROUTINE W REFLEX MICROSCOPIC
Bilirubin Urine: NEGATIVE
Hgb urine dipstick: NEGATIVE
Ketones, ur: NEGATIVE
Nitrite: NEGATIVE
Total Protein, Urine: NEGATIVE
Urine Glucose: NEGATIVE
pH: 6.5 (ref 5.0–8.0)

## 2011-05-23 LAB — MICROALBUMIN / CREATININE URINE RATIO
Creatinine,U: 43.1 mg/dL
Microalb, Ur: 0.3 mg/dL (ref 0.0–1.9)

## 2011-05-24 LAB — PTH, INTACT AND CALCIUM: Calcium, Total (PTH): 9.3 mg/dL (ref 8.4–10.5)

## 2011-05-30 ENCOUNTER — Ambulatory Visit (INDEPENDENT_AMBULATORY_CARE_PROVIDER_SITE_OTHER): Payer: BC Managed Care – PPO | Admitting: Endocrinology

## 2011-05-30 ENCOUNTER — Encounter: Payer: Self-pay | Admitting: Endocrinology

## 2011-05-30 VITALS — BP 122/78 | HR 64 | Temp 99.9°F | Ht 67.0 in | Wt 162.0 lb

## 2011-05-30 DIAGNOSIS — E109 Type 1 diabetes mellitus without complications: Secondary | ICD-10-CM

## 2011-05-30 NOTE — Progress Notes (Signed)
Subjective:    Patient ID: Molly [name], female    DOB: [birth year], 41 y.o.   MRN: [id number]  HPI  Pt returns for f/u of type 1 dm (dx'ed 1983--no known complications).  pt states she feels well in general.  she brings a record of her cbg's which i have reviewed today.  She has mild hypoglycemia a few times per week, at any time of day.  She has increased the basal rate to 1.2 units/hr, 6 am-11 am.  Her total daily insulin dosage is approx 40 units/day.   She says she cannot afford a continuous glucose monitor.  Past Medical History  Diagnosis Date  . SMOKER 08/11/2009  . CONTUSION, ANKLE 08/28/2007  . SEBACEOUS CYST, INFECTED 07/13/2007  . TINEA VERSICOLOR 07/13/2007  . ALLERGIC RHINITIS CAUSE UNSPECIFIED 03/06/2007  . MIGRAINE, CHRONIC 03/06/2007  . DIABETES MELLITUS, TYPE I 08/03/2006  . DEPRESSION 08/03/2006  . THROMBOCYTOPENIA 05/07/2008  . Encounter for long-term (current) use of other medications   . DM retinopathy     Past Surgical History  Procedure Date  . Abnormal u/s 12/10/1996  . Electrocardiogram 06/13/2006    History   Social History  . Marital Status: Married    Spouse Name: N/A    Number of Children: N/A  . Years of Education: N/A   Occupational History  . Not on file.   Social History Main Topics  . Smoking status: Current Everyday Smoker  . Smokeless tobacco: Not on file  . Alcohol Use: No  . Drug Use: No  . Sexually Active: Not on file   Other Topics Concern  . Not on file   Social History Narrative   Does not work outside the home    Current Outpatient Prescriptions on File Prior to Visit  Medication Sig Dispense Refill  . Alcohol Swabs (ALCOHOL PREP) 70 % PADS 8/day  250.01  800 each  3  . carbamazepine (TEGRETOL XR) 200 MG 12 hr tablet Take 2 by mouth every morning daily       . glucose blood (FREESTYLE LITE) test strip 8/day dx 250.01, and lancets  800 each  3  . Insulin Infusion Pump Supplies (MINIMED INFUSION SET-MMT 398) MISC Inject 1  Device into the skin 3 days. Change every 3 days  30 each  3  . Insulin Infusion Pump Supplies (PARADIGM RESERVOIR ) MISC Inject 1 Device into the skin 3 days. Change every 3 days  30 each  3  . insulin lispro (HUMALOG) 100 UNIT/ML injection For use in pump, total 70 units/day  70 mL  3  . lamoTRIgine (LAMICTAL) 200 MG tablet Take 200 mg by mouth daily.        . Lancets (FREESTYLE) lancets Use as instructed dx 250.01  800 each  3  . rosuvastatin (CRESTOR) 40 MG tablet Take 1 tablet (40 mg total) by mouth daily. 1 tablet at bedtime  90 tablet  3  . Transparent Dressings (OPSITE IV 3000) MISC Inject 1 Device into the skin 3 days.  30 each  3  . Transparent Dressings (OPSITE IV 3000) MISC Inject 1 Device into the skin 3 days. Change every 3 days  30 each  3  . ziprasidone (GEODON) 60 MG capsule Take 2 by mouth at bedtime         Allergies  Allergen Reactions  . Varenicline Tartrate     REACTION: rash    Family History  Problem Relation Age of Onset  . Diabetes Father     (  Oral Agents)  . Cancer Neg Hx     There were no vitals taken for this visit.  Review of Systems denies loc    Objective:   Physical Exam VITAL SIGNS:  See vs page GENERAL: no distress Pulses: dorsalis pedis intact bilat.   Feet: no deformity.  no ulcer on the feet.  feet are of normal color and temp.  no edema Neuro: sensation is intact to touch on the feet   Lab Results  Component Value Date   HGBA1C 6.2 05/23/2011      Assessment & Plan:  Type 1 DM, overcontrolled

## 2011-05-30 NOTE — Patient Instructions (Addendum)
check your blood sugar 8 times a day.  vary the time of day when you check, between before the 3 meals, and at bedtime.  also check if you have symptoms of your blood sugar being too high or too low.  please keep a record of the readings and bring it to your next appointment here.  please call us sooner if your blood sugar goes below 70, or if it stays over 200. reduce basal rate to 0.8 units/hr, except for 1.2 units/hr, 3 am to 6 am.   continue mealtime bolus of 1 unit/ 13 grams carbohydrate.  however, subtract 1 unit from calculated lunch bolus. continue correction bolus (which some people call "sensitivity," or "insulin sensitivity ratio," or just "isr") of 1 unit for each 50 by which your glucose exceeds 100. Please come back for a regular phyical appointment in 3 months.

## 2011-06-15 ENCOUNTER — Other Ambulatory Visit (HOSPITAL_COMMUNITY)
Admission: RE | Admit: 2011-06-15 | Discharge: 2011-06-15 | Disposition: A | Discharge status: Home / Self Care | Payer: BC Managed Care – PPO | Source: Ambulatory Visit | Attending: Gynecology | Admitting: Gynecology

## 2011-06-15 ENCOUNTER — Ambulatory Visit (INDEPENDENT_AMBULATORY_CARE_PROVIDER_SITE_OTHER): Payer: BC Managed Care – PPO | Admitting: Gynecology

## 2011-06-15 ENCOUNTER — Other Ambulatory Visit: Payer: Self-pay | Admitting: Gynecology

## 2011-06-15 ENCOUNTER — Telehealth: Payer: Self-pay | Admitting: *Deleted

## 2011-06-15 ENCOUNTER — Encounter: Payer: Self-pay | Admitting: Gynecology

## 2011-06-15 VITALS — BP 128/76 | Ht 66.75 in | Wt 160.0 lb

## 2011-06-15 DIAGNOSIS — Z01419 Encounter for gynecological examination (general) (routine) without abnormal findings: Secondary | ICD-10-CM

## 2011-06-15 DIAGNOSIS — Z1231 Encounter for screening mammogram for malignant neoplasm of breast: Secondary | ICD-10-CM

## 2011-06-15 DIAGNOSIS — L538 Other specified erythematous conditions: Secondary | ICD-10-CM

## 2011-06-15 DIAGNOSIS — L293 Anogenital pruritus, unspecified: Secondary | ICD-10-CM

## 2011-06-15 DIAGNOSIS — N898 Other specified noninflammatory disorders of vagina: Secondary | ICD-10-CM

## 2011-06-15 DIAGNOSIS — L304 Erythema intertrigo: Secondary | ICD-10-CM

## 2011-06-15 LAB — WET PREP FOR TRICH, YEAST, CLUE
Clue Cells Wet Prep HPF POC: NONE SEEN
Trich, Wet Prep: NONE SEEN
WBC, Wet Prep HPF POC: NONE SEEN
Yeast Wet Prep HPF POC: NONE SEEN

## 2011-06-15 MED ORDER — NYSTATIN-TRIAMCINOLONE 100000-0.1 UNIT/GM-% EX CREA: TOPICAL_CREAM | Freq: Three times a day (TID) | CUTANEOUS | Status: DC

## 2011-06-15 MED ORDER — NYSTATIN-TRIAMCINOLONE 100000-0.1 UNIT/GM-% EX CREA: TOPICAL_CREAM | Freq: Three times a day (TID) | CUTANEOUS | Status: AC

## 2011-06-15 NOTE — Progress Notes (Addendum)
Molly [name] rork Dec 06, 1970 [id number]   History:    41 y.o.  who presented to the office today for annual gynecological exam. Patient's main complaint has been provided as in the mons pubis area. And questionable light vaginal discharge. Patient is a type I diabetic who is being followed by Dr. Everardo All. He is also monitor her hyperlipidemia. Lab work was drawn at his office a few months ago. Patient's had a previous tubal sterilization procedure. Patient is having normal menstrual cycles. Patient had a Pap smear in 2009 in another facility. And her mammogram was done in 2011. She states she does her monthly self breast examination.  Patient had a foot fracture recently and was informed that she had "osteoporosis". Review of her bone density study done in 2012 demonstrated that her AP spine had a Z. score of -3.1 and right femoral neck is T score of -1.6. She scheduled for bone density study next year. She is taking calcium and vitamin D. Review of her lab results from Dr. George Hugh office recently indicated she had a normal calcium and PTH level.  Past medical history,surgical history, family history and social history were all reviewed and documented in the EPIC chart.  Gynecologic History Patient's last menstrual period was 05/25/2011. Contraception: tubal ligation Last Pap: 2009. Results were: normal Last mammogram: 2011. Results were: normal  Obstetric History OB History    Grav Para Term Preterm Abortions TAB SAB Ect Mult Living   4 2 1 1 2  2   2      # Outc Date GA Lbr Len/2nd Wgt Sex Del Anes PTL Lv   1 PRE     F CS  Yes Yes   2 TRM     M   No Yes   3 SAB            4 SAB                ROS: A ROS was performed and pertinent positives and negatives are included in the history.  GENERAL: No fevers or chills. HEENT: No change in vision, no earache, sore throat or sinus congestion. NECK: No pain or stiffness. CARDIOVASCULAR: No chest pain or pressure. No palpitations. PULMONARY: No  shortness of breath, cough or wheeze. GASTROINTESTINAL: No abdominal pain, nausea, vomiting or diarrhea, melena or bright red blood per rectum. GENITOURINARY: No urinary frequency, urgency, hesitancy or dysuria. MUSCULOSKELETAL: No joint or muscle pain, no back pain, no recent trauma. DERMATOLOGIC: No rash, no itching, no lesions. ENDOCRINE: No polyuria, polydipsia, no heat or cold intolerance. No recent change in weight. HEMATOLOGICAL: No anemia or easy bruising or bleeding. NEUROLOGIC: No headache, seizures, numbness, tingling or weakness. PSYCHIATRIC: No depression, no loss of interest in normal activity or change in sleep pattern.     Exam: chaperone present  BP 128/76  Ht 5' 6.75" (1.695 m)  Wt 160 lb (72.576 kg)  BMI 25.25 kg/m2  LMP 05/25/2011  Body mass index is 25.25 kg/(m^2).  General appearance : Well developed well nourished female. No acute distress HEENT: Neck supple, trachea midline, no carotid bruits, no thyroidmegaly Lungs: Clear to auscultation, no rhonchi or wheezes, or rib retractions  Heart: Regular rate and rhythm, no murmurs or gallops Breast:Examined in sitting and supine position were symmetrical in appearance, no palpable masses or tenderness,  no skin retraction, no nipple inversion, no nipple discharge, no skin discoloration, no axillary or supraclavicular lymphadenopathy Abdomen: no palpable masses or tenderness, no rebound or guarding Extremities: no  edema or skin discoloration or tenderness  Pelvic: Mons pubis region erythematous and slightly scaly  Bartholin, Urethra, Skene Glands: Within normal limits             Vagina: No gross lesions or discharge  Cervix: No gross lesions or discharge  Uterus  anteverted, normal size, shape and consistency, non-tender and mobile  Adnexa  Without masses or tenderness  Anus and perineum  normal   Rectovaginal  normal sphincter tone without palpated masses or tenderness             Hemoccult not done      Assessment/Plan:  41 y.o. female for annual exam with apparent intertrigo. She will be placed on mytrex cream to apply twice a day for 7-10 days. She was given a requisition to schedule her mammogram. Patient encouraged to continue to take her calcium and vitamin D, along with doing her breast exam once a month. Pap smear done today. New Pap smear screening guidelines discussed.  Addendum: Vaginal wet prep was negative  Ok Edwards MD, 9:02 AM 06/15/2011

## 2011-06-15 NOTE — Telephone Encounter (Signed)
Pt called stating pharmacy never got rx for mycolog cream, rx sent to correct pharmacy [facility], pt informed as well.

## 2011-06-15 NOTE — Patient Instructions (Signed)
Please remember to schedule your mammogram . Your prescription is in the pharmacy. Please take vitamin D3 1,000 units (one tablet daily)

## 2011-06-28 ENCOUNTER — Ambulatory Visit
Admission: RE | Admit: 2011-06-28 | Discharge: 2011-06-28 | Disposition: A | Discharge status: Home / Self Care | Payer: BC Managed Care – PPO | Source: Ambulatory Visit | Attending: Gynecology | Admitting: Gynecology

## 2011-06-28 DIAGNOSIS — Z1231 Encounter for screening mammogram for malignant neoplasm of breast: Secondary | ICD-10-CM

## 2011-07-05 ENCOUNTER — Other Ambulatory Visit: Payer: Self-pay | Admitting: *Deleted

## 2011-07-05 DIAGNOSIS — R921 Mammographic calcification found on diagnostic imaging of breast: Secondary | ICD-10-CM

## 2011-07-11 ENCOUNTER — Ambulatory Visit
Admission: RE | Admit: 2011-07-11 | Discharge: 2011-07-11 | Disposition: A | Discharge status: Home / Self Care | Payer: BC Managed Care – PPO | Source: Ambulatory Visit | Attending: Gynecology | Admitting: Gynecology

## 2011-07-11 DIAGNOSIS — R921 Mammographic calcification found on diagnostic imaging of breast: Secondary | ICD-10-CM

## 2011-08-29 ENCOUNTER — Encounter: Payer: Self-pay | Admitting: Endocrinology

## 2011-08-29 ENCOUNTER — Other Ambulatory Visit (INDEPENDENT_AMBULATORY_CARE_PROVIDER_SITE_OTHER): Payer: BC Managed Care – PPO

## 2011-08-29 ENCOUNTER — Ambulatory Visit (INDEPENDENT_AMBULATORY_CARE_PROVIDER_SITE_OTHER): Payer: BC Managed Care – PPO | Admitting: Endocrinology

## 2011-08-29 VITALS — BP 122/68 | HR 70 | Temp 98.4°F | Ht 67.0 in | Wt 154.0 lb

## 2011-08-29 DIAGNOSIS — Z0000 Encounter for general adult medical examination without abnormal findings: Secondary | ICD-10-CM

## 2011-08-29 DIAGNOSIS — E109 Type 1 diabetes mellitus without complications: Secondary | ICD-10-CM

## 2011-08-29 LAB — TSH: TSH: 1.59 u[IU]/mL (ref 0.35–5.50)

## 2011-08-29 LAB — URINALYSIS, ROUTINE W REFLEX MICROSCOPIC
Ketones, ur: NEGATIVE
Specific Gravity, Urine: 1.015 (ref 1.000–1.030)
Total Protein, Urine: NEGATIVE
Urine Glucose: >=1000

## 2011-08-29 LAB — BASIC METABOLIC PANEL
Calcium: 9.1 mg/dL (ref 8.4–10.5)
Chloride: 101 mEq/L (ref 96–112)
Creatinine, Ser: 0.6 mg/dL (ref 0.4–1.2)
Sodium: 135 mEq/L (ref 135–145)

## 2011-08-29 LAB — CBC WITH DIFFERENTIAL/PLATELET
Basophils Absolute: 0 10*3/uL (ref 0.0–0.1)
Eosinophils Absolute: 0 10*3/uL (ref 0.0–0.7)
Hemoglobin: 12.9 g/dL (ref 12.0–15.0)
Lymphocytes Relative: 17 % (ref 12.0–46.0)
MCHC: 33.3 g/dL (ref 30.0–36.0)
Monocytes Relative: 8.8 % (ref 3.0–12.0)
Neutro Abs: 2.5 10*3/uL (ref 1.4–7.7)
Platelets: 123 10*3/uL — ABNORMAL LOW (ref 150.0–400.0)
RDW: 12.6 % (ref 11.5–14.6)

## 2011-08-29 LAB — HEPATIC FUNCTION PANEL
ALT: 15 U/L (ref 0–35)
Alkaline Phosphatase: 44 U/L (ref 39–117)
Bilirubin, Direct: 0.1 mg/dL (ref 0.0–0.3)
Total Bilirubin: 0.5 mg/dL (ref 0.3–1.2)

## 2011-08-29 LAB — MICROALBUMIN / CREATININE URINE RATIO
Creatinine,U: 69.9 mg/dL
Microalb Creat Ratio: 0.3 mg/g (ref 0.0–30.0)
Microalb, Ur: 0.2 mg/dL (ref 0.0–1.9)

## 2011-08-29 LAB — LIPID PANEL: Total CHOL/HDL Ratio: 2

## 2011-08-29 NOTE — Patient Instructions (Addendum)
check your blood sugar 8 times a day.  vary the time of day when you check, between before the 3 meals, and at bedtime.  also check if you have symptoms of your blood sugar being too high or too low.  please keep a record of the readings and bring it to your next appointment here.  please call us sooner if your blood sugar goes below 70, or if it stays over 200.  reduce basal rate to 0.8 units/hr, except for 1.2 units/hr, 3 am to 6 am.    continue mealtime bolus of 1 unit/ 13 grams carbohydrate.  however, subtract 1 unit from calculated lunch bolus.  Add 3 units to calculated breakfast bolus.   continue correction bolus (which some people call "sensitivity," or "insulin sensitivity ratio," or just "isr") of 1 unit for each 50 by which your glucose exceeds 100. Please come back for a follow-up appointment in 3 months.   blood tests are being requested for you today.  You will receive a letter with results. please consider these measures for your health:  minimize alcohol.  do not use tobacco products.  have a colonoscopy at least every 10 years from age 22.  Women should have an annual mammogram from age 71.  keep firearms safely stored.  always use seat belts.  have working smoke alarms in your home.  see an eye doctor and dentist regularly.  never drive under the influence of alcohol or drugs (including prescription drugs).  those with fair skin should take precautions against the sun.

## 2011-08-29 NOTE — Progress Notes (Signed)
Subjective:    Patient ID: Molly [name], female    DOB: 05-Dec-1970, 41 y.o.   MRN: [id number]  HPI here for regular wellness examination.  He's feeling pretty well in general, and says chronic med probs are stable, except as noted below.  Pt has had to reduce insulin due to weight loss.  she brings a record of her cbg's which i have reviewed today. Past Medical History  Diagnosis Date  . SMOKER 08/11/2009  . CONTUSION, ANKLE 08/28/2007  . SEBACEOUS CYST, INFECTED 07/13/2007  . TINEA VERSICOLOR 07/13/2007  . ALLERGIC RHINITIS CAUSE UNSPECIFIED 03/06/2007  . MIGRAINE, CHRONIC 03/06/2007  . DIABETES MELLITUS, TYPE I 08/03/2006  . DEPRESSION 08/03/2006  . THROMBOCYTOPENIA 05/07/2008  . Encounter for long-term (current) use of other medications   . DM retinopathy   . Bipolar 1 disorder     Past Surgical History  Procedure Date  . Abnormal u/s 12/10/1996  . Electrocardiogram 06/13/2006  . Tubal ligation     History   Social History  . Marital Status: Married    Spouse Name: N/A    Number of Children: N/A  . Years of Education: N/A   Occupational History  . Not on file.   Social History Main Topics  . Smoking status: Former Smoker    Quit date: 10/16/2010  . Smokeless tobacco: Never Used  . Alcohol Use: No  . Drug Use: No  . Sexually Active: Yes     tubal ligation   Other Topics Concern  . Not on file   Social History Narrative   Does not work outside the home    Current Outpatient Prescriptions on File Prior to Visit  Medication Sig Dispense Refill  . Alcohol Swabs (ALCOHOL PREP) 70 % PADS 8/day  250.01  800 each  3  . carbamazepine (TEGRETOL XR) 200 MG 12 hr tablet Take 2 by mouth every morning daily       . glucose blood (FREESTYLE LITE) test strip 8/day dx 250.01, and lancets  800 each  3  . Insulin Infusion Pump Supplies (MINIMED INFUSION SET-MMT 398) MISC Inject 1 Device into the skin 3 days. Change every 3 days  30 each  3  . Insulin Infusion Pump Supplies  (PARADIGM RESERVOIR ) MISC Inject 1 Device into the skin 3 days. Change every 3 days  30 each  3  . insulin lispro (HUMALOG) 100 UNIT/ML injection For use in pump, total 70 units/day  70 mL  3  . lamoTRIgine (LAMICTAL) 200 MG tablet Take 200 mg by mouth 2 (two) times daily.       . Lancets (FREESTYLE) lancets Use as instructed dx 250.01  800 each  3  . rosuvastatin (CRESTOR) 40 MG tablet Take 1 tablet (40 mg total) by mouth daily. 1 tablet at bedtime  90 tablet  3  . Transparent Dressings (OPSITE IV 3000) MISC Inject 1 Device into the skin 3 days.  30 each  3  . Transparent Dressings (OPSITE IV 3000) MISC Inject 1 Device into the skin 3 days. Change every 3 days  30 each  3  . ziprasidone (GEODON) 60 MG capsule Take 2 by mouth at bedtime       . nystatin-triamcinolone (MYCOLOG II) cream Apply topically 3 (three) times daily.  30 g  2    Allergies  Allergen Reactions  . Varenicline Tartrate     REACTION: rash    Family History  Problem Relation Age of Onset  . Diabetes Father     (  Oral Agents)  . Diabetes Paternal Grandmother   . Hypertension Paternal Grandmother   . Diabetes Paternal Grandfather   . Cancer Paternal Grandfather     colon  . Hypertension Paternal Grandfather     BP 122/68  Pulse 70  Temp 98.4 F (36.9 C) (Oral)  Ht 5\' 7"  (1.702 m)  Wt 154 lb (69.854 kg)  BMI 24.12 kg/m2  SpO2 97%  LMP 07/25/2011   Review of Systems  Constitutional: Positive for unexpected weight change. Negative for fever.  HENT: Negative for hearing loss.   Eyes: Negative for visual disturbance.  Respiratory: Negative for shortness of breath.   Cardiovascular: Negative for chest pain.  Gastrointestinal: Negative for anal bleeding.  Genitourinary: Negative for hematuria.  Musculoskeletal: Negative for back pain.  Skin:       No change from chronic rash  Neurological: Negative for syncope, numbness and headaches.  Hematological: Does not bruise/bleed easily.    Psychiatric/Behavioral:       Depression due to husband's recent sudden death.  She sees dr Evelene Croon for this.      Objective:   Physical Exam VS: see vs page GEN: no distress HEAD: head: no deformity eyes: no periorbital swelling, no proptosis external nose and ears are normal mouth: no lesion seen NECK: supple, thyroid is not enlarged CHEST WALL: no deformity LUNGS:  Clear to auscultation BREASTS:  No mass.  No d/c CV: reg rate and rhythm, no murmur ABD: abdomen is soft, nontender.  no hepatosplenomegaly.  not distended.  no hernia GENITALIA:  Normal external female.  Normal bimanual exam RECTAL: normal external and internal exam.  heme neg MUSCULOSKELETAL: muscle bulk and strength are grossly normal.  no obvious joint swelling.  gait is normal and steady EXTEMITIES: no deformity.  no ulcer on the feet.  feet are of normal color and temp.  no edema PULSES: dorsalis pedis intact bilat.  no carotid bruit NEURO:  cn 2-12 grossly intact.   readily moves all 4's.  sensation is intact to touch on the feet SKIN:  Normal texture and temperature.  No rash or suspicious lesion is visible.   NODES:  None palpable at the neck PSYCH: alert, oriented x3.  Does not appear anxious nor depressed.  Lab Results  Component Value Date   WBC 3.5* 08/29/2011   HGB 12.9 08/29/2011   HCT 38.9 08/29/2011   PLT 123.0* 08/29/2011   GLUCOSE 208* 08/29/2011   CHOL 171 08/29/2011   TRIG 39.0 08/29/2011   HDL 78.70 08/29/2011   LDLDIRECT 141.4 04/24/2008   LDLCALC 85 08/29/2011   ALT 15 08/29/2011   AST 17 08/29/2011   NA 135 08/29/2011   K 4.4 08/29/2011   CL 101 08/29/2011   CREATININE 0.6 08/29/2011   BUN 8 08/29/2011   CO2 28 08/29/2011   TSH 1.59 08/29/2011   INR 1.05 11/28/2009   HGBA1C 6.6* 08/29/2011   MICROALBUR 0.2 08/29/2011      Assessment & Plan:  Wellness visit today, with problems stable, except as noted.

## 2011-08-31 ENCOUNTER — Telehealth: Payer: Self-pay | Admitting: *Deleted

## 2011-08-31 NOTE — Telephone Encounter (Signed)
Called pt to inform of lab results, pt informed via VM and to callback office with any questions/concerns (letter also mailed to pt).  

## 2011-09-20 ENCOUNTER — Encounter: Payer: Self-pay | Admitting: Endocrinology

## 2011-09-21 ENCOUNTER — Telehealth: Payer: Self-pay | Admitting: Endocrinology

## 2011-09-21 NOTE — Telephone Encounter (Signed)
HIM received a handwritten records request for questions to be answered and records to be sent to DME provider to pay for diabetic supplies. Called patient to get additional information.  Letter from Dr. Everardo All and last office visit from 08/29/2011 faxed to [facility] ([facility]) at [phone], [address] [place], [place] [address] attention Gerline [place] phone [phone]. Called patient to update her of progress.

## 2011-10-27 ENCOUNTER — Other Ambulatory Visit: Payer: Self-pay | Admitting: General Practice

## 2011-10-27 MED ORDER — "PARADIGM QUICK-SET 43"" 6MM MISC": 1.0000 | Status: DC

## 2011-10-27 MED ORDER — ALCOHOL PREP 70 % PADS: MEDICATED_PAD | Status: AC

## 2011-10-27 MED ORDER — PARADIGM PUMP RESERVOIR 3ML MISC: 1.0000 | Status: DC

## 2011-10-27 MED ORDER — FREESTYLE LANCETS MISC: Status: DC

## 2011-11-16 ENCOUNTER — Encounter: Payer: Self-pay | Admitting: Pulmonary Disease

## 2011-12-01 ENCOUNTER — Encounter: Payer: Self-pay | Admitting: Endocrinology

## 2011-12-01 ENCOUNTER — Ambulatory Visit (INDEPENDENT_AMBULATORY_CARE_PROVIDER_SITE_OTHER): Payer: BC Managed Care – PPO | Admitting: Endocrinology

## 2011-12-01 VITALS — BP 122/80 | HR 77 | Temp 98.5°F | Wt 159.0 lb

## 2011-12-01 DIAGNOSIS — E109 Type 1 diabetes mellitus without complications: Secondary | ICD-10-CM

## 2011-12-01 DIAGNOSIS — M25542 Pain in joints of left hand: Secondary | ICD-10-CM | POA: Insufficient documentation

## 2011-12-01 DIAGNOSIS — Z23 Encounter for immunization: Secondary | ICD-10-CM

## 2011-12-01 DIAGNOSIS — M25549 Pain in joints of unspecified hand: Secondary | ICD-10-CM

## 2011-12-01 LAB — SEDIMENTATION RATE: Sed Rate: 1 mm/hr (ref 0–22)

## 2011-12-01 LAB — RHEUMATOID FACTOR: Rhuematoid fact SerPl-aCnc: <10 IU/mL (ref <=14)
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
## 2011-12-01 NOTE — Patient Instructions (Addendum)
check your blood sugar 8 times a day.  vary the time of day when you check, between before the 3 meals, and at bedtime.  also check if you have symptoms of your blood sugar being too high or too low.  please keep a record of the readings and bring it to your next appointment here.  please call us sooner if your blood sugar goes below 70, or if it stays over 200.  reduce basal rate to 0.8 units/hr, except for 1 unit/hr, 3 am to 6 am.    continue mealtime bolus of 1 unit/ 13 grams carbohydrate.  however, subtract 1 unit from calculated lunch bolus.  Add 3 units to calculated breakfast bolus.   continue correction bolus (which some people call "sensitivity," or "insulin sensitivity ratio," or just "isr") of 1 unit for each 50 by which your glucose exceeds 100. Please come back for a follow-up appointment in 3 months.   blood tests are being requested for you today.  We'll contact you with results.     Please let me know if you want to see a hand specialist.

## 2012-03-02 ENCOUNTER — Ambulatory Visit: Payer: BC Managed Care – PPO | Admitting: Endocrinology

## 2012-03-05 ENCOUNTER — Ambulatory Visit (INDEPENDENT_AMBULATORY_CARE_PROVIDER_SITE_OTHER): Payer: BC Managed Care – PPO | Admitting: Endocrinology

## 2012-03-05 VITALS — BP 132/80 | HR 80 | Wt 162.0 lb

## 2012-03-05 DIAGNOSIS — E109 Type 1 diabetes mellitus without complications: Secondary | ICD-10-CM

## 2012-03-05 LAB — HEMOGLOBIN A1C: Hgb A1c MFr Bld: 7.1 % — ABNORMAL HIGH (ref 4.6–6.5)
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
## 2012-03-05 NOTE — Patient Instructions (Addendum)
check your blood sugar 8 times a day.  vary the time of day when you check, between before the 3 meals, and at bedtime.  also check if you have symptoms of your blood sugar being too high or too low.  please keep a record of the readings and bring it to your next appointment here.  please call us sooner if your blood sugar goes below 70, or if it stays over 200.  Please continue a basal rate to 0.9 units/hr, 24 hrs per day.  continue mealtime bolus of 1 unit/ 13 grams carbohydrate.  however, subtract 1 unit from calculated lunch bolus.  Add 3 units to calculated breakfast bolus.   continue correction bolus (which some people call "sensitivity," or "insulin sensitivity ratio," or just "isr") of 1 unit for each 50 by which your glucose exceeds 100. Please come back for a follow-up appointment in 3 months.   blood tests are being requested for you today.  We'll contact you with results.

## 2012-04-10 ENCOUNTER — Other Ambulatory Visit: Payer: Self-pay | Admitting: *Deleted

## 2012-04-10 MED ORDER — ROSUVASTATIN CALCIUM 40 MG PO TABS: 40.0000 mg | ORAL_TABLET | Freq: Every day | ORAL | Status: DC

## 2012-04-24 ENCOUNTER — Other Ambulatory Visit: Payer: Self-pay | Admitting: *Deleted

## 2012-04-24 ENCOUNTER — Ambulatory Visit (INDEPENDENT_AMBULATORY_CARE_PROVIDER_SITE_OTHER): Payer: BC Managed Care – PPO | Admitting: Endocrinology

## 2012-04-24 ENCOUNTER — Encounter: Payer: Self-pay | Admitting: Endocrinology

## 2012-04-24 VITALS — BP 128/68 | HR 80 | Wt 163.0 lb

## 2012-04-24 DIAGNOSIS — Z87891 Personal history of nicotine dependence: Secondary | ICD-10-CM | POA: Insufficient documentation

## 2012-04-24 DIAGNOSIS — J069 Acute upper respiratory infection, unspecified: Secondary | ICD-10-CM

## 2012-04-24 MED ORDER — INSULIN LISPRO 100 UNIT/ML ~~LOC~~ SOLN: SUBCUTANEOUS | Status: DC

## 2012-04-24 MED ORDER — CEFUROXIME AXETIL 250 MG PO TABS: 250.0000 mg | ORAL_TABLET | Freq: Two times a day (BID) | ORAL | Status: AC

## 2012-04-24 NOTE — Progress Notes (Signed)
Subjective:    Patient ID: Molly [name], female    DOB: [birth year], 42 y.o.   MRN: [id number]  HPI Pt states 1 week of slight prod-quality cough in the chest, and assoc nasal congestion.   Past Medical History  Diagnosis Date  . SMOKER 08/11/2009  . CONTUSION, ANKLE 08/28/2007  . SEBACEOUS CYST, INFECTED 07/13/2007  . TINEA VERSICOLOR 07/13/2007  . ALLERGIC RHINITIS CAUSE UNSPECIFIED 03/06/2007  . MIGRAINE, CHRONIC 03/06/2007  . DIABETES MELLITUS, TYPE I 08/03/2006  . DEPRESSION 08/03/2006  . THROMBOCYTOPENIA 05/07/2008  . Encounter for long-term (current) use of other medications   . DM retinopathy   . Bipolar 1 disorder     Past Surgical History  Procedure Laterality Date  . Abnormal u/s  12/10/1996  . Electrocardiogram  06/13/2006  . Tubal ligation      History   Social History  . Marital Status: Married    Spouse Name: N/A    Number of Children: N/A  . Years of Education: N/A   Occupational History  . Not on file.   Social History Main Topics  . Smoking status: Former Smoker    Quit date: 10/16/2010  . Smokeless tobacco: Never Used  . Alcohol Use: No  . Drug Use: No  . Sexually Active: Yes     Comment: tubal ligation   Other Topics Concern  . Not on file   Social History Narrative   Does not work outside the home    Current Outpatient Prescriptions on File Prior to Visit  Medication Sig Dispense Refill  . Alcohol Swabs (ALCOHOL PREP) 70 % PADS 8/day  250.01  800 each  3  . carbamazepine (TEGRETOL XR) 200 MG 12 hr tablet Take 2 by mouth every morning daily       . glucose blood (FREESTYLE LITE) test strip 8/day dx 250.01, and lancets  800 each  3  . Insulin Infusion Pump Supplies (MINIMED INFUSION SET-MMT 398) MISC Inject 1 Device into the skin 3 days. Change every 3 days  30 each  3  . Insulin Infusion Pump Supplies (PARADIGM RESERVOIR ) MISC Inject 1 Device into the skin 3 days. Change every 3 days  30 each  3  . lamoTRIgine (LAMICTAL) 200 MG tablet Take  200 mg by mouth 2 (two) times daily.       . Lancets (FREESTYLE) lancets Use as instructed dx 250.01  800 each  3  . nystatin-triamcinolone (MYCOLOG II) cream Apply topically 3 (three) times daily.  30 g  2  . rosuvastatin (CRESTOR) 40 MG tablet Take 1 tablet (40 mg total) by mouth daily. 1 tablet at bedtime  90 tablet  3  . Transparent Dressings (OPSITE IV 3000) MISC Inject 1 Device into the skin 3 days.  30 each  3  . Transparent Dressings (OPSITE IV 3000) MISC Inject 1 Device into the skin 3 days. Change every 3 days  30 each  3  . ziprasidone (GEODON) 60 MG capsule Take 2 by mouth at bedtime        No current facility-administered medications on file prior to visit.    Allergies  Allergen Reactions  . Varenicline Tartrate     REACTION: rash    Family History  Problem Relation Age of Onset  . Diabetes Father     (Oral Agents)  . Diabetes Paternal Grandmother   . Hypertension Paternal Grandmother   . Diabetes Paternal Grandfather   . Cancer Paternal Grandfather     colon  .  Hypertension Paternal Grandfather     BP 128/68  Pulse 80  Wt 163 lb (73.936 kg)  BMI 25.52 kg/m2  SpO2 98%    Review of Systems She also has rhinorrhea, but no fever.   No wheezing.      Objective:   Physical Exam VITAL SIGNS:  See vs page GENERAL: no distress head: no deformity eyes: no periorbital swelling, no proptosis external nose and ears are normal mouth: no lesion seen. Both eac's and tm's are slightly red. LUNGS:  Clear to auscultation.       Assessment & Plan:  URI, new

## 2012-04-24 NOTE — Telephone Encounter (Signed)
Previous rx did not go through

## 2012-04-24 NOTE — Patient Instructions (Addendum)
i have sent a prescription to your pharmacy, for an antibiotic pill Loratadine-d (non-prescription) will help your congestion. I hope you feel better soon.  If you don't feel better by next week, please call back. 

## 2012-05-16 ENCOUNTER — Telehealth: Payer: Self-pay

## 2012-05-16 NOTE — Telephone Encounter (Signed)
719.44 

## 2012-05-16 NOTE — Telephone Encounter (Signed)
Lab advised 

## 2012-05-16 NOTE — Telephone Encounter (Signed)
Molly [name] with [facility] lab called  Pt had sed rate on 12/01/11 they need dx code [phone]

## 2012-05-22 ENCOUNTER — Other Ambulatory Visit: Payer: Self-pay | Admitting: *Deleted

## 2012-05-22 DIAGNOSIS — R921 Mammographic calcification found on diagnostic imaging of breast: Secondary | ICD-10-CM

## 2012-05-23 NOTE — Addendum Note (Signed)
Addended by: Aura Camps on: 05/23/2012 11:49 AM   Modules accepted: Orders

## 2012-05-30 ENCOUNTER — Encounter: Payer: Self-pay | Admitting: Endocrinology

## 2012-05-30 ENCOUNTER — Ambulatory Visit (INDEPENDENT_AMBULATORY_CARE_PROVIDER_SITE_OTHER): Payer: BC Managed Care – PPO | Admitting: Endocrinology

## 2012-05-30 VITALS — BP 124/70 | HR 65 | Temp 98.7°F | Ht 67.0 in | Wt 164.0 lb

## 2012-05-30 DIAGNOSIS — E109 Type 1 diabetes mellitus without complications: Secondary | ICD-10-CM

## 2012-05-30 NOTE — Patient Instructions (Addendum)
check your blood sugar 8 times a day.  vary the time of day when you check, between before the 3 meals, and at bedtime.  also check if you have symptoms of your blood sugar being too high or too low.  please keep a record of the readings and bring it to your next appointment here.  please call us sooner if your blood sugar goes below 70, or if it stays over 200.  Please continue a basal rate to 0.9 units/hr, 24 hrs per day.  continue mealtime bolus of 1 unit/ 13 grams carbohydrate.  however, subtract 1 unit from calculated lunch bolus.  Add 3 units to calculated breakfast bolus.   continue correction bolus (which some people call "sensitivity," or "insulin sensitivity ratio," or just "isr") of 1 unit for each 50 by which your glucose exceeds 100. Please come back for a follow-up appointment in 3 months.   blood tests are being requested for you today.  We'll contact you with results.
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
## 2012-06-29 ENCOUNTER — Ambulatory Visit
Admission: RE | Admit: 2012-06-29 | Discharge: 2012-06-29 | Disposition: A | Discharge status: Home / Self Care | Payer: BC Managed Care – PPO | Source: Ambulatory Visit | Attending: Gynecology | Admitting: Gynecology

## 2012-06-29 DIAGNOSIS — R921 Mammographic calcification found on diagnostic imaging of breast: Secondary | ICD-10-CM

## 2012-07-04 ENCOUNTER — Ambulatory Visit: Payer: BC Managed Care – PPO | Admitting: Endocrinology

## 2012-08-06 ENCOUNTER — Ambulatory Visit (INDEPENDENT_AMBULATORY_CARE_PROVIDER_SITE_OTHER): Payer: BC Managed Care – PPO | Admitting: Endocrinology

## 2012-08-06 ENCOUNTER — Encounter: Payer: Self-pay | Admitting: Endocrinology

## 2012-08-06 ENCOUNTER — Ambulatory Visit
Admission: RE | Admit: 2012-08-06 | Discharge: 2012-08-06 | Disposition: A | Discharge status: Home / Self Care | Payer: BC Managed Care – PPO | Source: Ambulatory Visit | Attending: Endocrinology | Admitting: Endocrinology

## 2012-08-06 VITALS — BP 126/70 | HR 64 | Ht 67.0 in | Wt 169.0 lb

## 2012-08-06 DIAGNOSIS — S9030XA Contusion of unspecified foot, initial encounter: Secondary | ICD-10-CM

## 2012-08-06 DIAGNOSIS — S9032XA Contusion of left foot, initial encounter: Secondary | ICD-10-CM

## 2012-08-06 MED ORDER — ALENDRONATE SODIUM 70 MG PO TABS: 70.0000 mg | ORAL_TABLET | ORAL | Status: DC

## 2012-08-06 NOTE — Progress Notes (Signed)
Subjective:    Patient ID: Molly [name], female    DOB: [birth year], 42 y.o.   MRN: [id number]  HPI Pt says 5 days ago, she accidentally struck her left foot on door frame.  Since then, she has moderate pain there, but no assoc numbness.  Since then sxs have improved but not resolved.  Past Medical History  Diagnosis Date  . SMOKER 08/11/2009  . CONTUSION, ANKLE 08/28/2007  . SEBACEOUS CYST, INFECTED 07/13/2007  . TINEA VERSICOLOR 07/13/2007  . ALLERGIC RHINITIS CAUSE UNSPECIFIED 03/06/2007  . MIGRAINE, CHRONIC 03/06/2007  . DIABETES MELLITUS, TYPE I 08/03/2006  . DEPRESSION 08/03/2006  . THROMBOCYTOPENIA 05/07/2008  . Encounter for long-term (current) use of other medications   . DM retinopathy   . Bipolar 1 disorder     Past Surgical History  Procedure Laterality Date  . Abnormal u/s  12/10/1996  . Electrocardiogram  06/13/2006  . Tubal ligation      History   Social History  . Marital Status: Widowed    Spouse Name: N/A    Number of Children: N/A  . Years of Education: N/A   Occupational History  . Not on file.   Social History Main Topics  . Smoking status: Former Smoker    Quit date: 10/16/2010  . Smokeless tobacco: Never Used  . Alcohol Use: No  . Drug Use: No  . Sexually Active: Yes     Comment: tubal ligation   Other Topics Concern  . Not on file   Social History Narrative   Does not work outside the home    Current Outpatient Prescriptions on File Prior to Visit  Medication Sig Dispense Refill  . Alcohol Swabs (ALCOHOL PREP) 70 % PADS 8/day  250.01  800 each  3  . carbamazepine (TEGRETOL XR) 200 MG 12 hr tablet Take 2 by mouth every morning daily       . glucose blood (FREESTYLE LITE) test strip 8/day dx 250.01, and lancets  800 each  3  . Insulin Infusion Pump Supplies (MINIMED INFUSION SET-MMT 398) MISC Inject 1 Device into the skin 3 days. Change every 3 days  30 each  3  . Insulin Infusion Pump Supplies (PARADIGM RESERVOIR ) MISC Inject 1 Device  into the skin 3 days. Change every 3 days  30 each  3  . insulin lispro (HUMALOG) 100 UNIT/ML injection For use in pump, total 70 units/day  70 mL  3  . lamoTRIgine (LAMICTAL) 200 MG tablet Take 200 mg by mouth 2 (two) times daily.       . Lancets (FREESTYLE) lancets Use as instructed dx 250.01  800 each  3  . rosuvastatin (CRESTOR) 40 MG tablet Take 1 tablet (40 mg total) by mouth daily. 1 tablet at bedtime  90 tablet  3  . Transparent Dressings (OPSITE IV 3000) MISC Inject 1 Device into the skin 3 days.  30 each  3  . Transparent Dressings (OPSITE IV 3000) MISC Inject 1 Device into the skin 3 days. Change every 3 days  30 each  3  . ziprasidone (GEODON) 60 MG capsule Take 2 by mouth at bedtime        No current facility-administered medications on file prior to visit.    Allergies  Allergen Reactions  . Varenicline Tartrate     REACTION: rash    Family History  Problem Relation Age of Onset  . Diabetes Father     (Oral Agents)  . Diabetes Paternal Grandmother   .  Hypertension Paternal Grandmother   . Diabetes Paternal Grandfather   . Cancer Paternal Grandfather     colon  . Hypertension Paternal Grandfather     BP 126/70  Pulse 64  Ht 5\' 7"  (1.702 m)  Wt 169 lb (76.658 kg)  BMI 26.46 kg/m2  SpO2 98%  LMP 08/06/2012  Review of Systems Denies LOC and bleeding from her foot.    Objective:   Physical Exam VITAL SIGNS:  See vs page GENERAL: no distress Left foot: slight swelling and ecchymosis adjacent to toes 3-5.  (i reviewed 2012 [facility] report)    Assessment & Plan:  Foot contusion, new Osteoporosis, never on rx.

## 2012-08-06 NOTE — Patient Instructions (Addendum)
Let's check an x-ray.  We'll contact you with results.  I hope you feel better soon.  If you don't feel better by next week, please call back.  Please call sooner if you get worse.  i have sent a prescription to your pharmacy, for "fosamax."

## 2012-08-09 ENCOUNTER — Other Ambulatory Visit: Payer: Self-pay

## 2012-08-09 MED ORDER — GLUCOSE BLOOD VI STRP: ORAL_STRIP | Status: DC

## 2012-08-10 ENCOUNTER — Other Ambulatory Visit: Payer: Self-pay | Admitting: *Deleted

## 2012-08-10 MED ORDER — FREESTYLE LANCETS MISC: Status: AC

## 2012-10-02 ENCOUNTER — Ambulatory Visit (INDEPENDENT_AMBULATORY_CARE_PROVIDER_SITE_OTHER): Payer: BC Managed Care – PPO | Admitting: Endocrinology

## 2012-10-02 ENCOUNTER — Ambulatory Visit: Payer: BC Managed Care – PPO | Admitting: Endocrinology

## 2012-10-02 ENCOUNTER — Encounter: Payer: Self-pay | Admitting: Endocrinology

## 2012-10-02 VITALS — BP 124/70 | HR 78 | Ht 67.0 in | Wt 172.0 lb

## 2012-10-02 DIAGNOSIS — M889 Osteitis deformans of unspecified bone: Secondary | ICD-10-CM

## 2012-10-02 DIAGNOSIS — Z79899 Other long term (current) drug therapy: Secondary | ICD-10-CM

## 2012-10-02 DIAGNOSIS — R9431 Abnormal electrocardiogram [ECG] [EKG]: Secondary | ICD-10-CM | POA: Insufficient documentation

## 2012-10-02 DIAGNOSIS — D696 Thrombocytopenia, unspecified: Secondary | ICD-10-CM

## 2012-10-02 DIAGNOSIS — E109 Type 1 diabetes mellitus without complications: Secondary | ICD-10-CM

## 2012-10-02 DIAGNOSIS — Z0000 Encounter for general adult medical examination without abnormal findings: Secondary | ICD-10-CM

## 2012-10-02 DIAGNOSIS — E7800 Pure hypercholesterolemia, unspecified: Secondary | ICD-10-CM

## 2012-10-02 DIAGNOSIS — M810 Age-related osteoporosis without current pathological fracture: Secondary | ICD-10-CM | POA: Insufficient documentation

## 2012-10-02 LAB — LIPID PANEL
Cholesterol: 179 mg/dL (ref 0–200)
HDL: 89.8 mg/dL (ref >39.00)
Total CHOL/HDL Ratio: 2
Triglycerides: 48 mg/dL (ref 0.0–149.0)

## 2012-10-02 LAB — TSH: TSH: 1.85 u[IU]/mL (ref 0.35–5.50)

## 2012-10-02 LAB — CBC WITH DIFFERENTIAL/PLATELET
Basophils Relative: 1 % (ref 0.0–3.0)
Eosinophils Relative: 2 % (ref 0.0–5.0)
HCT: 37.2 % (ref 36.0–46.0)
MCV: 93.5 fl (ref 78.0–100.0)
Monocytes Absolute: 0.4 10*3/uL (ref 0.1–1.0)
Monocytes Relative: 11.2 % (ref 3.0–12.0)
Neutrophils Relative %: 66.3 % (ref 43.0–77.0)
RBC: 3.98 Mil/uL (ref 3.87–5.11)
WBC: 3.3 10*3/uL — ABNORMAL LOW (ref 4.5–10.5)

## 2012-10-02 LAB — BASIC METABOLIC PANEL
CO2: 30 mEq/L (ref 19–32)
Calcium: 9.1 mg/dL (ref 8.4–10.5)
Creatinine, Ser: 0.7 mg/dL (ref 0.4–1.2)
GFR: 103.89 mL/min (ref >60.00)
Sodium: 132 mEq/L — ABNORMAL LOW (ref 135–145)

## 2012-10-02 LAB — URINALYSIS, ROUTINE W REFLEX MICROSCOPIC
Nitrite: NEGATIVE
Total Protein, Urine: NEGATIVE
Urine Glucose: >=1000
pH: 7.5 (ref 5.0–8.0)

## 2012-10-02 LAB — MICROALBUMIN / CREATININE URINE RATIO
Microalb Creat Ratio: 0.3 mg/g (ref 0.0–30.0)
Microalb, Ur: 0.2 mg/dL (ref 0.0–1.9)

## 2012-10-02 LAB — HEPATIC FUNCTION PANEL
ALT: 17 U/L (ref 0–35)
Albumin: 4.1 g/dL (ref 3.5–5.2)
Total Bilirubin: 0.5 mg/dL (ref 0.3–1.2)

## 2012-10-02 NOTE — Patient Instructions (Addendum)
Let's check an echocardiogram.  you will receive a phone call, about a day and time for an appointment. please consider these measures for your health:  minimize alcohol.  do not use tobacco products.  Women should have an annual mammogram from age 43.  keep firearms safely stored.  always use seat belts.  have working smoke alarms in your home.  see an eye doctor and dentist regularly.  never drive under the influence of alcohol or drugs (including prescription drugs).  those with fair skin should take precautions against the sun. Labs to dr Evelene Croon.
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
## 2012-10-03 ENCOUNTER — Other Ambulatory Visit (HOSPITAL_COMMUNITY): Payer: Self-pay | Admitting: Endocrinology

## 2012-10-03 ENCOUNTER — Telehealth: Payer: Self-pay

## 2012-10-03 DIAGNOSIS — R9431 Abnormal electrocardiogram [ECG] [EKG]: Secondary | ICD-10-CM

## 2012-10-03 NOTE — Telephone Encounter (Signed)
Pt advised of phone #

## 2012-10-03 NOTE — Telephone Encounter (Signed)
i requested echo to be done at [facility].  If you have another preference, please pass the request on to [facility]

## 2012-10-03 NOTE — Telephone Encounter (Signed)
Pt left voicemail statiing she needs the name and phone for the cardiologist you referred her to yesterday [phone]

## 2012-10-16 ENCOUNTER — Ambulatory Visit (HOSPITAL_COMMUNITY): Payer: BC Managed Care – PPO | Attending: Cardiology

## 2012-10-16 DIAGNOSIS — I079 Rheumatic tricuspid valve disease, unspecified: Secondary | ICD-10-CM | POA: Insufficient documentation

## 2012-10-16 DIAGNOSIS — Z87891 Personal history of nicotine dependence: Secondary | ICD-10-CM | POA: Insufficient documentation

## 2012-10-16 DIAGNOSIS — E785 Hyperlipidemia, unspecified: Secondary | ICD-10-CM | POA: Insufficient documentation

## 2012-10-16 DIAGNOSIS — E119 Type 2 diabetes mellitus without complications: Secondary | ICD-10-CM | POA: Insufficient documentation

## 2012-10-16 DIAGNOSIS — I059 Rheumatic mitral valve disease, unspecified: Secondary | ICD-10-CM | POA: Insufficient documentation

## 2012-10-16 DIAGNOSIS — R9431 Abnormal electrocardiogram [ECG] [EKG]: Secondary | ICD-10-CM | POA: Insufficient documentation

## 2012-10-16 NOTE — Progress Notes (Signed)
Echocardiogram performed.  

## 2012-11-09 ENCOUNTER — Ambulatory Visit (INDEPENDENT_AMBULATORY_CARE_PROVIDER_SITE_OTHER): Payer: BC Managed Care – PPO

## 2012-11-09 ENCOUNTER — Encounter: Payer: Self-pay | Admitting: Endocrinology

## 2012-11-09 ENCOUNTER — Ambulatory Visit (INDEPENDENT_AMBULATORY_CARE_PROVIDER_SITE_OTHER): Payer: BC Managed Care – PPO | Admitting: Endocrinology

## 2012-11-09 VITALS — BP 122/80 | HR 76 | Ht 67.0 in | Wt 170.0 lb

## 2012-11-09 DIAGNOSIS — Z0289 Encounter for other administrative examinations: Secondary | ICD-10-CM

## 2012-11-09 DIAGNOSIS — Z23 Encounter for immunization: Secondary | ICD-10-CM

## 2012-11-09 NOTE — Patient Instructions (Signed)
Please go to the old office now, and get MMR and Hepatitis-B shots.  Also, they will place a "PPD" skin test Please make a nurse appointment here on Monday, to get it read.

## 2012-11-09 NOTE — Progress Notes (Signed)
Subjective:    Patient ID: Molly [name], female    DOB: 20-Nov-1970, 42 y.o.   MRN: [id number]  HPI Pt is here to complete occupational form.   She will work at a [employer].  pt states she feels well in general.   Past Medical History  Diagnosis Date  . SMOKER 08/11/2009  . CONTUSION, ANKLE 08/28/2007  . SEBACEOUS CYST, INFECTED 07/13/2007  . TINEA VERSICOLOR 07/13/2007  . ALLERGIC RHINITIS CAUSE UNSPECIFIED 03/06/2007  . MIGRAINE, CHRONIC 03/06/2007  . DIABETES MELLITUS, TYPE I 08/03/2006  . DEPRESSION 08/03/2006  . THROMBOCYTOPENIA 05/07/2008  . Encounter for long-term (current) use of other medications   . DM retinopathy   . Bipolar 1 disorder     Past Surgical History  Procedure Laterality Date  . Abnormal u/s  12/10/1996  . Electrocardiogram  06/13/2006  . Tubal ligation      History   Social History  . Marital Status: Widowed    Spouse Name: N/A    Number of Children: N/A  . Years of Education: N/A   Occupational History  . Not on file.   Social History Main Topics  . Smoking status: Former Smoker    Quit date: 10/16/2010  . Smokeless tobacco: Never Used  . Alcohol Use: No  . Drug Use: No  . Sexual Activity: Yes     Comment: tubal ligation   Other Topics Concern  . Not on file   Social History Narrative   Does not work outside the home    Current Outpatient Prescriptions on File Prior to Visit  Medication Sig Dispense Refill  . Alcohol Swabs (ALCOHOL PREP) 70 % PADS 8/day  250.01  800 each  3  . alendronate (FOSAMAX) 70 MG tablet Take 1 tablet (70 mg total) by mouth every 7 (seven) days. Take with a full glass of water on an empty stomach.  4 tablet  11  . carbamazepine (TEGRETOL XR) 200 MG 12 hr tablet Take 2 by mouth every morning daily       . glucose blood (FREESTYLE LITE) test strip 8/day dx 250.01, and lancets  800 each  3  . Insulin Infusion Pump Supplies (MINIMED INFUSION SET-MMT 398) MISC Inject 1 Device into the skin 3 days. Change every 3  days  30 each  3  . Insulin Infusion Pump Supplies (PARADIGM RESERVOIR ) MISC Inject 1 Device into the skin 3 days. Change every 3 days  30 each  3  . insulin lispro (HUMALOG) 100 UNIT/ML injection For use in pump, total 70 units/day  70 mL  3  . lamoTRIgine (LAMICTAL) 200 MG tablet Take 200 mg by mouth 2 (two) times daily.       . Lancets (FREESTYLE) lancets Use as instructed dx 250.01  800 each  3  . rosuvastatin (CRESTOR) 40 MG tablet Take 1 tablet (40 mg total) by mouth daily. 1 tablet at bedtime  90 tablet  3  . Transparent Dressings (OPSITE IV 3000) MISC Inject 1 Device into the skin 3 days.  30 each  3  . Transparent Dressings (OPSITE IV 3000) MISC Inject 1 Device into the skin 3 days. Change every 3 days  30 each  3  . ziprasidone (GEODON) 60 MG capsule Take 2 by mouth at bedtime        No current facility-administered medications on file prior to visit.   Allergies  Allergen Reactions  . Varenicline Tartrate     REACTION: rash   Family History  Problem Relation Age of Onset  . Diabetes Father     (Oral Agents)  . Diabetes Paternal Grandmother   . Hypertension Paternal Grandmother   . Diabetes Paternal Grandfather   . Cancer Paternal Grandfather     colon  . Hypertension Paternal Grandfather    BP 122/80  Pulse 76  Ht 5\' 7"  (1.702 m)  Wt 170 lb (77.111 kg)  BMI 26.62 kg/m2  SpO2 97%  Review of Systems Denies resp sxs.    Objective:   Physical Exam VITAL SIGNS:  See vs page GENERAL: no distress        Assessment & Plan:  Occupational visit.  Please see form for details

## 2012-11-10 DIAGNOSIS — Z0289 Encounter for other administrative examinations: Secondary | ICD-10-CM | POA: Insufficient documentation

## 2012-11-12 ENCOUNTER — Ambulatory Visit (INDEPENDENT_AMBULATORY_CARE_PROVIDER_SITE_OTHER): Payer: BC Managed Care – PPO | Admitting: Endocrinology

## 2012-11-12 DIAGNOSIS — Z111 Encounter for screening for respiratory tuberculosis: Secondary | ICD-10-CM

## 2012-11-12 LAB — TB SKIN TEST: TB Skin Test: NEGATIVE

## 2012-11-16 NOTE — Progress Notes (Signed)
Encounter was used to document a PPD reading only

## 2013-01-02 ENCOUNTER — Ambulatory Visit (INDEPENDENT_AMBULATORY_CARE_PROVIDER_SITE_OTHER): Payer: BC Managed Care – PPO | Admitting: Endocrinology

## 2013-01-02 ENCOUNTER — Encounter: Payer: Self-pay | Admitting: Endocrinology

## 2013-01-02 VITALS — BP 118/60 | HR 66 | Temp 98.0°F | Ht 67.0 in | Wt 175.0 lb

## 2013-01-02 DIAGNOSIS — M810 Age-related osteoporosis without current pathological fracture: Secondary | ICD-10-CM

## 2013-01-02 DIAGNOSIS — E109 Type 1 diabetes mellitus without complications: Secondary | ICD-10-CM

## 2013-01-02 LAB — HEMOGLOBIN A1C: Hgb A1c MFr Bld: 7.5 % — ABNORMAL HIGH (ref 4.6–6.5)

## 2013-01-02 MED ORDER — INSULIN ASPART 100 UNIT/ML ~~LOC~~ SOLN: SUBCUTANEOUS | Status: DC

## 2013-01-02 NOTE — Patient Instructions (Addendum)
check your blood sugar 8 times a day.  vary the time of day when you check, between before the 3 meals, and at bedtime.  also check if you have symptoms of your blood sugar being too high or too low.  please keep a record of the readings and bring it to your next appointment here.  please call us sooner if your blood sugar goes below 70, or if it stays over 200.  Please continue a basal rate to 0.9 units/hr, 24 hrs per day.  continue mealtime bolus of 1 unit/ 13 grams carbohydrate.  however, subtract 1 unit from calculated lunch bolus.  Add 3 units to calculated breakfast bolus.   continue correction bolus (which some people call "sensitivity," or "insulin sensitivity ratio," or just "isr") of 1 unit for each 50 by which your glucose exceeds 100. Please come back for a follow-up appointment in 3 months.   A diabetes blood test is requested for you today.  We'll contact you with results.

## 2013-01-02 NOTE — Progress Notes (Signed)
Subjective:    Patient ID: Molly [name], female    DOB: 03-24-1970, 42 y.o.   MRN: [id number]  HPI Pt returns for f/u of type 1 dm (dx'ed 1983, when she presented with DKA; she has been on insulin since dx; she has mild if any neuropathy of the lower extremities; no known associated chronic complications; her last episode of severe hypoglycemia was many years ago; last episode of DKA was in 2011 (due to pump malfunction); she has never had severe hypoglycemia; she has a paradigm insulin pump; she declines continuous glucose monitor, due to cost).  pt states she feels well in general.  she brings a record of her cbg's which i have reviewed today.  It varies from 60-200, but most are in the low-100's.  There is no trend throughout the day.   Past Medical History  Diagnosis Date  . SMOKER 08/11/2009  . CONTUSION, ANKLE 08/28/2007  . SEBACEOUS CYST, INFECTED 07/13/2007  . TINEA VERSICOLOR 07/13/2007  . ALLERGIC RHINITIS CAUSE UNSPECIFIED 03/06/2007  . MIGRAINE, CHRONIC 03/06/2007  . DIABETES MELLITUS, TYPE I 08/03/2006  . DEPRESSION 08/03/2006  . THROMBOCYTOPENIA 05/07/2008  . Encounter for long-term (current) use of other medications   . DM retinopathy   . Bipolar 1 disorder     Past Surgical History  Procedure Laterality Date  . Abnormal u/s  12/10/1996  . Electrocardiogram  06/13/2006  . Tubal ligation      History   Social History  . Marital Status: Widowed    Spouse Name: N/A    Number of Children: N/A  . Years of Education: N/A   Occupational History  . Not on file.   Social History Main Topics  . Smoking status: Former Smoker    Quit date: 10/16/2010  . Smokeless tobacco: Never Used  . Alcohol Use: No  . Drug Use: No  . Sexual Activity: Yes     Comment: tubal ligation   Other Topics Concern  . Not on file   Social History Narrative   Does not work outside the home    Current Outpatient Prescriptions on File Prior to Visit  Medication Sig Dispense Refill  .  Alcohol Swabs (ALCOHOL PREP) 70 % PADS 8/day  250.01  800 each  3  . alendronate (FOSAMAX) 70 MG tablet Take 1 tablet (70 mg total) by mouth every 7 (seven) days. Take with a full glass of water on an empty stomach.  4 tablet  11  . carbamazepine (TEGRETOL XR) 200 MG 12 hr tablet Take 2 by mouth every morning daily       . glucose blood (FREESTYLE LITE) test strip 8/day dx 250.01, and lancets  800 each  3  . Insulin Infusion Pump Supplies (MINIMED INFUSION SET-MMT 398) MISC Inject 1 Device into the skin 3 days. Change every 3 days  30 each  3  . Insulin Infusion Pump Supplies (PARADIGM RESERVOIR ) MISC Inject 1 Device into the skin 3 days. Change every 3 days  30 each  3  . lamoTRIgine (LAMICTAL) 200 MG tablet Take 200 mg by mouth 2 (two) times daily.       . Lancets (FREESTYLE) lancets Use as instructed dx 250.01  800 each  3  . rosuvastatin (CRESTOR) 40 MG tablet Take 1 tablet (40 mg total) by mouth daily. 1 tablet at bedtime  90 tablet  3  . Transparent Dressings (OPSITE IV 3000) MISC Inject 1 Device into the skin 3 days.  30 each  3  . Transparent Dressings (OPSITE IV 3000) MISC Inject 1 Device into the skin 3 days. Change every 3 days  30 each  3  . ziprasidone (GEODON) 60 MG capsule Take 2 by mouth at bedtime        No current facility-administered medications on file prior to visit.   Allergies  Allergen Reactions  . Varenicline Tartrate     REACTION: rash   Family History  Problem Relation Age of Onset  . Diabetes Father     (Oral Agents)  . Diabetes Paternal Grandmother   . Hypertension Paternal Grandmother   . Diabetes Paternal Grandfather   . Cancer Paternal Grandfather     colon  . Hypertension Paternal Grandfather    BP 118/60  Pulse 66  Temp(Src) 98 F (36.7 C) (Oral)  Ht 5\' 7"  (1.702 m)  Wt 175 lb (79.379 kg)  BMI 27.40 kg/m2  SpO2 99%  Review of Systems Denies LOC and weight change    Objective:   Physical Exam VITAL SIGNS:  See vs page GENERAL: no  distress SKIN:  Insulin infusion sites at the anterior abdomen are normal.    Lab Results  Component Value Date   HGBA1C 7.5* 01/02/2013      Assessment & Plan:  DM: This insulin pump regimen was chosen from multiple options, as it best matches his insulin to his changing requirements throughout the day.  The benefits of glycemic control must be weighed against the risks of hypoglycemia.  Due to variable cbg's, we can't increase insulin now.   Depression: this can complicate the rx of DM, but it seems better controlled now.

## 2013-01-16 ENCOUNTER — Ambulatory Visit (INDEPENDENT_AMBULATORY_CARE_PROVIDER_SITE_OTHER): Payer: BC Managed Care – PPO | Admitting: Family Medicine

## 2013-01-16 VITALS — BP 142/70 | HR 82 | Temp 98.4°F | Resp 16 | Ht 66.25 in | Wt 166.2 lb

## 2013-01-16 DIAGNOSIS — J329 Chronic sinusitis, unspecified: Secondary | ICD-10-CM

## 2013-01-16 DIAGNOSIS — J209 Acute bronchitis, unspecified: Secondary | ICD-10-CM

## 2013-01-16 DIAGNOSIS — J328 Other chronic sinusitis: Secondary | ICD-10-CM

## 2013-01-16 MED ORDER — LEVOFLOXACIN 500 MG PO TABS: 500.0000 mg | ORAL_TABLET | Freq: Every day | ORAL | Status: DC

## 2013-01-16 NOTE — Patient Instructions (Signed)
Bronchitis Bronchitis is the body's way of reacting to injury and/or infection (inflammation) of the bronchi. Bronchi are the air tubes that extend from the windpipe into the lungs. If the inflammation becomes severe, it may cause shortness of breath. CAUSES  Inflammation may be caused by:  A virus.  Germs (bacteria).  Dust.  Allergens.  Pollutants and many other irritants. The cells lining the bronchial tree are covered with tiny hairs (cilia). These constantly beat upward, away from the lungs, toward the mouth. This keeps the lungs free of pollutants. When these cells become too irritated and are unable to do their job, mucus begins to develop. This causes the characteristic cough of bronchitis. The cough clears the lungs when the cilia are unable to do their job. Without either of these protective mechanisms, the mucus would settle in the lungs. Then you would develop pneumonia. Smoking is a common cause of bronchitis and can contribute to pneumonia. Stopping this habit is the single most important thing you can do to help yourself. TREATMENT   Your caregiver may prescribe an antibiotic if the cough is caused by bacteria. Also, medicines that open up your airways make it easier to breathe. Your caregiver may also recommend or prescribe an expectorant. It will loosen the mucus to be coughed up. Only take over-the-counter or prescription medicines for pain, discomfort, or fever as directed by your caregiver.  Removing whatever causes the problem (smoking, for example) is critical to preventing the problem from getting worse.  Cough suppressants may be prescribed for relief of cough symptoms.  Inhaled medicines may be prescribed to help with symptoms now and to help prevent problems from returning.  For those with recurrent (chronic) bronchitis, there may be a need for steroid medicines. SEEK IMMEDIATE MEDICAL CARE IF:   During treatment, you develop more pus-like mucus (purulent  sputum).  You have a fever.  You become progressively more ill.  You have increased difficulty breathing, wheezing, or shortness of breath. It is necessary to seek immediate medical care if you are elderly or sick from any other disease. MAKE SURE YOU:   Understand these instructions.  Will watch your condition.  Will get help right away if you are not doing well or get worse. Document Released: 01/10/2005 Document Revised: 09/12/2012 Document Reviewed: 09/04/2012 [facility]ExitCare Patient Information 2014 [place], [place]. Sinusitis Sinusitis is redness, soreness, and swelling (inflammation) of the paranasal sinuses. Paranasal sinuses are air pockets within the bones of your face (beneath the eyes, the middle of the forehead, or above the eyes). In healthy paranasal sinuses, mucus is able to drain out, and air is able to circulate through them by way of your nose. However, when your paranasal sinuses are inflamed, mucus and air can become trapped. This can allow bacteria and other germs to grow and cause infection. Sinusitis can develop quickly and last only a short time (acute) or continue over a long period (chronic). Sinusitis that lasts for more than 12 weeks is considered chronic.  CAUSES  Causes of sinusitis include:  Allergies.  Structural abnormalities, such as displacement of the cartilage that separates your nostrils (deviated septum), which can decrease the air flow through your nose and sinuses and affect sinus drainage.  Functional abnormalities, such as when the small hairs (cilia) that line your sinuses and help remove mucus do not work properly or are not present. SYMPTOMS  Symptoms of acute and chronic sinusitis are the same. The primary symptoms are pain and pressure around the affected sinuses. Other  symptoms include:  Upper toothache.  Earache.  Headache.  Bad breath.  Decreased sense of smell and taste.  A cough, which worsens when you are lying  flat.  Fatigue.  Fever.  Thick drainage from your nose, which often is green and may contain pus (purulent).  Swelling and warmth over the affected sinuses. DIAGNOSIS  Your caregiver will perform a physical exam. During the exam, your caregiver may:  Look in your nose for signs of abnormal growths in your nostrils (nasal polyps).  Tap over the affected sinus to check for signs of infection.  View the inside of your sinuses (endoscopy) with a special imaging device with a light attached (endoscope), which is inserted into your sinuses. If your caregiver suspects that you have chronic sinusitis, one or more of the following tests may be recommended:  Allergy tests.  Nasal culture A sample of mucus is taken from your nose and sent to a lab and screened for bacteria.  Nasal cytology A sample of mucus is taken from your nose and examined by your caregiver to determine if your sinusitis is related to an allergy. TREATMENT  Most cases of acute sinusitis are related to a viral infection and will resolve on their own within 10 days. Sometimes medicines are prescribed to help relieve symptoms (pain medicine, decongestants, nasal steroid sprays, or saline sprays).  However, for sinusitis related to a bacterial infection, your caregiver will prescribe antibiotic medicines. These are medicines that will help kill the bacteria causing the infection.  Rarely, sinusitis is caused by a fungal infection. In theses cases, your caregiver will prescribe antifungal medicine. For some cases of chronic sinusitis, surgery is needed. Generally, these are cases in which sinusitis recurs more than 3 times per year, despite other treatments. HOME CARE INSTRUCTIONS   Drink plenty of water. Water helps thin the mucus so your sinuses can drain more easily.  Use a humidifier.  Inhale steam 3 to 4 times a day (for example, sit in the bathroom with the shower running).  Apply a warm, moist washcloth to your face 3  to 4 times a day, or as directed by your caregiver.  Use saline nasal sprays to help moisten and clean your sinuses.  Take over-the-counter or prescription medicines for pain, discomfort, or fever only as directed by your caregiver. SEEK IMMEDIATE MEDICAL CARE IF:  You have increasing pain or severe headaches.  You have nausea, vomiting, or drowsiness.  You have swelling around your face.  You have vision problems.  You have a stiff neck.  You have difficulty breathing. MAKE SURE YOU:   Understand these instructions.  Will watch your condition.  Will get help right away if you are not doing well or get worse. Document Released: 01/10/2005 Document Revised: 04/04/2011 Document Reviewed: 01/25/2011 [facility]ExitCare Patient Information 2014 [place], [place].

## 2013-01-16 NOTE — Progress Notes (Signed)
°  This chart was scribed for Elvina Sidle, MD by Joaquin Music, ED Scribe. This patient was seen in room Room/bed 2 and the patient's care was started at 10:49 AM. Subjective:    Patient ID: Molly [name], female    DOB: [birth year], 42 y.o.   MRN: [id number] Chief Complaint  Patient presents with   Sinus Congestion    X Saturday   Cough    X Saturday   HPI Molly [name] is a 42 y.o. female with hx of DM who presents to the [facility] complaining of ongoing sinus congestin, cough, and fatigue with associated rhinorrhea that began 3 days ago. Pt states she was seen at different medical facility 2 days ago. She states she was given a steroid shot and states she has not had any relief. Pt denies fever and nausea.  Pt states her glucose levels are running high. She reports having a glucose level of 264. She states her A1C levels generally are controlled. Pt states she would go see her PCP, Dr. Romero Belling, is on vacations until January 28, 2013.  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  Review of Systems  Constitutional: Negative for fever.  HENT: Positive for congestion and rhinorrhea.   Respiratory: Positive for cough.   Gastrointestinal: Negative for nausea.       Objective:   Physical Exam  Nursing note and vitals reviewed. Constitutional: She is oriented to person, place, and time. She appears well-developed and well-nourished. No distress.  HENT:  Head: Normocephalic and atraumatic.  Right Ear: External ear normal.  Left Ear: External ear normal.  Eyes: Conjunctivae are normal. Pupils are equal, round, and reactive to light.  Neck: Normal range of motion. Neck supple. No thyromegaly present.  Cardiovascular: Normal rate and regular rhythm.   No murmur heard. Pulmonary/Chest: Effort normal and breath sounds normal. She has no wheezes.  Abdominal: Bowel sounds are normal. She exhibits no mass. There is no rebound and no guarding.    Musculoskeletal: Normal range of motion. She exhibits no tenderness.  Neurological: She is alert and oriented to person, place, and time. She has normal reflexes.  Skin: Skin is warm and dry. She is not diaphoretic.  Psychiatric: She has a normal mood and affect. Her behavior is normal.    BP 142/70   Pulse 82   Temp(Src) 98.4 F (36.9 C) (Oral)   Resp 16   Ht 5' 6.25" (1.683 m)   Wt 166 lb 3.2 oz (75.388 kg)   BMI 26.62 kg/m2   SpO2 98%   LMP 01/02/2013  Assessment & Plan:   1. Acute bronchitis   2. Sinusitis     Acute bronchitis - Plan: levofloxacin (LEVAQUIN) 500 MG tablet  Sinusitis - Plan: levofloxacin (LEVAQUIN) 500 MG tablet  Signed, Elvina Sidle, MD

## 2013-03-01 ENCOUNTER — Encounter: Payer: Self-pay | Admitting: Endocrinology

## 2013-03-01 ENCOUNTER — Ambulatory Visit (INDEPENDENT_AMBULATORY_CARE_PROVIDER_SITE_OTHER): Payer: BC Managed Care – PPO | Admitting: Endocrinology

## 2013-03-01 VITALS — BP 132/64 | HR 71 | Temp 98.8°F | Ht 66.25 in | Wt 174.0 lb

## 2013-03-01 DIAGNOSIS — J069 Acute upper respiratory infection, unspecified: Secondary | ICD-10-CM

## 2013-03-01 MED ORDER — CEFUROXIME AXETIL 250 MG PO TABS: 250.0000 mg | ORAL_TABLET | Freq: Two times a day (BID) | ORAL | Status: AC

## 2013-03-01 NOTE — Patient Instructions (Addendum)
i have sent a prescription to your pharmacy, for an antibiotic pill. Loratadine-d (non-prescription) will help your congestion. I hope you feel better soon.  If you don't feel better in 1-2 weeks, please call back.  Please call sooner if you get worse.

## 2013-03-01 NOTE — Progress Notes (Signed)
Subjective:    Patient ID: Molly [name], female    DOB: 12-28-1970, 43 y.o.   MRN: [id number]  HPI Pt states 3 days of moderate congestion in the nose, and assoc cough. Past Medical History  Diagnosis Date  . SMOKER 08/11/2009  . CONTUSION, ANKLE 08/28/2007  . SEBACEOUS CYST, INFECTED 07/13/2007  . TINEA VERSICOLOR 07/13/2007  . ALLERGIC RHINITIS CAUSE UNSPECIFIED 03/06/2007  . MIGRAINE, CHRONIC 03/06/2007  . DIABETES MELLITUS, TYPE I 08/03/2006  . DEPRESSION 08/03/2006  . THROMBOCYTOPENIA 05/07/2008  . Encounter for long-term (current) use of other medications   . DM retinopathy   . Bipolar 1 disorder     Past Surgical History  Procedure Laterality Date  . Abnormal u/s  12/10/1996  . Electrocardiogram  06/13/2006  . Tubal ligation      History   Social History  . Marital Status: Widowed    Spouse Name: N/A    Number of Children: N/A  . Years of Education: N/A   Occupational History  . Not on file.   Social History Main Topics  . Smoking status: Former Smoker    Quit date: 10/16/2010  . Smokeless tobacco: Never Used  . Alcohol Use: No  . Drug Use: No  . Sexual Activity: Yes     Comment: tubal ligation   Other Topics Concern  . Not on file   Social History Narrative   Does not work outside the home    Current Outpatient Prescriptions on File Prior to Visit  Medication Sig Dispense Refill  . Alcohol Swabs (ALCOHOL PREP) 70 % PADS 8/day  250.01  800 each  3  . alendronate (FOSAMAX) 70 MG tablet Take 1 tablet (70 mg total) by mouth every 7 (seven) days. Take with a full glass of water on an empty stomach.  4 tablet  11  . carbamazepine (TEGRETOL XR) 200 MG 12 hr tablet Take 2 by mouth every morning daily       . fluticasone (FLONASE) 50 MCG/ACT nasal spray Place into both nostrils daily.      Marland Kitchen. glucose blood (FREESTYLE LITE) test strip 8/day dx 250.01, and lancets  800 each  3  . insulin aspart (NOVOLOG) 100 UNIT/ML injection For use in pump, total 70 units/day  7  vial  12  . Insulin Infusion Pump Supplies (MINIMED INFUSION SET-MMT 398) MISC Inject 1 Device into the skin 3 days. Change every 3 days  30 each  3  . Insulin Infusion Pump Supplies (PARADIGM RESERVOIR 3ML) MISC Inject 1 Device into the skin 3 days. Change every 3 days  30 each  3  . lamoTRIgine (LAMICTAL) 200 MG tablet Take 200 mg by mouth 2 (two) times daily.       . Lancets (FREESTYLE) lancets Use as instructed dx 250.01  800 each  3  . rosuvastatin (CRESTOR) 40 MG tablet Take 1 tablet (40 mg total) by mouth daily. 1 tablet at bedtime  90 tablet  3  . Transparent Dressings (OPSITE IV 3000) MISC Inject 1 Device into the skin 3 days.  30 each  3  . Transparent Dressings (OPSITE IV 3000) MISC Inject 1 Device into the skin 3 days. Change every 3 days  30 each  3  . ziprasidone (GEODON) 60 MG capsule Take 2 by mouth at bedtime        No current facility-administered medications on file prior to visit.    Allergies  Allergen Reactions  . Varenicline Tartrate  REACTION: rash    Family History  Problem Relation Age of Onset  . Diabetes Father     (Oral Agents)  . Diabetes Paternal Grandmother   . Hypertension Paternal Grandmother   . Diabetes Paternal Grandfather   . Cancer Paternal Grandfather     colon  . Hypertension Paternal Grandfather     BP 132/64  Pulse 71  Temp(Src) 98.8 F (37.1 C) (Oral)  Ht 5' 6.25" (1.683 m)  Wt 174 lb (78.926 kg)  BMI 27.86 kg/m2  SpO2 98%  Review of Systems Denies fever and earache    Objective:   Physical Exam VITAL SIGNS:  See vs page GENERAL: no distress head: no deformity eyes: no periorbital swelling, no proptosis external nose and ears are normal mouth: no lesion seen Both eac's and tm's are normal LUNGS:  Clear to auscultation.      Assessment & Plan:  URI, new

## 2013-03-27 ENCOUNTER — Telehealth: Payer: Self-pay | Admitting: Endocrinology

## 2013-03-27 ENCOUNTER — Other Ambulatory Visit: Payer: Self-pay

## 2013-03-27 MED ORDER — ROSUVASTATIN CALCIUM 40 MG PO TABS: 40.0000 mg | ORAL_TABLET | Freq: Every day | ORAL | Status: DC

## 2013-03-27 NOTE — Telephone Encounter (Signed)
Needs RX 1 yr supply for Crestor to be filled every 3 months. Patient would like a physical script. The last one that was called in is incorrect. Please send it to her if possible.

## 2013-03-27 NOTE — Telephone Encounter (Signed)
Done

## 2013-03-27 NOTE — Telephone Encounter (Signed)
Pt states she needs a refill of her Crestor  Ok to leave message on her voicemail as she will not be able to answer phone till 3:15 pm   Call back [phone]  Than You :)

## 2013-03-28 NOTE — Telephone Encounter (Signed)
Called pt and left a message that a script was ready for pick up if she liked. Script has already been faxed to pharmacy.  Script placed upfront.

## 2013-04-10 ENCOUNTER — Ambulatory Visit: Payer: BC Managed Care – PPO | Admitting: Endocrinology

## 2013-04-26 ENCOUNTER — Ambulatory Visit: Payer: BC Managed Care – PPO | Admitting: Endocrinology

## 2013-05-06 ENCOUNTER — Encounter: Payer: Self-pay | Admitting: Endocrinology

## 2013-05-06 ENCOUNTER — Ambulatory Visit (INDEPENDENT_AMBULATORY_CARE_PROVIDER_SITE_OTHER): Payer: BC Managed Care – PPO | Admitting: Endocrinology

## 2013-05-06 VITALS — BP 126/53 | HR 67 | Temp 98.6°F | Ht 66.0 in | Wt 164.0 lb

## 2013-05-06 DIAGNOSIS — E109 Type 1 diabetes mellitus without complications: Secondary | ICD-10-CM

## 2013-05-06 LAB — HEMOGLOBIN A1C: Hgb A1c MFr Bld: 7.4 % — ABNORMAL HIGH (ref 4.6–6.5)

## 2013-05-06 NOTE — Progress Notes (Signed)
Subjective:    Patient ID: Molly [name], female    DOB: 02/09/1970, 43 y.o.   MRN: [id number]  HPI Pt returns for f/u of type 1 dm (dx'ed 1983, when she presented with DKA; she has been on insulin since dx; she has mild if any neuropathy of the lower extremities; no known associated chronic complications; her last episode of severe hypoglycemia was many years ago; last episode of DKA was in 2011 (due to pump malfunction); she has never had severe hypoglycemia; she has a paradigm insulin pump; she declines continuous glucose monitor, due to cost).  pt states she feels well in general.  she brings a record of her cbg's which i have reviewed today.  It varies from 60-200's, but most are in the low-100's.  There is no trend throughout the day.   Past Medical History  Diagnosis Date  . SMOKER 08/11/2009  . CONTUSION, ANKLE 08/28/2007  . SEBACEOUS CYST, INFECTED 07/13/2007  . TINEA VERSICOLOR 07/13/2007  . ALLERGIC RHINITIS CAUSE UNSPECIFIED 03/06/2007  . MIGRAINE, CHRONIC 03/06/2007  . DIABETES MELLITUS, TYPE I 08/03/2006  . DEPRESSION 08/03/2006  . THROMBOCYTOPENIA 05/07/2008  . Encounter for long-term (current) use of other medications   . DM retinopathy   . Bipolar 1 disorder     Past Surgical History  Procedure Laterality Date  . Abnormal u/s  12/10/1996  . Electrocardiogram  06/13/2006  . Tubal ligation      History   Social History  . Marital Status: Widowed    Spouse Name: N/A    Number of Children: N/A  . Years of Education: N/A   Occupational History  . Not on file.   Social History Main Topics  . Smoking status: Former Smoker    Quit date: 10/16/2010  . Smokeless tobacco: Never Used  . Alcohol Use: No  . Drug Use: No  . Sexual Activity: Yes     Comment: tubal ligation   Other Topics Concern  . Not on file   Social History Narrative   Does not work outside the home    Current Outpatient Prescriptions on File Prior to Visit  Medication Sig Dispense Refill  .  Alcohol Swabs (ALCOHOL PREP) 70 % PADS 8/day  250.01  800 each  3  . alendronate (FOSAMAX) 70 MG tablet Take 1 tablet (70 mg total) by mouth every 7 (seven) days. Take with a full glass of water on an empty stomach.  4 tablet  11  . carbamazepine (TEGRETOL XR) 200 MG 12 hr tablet Take 2 by mouth every morning daily       . fluticasone (FLONASE) 50 MCG/ACT nasal spray Place into both nostrils daily.      Marland Kitchen. glucose blood (FREESTYLE LITE) test strip 8/day dx 250.01, and lancets  800 each  3  . insulin aspart (NOVOLOG) 100 UNIT/ML injection For use in pump, total 70 units/day  7 vial  12  . Insulin Infusion Pump Supplies (MINIMED INFUSION SET-MMT 398) MISC Inject 1 Device into the skin 3 days. Change every 3 days  30 each  3  . Insulin Infusion Pump Supplies (PARADIGM RESERVOIR 3ML) MISC Inject 1 Device into the skin 3 days. Change every 3 days  30 each  3  . lamoTRIgine (LAMICTAL) 200 MG tablet Take 200 mg by mouth 2 (two) times daily.       . Lancets (FREESTYLE) lancets Use as instructed dx 250.01  800 each  3  . rosuvastatin (CRESTOR) 40 MG tablet  Take 1 tablet (40 mg total) by mouth daily. 1 tablet at bedtime  90 tablet  3  . Transparent Dressings (OPSITE IV 3000) MISC Inject 1 Device into the skin 3 days.  30 each  3  . Transparent Dressings (OPSITE IV 3000) MISC Inject 1 Device into the skin 3 days. Change every 3 days  30 each  3  . ziprasidone (GEODON) 60 MG capsule Take 2 by mouth at bedtime        No current facility-administered medications on file prior to visit.    Allergies  Allergen Reactions  . Varenicline Tartrate     REACTION: rash    Family History  Problem Relation Age of Onset  . Diabetes Father     (Oral Agents)  . Diabetes Paternal Grandmother   . Hypertension Paternal Grandmother   . Diabetes Paternal Grandfather   . Cancer Paternal Grandfather     colon  . Hypertension Paternal Grandfather     BP 126/53  Pulse 67  Temp(Src) 98.6 F (37 C) (Oral)  Ht 5\' 6"   (1.676 m)  Wt 164 lb (74.39 kg)  BMI 26.48 kg/m2  SpO2 97%  Review of Systems Denies LOC.  She has lost a few lbs.  Pt says depression persists, due to her husband's death in 2013, and dtr's illness.     Objective:   Physical Exam VITAL SIGNS:  See vs page GENERAL: no distress  Lab Results  Component Value Date   HGBA1C 7.4* 05/06/2013       Assessment & Plan:  DM: This insulin pump regimen was chosen from multiple options, as it best matches his insulin to his changing requirements throughout the day.  The benefits of glycemic control must be weighed against the risks of hypoglycemia.  Due to variable cbg's, we can't increase insulin now.  However, as the hypoglycemia seems related to exercise, we'll first try to reduce these episodes by having her suspend the pump then.   Depression: this can complicate the rx of DM, but it seems better controlled now.

## 2013-05-06 NOTE — Patient Instructions (Signed)
check your blood sugar 8 times a day.  vary the time of day when you check, between before the 3 meals, and at bedtime.  also check if you have symptoms of your blood sugar being too high or too low.  please keep a record of the readings and bring it to your next appointment here.  please call us sooner if your blood sugar goes below 70, or if it stays over 200.  Please continue a basal rate to 0.9 units/hr, 24 hrs per day.  Suspend it for 1-2 hrs, if you are active.   continue mealtime bolus of 1 unit/ 13 grams carbohydrate.  however, subtract 1 unit from calculated lunch bolus.  Add 3 units to calculated breakfast bolus.   continue correction bolus (which some people call "sensitivity," or "insulin sensitivity ratio," or just "isr") of 1 unit for each 50 by which your glucose exceeds 100. Please come back for a follow-up appointment in 3 months.   A diabetes blood test is requested for you today.  We'll contact you with results.

## 2013-06-13 ENCOUNTER — Encounter: Payer: Self-pay | Admitting: Gynecology

## 2013-06-13 ENCOUNTER — Ambulatory Visit (INDEPENDENT_AMBULATORY_CARE_PROVIDER_SITE_OTHER): Payer: BC Managed Care – PPO | Admitting: Gynecology

## 2013-06-13 VITALS — BP 112/70 | Ht 66.75 in | Wt 159.0 lb

## 2013-06-13 DIAGNOSIS — Z01419 Encounter for gynecological examination (general) (routine) without abnormal findings: Secondary | ICD-10-CM

## 2013-06-13 DIAGNOSIS — M949 Disorder of cartilage, unspecified: Secondary | ICD-10-CM

## 2013-06-13 DIAGNOSIS — M8580 Other specified disorders of bone density and structure, unspecified site: Secondary | ICD-10-CM

## 2013-06-13 DIAGNOSIS — M899 Disorder of bone, unspecified: Secondary | ICD-10-CM

## 2013-06-13 MED ORDER — NYSTATIN-TRIAMCINOLONE 100000-0.1 UNIT/GM-% EX CREA: 1.0000 "application " | TOPICAL_CREAM | Freq: Three times a day (TID) | CUTANEOUS | Status: DC

## 2013-06-13 NOTE — Progress Notes (Signed)
[name] [birth year] [id number]   History:    43 y.o.  for annual gyn exam who has not been seen since 2013.Patient is a type I diabetic who is being followed by Dr. Everardo [name]. He is also monitor her hyperlipidemia. Lab work was drawn at his office a few months ago. Patient's had a previous tubal sterilization procedure. Patient is having normal menstrual cycles. Patient with no past reported abnormal Pap smears.  Patient back in 2013 had a foot fracture and was informed that she had "osteoporosis". Dr. 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     Past medical history,surgical history, family history and social history were all reviewed and documented in the EPIC chart.  Gynecologic History Patient's last menstrual period was 05/24/2013. Contraception: tubal ligation Last Pap: 2013 . Results were: normal Last mammogram: 2014. Results were: normal  Obstetric History OB History  Gravida Para Term Preterm AB SAB TAB Ectopic Multiple Living  4 2 1 1 2 2    2     # Outcome Date GA Lbr Len/2nd Weight Sex Delivery Anes PTL Lv  4 SAB           3 SAB           2 TRM     M   N Y  1 PRE     F CS  Y Y       ROS: A ROS was performed and pertinent positives and negatives are included in the history.  GENERAL: No fevers or chills. HEENT: No change in vision, no earache, sore throat or sinus congestion. NECK: No pain or stiffness. CARDIOVASCULAR: No chest pain or pressure. No palpitations. PULMONARY: No shortness of breath, cough or wheeze. GASTROINTESTINAL: No abdominal pain, nausea, vomiting or diarrhea, melena or bright red blood per rectum. GENITOURINARY: No urinary frequency, urgency, hesitancy or dysuria. MUSCULOSKELETAL: No joint or muscle pain, no back pain, no recent trauma. DERMATOLOGIC: No rash, no  itching, no lesions. ENDOCRINE: No polyuria, polydipsia, no heat or cold intolerance. No recent change in weight. HEMATOLOGICAL: No anemia or easy bruising or bleeding. NEUROLOGIC: No headache, seizures, numbness, tingling or weakness. PSYCHIATRIC: No depression, no loss of interest in normal activity or change in sleep pattern.     Exam: chaperone present  BP 112/70  Ht 5' 6.75" (1.695 m)  Wt 159 lb (72.122 kg)  BMI 25.10 kg/m2  LMP 05/24/2013  Body mass index is 25.1 kg/(m^2).  General appearance : Well developed well nourished female. No acute distress HEENT: Neck supple, trachea midline, no carotid bruits, no thyroidmegaly Lungs: Clear to auscultation, no rhonchi or wheezes, or rib retractions  Heart: Regular rate and rhythm, no murmurs or gallops Breast:Examined in sitting and supine position were symmetrical in appearance, no palpable masses or tenderness,  no skin retraction, no nipple inversion, no nipple discharge, no skin discoloration, no axillary or supraclavicular lymphadenopathy Abdomen: no palpable masses or tenderness, no rebound or guarding Extremities: no edema or skin discoloration or tenderness  Pelvic:  Bartholin, Urethra, Skene Glands: Within normal limits             Vagina: No gross lesions or discharge  Cervix: No gross lesions or discharge  Uterus  anteverted, normal size, shape and consistency, non-tender and mobile  Adnexa  Without masses or tenderness  Anus and perineum  normal   Rectovaginal  normal sphincter tone without palpated masses or tenderness             Hemoccult not indicated     Assessment/Plan:  43 y.o. female for annual exam with type 1 diabetes being followed by Dr. Everardo [name]. 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 She was reminded that she needs a followup bone density study to see responsive therapy. We discussed the importance of calcium and vitamin D in regular  exercise. Pap smear was not done in accordance to the new guidelines. She was reminded to schedule her mammogram next month and to continue to do her monthly self breast exam. Patient was prescribed Mytrex Cream for her intertrigo when necessary.  Note: This dictation was prepared with  Dragon/digital dictation along withSmart phrase technology. Any transcriptional errors that result from this process are unintentional.   Molly EdwardsJuan H Fernandez MD, 10:46 AM 06/13/2013

## 2013-06-13 NOTE — Patient Instructions (Signed)
Bone Densitometry Bone densitometry is a special X-ray that measures your bone density and can be used to help predict your risk of bone fractures. This test is used to determine bone mineral content and density to diagnose osteoporosis. Osteoporosis is the loss of bone that may cause the bone to become weak. Osteoporosis commonly occurs in women entering menopause. However, it may be found in men and in people with other diseases. PREPARATION FOR TEST No preparation necessary. WHO SHOULD BE TESTED?  All women older than 65.  Postmenopausal women (50 to 65) with risk factors for osteoporosis.  People with a previous fracture caused by normal activities.  People with a small body frame (less than 127 poundsor a body mass index [BMI] of less than 21).  People who have a parent with a hip fracture or history of osteoporosis.  People who smoke.  People who have rheumatoid arthritis.  Anyone who engages in excessive alcohol use (more than 3 drinks most days).  Women who experience early menopause. WHEN SHOULD YOU BE RETESTED? Current guidelines suggest that you should wait at least 2 years before doing a bone density test again if your first test was normal.Recent studies indicated that women with normal bone density may be able to wait a few years before needing to repeat a bone density test. You should discuss this with your caregiver.  NORMAL FINDINGS   Normal: less than standard deviation below normal (greater than -1).  Osteopenia: 1 to 2.5 standard deviations below normal (-1 to -2.5).  Osteoporosis: greater than 2.5 standard deviations below normal (less than -2.5). Test results are reported as a "T score" and a "Z score."The T score is a number that compares your bone density with the bone density of healthy, young women.The Z score is a number that compares your bone density with the scores of women who are the same age, gender, and race.  Ranges for normal findings may vary  among different laboratories and hospitals. You should always check with your doctor after having lab work or other tests done to discuss the meaning of your test results and whether your values are considered within normal limits. MEANING OF TEST  Your caregiver will go over the test results with you and discuss the importance and meaning of your results, as well as treatment options and the need for additional tests if necessary. OBTAINING THE TEST RESULTS It is your responsibility to obtain your test results. Ask the lab or department performing the test when and how you will get your results. Document Released: 02/02/2004 Document Revised: 04/04/2011 Document Reviewed: 02/24/2010 [employer] Patient Information 2014 [employer].  

## 2013-07-01 ENCOUNTER — Ambulatory Visit (INDEPENDENT_AMBULATORY_CARE_PROVIDER_SITE_OTHER): Payer: BC Managed Care – PPO | Admitting: Endocrinology

## 2013-07-01 ENCOUNTER — Encounter: Payer: Self-pay | Admitting: Endocrinology

## 2013-07-01 VITALS — BP 120/60 | HR 77 | Temp 98.3°F | Ht 66.0 in | Wt 171.0 lb

## 2013-07-01 DIAGNOSIS — E109 Type 1 diabetes mellitus without complications: Secondary | ICD-10-CM

## 2013-07-01 LAB — HEMOGLOBIN A1C: HEMOGLOBIN A1C: 6.8 % — AB (ref 4.6–6.5)

## 2013-07-01 MED ORDER — CEFUROXIME AXETIL 250 MG PO TABS: 250.0000 mg | ORAL_TABLET | Freq: Two times a day (BID) | ORAL | Status: AC

## 2013-07-01 NOTE — Progress Notes (Signed)
Subjective:    Patient ID: Molly [name], female    DOB: [birth year], 43 y.o.   MRN: [id number]  HPI Pt returns for f/u of type 1 dm (dx'ed 1983, when she presented with DKA; she has been on insulin since dx; she has mild if any neuropathy of the lower extremities; no known associated chronic complications; her last episode of severe hypoglycemia was many years ago; last episode of DKA was in 2011 (due to pump malfunction); she has never had pancreatitis or severe hypoglycemia; she has a paradigm insulin pump; she declines continuous glucose monitor, due to cost).  no cbg record, but states cbg's are well-controlled.  She has mild hypoglycemia approx 2-3/month--no particular time of day or situation.   Pt states few days of moderate pain at the bilateral maxillary areas, and assoc nasal congestion.  One of her children is also ill.   Past Medical History  Diagnosis Date  . SMOKER 08/11/2009  . CONTUSION, ANKLE 08/28/2007  . SEBACEOUS CYST, INFECTED 07/13/2007  . TINEA VERSICOLOR 07/13/2007  . ALLERGIC RHINITIS CAUSE UNSPECIFIED 03/06/2007  . MIGRAINE, CHRONIC 03/06/2007  . DIABETES MELLITUS, TYPE I 08/03/2006  . DEPRESSION 08/03/2006  . THROMBOCYTOPENIA 05/07/2008  . Encounter for long-term (current) use of other medications   . DM retinopathy   . Bipolar 1 disorder     Past Surgical History  Procedure Laterality Date  . Abnormal u/s  12/10/1996  . Electrocardiogram  06/13/2006  . Tubal ligation      History   Social History  . Marital Status: Widowed    Spouse Name: N/A    Number of Children: N/A  . Years of Education: N/A   Occupational History  . Not on file.   Social History Main Topics  . Smoking status: Former Smoker    Quit date: 10/16/2010  . Smokeless tobacco: Never Used  . Alcohol Use: No  . Drug Use: No  . Sexual Activity: Yes     Comment: tubal ligation   Other Topics Concern  . Not on file   Social History Narrative   Does not work outside the home     Current Outpatient Prescriptions on File Prior to Visit  Medication Sig Dispense Refill  . Alcohol Swabs (ALCOHOL PREP) 70 % PADS 8/day  250.01  800 each  3  . alendronate (FOSAMAX) 70 MG tablet Take 1 tablet (70 mg total) by mouth every 7 (seven) days. Take with a full glass of water on an empty stomach.  4 tablet  11  . carbamazepine (TEGRETOL XR) 200 MG 12 hr tablet Take 2 by mouth every morning daily       . fluticasone (FLONASE) 50 MCG/ACT nasal spray Place into both nostrils daily.      Marland Kitchen glucose blood (FREESTYLE LITE) test strip 8/day dx 250.01, and lancets  800 each  3  . insulin aspart (NOVOLOG) 100 UNIT/ML injection For use in pump, total 70 units/day  7 vial  12  . Insulin Infusion Pump Supplies (MINIMED INFUSION SET-MMT 398) MISC Inject 1 Device into the skin 3 days. Change every 3 days  30 each  3  . Insulin Infusion Pump Supplies (PARADIGM RESERVOIR ) MISC Inject 1 Device into the skin 3 days. Change every 3 days  30 each  3  . lamoTRIgine (LAMICTAL) 200 MG tablet Take 200 mg by mouth 2 (two) times daily.       . Lancets (FREESTYLE) lancets Use as instructed dx 250.01  800  each  3  . nystatin-triamcinolone (MYCOLOG II) cream Apply 1 application topically 3 (three) times daily.  30 g  2  . rosuvastatin (CRESTOR) 40 MG tablet Take 1 tablet (40 mg total) by mouth daily. 1 tablet at bedtime  90 tablet  3  . Transparent Dressings (OPSITE IV 3000) MISC Inject 1 Device into the skin 3 days.  30 each  3  . Transparent Dressings (OPSITE IV 3000) MISC Inject 1 Device into the skin 3 days. Change every 3 days  30 each  3  . ziprasidone (GEODON) 60 MG capsule Take 2 by mouth at bedtime        No current facility-administered medications on file prior to visit.    Allergies  Allergen Reactions  . Varenicline Tartrate     REACTION: rash    Family History  Problem Relation Age of Onset  . Diabetes Father     (Oral Agents)  . Diabetes Paternal Grandmother   . Hypertension  Paternal Grandmother   . Diabetes Paternal Grandfather   . Cancer Paternal Grandfather     colon  . Hypertension Paternal Grandfather     BP 120/60  Pulse 77  Temp(Src) 98.3 F (36.8 C) (Oral)  Ht 5\' 6"  (1.676 m)  Wt 171 lb (77.565 kg)  BMI 27.61 kg/m2  SpO2 97%  LMP 05/24/2013    Review of Systems Denies fever, earache, or cough.      Objective:   Physical Exam VITAL SIGNS:  See vs page GENERAL: no distress head: no deformity eyes: no periorbital swelling, no proptosis external nose and ears are normal mouth: no lesion seen Both tm's are slightly red LUNGS:  Clear to auscultation   Lab Results  Component Value Date   HGBA1C 6.8* 07/01/2013      Assessment & Plan:  URI: new. DM: well-controlled.   Patient is advised the following: Patient Instructions  zyrtec-d (non-prescription) will help your congestion.  i have sent a prescription to your pharmacy, for an antibiotic pill.  blood tests are being requested for you today.  We'll contact you with results. I hope you feel better soon.  If you don't feel better by next week, please call back.  Please call sooner if you get worse. Please come back for a regular physical appointment in 3 months.

## 2013-07-01 NOTE — Patient Instructions (Addendum)
zyrtec-d (non-prescription) will help your congestion.  i have sent a prescription to your pharmacy, for an antibiotic pill.  blood tests are being requested for you today.  We'll contact you with results. I hope you feel better soon.  If you don't feel better by next week, please call back.  Please call sooner if you get worse. Please come back for a regular physical appointment in 3 months.

## 2013-07-08 ENCOUNTER — Other Ambulatory Visit: Payer: Self-pay | Admitting: Endocrinology

## 2013-08-05 ENCOUNTER — Ambulatory Visit: Payer: BC Managed Care – PPO | Admitting: Endocrinology

## 2013-09-24 ENCOUNTER — Telehealth: Payer: Self-pay | Admitting: Endocrinology

## 2013-09-24 MED ORDER — INSULIN ASPART 100 UNIT/ML FLEXPEN: 10.0000 [IU] | PEN_INJECTOR | Freq: Three times a day (TID) | SUBCUTANEOUS | Status: DC

## 2013-09-24 MED ORDER — INSULIN GLARGINE 100 UNIT/ML SOLOSTAR PEN: 35.0000 [IU] | PEN_INJECTOR | Freq: Every day | SUBCUTANEOUS | Status: DC

## 2013-09-24 NOTE — Telephone Encounter (Signed)
i have sent a prescription to your pharmacy, for lantus and novolog pens.

## 2013-09-24 NOTE — Telephone Encounter (Signed)
See below and please advise on how you would like to proceed.   Thanks!

## 2013-09-24 NOTE — Telephone Encounter (Signed)
pts pump is not working and has been dying for the past few days. She needs advise and rxs for doing manual insulin asap  Do you want to see her or can we just call in the meds and supplies. I offered for her to see Bonita Quin but she did not want to do that.  If we can call in supplies and med please call into [facility] [phone]

## 2013-09-24 NOTE — Telephone Encounter (Signed)
Pt advised that Novolog and Lantus pens has been sent. Pt advised over the phone how to use pens. Pt stated during the phone call that she wanted the order for her new pump settings before she comes to see linda. Please advise, not sure how to proceed.  Thanks!

## 2013-09-24 NOTE — Telephone Encounter (Signed)
Please continue the same pump settings.

## 2013-10-03 ENCOUNTER — Other Ambulatory Visit: Payer: Self-pay | Admitting: Endocrinology

## 2013-10-03 DIAGNOSIS — E109 Type 1 diabetes mellitus without complications: Secondary | ICD-10-CM

## 2013-10-04 ENCOUNTER — Ambulatory Visit (INDEPENDENT_AMBULATORY_CARE_PROVIDER_SITE_OTHER): Payer: BC Managed Care – PPO | Admitting: Endocrinology

## 2013-10-04 ENCOUNTER — Encounter: Payer: Self-pay | Admitting: Endocrinology

## 2013-10-04 VITALS — BP 132/72 | HR 69 | Temp 98.2°F | Ht 66.0 in | Wt 173.0 lb

## 2013-10-04 DIAGNOSIS — E109 Type 1 diabetes mellitus without complications: Secondary | ICD-10-CM

## 2013-10-04 DIAGNOSIS — Z79899 Other long term (current) drug therapy: Secondary | ICD-10-CM

## 2013-10-04 DIAGNOSIS — Z23 Encounter for immunization: Secondary | ICD-10-CM

## 2013-10-04 DIAGNOSIS — Z0000 Encounter for general adult medical examination without abnormal findings: Secondary | ICD-10-CM | POA: Insufficient documentation

## 2013-10-04 DIAGNOSIS — E7800 Pure hypercholesterolemia, unspecified: Secondary | ICD-10-CM

## 2013-10-04 DIAGNOSIS — R319 Hematuria, unspecified: Secondary | ICD-10-CM

## 2013-10-04 DIAGNOSIS — D696 Thrombocytopenia, unspecified: Secondary | ICD-10-CM

## 2013-10-04 LAB — URINALYSIS, ROUTINE W REFLEX MICROSCOPIC
Bilirubin Urine: NEGATIVE
Ketones, ur: NEGATIVE
Leukocytes, UA: NEGATIVE
Nitrite: NEGATIVE
PH: 7 (ref 5.0–8.0)
Specific Gravity, Urine: <=1.005 — AB (ref 1.000–1.030)
Total Protein, Urine: NEGATIVE
UROBILINOGEN UA: 0.2 (ref 0.0–1.0)

## 2013-10-04 LAB — BASIC METABOLIC PANEL
BUN: 7 mg/dL (ref 6–23)
CALCIUM: 9.5 mg/dL (ref 8.4–10.5)
CHLORIDE: 101 meq/L (ref 96–112)
CO2: 27 mEq/L (ref 19–32)
CREATININE: 0.7 mg/dL (ref 0.4–1.2)
GFR: 96.62 mL/min (ref >60.00)
GLUCOSE: 53 mg/dL — AB (ref 70–99)
Potassium: 4.1 mEq/L (ref 3.5–5.1)
Sodium: 135 mEq/L (ref 135–145)

## 2013-10-04 LAB — HEPATIC FUNCTION PANEL
ALBUMIN: 4.3 g/dL (ref 3.5–5.2)
ALK PHOS: 45 U/L (ref 39–117)
ALT: 20 U/L (ref 0–35)
AST: 25 U/L (ref 0–37)
Bilirubin, Direct: 0 mg/dL (ref 0.0–0.3)
TOTAL PROTEIN: 7.1 g/dL (ref 6.0–8.3)
Total Bilirubin: 0.5 mg/dL (ref 0.2–1.2)

## 2013-10-04 LAB — MICROALBUMIN / CREATININE URINE RATIO
CREATININE, U: 51.9 mg/dL
Microalb Creat Ratio: 0.4 mg/g (ref 0.0–30.0)
Microalb, Ur: 0.2 mg/dL (ref 0.0–1.9)

## 2013-10-04 LAB — CBC WITH DIFFERENTIAL/PLATELET
BASOS ABS: 0 10*3/uL (ref 0.0–0.1)
Basophils Relative: 0.9 % (ref 0.0–3.0)
EOS ABS: 0 10*3/uL (ref 0.0–0.7)
Eosinophils Relative: 1.2 % (ref 0.0–5.0)
HCT: 41.1 % (ref 36.0–46.0)
Hemoglobin: 13.8 g/dL (ref 12.0–15.0)
LYMPHS PCT: 23.3 % (ref 12.0–46.0)
Lymphs Abs: 0.8 10*3/uL (ref 0.7–4.0)
MCHC: 33.6 g/dL (ref 30.0–36.0)
MCV: 95 fl (ref 78.0–100.0)
MONOS PCT: 12.1 % — AB (ref 3.0–12.0)
Monocytes Absolute: 0.4 10*3/uL (ref 0.1–1.0)
NEUTROS ABS: 2.3 10*3/uL (ref 1.4–7.7)
NEUTROS PCT: 62.5 % (ref 43.0–77.0)
Platelets: 141 10*3/uL — ABNORMAL LOW (ref 150.0–400.0)
RBC: 4.32 Mil/uL (ref 3.87–5.11)
RDW: 12.9 % (ref 11.5–15.5)
WBC: 3.6 10*3/uL — ABNORMAL LOW (ref 4.0–10.5)

## 2013-10-04 LAB — HEMOGLOBIN A1C: Hgb A1c MFr Bld: 6.6 % — ABNORMAL HIGH (ref 4.6–6.5)

## 2013-10-04 LAB — TSH: TSH: 0.63 u[IU]/mL (ref 0.35–4.50)

## 2013-10-04 LAB — LIPID PANEL
CHOLESTEROL: 172 mg/dL (ref 0–200)
HDL: 77.7 mg/dL (ref >39.00)
LDL Cholesterol: 83 mg/dL (ref 0–99)
NONHDL: 94.3
TRIGLYCERIDES: 57 mg/dL (ref 0.0–149.0)
Total CHOL/HDL Ratio: 2
VLDL: 11.4 mg/dL (ref 0.0–40.0)

## 2013-10-04 NOTE — Patient Instructions (Addendum)
check your blood sugar 8 times a day.  vary the time of day when you check, between before the 3 meals, and at bedtime.  also check if you have symptoms of your blood sugar being too high or too low.  please keep a record of the readings and bring it to your next appointment here.  please call us sooner if your blood sugar goes below 70, or if it stays over 200.  Please continue a basal rate to 0.9 units/hr, 24 hrs per day.  Suspend it for 1-2 hrs, if you are active.   continue mealtime bolus of 1 unit/ 13 grams carbohydrate.  however, subtract 1 unit from calculated lunch bolus.  Add 3 units to calculated breakfast bolus.   continue correction bolus (which some people call "sensitivity," or "insulin sensitivity ratio," or just "isr") of 1 unit for each 50 by which your glucose exceeds 100. Please come back for a follow-up appointment in 3 months.   blood tests are being requested for you today.  We'll contact you with results.  please consider these measures for your health:  minimize alcohol.  do not use tobacco products.  have a colonoscopy at least every 10 years from age 50.  Women should have an annual mammogram from age 61.  keep firearms safely stored.  always use seat belts.  have working smoke alarms in your home.  see an eye doctor and dentist regularly.  never drive under the influence of alcohol or drugs (including prescription drugs).  those with fair skin should take precautions against the sun.

## 2013-10-04 NOTE — Progress Notes (Signed)
Subjective:    Patient ID: Molly [name], female    DOB: 1970-01-31, 43 y.o.   MRN: [id number]  HPI Pt is here for regular wellness examination, and is feeling pretty well in general, and says chronic med probs are stable, except as noted below Past Medical History  Diagnosis Date  . SMOKER 08/11/2009  . CONTUSION, ANKLE 08/28/2007  . SEBACEOUS CYST, INFECTED 07/13/2007  . TINEA VERSICOLOR 07/13/2007  . ALLERGIC RHINITIS CAUSE UNSPECIFIED 03/06/2007  . MIGRAINE, CHRONIC 03/06/2007  . DIABETES MELLITUS, TYPE I 08/03/2006  . DEPRESSION 08/03/2006  . THROMBOCYTOPENIA 05/07/2008  . Encounter for long-term (current) use of other medications   . DM retinopathy   . Bipolar 1 disorder     Past Surgical History  Procedure Laterality Date  . Abnormal u/s  12/10/1996  . Electrocardiogram  06/13/2006  . Tubal ligation      History   Social History  . Marital Status: Widowed    Spouse Name: N/A    Number of Children: N/A  . Years of Education: N/A   Occupational History  . Not on file.   Social History Main Topics  . Smoking status: Former Smoker    Quit date: 10/16/2010  . Smokeless tobacco: Never Used  . Alcohol Use: No  . Drug Use: No  . Sexual Activity: Yes     Comment: tubal ligation   Other Topics Concern  . Not on file   Social History Narrative   Does not work outside the home    Current Outpatient Prescriptions on File Prior to Visit  Medication Sig Dispense Refill  . Alcohol Swabs (ALCOHOL PREP) 70 % PADS 8/day  250.01  800 each  3  . alendronate (FOSAMAX) 70 MG tablet TAKE 1 TABLET BY MOUTH EVERY 7 DAYS. TAKE WITH FULL GLASS OF WATER ON AN EMPTY STOMACH  4 tablet  3  . carbamazepine (TEGRETOL XR) 200 MG 12 hr tablet Take 2 by mouth every morning daily       . fluticasone (FLONASE) 50 MCG/ACT nasal spray Place into both nostrils daily.      Marland Kitchen glucose blood (FREESTYLE LITE) test strip 8/day dx 250.01, and lancets  800 each  3  . insulin aspart (NOVOLOG FLEXPEN) 100  UNIT/ML FlexPen Inject 10 Units into the skin 3 (three) times daily with meals. And pen needles 4/day  15 mL  11  . insulin aspart (NOVOLOG) 100 UNIT/ML injection For use in pump, total 70 units/day  7 vial  12  . Insulin Infusion Pump Supplies (MINIMED INFUSION SET-MMT 398) MISC Inject 1 Device into the skin 3 days. Change every 3 days  30 each  3  . Insulin Infusion Pump Supplies (PARADIGM RESERVOIR ) MISC Inject 1 Device into the skin 3 days. Change every 3 days  30 each  3  . lamoTRIgine (LAMICTAL) 200 MG tablet Take 200 mg by mouth 2 (two) times daily.       . Lancets (FREESTYLE) lancets Use as instructed dx 250.01  800 each  3  . nystatin-triamcinolone (MYCOLOG II) cream Apply 1 application topically 3 (three) times daily.  30 g  2  . rosuvastatin (CRESTOR) 40 MG tablet Take 1 tablet (40 mg total) by mouth daily. 1 tablet at bedtime  90 tablet  3  . Transparent Dressings (OPSITE IV 3000) MISC Inject 1 Device into the skin 3 days.  30 each  3  . Transparent Dressings (OPSITE IV 3000) MISC Inject 1 Device into  the skin 3 days. Change every 3 days  30 each  3  . ziprasidone (GEODON) 60 MG capsule Take 2 by mouth at bedtime       . Insulin Glargine (LANTUS SOLOSTAR) 100 UNIT/ML Solostar Pen Inject 35 Units into the skin daily at 10 pm.  5 pen  PRN   No current facility-administered medications on file prior to visit.    Allergies  Allergen Reactions  . Varenicline Tartrate     REACTION: rash    Family History  Problem Relation Age of Onset  . Diabetes Father     (Oral Agents)  . Diabetes Paternal Grandmother   . Hypertension Paternal Grandmother   . Diabetes Paternal Grandfather   . Cancer Paternal Grandfather     colon  . Hypertension Paternal Grandfather     BP 132/72  Pulse 69  Temp(Src) 98.2 F (36.8 C) (Oral)  Ht  (1.676 m)  Wt 173 lb (78.472 kg)  BMI 27.94 kg/m2  SpO2 98%  Review of Systems  Constitutional: Negative for fever and unexpected weight change.    HENT: Negative for hearing loss.   Eyes: Negative for visual disturbance.  Respiratory: Negative for shortness of breath.   Cardiovascular: Negative for chest pain.  Gastrointestinal: Negative for anal bleeding.  Endocrine: Negative for cold intolerance.  Genitourinary: Negative for hematuria.  Musculoskeletal: Negative for back pain.  Skin: Negative for rash.  Allergic/Immunologic: Positive for environmental allergies.  Neurological: Negative for numbness.  Hematological: Does not bruise/bleed easily.  Psychiatric/Behavioral:       Insomnia is well-controlled       Objective:   Physical Exam VS: see vs page GEN: no distress HEAD: head: no deformity eyes: no periorbital swelling, no proptosis external nose and ears are normal mouth: no lesion seen NECK: supple, thyroid is not enlarged CHEST WALL: no deformity LUNGS:  Clear to auscultation BREASTS: sees gyn CV: reg rate and rhythm, no murmur ABD: abdomen is soft, nontender.  no hepatosplenomegaly.  not distended.  no hernia GENITALIA/RECTAL: sees gyn MUSCULOSKELETAL: muscle bulk and strength are grossly normal.  no obvious joint swelling.  gait is normal and steady PULSES:  no carotid bruit NEURO:  cn 2-12 grossly intact.   readily moves all 4's.   SKIN:  Normal texture and temperature.  No rash or suspicious lesion is visible.   NODES:  None palpable at the neck PSYCH: alert, well-oriented.  Does not appear anxious nor depressed.        Assessment & Plan:  Wellness visit today, with problems stable, except as noted. please consider these measures for your health:  minimize alcohol.  do not use tobacco products.  have a colonoscopy at least every 10 years from age 62.  Women should have an annual mammogram from age 31.  keep firearms safely stored.  always use seat belts.  have working smoke alarms in your home.  see an eye doctor and dentist regularly.  never drive under the influence of alcohol or drugs (including  prescription drugs).  those with fair skin should take precautions against the sun.    SEPARATE EVALUATION FOLLOWS--EACH PROBLEM HERE IS NEW, NOT RESPONDING TO TREATMENT, OR POSES SIGNIFICANT RISK TO THE PATIENT'S HEALTH: HISTORY OF THE PRESENT ILLNESS: Pt returns for f/u of type 1 dm (dx'ed 1983, when she presented with DKA; she has been on insulin since dx; no chronic complications; her last episode of severe hypoglycemia was many years ago; last episode of DKA was in 2011 (  due to pump malfunction); she has never had pancreatitis or severe hypoglycemia; she has a paradigm insulin pump; she declines continuous glucose monitor, due to cost).  no cbg record, but states cbg's are mildly low approx 2-3/month--usually fasting.  She thinks this may be due to the new pump, which needs to be programmed. She averages approx 38 total units per day.  She recently had a pump malfunction, and had to have a new one shipped.  She has received it, and will see Bonita Quin in a few days.   PAST MEDICAL HISTORY reviewed and up to date today REVIEW OF SYSTEMS: Denies LOC PHYSICAL EXAMINATION: VITAL SIGNS:  See vs page GENERAL: no distress Pulses: dorsalis pedis intact bilat.   Feet: no deformity.  no edema Skin:  no ulcer on the feet.  normal color and temp. Neuro: sensation is intact to touch on the feet LAB/XRAY RESULTS: i reviewed electrocardiogram: no change since 2014 Lab Results  Component Value Date   HGBA1C 6.6* 10/04/2013  IMPRESSION: DM: slightly overcontrolled.  As pt says her new pump may need calibration, i'll hold off on any adjustment until she has a chance to see Bonita Quin. PLAN:  Please see Bonita Quin as scheduled.  Call if hypoglycemic episodes recur

## 2013-10-16 ENCOUNTER — Encounter: Payer: BC Managed Care – PPO | Attending: Endocrinology | Admitting: Nutrition

## 2013-10-16 DIAGNOSIS — Z713 Dietary counseling and surveillance: Secondary | ICD-10-CM | POA: Insufficient documentation

## 2013-10-16 DIAGNOSIS — E109 Type 1 diabetes mellitus without complications: Secondary | ICD-10-CM | POA: Diagnosis not present

## 2013-10-16 DIAGNOSIS — Z794 Long term (current) use of insulin: Secondary | ICD-10-CM | POA: Diagnosis not present

## 2013-10-16 NOTE — Progress Notes (Signed)
This patient's pump failed and needed help with setting up her bolus wizard.  She had set the basal rate and all other settings correctly, but was not able to do the bolus wizard set up.   She was shown how to do this, and the settings were put in according to Dr. Thom Chimes orders:  I/C ratio: 12, sensitivity: 40, timing: 4 hours, target: 100-120.  She had no final questions.

## 2013-10-16 NOTE — Patient Instructions (Signed)
Review directions in manual for setting and adjusting the basal rates

## 2013-10-18 ENCOUNTER — Other Ambulatory Visit: Payer: Self-pay | Admitting: Endocrinology

## 2013-10-22 ENCOUNTER — Ambulatory Visit (INDEPENDENT_AMBULATORY_CARE_PROVIDER_SITE_OTHER): Payer: BC Managed Care – PPO | Admitting: Endocrinology

## 2013-10-22 ENCOUNTER — Encounter: Payer: Self-pay | Admitting: Endocrinology

## 2013-10-22 VITALS — BP 134/72 | HR 64 | Temp 98.7°F | Ht 66.0 in | Wt 176.0 lb

## 2013-10-22 DIAGNOSIS — M25529 Pain in unspecified elbow: Secondary | ICD-10-CM | POA: Insufficient documentation

## 2013-10-22 DIAGNOSIS — M25522 Pain in left elbow: Secondary | ICD-10-CM

## 2013-10-22 MED ORDER — MUPIROCIN 2 % EX OINT: 1.0000 "application " | TOPICAL_OINTMENT | Freq: Three times a day (TID) | CUTANEOUS | Status: DC

## 2013-10-22 NOTE — Patient Instructions (Addendum)
Please see a sports-medicine specialist.  you will receive a phone call, about a day and time for an appointment. i have sent a prescription to your pharmacy, to refill the skin ointment. I hope you feel better soon.

## 2013-10-22 NOTE — Progress Notes (Signed)
Subjective:    Patient ID: Molly [name], female    DOB: Nov 11, 1970, 43 y.o.   MRN: [id number]  HPI Pt states 2 years of moderate pain at the left elbow, but no assoc numbness.  Aleve reduces, but does not stop the pain altogether.   She has very dry skin of the feet, with skin cracking.  bactroban helps. Past Medical History  Diagnosis Date  . SMOKER 08/11/2009  . CONTUSION, ANKLE 08/28/2007  . SEBACEOUS CYST, INFECTED 07/13/2007  . TINEA VERSICOLOR 07/13/2007  . ALLERGIC RHINITIS CAUSE UNSPECIFIED 03/06/2007  . MIGRAINE, CHRONIC 03/06/2007  . DIABETES MELLITUS, TYPE I 08/03/2006  . DEPRESSION 08/03/2006  . THROMBOCYTOPENIA 05/07/2008  . Encounter for long-term (current) use of other medications   . DM retinopathy   . Bipolar 1 disorder     Past Surgical History  Procedure Laterality Date  . Abnormal u/s  12/10/1996  . Electrocardiogram  06/13/2006  . Tubal ligation      History   Social History  . Marital Status: Widowed    Spouse Name: N/A    Number of Children: N/A  . Years of Education: N/A   Occupational History  . Not on file.   Social History Main Topics  . Smoking status: Former Smoker    Quit date: 10/16/2010  . Smokeless tobacco: Never Used  . Alcohol Use: No  . Drug Use: No  . Sexual Activity: Yes     Comment: tubal ligation   Other Topics Concern  . Not on file   Social History Narrative   Does not work outside the home    Current Outpatient Prescriptions on File Prior to Visit  Medication Sig Dispense Refill  . Alcohol Swabs (ALCOHOL PREP) 70 % PADS 8/day  250.01  800 each  3  . alendronate (FOSAMAX) 70 MG tablet TAKE 1 TABLET BY MOUTH EVERY 7 DAYS. TAKE WITH FULL GLASS OF WATER ON AN EMPTY STOMACH  4 tablet  3  . carbamazepine (TEGRETOL XR) 200 MG 12 hr tablet Take 2 by mouth every morning daily       . fluticasone (FLONASE) 50 MCG/ACT nasal spray Place into both nostrils daily.      Marland Kitchen. glucose blood (FREESTYLE LITE) test strip 8/day dx 250.01,  and lancets  800 each  3  . insulin aspart (NOVOLOG FLEXPEN) 100 UNIT/ML FlexPen Inject 10 Units into the skin 3 (three) times daily with meals. And pen needles 4/day  15 mL  11  . insulin aspart (NOVOLOG) 100 UNIT/ML injection For use in pump, total 70 units/day  7 vial  12  . Insulin Glargine (LANTUS SOLOSTAR) 100 UNIT/ML Solostar Pen Inject 35 Units into the skin daily at 10 pm.  5 pen  PRN  . Insulin Infusion Pump Supplies (MINIMED INFUSION SET-MMT 398) MISC Inject 1 Device into the skin 3 days. Change every 3 days  30 each  3  . Insulin Infusion Pump Supplies (PARADIGM RESERVOIR 3ML) MISC Inject 1 Device into the skin 3 days. Change every 3 days  30 each  3  . lamoTRIgine (LAMICTAL) 200 MG tablet Take 200 mg by mouth 2 (two) times daily.       . Lancets (FREESTYLE) lancets Use as instructed dx 250.01  800 each  3  . nystatin-triamcinolone (MYCOLOG II) cream Apply 1 application topically 3 (three) times daily.  30 g  2  . rosuvastatin (CRESTOR) 40 MG tablet Take 1 tablet (40 mg total) by mouth daily. 1  tablet at bedtime  90 tablet  3  . Transparent Dressings (OPSITE IV 3000) MISC Inject 1 Device into the skin 3 days.  30 each  3  . Transparent Dressings (OPSITE IV 3000) MISC Inject 1 Device into the skin 3 days. Change every 3 days  30 each  3  . ziprasidone (GEODON) 60 MG capsule Take 2 by mouth at bedtime        No current facility-administered medications on file prior to visit.    Allergies  Allergen Reactions  . Varenicline Tartrate     REACTION: rash    Family History  Problem Relation Age of Onset  . Diabetes Father     (Oral Agents)  . Diabetes Paternal Grandmother   . Hypertension Paternal Grandmother   . Diabetes Paternal Grandfather   . Cancer Paternal Grandfather     colon  . Hypertension Paternal Grandfather     BP 134/72  Pulse 64  Temp(Src) 98.7 F (37.1 C) (Oral)  Ht 5\' 6"  (1.676 m)  Wt 176 lb (79.833 kg)  BMI 28.42 kg/m2  SpO2 97%   Review of  Systems Denies fever.  Denies elbow swelling.      Objective:   Physical Exam VITAL SIGNS:  See vs page GENERAL: no distress Left elbow: Normal to my exam. Feet: very dry skin.      Assessment & Plan:  Elbow pain, new Dyshidrosis of feet, worse.     Patient is advised the following: Patient Instructions  Please see a sports-medicine specialist.  you will receive a phone call, about a day and time for an appointment. i have sent a prescription to your pharmacy, to refill the skin ointment. I hope you feel better soon.

## 2013-11-25 ENCOUNTER — Encounter: Payer: Self-pay | Admitting: Endocrinology

## 2013-12-24 ENCOUNTER — Ambulatory Visit: Payer: BC Managed Care – PPO | Admitting: Endocrinology

## 2014-01-06 ENCOUNTER — Ambulatory Visit: Payer: BC Managed Care – PPO | Admitting: Endocrinology

## 2014-01-07 ENCOUNTER — Ambulatory Visit (INDEPENDENT_AMBULATORY_CARE_PROVIDER_SITE_OTHER): Payer: BC Managed Care – PPO | Admitting: Endocrinology

## 2014-01-07 ENCOUNTER — Other Ambulatory Visit: Payer: Self-pay

## 2014-01-07 ENCOUNTER — Encounter: Payer: Self-pay | Admitting: Endocrinology

## 2014-01-07 VITALS — BP 118/64 | HR 74 | Temp 98.5°F | Ht 66.0 in | Wt 165.0 lb

## 2014-01-07 DIAGNOSIS — E109 Type 1 diabetes mellitus without complications: Secondary | ICD-10-CM

## 2014-01-07 LAB — HEMOGLOBIN A1C: Hgb A1c MFr Bld: 7.4 % — ABNORMAL HIGH (ref 4.6–6.5)

## 2014-01-07 MED ORDER — ROSUVASTATIN CALCIUM 40 MG PO TABS: 40.0000 mg | ORAL_TABLET | Freq: Every day | ORAL | Status: DC

## 2014-01-07 NOTE — Patient Instructions (Addendum)
A diabetes blood test is requested for you today.  We'll let you know about the results.   Please continue to carry a non-perishable snack.  Please see Bonita QuinLinda when you get the continuous glucose monitor.   check your blood sugar 8 times a day.  vary the time of day when you check, between before the 3 meals, and at bedtime.  also check if you have symptoms of your blood sugar being too high or too low.  please keep a record of the readings and bring it to your next appointment here.  You can write it on any piece of paper.  please call us sooner if your blood sugar goes below 70, or if you have a lot of readings over 200.

## 2014-01-07 NOTE — Progress Notes (Signed)
Subjective:    Patient ID: Molly [name], female    DOB: 05-Nov-1970, 43 y.o.   MRN: [id number]  HPI  Pt returns for f/u of diabetes mellitus: DM type: 1 Dx'ed: 1983 Complications: none Therapy: insulin since dx DKA: last episode was in 2011 Severe hypoglycemia: last episode was many years ago.  Pancreatitis: never Other: she has a paradigm insulin pump; she declines continuous glucose monitor, due to cost Interval history: she has mild hypoglycemia approx once a week, usually with activity.   Past Medical History  Diagnosis Date  . SMOKER 08/11/2009  . CONTUSION, ANKLE 08/28/2007  . SEBACEOUS CYST, INFECTED 07/13/2007  . TINEA VERSICOLOR 07/13/2007  . ALLERGIC RHINITIS CAUSE UNSPECIFIED 03/06/2007  . MIGRAINE, CHRONIC 03/06/2007  . DIABETES MELLITUS, TYPE I 08/03/2006  . DEPRESSION 08/03/2006  . THROMBOCYTOPENIA 05/07/2008  . Encounter for long-term (current) use of other medications   . DM retinopathy   . Bipolar 1 disorder     Past Surgical History  Procedure Laterality Date  . Abnormal u/s  12/10/1996  . Electrocardiogram  06/13/2006  . Tubal ligation      History   Social History  . Marital Status: Widowed    Spouse Name: N/A    Number of Children: N/A  . Years of Education: N/A   Occupational History  . Not on file.   Social History Main Topics  . Smoking status: Former Smoker    Quit date: 10/16/2010  . Smokeless tobacco: Never Used  . Alcohol Use: No  . Drug Use: No  . Sexual Activity: Yes     Comment: tubal ligation   Other Topics Concern  . Not on file   Social History Narrative   Does not work outside the home    Current Outpatient Prescriptions on File Prior to Visit  Medication Sig Dispense Refill  . Alcohol Swabs (ALCOHOL PREP) 70 % PADS 8/day  250.01 800 each 3  . alendronate (FOSAMAX) 70 MG tablet TAKE 1 TABLET BY MOUTH EVERY 7 DAYS. TAKE WITH FULL GLASS OF WATER ON AN EMPTY STOMACH 4 tablet 3  . carbamazepine (TEGRETOL XR) 200 MG 12 hr  tablet Take 2 by mouth every morning daily     . fluticasone (FLONASE) 50 MCG/ACT nasal spray Place into both nostrils daily.    Marland Kitchen. glucose blood (FREESTYLE LITE) test strip 8/day dx 250.01, and lancets 800 each 3  . insulin aspart (NOVOLOG FLEXPEN) 100 UNIT/ML FlexPen Inject 10 Units into the skin 3 (three) times daily with meals. And pen needles 4/day 15 mL 11  . insulin aspart (NOVOLOG) 100 UNIT/ML injection For use in pump, total 70 units/day 7 vial 12  . Insulin Glargine (LANTUS SOLOSTAR) 100 UNIT/ML Solostar Pen Inject 35 Units into the skin daily at 10 pm. 5 pen PRN  . Insulin Infusion Pump Supplies (MINIMED INFUSION SET-MMT 398) MISC Inject 1 Device into the skin 3 days. Change every 3 days 30 each 3  . Insulin Infusion Pump Supplies (PARADIGM RESERVOIR 3ML) MISC Inject 1 Device into the skin 3 days. Change every 3 days 30 each 3  . lamoTRIgine (LAMICTAL) 200 MG tablet Take 200 mg by mouth 2 (two) times daily.     . Lancets (FREESTYLE) lancets Use as instructed dx 250.01 800 each 3  . mupirocin ointment (BACTROBAN) 2 % Apply 1 application topically 3 (three) times daily. As needed 30 g 3  . nystatin-triamcinolone (MYCOLOG II) cream Apply 1 application topically 3 (three) times daily. 30  g 2  . Transparent Dressings (OPSITE IV 3000) MISC Inject 1 Device into the skin 3 days. 30 each 3  . Transparent Dressings (OPSITE IV 3000) MISC Inject 1 Device into the skin 3 days. Change every 3 days 30 each 3  . ziprasidone (GEODON) 60 MG capsule Take 2 by mouth at bedtime      No current facility-administered medications on file prior to visit.    Allergies  Allergen Reactions  . Varenicline Tartrate     REACTION: rash    Family History  Problem Relation Age of Onset  . Diabetes Father     (Oral Agents)  . Diabetes Paternal Grandmother   . Hypertension Paternal Grandmother   . Diabetes Paternal Grandfather   . Cancer Paternal Grandfather     colon  . Hypertension Paternal Grandfather     BP 118/64 mmHg  Pulse 74  Temp(Src) 98.5 F (36.9 C) (Oral)  Ht 5\' 6"  (1.676 m)  Wt 165 lb (74.844 kg)  BMI 26.64 kg/m2  SpO2 98%  Review of Systems Denies LOC and weight change.    Objective:   Physical Exam VITAL SIGNS:  See vs page GENERAL: no distress Pulses: dorsalis pedis intact bilat.   Feet: no deformity.  no edema Skin:  no ulcer on the feet.  normal color and temp. Neuro: sensation is intact to touch on the feet.     Lab Results  Component Value Date   HGBA1C 7.4* 01/07/2014      Assessment & Plan:  DM: mild exacerbation.   Side-effect of rx; mild hypoglycemia: we need to carefully adjust pump settings.    Patient is advised the following: Patient Instructions  A diabetes blood test is requested for you today.  We'll let you know about the results.   Please continue to carry a non-perishable snack.  Please see Bonita QuinLinda when you get the continuous glucose monitor.   check your blood sugar 8 times a day.  vary the time of day when you check, between before the 3 meals, and at bedtime.  also check if you have symptoms of your blood sugar being too high or too low.  please keep a record of the readings and bring it to your next appointment here.  You can write it on any piece of paper.  please call us sooner if your blood sugar goes below 70, or if you have a lot of readings over 200.   same insulin via pump for now.

## 2014-01-08 ENCOUNTER — Ambulatory Visit: Payer: BC Managed Care – PPO | Admitting: Family Medicine

## 2014-02-16 ENCOUNTER — Other Ambulatory Visit: Payer: Self-pay | Admitting: Endocrinology

## 2014-03-04 ENCOUNTER — Encounter: Payer: Self-pay | Admitting: Endocrinology

## 2014-03-04 ENCOUNTER — Ambulatory Visit (INDEPENDENT_AMBULATORY_CARE_PROVIDER_SITE_OTHER): Payer: BLUE CROSS/BLUE SHIELD | Admitting: Endocrinology

## 2014-03-04 VITALS — BP 118/62 | HR 68 | Temp 98.0°F | Ht 66.0 in | Wt 171.0 lb

## 2014-03-04 DIAGNOSIS — E109 Type 1 diabetes mellitus without complications: Secondary | ICD-10-CM

## 2014-03-04 DIAGNOSIS — J309 Allergic rhinitis, unspecified: Secondary | ICD-10-CM

## 2014-03-04 LAB — HEMOGLOBIN A1C: HEMOGLOBIN A1C: 7.5 % — AB (ref 4.6–6.5)

## 2014-03-04 MED ORDER — INSULIN ASPART 100 UNIT/ML ~~LOC~~ SOLN: SUBCUTANEOUS | Status: DC

## 2014-03-04 MED ORDER — CEFUROXIME AXETIL 250 MG PO TABS: 250.0000 mg | ORAL_TABLET | Freq: Two times a day (BID) | ORAL | Status: AC

## 2014-03-04 NOTE — Patient Instructions (Addendum)
i have sent a prescription to your pharmacy, for an antibiotic pill.   Please continue the same flonase.   Change zyrtec to zyrtec-D, until you get better.   Please see an allergy specialist.  you will receive a phone call, about a day and time for an appointment.

## 2014-03-04 NOTE — Progress Notes (Signed)
Subjective:    Patient ID: Molly [name], female    DOB: Feb 06, 1970, 44 y.o.   MRN: [id number]  HPI Pt returns for f/u of diabetes mellitus: DM type: 1 Dx'ed: 1983 Complications: none Therapy: insulin since dx DKA: last episode was in 2011 Severe hypoglycemia: last episode was many years ago.  Pancreatitis: never Other: she has a paradigm insulin pump; she declines continuous glucose monitor, due to cost Interval history: she brings a record of her cbg's which i have reviewed today.  It varies from 53-200, but most are in the 100's.  She has mild hypoglycemia approx twice per month.  There is no trend throughout the day. She takes a total of approx 70 units per day.  Pt states 6 weeks of nasal congestion, and assoc left maxillary pain.  Past Medical History  Diagnosis Date  . SMOKER 08/11/2009  . CONTUSION, ANKLE 08/28/2007  . SEBACEOUS CYST, INFECTED 07/13/2007  . TINEA VERSICOLOR 07/13/2007  . ALLERGIC RHINITIS CAUSE UNSPECIFIED 03/06/2007  . MIGRAINE, CHRONIC 03/06/2007  . DIABETES MELLITUS, TYPE I 08/03/2006  . DEPRESSION 08/03/2006  . THROMBOCYTOPENIA 05/07/2008  . Encounter for long-term (current) use of other medications   . DM retinopathy   . Bipolar 1 disorder     Past Surgical History  Procedure Laterality Date  . Abnormal u/s  12/10/1996  . Electrocardiogram  06/13/2006  . Tubal ligation      History   Social History  . Marital Status: Widowed    Spouse Name: N/A  . Number of Children: N/A  . Years of Education: N/A   Occupational History  . Not on file.   Social History Main Topics  . Smoking status: Former Smoker    Quit date: 10/16/2010  . Smokeless tobacco: Never Used  . Alcohol Use: No  . Drug Use: No  . Sexual Activity: Yes     Comment: tubal ligation   Other Topics Concern  . Not on file   Social History Narrative   Does not work outside the home    Current Outpatient Prescriptions on File Prior to Visit  Medication Sig Dispense Refill  .  Alcohol Swabs (ALCOHOL PREP) 70 % PADS 8/day  250.01 800 each 3  . alendronate (FOSAMAX) 70 MG tablet TAKE 1 TABLET BY MOUTH EVERY 7 DAYS. TAKE WITH FULL GLASS OF WATER ON AN EMPTY STOMACH 4 tablet 3  . carbamazepine (TEGRETOL XR) 200 MG 12 hr tablet Take 2 by mouth every morning daily     . fluticasone (FLONASE) 50 MCG/ACT nasal spray Place into both nostrils daily.    Marland Kitchen glucose blood (FREESTYLE LITE) test strip 8/day dx 250.01, and lancets 800 each 3  . insulin aspart (NOVOLOG FLEXPEN) 100 UNIT/ML FlexPen Inject 10 Units into the skin 3 (three) times daily with meals. And pen needles 4/day 15 mL 11  . Insulin Glargine (LANTUS SOLOSTAR) 100 UNIT/ML Solostar Pen Inject 35 Units into the skin daily at 10 pm. 5 pen PRN  . Insulin Infusion Pump Supplies (MINIMED INFUSION SET-MMT 398) MISC Inject 1 Device into the skin 3 days. Change every 3 days 30 each 3  . Insulin Infusion Pump Supplies (PARADIGM RESERVOIR ) MISC Inject 1 Device into the skin 3 days. Change every 3 days 30 each 3  . lamoTRIgine (LAMICTAL) 200 MG tablet Take 200 mg by mouth 2 (two) times daily.     . Lancets (FREESTYLE) lancets Use as instructed dx 250.01 800 each 3  . mupirocin ointment (  BACTROBAN) 2 % Apply 1 application topically 3 (three) times daily. As needed 30 g 3  . nystatin-triamcinolone (MYCOLOG II) cream Apply 1 application topically 3 (three) times daily. 30 g 2  . rosuvastatin (CRESTOR) 40 MG tablet Take 1 tablet (40 mg total) by mouth daily. 1 tablet at bedtime 90 tablet 3  . Transparent Dressings (OPSITE IV 3000) MISC Inject 1 Device into the skin 3 days. 30 each 3  . Transparent Dressings (OPSITE IV 3000) MISC Inject 1 Device into the skin 3 days. Change every 3 days 30 each 3  . ziprasidone (GEODON) 60 MG capsule Take 2 by mouth at bedtime      No current facility-administered medications on file prior to visit.    Allergies  Allergen Reactions  . Varenicline Tartrate     REACTION: rash    Family History   Problem Relation Age of Onset  . Diabetes Father     (Oral Agents)  . Diabetes Paternal Grandmother   . Hypertension Paternal Grandmother   . Diabetes Paternal Grandfather   . Cancer Paternal Grandfather     colon  . Hypertension Paternal Grandfather     BP 118/62 mmHg  Pulse 68  Temp(Src) 98 F (36.7 C) (Oral)  Ht 5\' 6"  (1.676 m)  Wt 171 lb (77.565 kg)  BMI 27.61 kg/m2  SpO2 98%   Review of Systems Denies fever, but she has slight left earache.  No cough.      Objective:   Physical Exam VITAL SIGNS:  See vs page GENERAL: no distress head: no deformity eyes: no periorbital swelling, no proptosis external nose and ears are normal mouth: no lesion seen The left tm is red.  The right is normal.     Lab Results  Component Value Date   HGBA1C 7.5* 03/04/2014       Assessment & Plan:  URI: new Allergic rhinitis: is probably contributing to sxs.  DM: mild exacerbation: continuous glucose monitor would be needed to safely reduce a1c to 7%   Patient is advised the following: Patient Instructions  i have sent a prescription to your pharmacy, for an antibiotic pill.   Please continue the same flonase.   Change zyrtec to zyrtec-D, until you get better.   Please see an allergy specialist.  you will receive a phone call, about a day and time for an appointment.

## 2014-03-18 ENCOUNTER — Telehealth: Payer: Self-pay | Admitting: Endocrinology

## 2014-03-18 ENCOUNTER — Ambulatory Visit (INDEPENDENT_AMBULATORY_CARE_PROVIDER_SITE_OTHER): Payer: BLUE CROSS/BLUE SHIELD | Admitting: Endocrinology

## 2014-03-18 ENCOUNTER — Encounter: Payer: Self-pay | Admitting: Endocrinology

## 2014-03-18 VITALS — BP 130/64 | HR 69 | Temp 98.2°F | Ht 66.0 in

## 2014-03-18 DIAGNOSIS — G43809 Other migraine, not intractable, without status migrainosus: Secondary | ICD-10-CM | POA: Diagnosis not present

## 2014-03-18 MED ORDER — SUMATRIPTAN SUCCINATE 100 MG PO TABS: 100.0000 mg | ORAL_TABLET | ORAL | Status: DC | PRN

## 2014-03-18 MED ORDER — ONDANSETRON HCL 4 MG PO TABS: 4.0000 mg | ORAL_TABLET | Freq: Three times a day (TID) | ORAL | Status: DC | PRN

## 2014-03-18 MED ORDER — SUMATRIPTAN SUCCINATE 100 MG PO TABS: ORAL_TABLET | ORAL | Status: DC

## 2014-03-18 NOTE — Patient Instructions (Addendum)
i have sent 2 prescriptions to your pharmacy. I hope you feel better soon.  If you don't feel better by next week, please call back, so i can refer you to a specialist.

## 2014-03-18 NOTE — Telephone Encounter (Signed)
Pt called and said her script for imatrex did not go thru to [facility] can you please resend

## 2014-03-18 NOTE — Telephone Encounter (Signed)
Rx sent to [facility]

## 2014-03-18 NOTE — Progress Notes (Signed)
Subjective:    Patient ID: Molly [name], female    DOB: [birth year], 44 y.o.   MRN: [id number]  HPI Pt states 2-3 weeks of recurrence of headache.  She last had sxs 12 years ago.  Pain is moderate, and starts at the left occipital area.  It then becomes generalized.  She gets assoc nausea. She is unable to cite precip factor.  No help with NSAID.  Pt says these sxs are similar to migraine she had in the past.   Past Medical History  Diagnosis Date  . SMOKER 08/11/2009  . CONTUSION, ANKLE 08/28/2007  . SEBACEOUS CYST, INFECTED 07/13/2007  . TINEA VERSICOLOR 07/13/2007  . ALLERGIC RHINITIS CAUSE UNSPECIFIED 03/06/2007  . MIGRAINE, CHRONIC 03/06/2007  . DIABETES MELLITUS, TYPE I 08/03/2006  . DEPRESSION 08/03/2006  . THROMBOCYTOPENIA 05/07/2008  . Encounter for long-term (current) use of other medications   . DM retinopathy   . Bipolar 1 disorder     Past Surgical History  Procedure Laterality Date  . Abnormal u/s  12/10/1996  . Electrocardiogram  06/13/2006  . Tubal ligation      History   Social History  . Marital Status: Widowed    Spouse Name: N/A  . Number of Children: N/A  . Years of Education: N/A   Occupational History  . Not on file.   Social History Main Topics  . Smoking status: Former Smoker    Quit date: 10/16/2010  . Smokeless tobacco: Never Used  . Alcohol Use: No  . Drug Use: No  . Sexual Activity: Yes     Comment: tubal ligation   Other Topics Concern  . Not on file   Social History Narrative   Does not work outside the home    Current Outpatient Prescriptions on File Prior to Visit  Medication Sig Dispense Refill  . Alcohol Swabs (ALCOHOL PREP) 70 % PADS 8/day  250.01 800 each 3  . alendronate (FOSAMAX) 70 MG tablet TAKE 1 TABLET BY MOUTH EVERY 7 DAYS. TAKE WITH FULL GLASS OF WATER ON AN EMPTY STOMACH 4 tablet 3  . carbamazepine (TEGRETOL XR) 200 MG 12 hr tablet Take 2 by mouth every morning daily     . fluticasone (FLONASE) 50 MCG/ACT nasal spray  Place into both nostrils daily.    Marland Kitchen glucose blood (FREESTYLE LITE) test strip 8/day dx 250.01, and lancets 800 each 3  . insulin aspart (NOVOLOG FLEXPEN) 100 UNIT/ML FlexPen Inject 10 Units into the skin 3 (three) times daily with meals. And pen needles 4/day 15 mL 11  . insulin aspart (NOVOLOG) 100 UNIT/ML injection For use in pump, total 70 units/day 7 vial 3  . Insulin Glargine (LANTUS SOLOSTAR) 100 UNIT/ML Solostar Pen Inject 35 Units into the skin daily at 10 pm. 5 pen PRN  . Insulin Infusion Pump Supplies (MINIMED INFUSION SET-MMT 398) MISC Inject 1 Device into the skin 3 days. Change every 3 days 30 each 3  . Insulin Infusion Pump Supplies (PARADIGM RESERVOIR ) MISC Inject 1 Device into the skin 3 days. Change every 3 days 30 each 3  . lamoTRIgine (LAMICTAL) 200 MG tablet Take 200 mg by mouth 2 (two) times daily.     . Lancets (FREESTYLE) lancets Use as instructed dx 250.01 800 each 3  . mupirocin ointment (BACTROBAN) 2 % Apply 1 application topically 3 (three) times daily. As needed 30 g 3  . nystatin-triamcinolone (MYCOLOG II) cream Apply 1 application topically 3 (three) times daily. 30 g 2  .  rosuvastatin (CRESTOR) 40 MG tablet Take 1 tablet (40 mg total) by mouth daily. 1 tablet at bedtime 90 tablet 3  . Transparent Dressings (OPSITE IV 3000) MISC Inject 1 Device into the skin 3 days. 30 each 3  . Transparent Dressings (OPSITE IV 3000) MISC Inject 1 Device into the skin 3 days. Change every 3 days 30 each 3  . ziprasidone (GEODON) 60 MG capsule Take 2 by mouth at bedtime      No current facility-administered medications on file prior to visit.    Allergies  Allergen Reactions  . Varenicline Tartrate     REACTION: rash    Family History  Problem Relation Age of Onset  . Diabetes Father     (Oral Agents)  . Diabetes Paternal Grandmother   . Hypertension Paternal Grandmother   . Diabetes Paternal Grandfather   . Cancer Paternal Grandfather     colon  . Hypertension  Paternal Grandfather     BP 130/64 mmHg  Pulse 69  Temp(Src) 98.2 F (36.8 C) (Oral)  Ht 5\' 6"  (1.676 m)  SpO2 98%  Review of Systems No visual loss or LOC    Objective:   Physical Exam VITAL SIGNS:  See vs page GENERAL: no distress head: no deformity.   eyes: no periorbital swelling, no proptosis.   external nose and ears are normal.   mouth: no lesion seen.  Both eac's and tm's are normal.   Neck: supple.   CN 2-12 are normal.        Assessment & Plan:  Headache, recurrent, prob due to migraine.    Patient is advised the following: Patient Instructions  i have sent 2 prescriptions to your pharmacy. I hope you feel better soon.  If you don't feel better by next week, please call back, so i can refer you to a specialist.

## 2014-04-06 ENCOUNTER — Inpatient Hospital Stay (HOSPITAL_COMMUNITY)
Admission: EM | Admit: 2014-04-06 | Discharge: 2014-04-08 | DRG: 639 | Disposition: A | Discharge status: Home / Self Care | Payer: BLUE CROSS/BLUE SHIELD | Attending: Internal Medicine | Admitting: Internal Medicine

## 2014-04-06 ENCOUNTER — Encounter (HOSPITAL_COMMUNITY): Payer: Self-pay

## 2014-04-06 DIAGNOSIS — R509 Fever, unspecified: Secondary | ICD-10-CM | POA: Diagnosis not present

## 2014-04-06 DIAGNOSIS — F329 Major depressive disorder, single episode, unspecified: Secondary | ICD-10-CM | POA: Diagnosis present

## 2014-04-06 DIAGNOSIS — Z888 Allergy status to other drugs, medicaments and biological substances status: Secondary | ICD-10-CM

## 2014-04-06 DIAGNOSIS — Z7901 Long term (current) use of anticoagulants: Secondary | ICD-10-CM

## 2014-04-06 DIAGNOSIS — E109 Type 1 diabetes mellitus without complications: Secondary | ICD-10-CM | POA: Diagnosis present

## 2014-04-06 DIAGNOSIS — E10319 Type 1 diabetes mellitus with unspecified diabetic retinopathy without macular edema: Secondary | ICD-10-CM | POA: Diagnosis present

## 2014-04-06 DIAGNOSIS — Z9641 Presence of insulin pump (external) (internal): Secondary | ICD-10-CM | POA: Diagnosis present

## 2014-04-06 DIAGNOSIS — R112 Nausea with vomiting, unspecified: Secondary | ICD-10-CM | POA: Diagnosis present

## 2014-04-06 DIAGNOSIS — G43909 Migraine, unspecified, not intractable, without status migrainosus: Secondary | ICD-10-CM | POA: Diagnosis present

## 2014-04-06 DIAGNOSIS — F319 Bipolar disorder, unspecified: Secondary | ICD-10-CM

## 2014-04-06 DIAGNOSIS — F32A Depression, unspecified: Secondary | ICD-10-CM | POA: Diagnosis present

## 2014-04-06 DIAGNOSIS — E1010 Type 1 diabetes mellitus with ketoacidosis without coma: Secondary | ICD-10-CM | POA: Diagnosis present

## 2014-04-06 DIAGNOSIS — J302 Other seasonal allergic rhinitis: Secondary | ICD-10-CM | POA: Diagnosis present

## 2014-04-06 DIAGNOSIS — J309 Allergic rhinitis, unspecified: Secondary | ICD-10-CM | POA: Diagnosis present

## 2014-04-06 DIAGNOSIS — M810 Age-related osteoporosis without current pathological fracture: Secondary | ICD-10-CM | POA: Diagnosis present

## 2014-04-06 DIAGNOSIS — F17210 Nicotine dependence, cigarettes, uncomplicated: Secondary | ICD-10-CM | POA: Diagnosis present

## 2014-04-06 DIAGNOSIS — Z794 Long term (current) use of insulin: Secondary | ICD-10-CM

## 2014-04-06 DIAGNOSIS — Z720 Tobacco use: Secondary | ICD-10-CM | POA: Diagnosis present

## 2014-04-06 DIAGNOSIS — E1110 Type 2 diabetes mellitus with ketoacidosis without coma: Secondary | ICD-10-CM

## 2014-04-06 DIAGNOSIS — D649 Anemia, unspecified: Secondary | ICD-10-CM | POA: Diagnosis not present

## 2014-04-06 DIAGNOSIS — E785 Hyperlipidemia, unspecified: Secondary | ICD-10-CM | POA: Diagnosis present

## 2014-04-06 DIAGNOSIS — D696 Thrombocytopenia, unspecified: Secondary | ICD-10-CM | POA: Diagnosis present

## 2014-04-06 DIAGNOSIS — J3089 Other allergic rhinitis: Secondary | ICD-10-CM

## 2014-04-06 HISTORY — DX: Hyperlipidemia, unspecified: E78.5

## 2014-04-06 HISTORY — DX: Age-related osteoporosis without current pathological fracture: M81.0

## 2014-04-06 LAB — COMPREHENSIVE METABOLIC PANEL
ALT: 30 U/L (ref 0–35)
AST: 40 U/L — ABNORMAL HIGH (ref 0–37)
Albumin: 4.6 g/dL (ref 3.5–5.2)
Alkaline Phosphatase: 53 U/L (ref 39–117)
Anion gap: 18 — ABNORMAL HIGH (ref 5–15)
BUN: 16 mg/dL (ref 6–23)
CO2: 18 mmol/L — ABNORMAL LOW (ref 19–32)
Calcium: 9.9 mg/dL (ref 8.4–10.5)
Chloride: 101 mmol/L (ref 96–112)
Creatinine, Ser: 0.99 mg/dL (ref 0.50–1.10)
GFR calc Af Amer: 79 mL/min — ABNORMAL LOW (ref >90)
GFR calc non Af Amer: 68 mL/min — ABNORMAL LOW (ref >90)
GLUCOSE: 433 mg/dL — AB (ref 70–99)
Potassium: 4.5 mmol/L (ref 3.5–5.1)
Sodium: 137 mmol/L (ref 135–145)
TOTAL PROTEIN: 7 g/dL (ref 6.0–8.3)
Total Bilirubin: 1.2 mg/dL (ref 0.3–1.2)

## 2014-04-06 LAB — CBC
HCT: 43.5 % (ref 36.0–46.0)
Hemoglobin: 14.4 g/dL (ref 12.0–15.0)
MCH: 31.7 pg (ref 26.0–34.0)
MCHC: 33.1 g/dL (ref 30.0–36.0)
MCV: 95.8 fL (ref 78.0–100.0)
Platelets: 156 10*3/uL (ref 150–400)
RBC: 4.54 MIL/uL (ref 3.87–5.11)
RDW: 12.5 % (ref 11.5–15.5)
WBC: 12.9 10*3/uL — ABNORMAL HIGH (ref 4.0–10.5)

## 2014-04-06 LAB — CBG MONITORING, ED
GLUCOSE-CAPILLARY: 357 mg/dL — AB (ref 70–99)
Glucose-Capillary: 378 mg/dL — ABNORMAL HIGH (ref 70–99)

## 2014-04-06 MED ORDER — DIPHENHYDRAMINE HCL 50 MG/ML IJ SOLN
25.0000 mg | Freq: Once | INTRAMUSCULAR | Status: AC
  Administered 2014-04-06: 25 mg via INTRAVENOUS
  Filled 2014-04-06: qty 1

## 2014-04-06 MED ORDER — METOCLOPRAMIDE HCL 5 MG/ML IJ SOLN
10.0000 mg | Freq: Once | INTRAMUSCULAR | Status: AC
  Administered 2014-04-06: 10 mg via INTRAMUSCULAR
  Filled 2014-04-06: qty 2

## 2014-04-06 MED ORDER — SODIUM CHLORIDE 0.9 % IV BOLUS (SEPSIS)
2000.0000 mL | Freq: Once | INTRAVENOUS | Status: AC
  Administered 2014-04-07: 2000 mL via INTRAVENOUS

## 2014-04-06 MED ORDER — SODIUM CHLORIDE 0.9 % IV BOLUS (SEPSIS)
1000.0000 mL | Freq: Once | INTRAVENOUS | Status: AC
Start: 2014-04-06 — End: 2014-04-07
  Administered 2014-04-06: 1000 mL via INTRAVENOUS

## 2014-04-06 MED ORDER — ONDANSETRON 8 MG PO TBDP
8.0000 mg | ORAL_TABLET | Freq: Once | ORAL | Status: AC
  Administered 2014-04-06: 8 mg via ORAL
  Filled 2014-04-06: qty 1

## 2014-04-06 NOTE — ED Notes (Signed)
Pt noticed her blood sugar was up earlier today; noticed her insulin pump wasn't working tonight.

## 2014-04-06 NOTE — H&P (Addendum)
[facility] History and Physical  Molly [name] ZOX:[id number] DOB: 1970-10-21 DOA: 04/06/2014  Referring physician: ED physician PCP: Romero Belling, MD  Specialists:   Chief Complaint: Nausea, vomiting and high blood sugar  HPI: Molly [name] is a 44 y.o. female with past medical history of diabetes mellitus type 1 on insulin pump, hyperlipidemia, depression, bipolar disorder, migraine headache, tobacco abuse, who presents with nausea, vomiting and high blood sugar.  Patient reports that yesterday she developed nausea and vomiting. She vomited many times without blood in the vomitus. She feels very dry. She does not have abdominal pain or diarrhea. Her blood sugar was elevated at 237 in the afternoon. She tried to treat herself with insulin pump once, but her CBG was still elevated. She then realized that her insulin pump had mechanical problem. Patient denies fever, chills, runny nose, sore throat, headaches, cough, chest pain, SOB, abdominal pain, diarrhea, constipation, dysuria, urgency, frequency, hematuria, skin rashes or leg swelling. No unilateral weakness, numbness or tingling sensations. No vision change or hearing loss.  In ED, patient was found to have elevated anion gap at18, bicarbonate 18, leukocytosis with WBC 12.9, potassium 4.5. Patient is admitted to inpatient for further evaluation and treatment.  Review of Systems: As presented in the history of presenting illness, rest negative.  Where does patient live?  At home Can patient participate in ADLs? Yes  Allergy:  Allergies  Allergen Reactions  . Varenicline Tartrate     REACTION: rash    Past Medical History  Diagnosis Date  . SMOKER 08/11/2009  . CONTUSION, ANKLE 08/28/2007  . SEBACEOUS CYST, INFECTED 07/13/2007  . TINEA VERSICOLOR 07/13/2007  . ALLERGIC RHINITIS CAUSE UNSPECIFIED 03/06/2007  . MIGRAINE, CHRONIC 03/06/2007  . DIABETES MELLITUS, TYPE I 08/03/2006  . DEPRESSION 08/03/2006  . THROMBOCYTOPENIA  05/07/2008  . Encounter for long-term (current) use of other medications   . DM retinopathy   . Bipolar 1 disorder     Past Surgical History  Procedure Laterality Date  . Abnormal u/s  12/10/1996  . Electrocardiogram  06/13/2006  . Tubal ligation      Social History:  reports that she quit smoking about 3 years ago. She has never used smokeless tobacco. She reports that she does not drink alcohol or use illicit drugs.  Family History:  Family History  Problem Relation Age of Onset  . Diabetes Father     (Oral Agents)  . Diabetes Paternal Grandmother   . Hypertension Paternal Grandmother   . Diabetes Paternal Grandfather   . Cancer Paternal Grandfather     colon  . Hypertension Paternal Grandfather      Prior to Admission medications   Medication Sig Start Date End Date Taking? Authorizing Provider  alendronate (FOSAMAX) 70 MG tablet TAKE 1 TABLET BY MOUTH EVERY 7 DAYS. TAKE WITH FULL GLASS OF WATER ON AN EMPTY STOMACH 02/17/14  Yes Romero Belling, MD  carbamazepine (TEGRETOL) 200 MG tablet Take 400-600 mg by mouth 2 (two) times daily. 2 tablets in the am and 3 tablets in the pm   Yes Historical Provider, MD  cetirizine (ZYRTEC) 10 MG tablet Take 10 mg by mouth daily.   Yes Historical Provider, MD  fluticasone (FLONASE) 50 MCG/ACT nasal spray Place into both nostrils daily.   Yes Historical Provider, MD  insulin aspart (NOVOLOG) 100 UNIT/ML injection For use in pump, total 70 units/day 03/04/14  Yes Romero Belling, MD  lamoTRIgine (LAMICTAL) 200 MG tablet Take 200 mg by mouth 2 (two) times  daily.    Yes Historical Provider, MD  rosuvastatin (CRESTOR) 40 MG tablet Take 1 tablet (40 mg total) by mouth daily. 1 tablet at bedtime 01/07/14  Yes Romero Belling, MD  ziprasidone (GEODON) 60 MG capsule Take 120 mg by mouth at bedtime. Take 2 by mouth at bedtime   Yes Historical Provider, MD  Alcohol Swabs (ALCOHOL PREP) 70 % PADS 8/day  250.01 10/27/11   Romero Belling, MD  glucose blood (FREESTYLE  LITE) test strip 8/day dx 250.01, and lancets 08/09/12   Romero Belling, MD  insulin aspart (NOVOLOG FLEXPEN) 100 UNIT/ML FlexPen Inject 10 Units into the skin 3 (three) times daily with meals. And pen needles 4/day Patient not taking: Reported on 04/06/2014 09/24/13   Romero Belling, MD  Insulin Glargine (LANTUS SOLOSTAR) 100 UNIT/ML Solostar Pen Inject 35 Units into the skin daily at 10 pm. 09/24/13   Romero Belling, MD  Insulin Infusion Pump Supplies (MINIMED INFUSION SET-MMT 398) MISC Inject 1 Device into the skin 3 days. Change every 3 days 10/27/11   Romero Belling, MD  Insulin Infusion Pump Supplies (PARADIGM RESERVOIR ) MISC Inject 1 Device into the skin 3 days. Change every 3 days 10/27/11   Romero Belling, MD  Lancets (FREESTYLE) lancets Use as instructed dx 250.01 08/10/12   Romero Belling, MD  mupirocin ointment (BACTROBAN) 2 % Apply 1 application topically 3 (three) times daily. As needed Patient not taking: Reported on 04/06/2014 10/22/13   Romero Belling, MD  nystatin-triamcinolone [facility] II) cream Apply 1 application topically 3 (three) times daily. Patient not taking: Reported on 04/06/2014 06/13/13   Ok Edwards, MD  ondansetron (ZOFRAN) 4 MG tablet Take 1 tablet (4 mg total) by mouth every 8 (eight) hours as needed for nausea or vomiting. Patient not taking: Reported on 04/06/2014 03/18/14   Romero Belling, MD  SUMAtriptan (IMITREX) 100 MG tablet Take 1 tablet by mouth every 2 hrs as needed for headaches. May repeat in 2 hours if headache persists or recurs.  Not to exceed 2/day 03/18/14   Romero Belling, MD  Transparent Dressings (OPSITE IV 3000) MISC Inject 1 Device into the skin 3 days. 04/26/10   Romero Belling, MD  Transparent Dressings (OPSITE IV 3000) MISC Inject 1 Device into the skin 3 days. Change every 3 days 02/28/11   Romero Belling, MD    Physical Exam: Filed Vitals:   04/06/14 1926 04/06/14 1926 04/06/14 2249 04/07/14 0002  BP: 127/52 127/52 141/52 136/46  Pulse: 84 84 95 104  Temp: 97.7 F  (36.5 C) 97.7 F (36.5 C)    TempSrc: Oral Oral    Resp: SpO2: 99% 100% 100% 98%   General: Not in acute distress. Dry mucus and the membrane HEENT:       Eyes: PERRL, EOMI, no scleral icterus       ENT: No discharge from the ears and nose, no pharynx injection, no tonsillar enlargement.        Neck: No JVD, no bruit, no mass felt. Cardiac: S1/S2, RRR, 1/6 systolic murmurs (not new), No gallops or rubs Pulm: Good air movement bilaterally. Clear to auscultation bilaterally. No rales, wheezing, rhonchi or rubs. Abd: Soft, nondistended, nontender, no rebound pain, no organomegaly, BS present Ext: No edema bilaterally. 2+DP/PT pulse bilaterally Musculoskeletal: No joint deformities, erythema, or stiffness, ROM full Skin: No rashes.  Neuro: Alert and oriented X3, cranial nerves II-XII grossly intact, muscle strength 5/5 in all extremeties, sensation to light touch intact.  Psych: Patient is not psychotic, no suicidal or hemocidal ideation.  Labs on Admission:  Basic Metabolic Panel:  Recent Labs Lab 04/06/14 1942  NA 137  K 4.5  CL 101  CO2 18*  GLUCOSE 433*  BUN 16  CREATININE 0.99  CALCIUM 9.9   Liver Function Tests:  Recent Labs Lab 04/06/14 1942  AST 40*  ALT 30  ALKPHOS 53  BILITOT 1.2  PROT 7.0  ALBUMIN 4.6   No results for input(s): LIPASE, AMYLASE in the last 168 hours. No results for input(s): AMMONIA in the last 168 hours. CBC:  Recent Labs Lab 04/06/14 1942  WBC 12.9*  HGB 14.4  HCT 43.5  MCV 95.8  PLT 156   Cardiac Enzymes: No results for input(s): CKTOTAL, CKMB, CKMBINDEX, TROPONINI in the last 168 hours.  BNP (last 3 results) No results for input(s): BNP in the last 8760 hours.  ProBNP (last 3 results) No results for input(s): PROBNP in the last 8760 hours.  CBG:  Recent Labs Lab 04/06/14 1931 04/06/14 2137  GLUCAP 357* 378*    Radiological Exams on Admission: No results found.  EKG: Independently reviewed.    Assessment/Plan Principal Problem:   DKA (diabetic ketoacidoses) Active Problems:   Type 1 diabetes mellitus   Depression   Migraine   Allergic rhinitis   Osteoporosis   Bipolar 1 disorder   Nausea & vomiting   HLD (hyperlipidemia)   Tobacco abuse  DKA: Mild DKA with anion gap 18, bicarbonate 18. Potassium 4.5 on admission. It is most likely due to failure of insulin pump. No signs of infection.  - Admit to stepdown  - Received 3L of NS bolus in ED - start DKA protocol with BMP q2h - IVF: NS 150 cc/h; will switch to D5-1/2NS when CBG<250 - replete K as needed - Zofran prn nausea  - blood culture - NPO  DM-I: A1c was 7.5 on 03/04/14. Patient was on insulin pump and Lantus 35 units daily at home. Now presents with DKA. -Hold home insulin -DKA protocol as above  Depression and bipolar 1 disorder: Stable. No suicidal or homicidal ideations. -Continue home medications: Tegretol, Lamictal, Geodon  Migraine headaches: Stable.  -When necessary Imitrex  Hyperlipidemia: LDL was 83 on 10/05/14.  -Continue Crestor  Tobacco abuse: -Consult patient about importance of quitting smoking -Nicotine patch   DVT ppx: SQ Heparin     Code Status: Full code Family Communication: None at bed side.       Disposition Plan: Admit to inpatient   Date of Service 04/07/2014    Lorretta HarpIU, Akaylah Lalley [facility] Pager [phone]  If 7PM-7AM, please contact night-coverage [email or web address] Password [email or web address] 04/07/2014, 1:04 AM

## 2014-04-06 NOTE — ED Notes (Signed)
Patient arrived to ED via EMS with complaints of high blood sugar.

## 2014-04-07 ENCOUNTER — Encounter (HOSPITAL_COMMUNITY): Payer: Self-pay | Admitting: Internal Medicine

## 2014-04-07 DIAGNOSIS — R509 Fever, unspecified: Secondary | ICD-10-CM | POA: Diagnosis present

## 2014-04-07 DIAGNOSIS — F329 Major depressive disorder, single episode, unspecified: Secondary | ICD-10-CM

## 2014-04-07 DIAGNOSIS — Z720 Tobacco use: Secondary | ICD-10-CM | POA: Diagnosis present

## 2014-04-07 DIAGNOSIS — F319 Bipolar disorder, unspecified: Secondary | ICD-10-CM

## 2014-04-07 DIAGNOSIS — E785 Hyperlipidemia, unspecified: Secondary | ICD-10-CM | POA: Diagnosis present

## 2014-04-07 DIAGNOSIS — E1010 Type 1 diabetes mellitus with ketoacidosis without coma: Principal | ICD-10-CM

## 2014-04-07 LAB — URINALYSIS, ROUTINE W REFLEX MICROSCOPIC
BILIRUBIN URINE: NEGATIVE
Hgb urine dipstick: NEGATIVE
Leukocytes, UA: NEGATIVE
Nitrite: NEGATIVE
PH: 5 (ref 5.0–8.0)
Protein, ur: NEGATIVE mg/dL
Specific Gravity, Urine: 1.029 (ref 1.005–1.030)
Urobilinogen, UA: 0.2 mg/dL (ref 0.0–1.0)

## 2014-04-07 LAB — BASIC METABOLIC PANEL
ANION GAP: 2 — AB (ref 5–15)
Anion gap: 15 (ref 5–15)
Anion gap: 5 (ref 5–15)
Anion gap: 7 (ref 5–15)
BUN: 18 mg/dL (ref 6–23)
BUN: 19 mg/dL (ref 6–23)
BUN: 19 mg/dL (ref 6–23)
BUN: 20 mg/dL (ref 6–23)
CALCIUM: 7.8 mg/dL — AB (ref 8.4–10.5)
CO2: 16 mmol/L — ABNORMAL LOW (ref 19–32)
CO2: 21 mmol/L (ref 19–32)
CO2: 20 mmol/L (ref 19–32)
CO2: 21 mmol/L (ref 19–32)
CREATININE: 0.73 mg/dL (ref 0.50–1.10)
Calcium: 8.6 mg/dL (ref 8.4–10.5)
Calcium: 8.2 mg/dL — ABNORMAL LOW (ref 8.4–10.5)
Calcium: 8.3 mg/dL — ABNORMAL LOW (ref 8.4–10.5)
Chloride: 112 mmol/L (ref 96–112)
Chloride: 115 mmol/L — ABNORMAL HIGH (ref 96–112)
Chloride: 114 mmol/L — ABNORMAL HIGH (ref 96–112)
Chloride: 117 mmol/L — ABNORMAL HIGH (ref 96–112)
Creatinine, Ser: 0.95 mg/dL (ref 0.50–1.10)
Creatinine, Ser: 0.78 mg/dL (ref 0.50–1.10)
Creatinine, Ser: 0.68 mg/dL (ref 0.50–1.10)
GFR calc Af Amer: 83 mL/min — ABNORMAL LOW (ref >90)
GFR calc Af Amer: >90 mL/min (ref >90)
GFR calc non Af Amer: 72 mL/min — ABNORMAL LOW (ref >90)
GLUCOSE: 392 mg/dL — AB (ref 70–99)
GLUCOSE: 193 mg/dL — AB (ref 70–99)
Glucose, Bld: 271 mg/dL — ABNORMAL HIGH (ref 70–99)
Glucose, Bld: 236 mg/dL — ABNORMAL HIGH (ref 70–99)
POTASSIUM: 5 mmol/L (ref 3.5–5.1)
Potassium: 4 mmol/L (ref 3.5–5.1)
Potassium: 4.1 mmol/L (ref 3.5–5.1)
Potassium: 3.9 mmol/L (ref 3.5–5.1)
SODIUM: 143 mmol/L (ref 135–145)
SODIUM: 141 mmol/L (ref 135–145)
Sodium: 141 mmol/L (ref 135–145)
Sodium: 140 mmol/L (ref 135–145)

## 2014-04-07 LAB — PREGNANCY, URINE: Preg Test, Ur: NEGATIVE

## 2014-04-07 LAB — MAGNESIUM: Magnesium: 1.7 mg/dL (ref 1.5–2.5)

## 2014-04-07 LAB — APTT: APTT: 28 s (ref 24–37)

## 2014-04-07 LAB — URINE MICROSCOPIC-ADD ON

## 2014-04-07 LAB — LACTIC ACID, PLASMA
Lactic Acid, Venous: 1.6 mmol/L (ref 0.5–2.0)
Lactic Acid, Venous: 1.4 mmol/L (ref 0.5–2.0)

## 2014-04-07 LAB — GLUCOSE, CAPILLARY
GLUCOSE-CAPILLARY: 298 mg/dL — AB (ref 70–99)
GLUCOSE-CAPILLARY: 262 mg/dL — AB (ref 70–99)
GLUCOSE-CAPILLARY: 235 mg/dL — AB (ref 70–99)
GLUCOSE-CAPILLARY: 155 mg/dL — AB (ref 70–99)
GLUCOSE-CAPILLARY: 110 mg/dL — AB (ref 70–99)
GLUCOSE-CAPILLARY: 113 mg/dL — AB (ref 70–99)
GLUCOSE-CAPILLARY: 148 mg/dL — AB (ref 70–99)
GLUCOSE-CAPILLARY: 172 mg/dL — AB (ref 70–99)
Glucose-Capillary: 356 mg/dL — ABNORMAL HIGH (ref 70–99)
Glucose-Capillary: 223 mg/dL — ABNORMAL HIGH (ref 70–99)
Glucose-Capillary: 219 mg/dL — ABNORMAL HIGH (ref 70–99)
Glucose-Capillary: 212 mg/dL — ABNORMAL HIGH (ref 70–99)
Glucose-Capillary: 182 mg/dL — ABNORMAL HIGH (ref 70–99)
Glucose-Capillary: 81 mg/dL (ref 70–99)
Glucose-Capillary: 93 mg/dL (ref 70–99)
Glucose-Capillary: 186 mg/dL — ABNORMAL HIGH (ref 70–99)

## 2014-04-07 LAB — RAPID URINE DRUG SCREEN, HOSP PERFORMED
Amphetamines: NOT DETECTED
Barbiturates: NOT DETECTED
Benzodiazepines: NOT DETECTED
COCAINE: NOT DETECTED
Opiates: NOT DETECTED
TETRAHYDROCANNABINOL: NOT DETECTED

## 2014-04-07 LAB — PROTIME-INR
INR: 1.26 (ref 0.00–1.49)
Prothrombin Time: 16 seconds — ABNORMAL HIGH (ref 11.6–15.2)

## 2014-04-07 LAB — PROCALCITONIN: Procalcitonin: 6.63 ng/mL

## 2014-04-07 LAB — MRSA PCR SCREENING: MRSA by PCR: NEGATIVE

## 2014-04-07 MED ORDER — INSULIN GLARGINE 100 UNIT/ML ~~LOC~~ SOLN
35.0000 [IU] | Freq: Every day | SUBCUTANEOUS | Status: DC
  Filled 2014-04-07: qty 0.35

## 2014-04-07 MED ORDER — INSULIN GLARGINE 100 UNIT/ML ~~LOC~~ SOLN
25.0000 [IU] | Freq: Every day | SUBCUTANEOUS | Status: DC
  Administered 2014-04-07 – 2014-04-08 (×2): 25 [IU] via SUBCUTANEOUS
  Filled 2014-04-07 (×3): qty 0.25

## 2014-04-07 MED ORDER — SODIUM CHLORIDE 0.9 % IV BOLUS (SEPSIS)
2000.0000 mL | Freq: Once | INTRAVENOUS | Status: AC
Start: 2014-04-07 — End: 2014-04-07
  Administered 2014-04-07: 2000 mL via INTRAVENOUS

## 2014-04-07 MED ORDER — CARBAMAZEPINE 200 MG PO TABS: 400.0000 mg | ORAL_TABLET | Freq: Two times a day (BID) | ORAL | Status: DC

## 2014-04-07 MED ORDER — ACETAMINOPHEN 325 MG PO TABS: 650.0000 mg | ORAL_TABLET | Freq: Four times a day (QID) | ORAL | Status: DC | PRN

## 2014-04-07 MED ORDER — SODIUM CHLORIDE 0.9 % IV SOLN
INTRAVENOUS | Status: DC
  Administered 2014-04-07: 1000 mL via INTRAVENOUS
  Administered 2014-04-07: 150 mL/h via INTRAVENOUS

## 2014-04-07 MED ORDER — INSULIN ASPART 100 UNIT/ML ~~LOC~~ SOLN
0.0000 [IU] | Freq: Three times a day (TID) | SUBCUTANEOUS | Status: DC
  Administered 2014-04-07: 2 [IU] via SUBCUTANEOUS
  Administered 2014-04-08: 3 [IU] via SUBCUTANEOUS

## 2014-04-07 MED ORDER — ROSUVASTATIN CALCIUM 10 MG PO TABS
40.0000 mg | ORAL_TABLET | Freq: Every day | ORAL | Status: DC
  Administered 2014-04-07 (×2): 40 mg via ORAL
  Filled 2014-04-07 (×4): qty 4

## 2014-04-07 MED ORDER — CARBAMAZEPINE 200 MG PO TABS
400.0000 mg | ORAL_TABLET | Freq: Every day | ORAL | Status: DC
  Administered 2014-04-07 – 2014-04-08 (×2): 400 mg via ORAL
  Filled 2014-04-07 (×2): qty 2

## 2014-04-07 MED ORDER — CARBAMAZEPINE 200 MG PO TABS
600.0000 mg | ORAL_TABLET | Freq: Every day | ORAL | Status: DC
  Administered 2014-04-07 (×2): 600 mg via ORAL
  Filled 2014-04-07 (×3): qty 3

## 2014-04-07 MED ORDER — SUMATRIPTAN SUCCINATE 100 MG PO TABS
100.0000 mg | ORAL_TABLET | ORAL | Status: DC | PRN
  Filled 2014-04-07: qty 1

## 2014-04-07 MED ORDER — FLUTICASONE PROPIONATE 50 MCG/ACT NA SUSP
1.0000 | Freq: Every day | NASAL | Status: DC
Start: 2014-04-07 — End: 2014-04-08
  Administered 2014-04-07 – 2014-04-08 (×2): 1 via NASAL
  Filled 2014-04-07: qty 16

## 2014-04-07 MED ORDER — LAMOTRIGINE 200 MG PO TABS
400.0000 mg | ORAL_TABLET | Freq: Every day | ORAL | Status: DC
  Administered 2014-04-08: 400 mg via ORAL
  Filled 2014-04-07: qty 2

## 2014-04-07 MED ORDER — HEPARIN SODIUM (PORCINE) 5000 UNIT/ML IJ SOLN
5000.0000 [IU] | Freq: Three times a day (TID) | INTRAMUSCULAR | Status: DC
Start: 2014-04-07 — End: 2014-04-08
  Administered 2014-04-07 (×2): 5000 [IU] via SUBCUTANEOUS
  Filled 2014-04-07 (×7): qty 1

## 2014-04-07 MED ORDER — DEXTROSE-NACL 5-0.45 % IV SOLN
INTRAVENOUS | Status: DC
  Administered 2014-04-07: 150 mL/h via INTRAVENOUS
  Administered 2014-04-07: 1000 mL via INTRAVENOUS

## 2014-04-07 MED ORDER — LORATADINE 10 MG PO TABS
10.0000 mg | ORAL_TABLET | Freq: Every day | ORAL | Status: DC
  Administered 2014-04-07 – 2014-04-08 (×2): 10 mg via ORAL
  Filled 2014-04-07 (×2): qty 1

## 2014-04-07 MED ORDER — ZIPRASIDONE HCL 60 MG PO CAPS
120.0000 mg | ORAL_CAPSULE | Freq: Every day | ORAL | Status: DC
  Administered 2014-04-07 (×2): 120 mg via ORAL
  Filled 2014-04-07 (×4): qty 2

## 2014-04-07 MED ORDER — POTASSIUM CHLORIDE 10 MEQ/100ML IV SOLN
10.0000 meq | INTRAVENOUS | Status: AC
  Administered 2014-04-07 (×2): 10 meq via INTRAVENOUS
  Filled 2014-04-07: qty 100

## 2014-04-07 MED ORDER — LAMOTRIGINE 200 MG PO TABS
200.0000 mg | ORAL_TABLET | Freq: Two times a day (BID) | ORAL | Status: DC
  Administered 2014-04-07: 200 mg via ORAL
  Filled 2014-04-07 (×3): qty 1

## 2014-04-07 MED ORDER — SODIUM CHLORIDE 0.9 % IV SOLN
INTRAVENOUS | Status: DC
  Administered 2014-04-07: 3 [IU]/h via INTRAVENOUS
  Filled 2014-04-07: qty 2.5

## 2014-04-07 MED ORDER — ONDANSETRON HCL 4 MG/2ML IJ SOLN
4.0000 mg | Freq: Three times a day (TID) | INTRAMUSCULAR | Status: AC | PRN
  Administered 2014-04-07: 4 mg via INTRAVENOUS

## 2014-04-07 MED ORDER — NICOTINE 21 MG/24HR TD PT24
21.0000 mg | MEDICATED_PATCH | Freq: Every day | TRANSDERMAL | Status: DC
  Filled 2014-04-07 (×2): qty 1

## 2014-04-07 MED ORDER — SODIUM CHLORIDE 0.9 % IV SOLN: INTRAVENOUS | Status: DC

## 2014-04-07 MED ORDER — MAGNESIUM SULFATE 2 GM/50ML IV SOLN
2.0000 g | Freq: Once | INTRAVENOUS | Status: AC
  Administered 2014-04-07: 2 g via INTRAVENOUS
  Filled 2014-04-07: qty 50

## 2014-04-07 NOTE — Progress Notes (Signed)
Dr Janee Mornhompson called and updated on pt and her desire for him to come and discharge her. She refused her sliding scale insulin for CBG 148. She plans to leave by 1900 and Dr. Janee Mornhompson aware. Sueo Cullen, Georga HackingSusan M, RN

## 2014-04-07 NOTE — Progress Notes (Signed)
Dr Janee Mornhompson called to inform that patient has decided to stay and not leave AMA. Bellany Elbaum, Georga HackingSusan M, RN
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
## 2014-04-07 NOTE — Progress Notes (Signed)
CARE MANAGEMENT NOTE 04/07/2014  Patient:  Molly [name],Molly [name]   Account Number:  [id number]  Date Initiated:  04/07/2014  Documentation initiated by:  Mitzie Marlar  Subjective/Objective Assessment:   dka     Action/Plan:   home when stable   Anticipated DC Date:  04/10/2014   Anticipated DC Plan:  HOME/SELF CARE  In-house referral  NA      DC Planning Services  CM consult      PAC Choice  NA   Choice offered to / List presented to:  NA           Status of service:  In process, will continue to follow Medicare Important Message given?   (If response is "NO", the following Medicare IM given date fields will be blank) Date Medicare IM given:   Medicare IM given by:   Date Additional Medicare IM given:   Additional Medicare IM given by:    Discharge Disposition:    Per UR Regulation:  Reviewed for med. necessity/level of care/duration of stay  If discussed at Long Length of Stay Meetings, dates discussed:    Comments:  April 07, 2014/Zahriyah Joo L. Earlene Plateravis, RN, BSN, CCM. Case Management [employer] [phone] No discharge needs present of time of review.

## 2014-04-07 NOTE — Progress Notes (Signed)
[facility] PROGRESS NOTE  Molly [name] [facility] QIO:[id number] DOB: 30-Aug-1970 DOA: 04/06/2014 PCP: Romero Belling, MD  Assessment/Plan: #1 DKA Questionable etiology. Patient on insulin pump and felt to have a mechanical failure. Patient spiking fevers up to 101.4. Chest x-ray was ordered however patient is refusing chest x-ray stating she "doesn't see the sense in check a CXR despite fever and presenting with nausea and emesis. " Urinalysis is negative. Blood cultures are pending. Currently on glucose stabilizer. Once 3 CBGs less than 180 will transition to subcutaneous insulin and bridge and discontinue glucose stabilizer 2 hours later. Will subsequently placed on a sliding scale insulin and then advance her diet. Consult with diabetic coordinator.  #2 diabetes mellitus type 1 Hemoglobin A1c was 7.5 on 03/04/2014. Patient supposedly was an incident pump and Lantus 35 units daily at home. Currently on DKA protocol.  #3 fevers Patient spiking a fever to a temp of 101.4. Patient presented in DKA. Urinalysis was negative. Blood cultures are pending. Chest x-ray was ordered however patient refusing chest x-ray stating she doesn't see any sensing getting a chest x-ray to investigate the fever and a such will cancel chest x-ray. Follow.  #4 depression and bipolar disorder Stable. No suicidal or homicidal ideations. Continue home regimen of Tegretol, Lamictal, Geodon.  #5 migraine headaches Stable. Imitrex as needed.  #6 hyperlipidemia Continue home dose Crestor.  #7 tobacco abuse Tobacco cessation. Nicotine patch.  #8 prophylaxis Heparin for DVT prophylaxis.  Code Status: Full Family Communication: Updated patient and family present. Disposition Plan: Remaining step down.   Consultants:  None  Procedures:  None  Antibiotics:  None  HPI/Subjective: Patient denies any further nausea or vomiting. Patient states she doesn't see the sense and getting a chest x-ray despite having  fevers. Patient asking for coffee.  Objective: Filed Vitals:   04/07/14 1000  BP:   Pulse: 117  Temp:   Resp: 23    Intake/Output Summary (Last 24 hours) at 04/07/14 1049 Last data filed at 04/07/14 1000  Gross per 24 hour  Intake 4640.5 ml  Output    700 ml  Net 3940.5 ml   Filed Weights   04/07/14 0115  Weight: 73.7 kg (162 lb 7.7 oz)    Exam:   General:  NAD  Cardiovascular: RRR  Respiratory: CTAB  Abdomen: Soft, nontender, nondistended, positive bowel sounds.  Musculoskeletal: No clubbing cyanosis or edema.  Data Reviewed: Basic Metabolic Panel:  Recent Labs Lab 04/06/14 1942 04/07/14 0132 04/07/14 0356 04/07/14 0555 04/07/14 0857  NA 137 143 141 141 140  K 4.5 5.0 4.0 4.1 3.9  CL 101 112 115* 114* 117*  CO2 18* 16* GLUCOSE 433* 392* 271* 236* 193*  BUN CREATININE 0.99 0.95 0.78 0.73 0.68  CALCIUM 9.9 8.6 8.2* 8.3* 7.8*  MG  --   --   --  1.7  --    Liver Function Tests:  Recent Labs Lab 04/06/14 1942  AST 40*  ALT 30  ALKPHOS 53  BILITOT 1.2  PROT 7.0  ALBUMIN 4.6   No results for input(s): LIPASE, AMYLASE in the last 168 hours. No results for input(s): AMMONIA in the last 168 hours. CBC:  Recent Labs Lab 04/06/14 1942  WBC 12.9*  HGB 14.4  HCT 43.5  MCV 95.8  PLT 156   Cardiac Enzymes: No results for input(s): CKTOTAL, CKMB, CKMBINDEX, TROPONINI in the last 168 hours. BNP (last 3 results) No results for input(s):  BNP in the last 8760 hours.  ProBNP (last 3 results) No results for input(s): PROBNP in the last 8760 hours.  CBG:  Recent Labs Lab 04/06/14 1931 04/06/14 2137 04/07/14 0100 04/07/14 0239 04/07/14 0338  GLUCAP 357* 378* 356* 298* 262*    No results found for this or any previous visit (from the past 240 hour(s)).   Studies: No results found.  Scheduled Meds: . carbamazepine  400 mg Oral Daily  . carbamazepine  600 mg Oral QHS  . fluticasone  1 spray Each Nare Daily  .  heparin  5,000 Units Subcutaneous 3 times per day  . lamoTRIgine  200 mg Oral BID  . loratadine  10 mg Oral Daily  . magnesium sulfate 1 - 4 g bolus IVPB  2 g Intravenous Once  . nicotine  21 mg Transdermal Daily  . rosuvastatin  40 mg Oral QHS  . ziprasidone  120 mg Oral QHS   Continuous Infusions: . sodium chloride 150 mL/hr (04/07/14 0100)  . dextrose 5 % and 0.45% NaCl 150 mL/hr (04/07/14 0505)  . insulin (NOVOLIN-R) infusion 7.6 Units/hr (04/07/14 1000)    Principal Problem:   DKA (diabetic ketoacidoses) Active Problems:   Type 1 diabetes mellitus   Depression   Migraine   Allergic rhinitis   Osteoporosis   Bipolar 1 disorder   Nausea & vomiting   HLD (hyperlipidemia)   Tobacco abuse   Fever    Time spent: 40 mins    [facility] D[facility] Phys[facility] Pav[facility] Surgery CenterHOMPSON,Amadu Schlageter MD [facility] Pager [phone]. If 7PM-7AM, please contact night-coverage at [email or web address], password [email or web address] 04/07/2014, 10:49 AM  LOS: 1 day

## 2014-04-08 ENCOUNTER — Ambulatory Visit: Payer: BC Managed Care – PPO | Admitting: Endocrinology

## 2014-04-08 DIAGNOSIS — R112 Nausea with vomiting, unspecified: Secondary | ICD-10-CM

## 2014-04-08 DIAGNOSIS — D649 Anemia, unspecified: Secondary | ICD-10-CM

## 2014-04-08 DIAGNOSIS — E1010 Type 1 diabetes mellitus with ketoacidosis without coma: Secondary | ICD-10-CM | POA: Insufficient documentation

## 2014-04-08 LAB — BASIC METABOLIC PANEL
Anion gap: 5 (ref 5–15)
BUN: 14 mg/dL (ref 6–23)
CALCIUM: 7.9 mg/dL — AB (ref 8.4–10.5)
CO2: 22 mmol/L (ref 19–32)
Chloride: 112 mmol/L (ref 96–112)
Creatinine, Ser: 0.66 mg/dL (ref 0.50–1.10)
GFR calc Af Amer: >90 mL/min (ref >90)
Glucose, Bld: 163 mg/dL — ABNORMAL HIGH (ref 70–99)
Potassium: 4.2 mmol/L (ref 3.5–5.1)
Sodium: 139 mmol/L (ref 135–145)

## 2014-04-08 LAB — GLUCOSE, CAPILLARY
GLUCOSE-CAPILLARY: 94 mg/dL (ref 70–99)
GLUCOSE-CAPILLARY: 140 mg/dL — AB (ref 70–99)
Glucose-Capillary: 153 mg/dL — ABNORMAL HIGH (ref 70–99)

## 2014-04-08 LAB — CBC WITH DIFFERENTIAL/PLATELET
BASOS ABS: 0 10*3/uL (ref 0.0–0.1)
BASOS PCT: 0 % (ref 0–1)
EOS ABS: 0 10*3/uL (ref 0.0–0.7)
EOS PCT: 1 % (ref 0–5)
HEMATOCRIT: 33.1 % — AB (ref 36.0–46.0)
HEMOGLOBIN: 10.9 g/dL — AB (ref 12.0–15.0)
Lymphocytes Relative: 18 % (ref 12–46)
Lymphs Abs: 1.2 10*3/uL (ref 0.7–4.0)
MCH: 32 pg (ref 26.0–34.0)
MCHC: 32.9 g/dL (ref 30.0–36.0)
MCV: 97.1 fL (ref 78.0–100.0)
MONO ABS: 0.5 10*3/uL (ref 0.1–1.0)
MONOS PCT: 8 % (ref 3–12)
Neutro Abs: 4.8 10*3/uL (ref 1.7–7.7)
Neutrophils Relative %: 74 % (ref 43–77)
Platelets: 127 10*3/uL — ABNORMAL LOW (ref 150–400)
RBC: 3.41 MIL/uL — ABNORMAL LOW (ref 3.87–5.11)
RDW: 13.2 % (ref 11.5–15.5)
WBC: 6.5 10*3/uL (ref 4.0–10.5)

## 2014-04-08 NOTE — Progress Notes (Signed)
Pt lost her IV access in ICU before transfer to 5W; IV team notified and came, but pt refused the IV restart stating "I will go home tomorrow and I don't need that." Will continue to monitor.

## 2014-04-08 NOTE — Discharge Summary (Signed)
Physician Discharge Summary  Molly [name] [facility] LPRORK WUX:[id number] DOB: 1970/02/23 DOA: 04/06/2014  PCP: Romero BellingELLISON, SEAN, MD  Admit date: 04/06/2014 Discharge date: 04/08/2014  Time spent: 65 minutes  Recommendations for Outpatient Follow-up:  1. Follow-up with Romero BellingELLISON, SEAN, MD on 04/09/14 at 8:45am. On follow-up patient's diabetes will need to be reassessed. Patient will need a basic metabolic profile done to follow-up on electrolytes and renal function. Patient need a CBC done to follow-up on her anemia and may need further anemia workup as outpatient.  Discharge Diagnoses:  Principal Problem:   DKA (diabetic ketoacidoses) Active Problems:   Type 1 diabetes mellitus   Depression   Migraine   Allergic rhinitis   Osteoporosis   Bipolar 1 disorder   Nausea & vomiting   HLD (hyperlipidemia)   Tobacco abuse   Fever   Anemia   Diabetic ketoacidosis without coma associated with type 1 diabetes mellitus   Discharge Condition: Stable and improved  Diet recommendation: Carb modified diet  Filed Weights   04/07/14 0115  Weight: 73.7 kg (162 lb 7.7 oz)    History of present illness:  Molly Duelheresa [name] RORK is a 44 y.[name]. female with past medical history of diabetes mellitus type 1 on insulin pump, hyperlipidemia, depression, bipolar disorder, migraine headache, tobacco abuse, who presented to ED with nausea, vomiting and high blood sugar.  Patient reported that one day prior to admission, she developed nausea and vomiting. She vomited many times without blood in the vomitus. She felt very dehydrated. She did not have abdominal pain or diarrhea. Her blood sugar was elevated at 237 in the afternoon. She tried to treat herself with insulin pump once, but her CBG was still elevated. She then realized that her insulin pump had mechanical problem. Patient denied fever, chills, runny nose, sore throat, headaches, cough, chest pain, SOB, abdominal pain, diarrhea, constipation, dysuria, urgency, frequency, hematuria,  skin rashes or leg swelling. No unilateral weakness, numbness or tingling sensations. No vision change or hearing loss.  In ED, patient was found to have elevated anion gap at18, bicarbonate 18, leukocytosis with WBC 12.9, potassium 4.5. Patient was admitted to inpatient for further evaluation and treatment.  Hospital Course:  #1 DKA Questionable etiology. Patient on insulin pump at home and felt to have a mechanical failure. Patient was admitted in DKA and placed on the glucose stabilizer and admitted to the step down unit. Urinalysis which was done was negative. Blood cultures were ordered however no growth to date. EKG showed a sinus tachycardia with no ischemic changes. Patient improved clinically. Patient spiked fever up to 101.4. Chest x-ray was ordered however patient is refusing chest x-ray stating she "doesn't see the sense in check a CXR despite fever and presenting with nausea and emesis. " Urinalysis is negative. Blood cultures are pending. Patient's blood sugars improved and she was subsequently transitioned to subcutaneous Lantus written patient was placed on clear liquids and diet advanced to a cop modified diet which she tolerated. Patient did not have any further nausea or vomiting. Patient denied any abdominal pain. Patient will be discharged home in stable and improved condition. Patient be discharged home back on her insulin pump. Patient is to follow-up with PCP as outpatient. Patient will be discharged in stable and improved condition.   #2 diabetes mellitus type 1 Hemoglobin A1c was 7.5 on 03/04/2014. Patient supposedly was an insulin pump. Patient was placed on the DKA protocol during the hospitalization. Patient will be discharged home and patient states she will be resuming her  insulin pump at that time. Patient is to follow-up with PCP as outpatient.   #3 fevers Patient spiking a fever to a temp of 101.4. Patient presented in DKA. Urinalysis was negative. Blood cultures are  pending with no growth to date by day of discharge. Chest x-ray was ordered however patient refusing chest x-ray stating she doesn't see any sensing getting a chest x-ray to investigate the fever and as such  chest x-ray was canceled. Patient remained afebrile 24 hours, discharged in stable condition.  #4 depression and bipolar disorder Stable. No suicidal or homicidal ideations. Continued on home regimen of Tegretol, Lamictal, Geodon.  #5 migraine headaches Stable. Imitrex as needed.  #6 hyperlipidemia Continued on home dose Crestor.  #7 tobacco abuse Tobacco cessation. Patient was placed on a nicotine patch.  #8 anemia Patient was noted to develop an anemia during the hospitalization which was felt to be likely dilutional in nature. Patient's hemoglobin dropped down to 10.9 from 14.4 on admission. Patient did not have any overt bleeding. Patient remained asymptomatic. Patient will need to follow-up with PCP as outpatient for further evaluation of her anemia.   Procedures:  None  Consultations:  None  Discharge Exam: Filed Vitals:   04/08/14 0511  BP: 145/76  Pulse: 99  Temp: 98.9 F (37.2 C)  Resp: 16    General: NAD Cardiovascular: RRR Respiratory: CTAB  Discharge Instructions   Discharge Instructions    Diet Carb Modified    Complete by:  As directed      Discharge instructions    Complete by:  As directed   Follow up with Romero Belling, MD in 1 week.     Increase activity slowly    Complete by:  As directed           Current Discharge Medication List    CONTINUE these medications which have NOT CHANGED   Details  alendronate (FOSAMAX) 70 MG tablet TAKE 1 TABLET BY MOUTH EVERY 7 DAYS. TAKE WITH FULL GLASS OF WATER ON AN EMPTY STOMACH Qty: 4 tablet, Refills: 3    carbamazepine (TEGRETOL) 200 MG tablet Take 400-600 mg by mouth 2 (two) times daily. 2 tablets in the am and 3 tablets in the pm    cetirizine (ZYRTEC) 10 MG tablet Take 10 mg by mouth daily.     fluticasone (FLONASE) 50 MCG/ACT nasal spray Place into both nostrils daily.    insulin aspart (NOVOLOG) 100 UNIT/ML injection For use in pump, total 70 units/day Qty: 7 vial, Refills: 3    lamoTRIgine (LAMICTAL) 200 MG tablet Take 200 mg by mouth 2 (two) times daily.     rosuvastatin (CRESTOR) 40 MG tablet Take 1 tablet (40 mg total) by mouth daily. 1 tablet at bedtime Qty: 90 tablet, Refills: 3    ziprasidone (GEODON) 60 MG capsule Take 120 mg by mouth at bedtime. Take 2 by mouth at bedtime    Alcohol Swabs (ALCOHOL PREP) 70 % PADS 8/day  250.01 Qty: 800 each, Refills: 3    glucose blood (FREESTYLE LITE) test strip 8/day dx 250.01, and lancets Qty: 800 each, Refills: 3    !! Insulin Infusion Pump Supplies (MINIMED INFUSION SET-MMT 398) MISC Inject 1 Device into the skin 3 days. Change every 3 days Qty: 30 each, Refills: 3    !! Insulin Infusion Pump Supplies (PARADIGM RESERVOIR ) MISC Inject 1 Device into the skin 3 days. Change every 3 days Qty: 30 each, Refills: 3    Lancets (FREESTYLE) lancets Use as  instructed dx 250.01 Qty: 800 each, Refills: 3    SUMAtriptan (IMITREX) 100 MG tablet Take 1 tablet by mouth every 2 hrs as needed for headaches. May repeat in 2 hours if headache persists or recurs.  Not to exceed 2/day Qty: 20 tablet, Refills: 5    Transparent Dressings (OPSITE IV 3000) MISC Inject 1 Device into the skin 3 days. Qty: 30 each, Refills: 3     !! - Potential duplicate medications found. Please discuss with provider.    STOP taking these medications     insulin aspart (NOVOLOG FLEXPEN) 100 UNIT/ML FlexPen      Insulin Glargine (LANTUS SOLOSTAR) 100 UNIT/ML Solostar Pen      mupirocin ointment (BACTROBAN) 2 %      nystatin-triamcinolone (MYCOLOG II) cream      ondansetron (ZOFRAN) 4 MG tablet        Allergies  Allergen Reactions  . Varenicline Tartrate     REACTION: rash   Follow-up Information    Follow up with Romero Belling, MD On  04/09/2014.   Specialty:  Endocrinology   Why:  follow up at 8:45am    Contact information:   [address] [place] [address] [phone]        The results of significant diagnostics from this hospitalization (including imaging, microbiology, ancillary and laboratory) are listed below for reference.    Significant Diagnostic Studies: No results found.  Microbiology: Recent Results (from the past 240 hour(s))  MRSA PCR Screening     Status: None   Collection Time: 04/07/14  1:18 AM  Result Value Ref Range Status   MRSA by PCR NEGATIVE NEGATIVE Final    Comment:        The GeneXpert MRSA Assay (FDA approved for NASAL specimens only), is one component of a comprehensive MRSA colonization surveillance program. It is not intended to diagnose MRSA infection nor to guide or monitor treatment for MRSA infections. Performed at [facility]   Culture, blood (routine x 2)     Status: None (Preliminary result)   Collection Time: 04/07/14  1:32 AM  Result Value Ref Range Status   Specimen Description BLOOD LEFT ARM  Final   Special Requests BOTTLES DRAWN AEROBIC AND ANAEROBIC 10CC  Final   Culture   Final           BLOOD CULTURE RECEIVED NO GROWTH TO DATE CULTURE WILL BE HELD FOR 5 DAYS BEFORE ISSUING A FINAL NEGATIVE REPORT Performed at [facility]    Report Status PENDING  Incomplete  Culture, blood (routine x 2)     Status: None (Preliminary result)   Collection Time: 04/07/14  1:40 AM  Result Value Ref Range Status   Specimen Description BLOOD RIGHT HAND  Final   Special Requests BOTTLES DRAWN AEROBIC ONLY 1CC  Final   Culture   Final           BLOOD CULTURE RECEIVED NO GROWTH TO DATE CULTURE WILL BE HELD FOR 5 DAYS BEFORE ISSUING A FINAL NEGATIVE REPORT Performed at [facility]    Report Status PENDING  Incomplete     Labs: Basic Metabolic Panel:  Recent Labs Lab 04/07/14 0132 04/07/14 0356 04/07/14 0555  04/07/14 0857 04/08/14 0440  NA 143 141 141 140 139  K 5.0 4.0 4.1 3.9 4.2  CL 112 115* 114* 117* 112  CO2 16* GLUCOSE 392* 271* 236* 193* 163*  BUN 20  14  CREATININE 0.95 0.78 0.73 0.68 0.66  CALCIUM 8.6 8.2* 8.3* 7.8* 7.9*  MG  --   --  1.7  --   --    Liver Function Tests:  Recent Labs Lab 04/06/14 1942  AST 40*  ALT 30  ALKPHOS 53  BILITOT 1.2  PROT 7.0  ALBUMIN 4.6   No results for input(s): LIPASE, AMYLASE in the last 168 hours. No results for input(s): AMMONIA in the last 168 hours. CBC:  Recent Labs Lab 04/06/14 1942 04/08/14 0440  WBC 12.9* 6.5  NEUTROABS  --  4.8  HGB 14.4 10.9*  HCT 43.5 33.1*  MCV 95.8 97.1  PLT 156 127*   Cardiac Enzymes: No results for input(s): CKTOTAL, CKMB, CKMBINDEX, TROPONINI in the last 168 hours. BNP: BNP (last 3 results) No results for input(s): BNP in the last 8760 hours.  ProBNP (last 3 results) No results for input(s): PROBNP in the last 8760 hours.  CBG:  Recent Labs Lab 04/07/14 1559 04/07/14 1646 04/07/14 2031 04/07/14 2140 04/08/14 0744  GLUCAP 113* 148* 186* 172* 153*       Signed:  Justin Meisenheimer MD [facility] 04/08/2014, 10:28 AM

## 2014-04-09 ENCOUNTER — Ambulatory Visit: Payer: BLUE CROSS/BLUE SHIELD | Admitting: Endocrinology

## 2014-04-09 LAB — URINE CULTURE

## 2014-04-13 LAB — CULTURE, BLOOD (ROUTINE X 2)
CULTURE: NO GROWTH
Culture: NO GROWTH

## 2014-04-14 ENCOUNTER — Telehealth: Payer: Self-pay | Admitting: Endocrinology

## 2014-04-14 NOTE — Telephone Encounter (Signed)
See note below and please advise, Thanks! 

## 2014-04-14 NOTE — Telephone Encounter (Signed)
I contacted pt. Pt verified that she has not received any steroid injections, denied fever, nausea, vomiting, shortness of breath or pump problems.  Verified pump settings. Pt stated the below basal rate and mealtime bolus are correct. Pt did state for her correction bolus she was doing 1 units for each 40 instead of each 50. Pt was advised to increase correction bolus to units for each 25 by which her glucose exceeds 100. Pt is coming in for a appointment on 04/16/2014 at 11 am.

## 2014-04-14 NOTE — Telephone Encounter (Signed)
i reviewed med list.  i don't see steroids.  Did you receive an injection? Any other reason you can think of why it is high (such as fever, n/v, sob, or pump problems)? i would be happy to see you in the office tomorrow.  Please verify these are your pump settings: continue a basal rate to 0.9 units/hr, 24 hrs per day. Suspend it for 1-2 hrs, if you are active.  mealtime bolus of 1 unit/ 13 grams carbohydrate. however, subtract 1 unit from calculated lunch bolus. Add 3 units to calculated breakfast bolus.  correction bolus (which some people call "sensitivity," or "insulin sensitivity ratio," or just "isr") of 1 unit for each 50   If so, for now, please increase the correction bolus to 1 unit for each 25 by which your glucose exceeds 100.

## 2014-04-14 NOTE — Telephone Encounter (Signed)
Patient called stating her blood sugar is spiraling out of control   Blood sugars: 421 took 8 units still at 421  Please advise

## 2014-04-14 NOTE — ED Provider Notes (Signed)
CSN: [id number]     Arrival date & time 04/06/14  1921 History   First MD Initiated Contact with Patient 04/06/14 2316     Chief Complaint  Patient presents with  . Hyperglycemia  . Nausea      HPI Pt noticed her blood sugar was up earlier today; noticed her insulin pump wasn't working tonight. Past Medical History  Diagnosis Date  . SMOKER 08/11/2009  . CONTUSION, ANKLE 08/28/2007  . SEBACEOUS CYST, INFECTED 07/13/2007  . TINEA VERSICOLOR 07/13/2007  . ALLERGIC RHINITIS CAUSE UNSPECIFIED 03/06/2007  . MIGRAINE, CHRONIC 03/06/2007  . DIABETES MELLITUS, TYPE I 08/03/2006  . DEPRESSION 08/03/2006  . THROMBOCYTOPENIA 05/07/2008  . Encounter for long-term (current) use of other medications   . DM retinopathy   . Bipolar 1 disorder   . Osteoporosis   . Hyperlipidemia    Past Surgical History  Procedure Laterality Date  . Abnormal u/s  12/10/1996  . Electrocardiogram  06/13/2006  . Tubal ligation     Family History  Problem Relation Age of Onset  . Diabetes Father     (Oral Agents)  . Diabetes Paternal Grandmother   . Hypertension Paternal Grandmother   . Diabetes Paternal Grandfather   . Cancer Paternal Grandfather     colon  . Hypertension Paternal Grandfather    History  Substance Use Topics  . Smoking status: Former Smoker    Quit date: 10/16/2010  . Smokeless tobacco: Never Used  . Alcohol Use: No   OB History    Gravida Para Term Preterm AB TAB SAB Ectopic Multiple Living   Review of Systems  Constitutional: Negative for fever and chills.  Gastrointestinal: Positive for nausea and vomiting. Negative for abdominal pain.  All other systems reviewed and are negative.     Allergies  Varenicline tartrate  Home Medications   Prior to Admission medications   Medication Sig Start Date End Date Taking? Authorizing Provider  alendronate (FOSAMAX) 70 MG tablet TAKE 1 TABLET BY MOUTH EVERY 7 DAYS. TAKE WITH FULL GLASS OF WATER ON AN EMPTY  STOMACH 02/17/14  Yes Romero Belling, MD  carbamazepine (TEGRETOL) 200 MG tablet Take 400-600 mg by mouth 2 (two) times daily. 2 tablets in the am and 3 tablets in the pm   Yes Historical Provider, MD  cetirizine (ZYRTEC) 10 MG tablet Take 10 mg by mouth daily.   Yes Historical Provider, MD  fluticasone (FLONASE) 50 MCG/ACT nasal spray Place into both nostrils daily.   Yes Historical Provider, MD  insulin aspart (NOVOLOG) 100 UNIT/ML injection For use in pump, total 70 units/day 03/04/14  Yes Romero Belling, MD  lamoTRIgine (LAMICTAL) 200 MG tablet Take 200 mg by mouth 2 (two) times daily.    Yes Historical Provider, MD  rosuvastatin (CRESTOR) 40 MG tablet Take 1 tablet (40 mg total) by mouth daily. 1 tablet at bedtime 01/07/14  Yes Romero Belling, MD  ziprasidone (GEODON) 60 MG capsule Take 120 mg by mouth at bedtime. Take 2 by mouth at bedtime   Yes Historical Provider, MD  Alcohol Swabs (ALCOHOL PREP) 70 % PADS 8/day  250.01 10/27/11   Romero Belling, MD  glucose blood (FREESTYLE LITE) test strip 8/day dx 250.01, and lancets 08/09/12   Romero Belling, MD  Insulin Infusion Pump Supplies (MINIMED INFUSION SET-MMT 398) MISC Inject 1 Device into the skin 3 days. Change every 3 days 10/27/11   Romero Belling, MD  Insulin Infusion Pump Supplies (PARADIGM RESERVOIR 3ML) MISC Inject 1 Device into the skin 3 days. Change every 3 days 10/27/11   Romero BellingSean Ellison, MD  Lancets (FREESTYLE) lancets Use as instructed dx 250.01 08/10/12   Romero BellingSean Ellison, MD  SUMAtriptan (IMITREX) 100 MG tablet Take 1 tablet by mouth every 2 hrs as needed for headaches. May repeat in 2 hours if headache persists or recurs.  Not to exceed 2/day 03/18/14   Romero BellingSean Ellison, MD  Transparent Dressings (OPSITE IV 3000) MISC Inject 1 Device into the skin 3 days. 04/26/10   Romero BellingSean Ellison, MD   BP 145/76 mmHg  Pulse 99  Temp(Src) 98.9 F (37.2 C) (Oral)  Resp 16  Ht 5\' 7"  (1.702 m)  Wt 162 lb 7.7 oz (73.7 kg)  BMI 25.44 kg/m2  SpO2 99% Physical Exam   Constitutional: She is oriented to person, place, and time. She appears well-developed and well-nourished. No distress.  HENT:  Head: Normocephalic and atraumatic.  Eyes: Pupils are equal, round, and reactive to light.  Neck: Normal range of motion.  Cardiovascular: Normal rate and intact distal pulses.   No murmur heard. Pulmonary/Chest: No respiratory distress.  Abdominal: Normal appearance. She exhibits no distension. There is no tenderness. There is no rebound and no guarding.  Musculoskeletal: Normal range of motion.  Neurological: She is alert and oriented to person, place, and time. No cranial nerve deficit.  Skin: Skin is warm and dry. No rash noted.  Psychiatric: She has a normal mood and affect. Her behavior is normal.  Nursing note and vitals reviewed.   ED Course  Procedures (including critical care time)   Medications  ondansetron (ZOFRAN) injection 4 mg (4 mg Intravenous Given 04/07/14 0103)  ondansetron (ZOFRAN-ODT) disintegrating tablet 8 mg (8 mg Oral Given 04/06/14 1940)  sodium chloride 0.9 % bolus 1,000 mL (0 mLs Intravenous Stopped 04/07/14 0002)  metoCLOPramide (REGLAN) injection 10 mg (10 mg Intramuscular Given 04/06/14 2246)  diphenhydrAMINE (BENADRYL) injection 25 mg (25 mg Intravenous Given 04/06/14 2242)  sodium chloride 0.9 % bolus 2,000 mL (2,000 mLs Intravenous New Bag/Given 04/07/14 0011)  potassium chloride 10 mEq in 100 mL IVPB (10 mEq Intravenous Given 04/07/14 0415)  sodium chloride 0.9 % bolus 2,000 mL (2,000 mLs Intravenous Given 04/07/14 0801)  magnesium sulfate IVPB 2 g 50 mL (2 g Intravenous Given 04/07/14 1200)    CRITICAL CARE Performed by: Nelva NayBEATON,Reneshia Zuccaro L Total critical care time: 30 min Critical care time was exclusive of separately billable procedures and treating other patients. Critical care was necessary to treat or prevent imminent or life-threatening deterioration. Critical care was time spent personally by me on the following  activities: development of treatment plan with patient and/or surrogate as well as nursing, discussions with consultants, evaluation of patient's response to treatment, examination of patient, obtaining history from patient or surrogate, ordering and performing treatments and interventions, ordering and review of laboratory studies, ordering and review of radiographic studies, pulse oximetry and re-evaluation of patient's condition.  Labs Review Labs Reviewed  CBC - Abnormal; Notable for the following:    WBC 12.9 (*)    All other components within normal limits  COMPREHENSIVE METABOLIC PANEL - Abnormal; Notable for the following:    CO2 18 (*)    Glucose, Bld 433 (*)    AST 40 (*)    GFR calc non Af Amer 68 (*)    GFR calc Af Amer 79 (*)    Anion gap 18 (*)    All other  components within normal limits  URINALYSIS, ROUTINE W REFLEX MICROSCOPIC - Abnormal; Notable for the following:    Glucose, UA >1000 (*)    Ketones, ur >80 (*)    All other components within normal limits  BASIC METABOLIC PANEL - Abnormal; Notable for the following:    CO2 16 (*)    Glucose, Bld 392 (*)    GFR calc non Af Amer 72 (*)    GFR calc Af Amer 83 (*)    All other components within normal limits  BASIC METABOLIC PANEL - Abnormal; Notable for the following:    Chloride 115 (*)    Glucose, Bld 271 (*)    Calcium 8.2 (*)    All other components within normal limits  BASIC METABOLIC PANEL - Abnormal; Notable for the following:    Chloride 114 (*)    Glucose, Bld 236 (*)    Calcium 8.3 (*)    All other components within normal limits  BASIC METABOLIC PANEL - Abnormal; Notable for the following:    Chloride 117 (*)    Glucose, Bld 193 (*)    Calcium 7.8 (*)    Anion gap 2 (*)    All other components within normal limits  GLUCOSE, CAPILLARY - Abnormal; Notable for the following:    Glucose-Capillary 356 (*)    All other components within normal limits  GLUCOSE, CAPILLARY - Abnormal; Notable for the  following:    Glucose-Capillary 298 (*)    All other components within normal limits  PROTIME-INR - Abnormal; Notable for the following:    Prothrombin Time 16.0 (*)    All other components within normal limits  GLUCOSE, CAPILLARY - Abnormal; Notable for the following:    Glucose-Capillary 262 (*)    All other components within normal limits  GLUCOSE, CAPILLARY - Abnormal; Notable for the following:    Glucose-Capillary 235 (*)    All other components within normal limits  GLUCOSE, CAPILLARY - Abnormal; Notable for the following:    Glucose-Capillary 223 (*)    All other components within normal limits  GLUCOSE, CAPILLARY - Abnormal; Notable for the following:    Glucose-Capillary 219 (*)    All other components within normal limits  GLUCOSE, CAPILLARY - Abnormal; Notable for the following:    Glucose-Capillary 212 (*)    All other components within normal limits  GLUCOSE, CAPILLARY - Abnormal; Notable for the following:    Glucose-Capillary 110 (*)    All other components within normal limits  GLUCOSE, CAPILLARY - Abnormal; Notable for the following:    Glucose-Capillary 182 (*)    All other components within normal limits  CBC WITH DIFFERENTIAL/PLATELET - Abnormal; Notable for the following:    RBC 3.41 (*)    Hemoglobin 10.9 (*)    HCT 33.1 (*)    Platelets 127 (*)    All other components within normal limits  BASIC METABOLIC PANEL - Abnormal; Notable for the following:    Glucose, Bld 163 (*)    Calcium 7.9 (*)    All other components within normal limits  GLUCOSE, CAPILLARY - Abnormal; Notable for the following:    Glucose-Capillary 148 (*)    All other components within normal limits  GLUCOSE, CAPILLARY - Abnormal; Notable for the following:    Glucose-Capillary 155 (*)    All other components within normal limits  GLUCOSE, CAPILLARY - Abnormal; Notable for the following:    Glucose-Capillary 113 (*)    All other components within normal limits  GLUCOSE, CAPILLARY  - Abnormal; Notable for the following:    Glucose-Capillary 186 (*)    All other components within normal limits  GLUCOSE, CAPILLARY - Abnormal; Notable for the following:    Glucose-Capillary 172 (*)    All other components within normal limits  GLUCOSE, CAPILLARY - Abnormal; Notable for the following:    Glucose-Capillary 140 (*)    All other components within normal limits  GLUCOSE, CAPILLARY - Abnormal; Notable for the following:    Glucose-Capillary 153 (*)    All other components within normal limits  CBG MONITORING, ED - Abnormal; Notable for the following:    Glucose-Capillary 357 (*)    All other components within normal limits  CBG MONITORING, ED - Abnormal; Notable for the following:    Glucose-Capillary 378 (*)    All other components within normal limits  CULTURE, BLOOD (ROUTINE X 2)  CULTURE, BLOOD (ROUTINE X 2)  URINE CULTURE  MRSA PCR SCREENING  PREGNANCY, URINE  URINE MICROSCOPIC-ADD ON  URINE RAPID DRUG SCREEN (HOSP PERFORMED)  LACTIC ACID, PLASMA  LACTIC ACID, PLASMA  PROCALCITONIN  APTT  MAGNESIUM  GLUCOSE, CAPILLARY  GLUCOSE, CAPILLARY  GLUCOSE, CAPILLARY    Imaging Review No results found.    MDM   Final diagnoses:  Diabetic ketoacidosis without coma associated with type 1 diabetes mellitus        Nelva Nay, MD 04/14/14 1114

## 2014-04-16 ENCOUNTER — Encounter: Payer: Self-pay | Admitting: Endocrinology

## 2014-04-16 ENCOUNTER — Ambulatory Visit (INDEPENDENT_AMBULATORY_CARE_PROVIDER_SITE_OTHER): Payer: BLUE CROSS/BLUE SHIELD | Admitting: Endocrinology

## 2014-04-16 VITALS — BP 122/60 | HR 71 | Temp 98.4°F | Ht 67.0 in | Wt 165.0 lb

## 2014-04-16 DIAGNOSIS — E1010 Type 1 diabetes mellitus with ketoacidosis without coma: Secondary | ICD-10-CM | POA: Diagnosis not present

## 2014-04-16 DIAGNOSIS — D649 Anemia, unspecified: Secondary | ICD-10-CM | POA: Diagnosis not present

## 2014-04-16 LAB — CBC WITH DIFFERENTIAL/PLATELET
BASOS ABS: 0 10*3/uL (ref 0.0–0.1)
Basophils Relative: 0.9 % (ref 0.0–3.0)
EOS ABS: 0 10*3/uL (ref 0.0–0.7)
Eosinophils Relative: 1.2 % (ref 0.0–5.0)
HCT: 38.9 % (ref 36.0–46.0)
Hemoglobin: 13.1 g/dL (ref 12.0–15.0)
Lymphocytes Relative: 25.6 % (ref 12.0–46.0)
Lymphs Abs: 0.9 10*3/uL (ref 0.7–4.0)
MCHC: 33.7 g/dL (ref 30.0–36.0)
MCV: 94.9 fl (ref 78.0–100.0)
MONO ABS: 0.4 10*3/uL (ref 0.1–1.0)
Monocytes Relative: 12.2 % — ABNORMAL HIGH (ref 3.0–12.0)
NEUTROS PCT: 60.1 % (ref 43.0–77.0)
Neutro Abs: 2.1 10*3/uL (ref 1.4–7.7)
Platelets: 140 10*3/uL — ABNORMAL LOW (ref 150.0–400.0)
RBC: 4.1 Mil/uL (ref 3.87–5.11)
RDW: 13.5 % (ref 11.5–15.5)
WBC: 3.5 10*3/uL — AB (ref 4.0–10.5)

## 2014-04-16 LAB — BASIC METABOLIC PANEL
BUN: 8 mg/dL (ref 6–23)
CALCIUM: 9.3 mg/dL (ref 8.4–10.5)
CO2: 31 meq/L (ref 19–32)
Chloride: 102 mEq/L (ref 96–112)
Creatinine, Ser: 0.74 mg/dL (ref 0.40–1.20)
GFR: 90.39 mL/min (ref >60.00)
Glucose, Bld: 107 mg/dL — ABNORMAL HIGH (ref 70–99)
Potassium: 4.3 mEq/L (ref 3.5–5.1)
Sodium: 135 mEq/L (ref 135–145)

## 2014-04-16 LAB — IBC PANEL
IRON: 127 ug/dL (ref 42–145)
Saturation Ratios: 44.9 % (ref 20.0–50.0)
Transferrin: 202 mg/dL — ABNORMAL LOW (ref 212.0–360.0)

## 2014-04-16 MED ORDER — INSULIN LISPRO 100 UNIT/ML ~~LOC~~ SOLN
SUBCUTANEOUS | Status: DC
Start: 2014-04-16 — End: 2014-04-21

## 2014-04-16 MED ORDER — INSULIN LISPRO 100 UNIT/ML ~~LOC~~ SOLN: SUBCUTANEOUS | Status: DC

## 2014-04-16 NOTE — Patient Instructions (Signed)
Please continue the same pump settings: basal rate of 0.9 units/hr, 24 hrs per day. Suspend it for 1-2 hrs, if you are active.  mealtime bolus of 1 unit/ 13 grams carbohydrate. however, subtract 1 unit from calculated lunch bolus. Add 3 units to calculated breakfast bolus.  correction bolus (which some people call "sensitivity," or "insulin sensitivity ratio," or just "isr") of 1 unit for each 50 . blood tests are being requested for you today.  We'll let you know about the results. check your blood sugar 8 times per day, including if you have symptoms of your blood sugar being too high or too low.  please keep a record of the readings and bring it to your next appointment here.  You can write it on any piece of paper.  please call us sooner if your blood sugar goes below 70, or if you have a lot of readings over 200.

## 2014-04-16 NOTE — Progress Notes (Signed)
   Subjective:    Patient ID: Molly [name], female    DOB: [birth year], 44 y.o.   MRN: [id number]  HPI Pt returns for f/u of diabetes mellitus: DM type: 1 Dx'ed: 1983 Complications: none Therapy: insulin since dx DKA: last episode was in 2011 Severe hypoglycemia: last episode was many years ago.  Pancreatitis: never.  Other: she has a paradigm insulin pump; she declines continuous glucose monitor, due to cost Interval history: she brings a record of her cbg's which i have reviewed today.  It varies from 53-200, but most are in the 100's.  She has mild hypoglycemia approx twice per month.  There is no trend throughout the day. She takes a total of approx 70 units per day.    Pt was recently in the hospital, with DKA.  Pt says this was due to a bad bottle of insulin.  Denies edema Denies BRBPR    Review of Systems Denies hematuria.  She has lost a few lbs.      Objective:   Physical Exam VITAL SIGNS:  See vs page GENERAL: no distress Pulses: dorsalis pedis intact bilat.   MSK: no deformity of the feet CV: no leg edema Skin:  no ulcer on the feet.  normal color and temp on the feet. Neuro: sensation is intact to touch on the feet   Lab Results  Component Value Date   WBC 3.5* 04/16/2014   HGB 13.1 04/16/2014   HCT 38.9 04/16/2014   MCV 94.9 04/16/2014   PLT 140.0* 04/16/2014    Lab Results  Component Value Date   CREATININE 0.74 04/16/2014   BUN 8 04/16/2014   NA 135 04/16/2014   K 4.3 04/16/2014   CL 102 04/16/2014   CO2 31 04/16/2014       Assessment & Plan:  DM: much worse, reason is uncertain.  I discussed with Cristy FolksLinda Spagnola, RN.  She is seeing pt today.  i have written pt a letter to change novolog back to humalog Anemia: resolved.  We'll recheck in the future.  Hypocalcemia: resolved.  We'll recheck in the future    Patient is advised the following: Patient Instructions  Please continue the same pump settings: basal rate of 0.9 units/hr, 24 hrs per  day. Suspend it for 1-2 hrs, if you are active.  mealtime bolus of 1 unit/ 13 grams carbohydrate. however, subtract 1 unit from calculated lunch bolus. Add 3 units to calculated breakfast bolus.  correction bolus (which some people call "sensitivity," or "insulin sensitivity ratio," or just "isr") of 1 unit for each 50 . blood tests are being requested for you today.  We'll let you know about the results. check your blood sugar 8 times per day, including if you have symptoms of your blood sugar being too high or too low.  please keep a record of the readings and bring it to your next appointment here.  You can write it on any piece of paper.  please call us sooner if your blood sugar goes below 70, or if you have a lot of readings over 200.

## 2014-04-21 ENCOUNTER — Telehealth: Payer: Self-pay

## 2014-04-21 MED ORDER — INSULIN ASPART 100 UNIT/ML ~~LOC~~ SOLN: SUBCUTANEOUS | Status: DC

## 2014-04-21 NOTE — Telephone Encounter (Signed)
ok 

## 2014-04-21 NOTE — Telephone Encounter (Signed)
Received a notice from pt's pharmacy stating her Humalog is no longer covered by her insurance. Please advise if ok to changed to novolog during Dr. George HughEllison's absence.  Thanks!

## 2014-04-21 NOTE — Telephone Encounter (Signed)
Rx sent 

## 2014-04-21 NOTE — Addendum Note (Signed)
Addended by: Bethann PunchesUCK, Tkeya Stencil E on: 04/21/2014 03:49 PM   Modules accepted: Orders, Medications

## 2014-05-22 ENCOUNTER — Encounter: Payer: Self-pay | Admitting: Internal Medicine

## 2014-05-22 ENCOUNTER — Ambulatory Visit (INDEPENDENT_AMBULATORY_CARE_PROVIDER_SITE_OTHER): Payer: BLUE CROSS/BLUE SHIELD | Admitting: Internal Medicine

## 2014-05-22 ENCOUNTER — Other Ambulatory Visit: Payer: BLUE CROSS/BLUE SHIELD

## 2014-05-22 VITALS — BP 124/60 | HR 61 | Ht 67.0 in | Wt 161.4 lb

## 2014-05-22 DIAGNOSIS — J0141 Acute recurrent pansinusitis: Secondary | ICD-10-CM

## 2014-05-22 DIAGNOSIS — J309 Allergic rhinitis, unspecified: Secondary | ICD-10-CM | POA: Diagnosis not present

## 2014-05-22 DIAGNOSIS — J302 Other seasonal allergic rhinitis: Secondary | ICD-10-CM

## 2014-05-22 DIAGNOSIS — J3089 Other allergic rhinitis: Principal | ICD-10-CM

## 2014-05-22 MED ORDER — AZELASTINE-FLUTICASONE 137-50 MCG/ACT NA SUSP: 2.0000 | Freq: Every day | NASAL | Status: DC

## 2014-05-22 NOTE — Progress Notes (Signed)
05/22/14- 89 yoF former smoker referred courtesy of Dr Mercy RidingEllison-frequent sinus infections; has to take Zyrtec D and Flonase every day-when not on this she feels horrible. No Allergy labs found in EPIC.  Quit smoking 3 years ago. BiPolar, depression, Dm1, She reports several times / year for many years she will get a sinus infection-as many as 6 times a year. She feels this as maxillary and frontal pressure bilaterally with sneezing, nasal dripping, clear nasal discharge. Nonseasonal. No history of ENT surgery. Some wheeze if she has an acute bronchitis, without diagnosed asthma. She doesn't recognize these events as routinely related with colds. She cites symptom history she interprets as allergy to grass, trees, cats. If she picks up a cat her skin will itch and her eyes will water. No specific intolerance to foods, aspirin, or unusual reaction to insect sting. Environment: Lives with 2 school-aged children. Denies mold or cigarette smoke exposure. Outdoor cats which come only to be fed.  Prior to Admission medications   Medication Sig Start Date End Date Taking? Authorizing Provider  Alcohol Swabs (ALCOHOL PREP) 70 % PADS 8/day  250.01 10/27/11  Yes Romero BellingSean Ellison, MD  alendronate (FOSAMAX) 70 MG tablet TAKE 1 TABLET BY MOUTH EVERY 7 DAYS. TAKE WITH FULL GLASS OF WATER ON AN EMPTY STOMACH 02/17/14  Yes Romero BellingSean Ellison, MD  Calcium Carbonate (CALCIUM 600 PO) Take 2 tablets by mouth daily.   Yes Historical Provider, MD  carbamazepine (TEGRETOL) 200 MG tablet Take 400-600 mg by mouth 2 (two) times daily. 2 tablets in the am and 3 tablets in the pm   Yes Historical Provider, MD  cetirizine (ZYRTEC) 10 MG tablet Take 10 mg by mouth daily.   Yes Historical Provider, MD  fluticasone (FLONASE) 50 MCG/ACT nasal spray Place into both nostrils daily.   Yes Historical Provider, MD  glucose blood (FREESTYLE LITE) test strip 8/day dx 250.01, and lancets 08/09/12  Yes Romero BellingSean Ellison, MD  insulin aspart (NOVOLOG) 100  UNIT/ML injection Use 70 units per day via pump 04/21/14  Yes Romero BellingSean Ellison, MD  Insulin Infusion Pump Supplies (MINIMED INFUSION SET-MMT 398) MISC Inject 1 Device into the skin 3 days. Change every 3 days 10/27/11  Yes Romero BellingSean Ellison, MD  Insulin Infusion Pump Supplies (PARADIGM RESERVOIR 3ML) MISC Inject 1 Device into the skin 3 days. Change every 3 days 10/27/11  Yes Romero BellingSean Ellison, MD  lamoTRIgine (LAMICTAL) 200 MG tablet Take 200 mg by mouth 2 (two) times daily.    Yes Historical Provider, MD  Lancets (FREESTYLE) lancets Use as instructed dx 250.01 08/10/12  Yes Romero BellingSean Ellison, MD  Multiple Vitamin (MULTIVITAMIN) tablet Take 1 tablet by mouth daily.   Yes Historical Provider, MD  rosuvastatin (CRESTOR) 40 MG tablet Take 1 tablet (40 mg total) by mouth daily. 1 tablet at bedtime 01/07/14  Yes Romero BellingSean Ellison, MD  SUMAtriptan (IMITREX) 100 MG tablet Take 1 tablet by mouth every 2 hrs as needed for headaches. May repeat in 2 hours if headache persists or recurs.  Not to exceed 2/day 03/18/14  Yes Romero BellingSean Ellison, MD  Transparent Dressings (OPSITE IV 3000) MISC Inject 1 Device into the skin 3 days. 04/26/10  Yes Romero BellingSean Ellison, MD  VIT C-CHOLECALCIFEROL-ROSE HIP PO Take by mouth.   Yes Historical Provider, MD  ziprasidone (GEODON) 60 MG capsule Take 120 mg by mouth at bedtime. Take 2 by mouth at bedtime   Yes Historical Provider, MD  Azelastine-Fluticasone (DYMISTA) 137-50 MCG/ACT SUSP Place 2 sprays into both nostrils at bedtime.  05/22/14   Waymon Budge, MD   Past Medical History  Diagnosis Date  . SMOKER 08/11/2009  . CONTUSION, ANKLE 08/28/2007  . SEBACEOUS CYST, INFECTED 07/13/2007  . TINEA VERSICOLOR 07/13/2007  . ALLERGIC RHINITIS CAUSE UNSPECIFIED 03/06/2007  . MIGRAINE, CHRONIC 03/06/2007  . DIABETES MELLITUS, TYPE I 08/03/2006  . DEPRESSION 08/03/2006  . THROMBOCYTOPENIA 05/07/2008  . Encounter for long-term (current) use of other medications   . DM retinopathy   . Bipolar 1 disorder   . Osteoporosis   .  Hyperlipidemia    Past Surgical History  Procedure Laterality Date  . Abnormal u/s  12/10/1996  . Electrocardiogram  06/13/2006  . Tubal ligation     Family History  Problem Relation Age of Onset  . Diabetes Father     (Oral Agents)  . Diabetes Paternal Grandmother   . Hypertension Paternal Grandmother   . Diabetes Paternal Grandfather   . Cancer Paternal Grandfather     colon  . Hypertension Paternal Grandfather    History   Social History  . Marital Status: Widowed    Spouse Name: N/A  . Number of Children: N/A  . Years of Education: N/A   Occupational History  . Not on file.   Social History Main Topics  . Smoking status: Former Smoker    Quit date: 10/16/2010  . Smokeless tobacco: Never Used  . Alcohol Use: No  . Drug Use: No  . Sexual Activity: Yes     Comment: tubal ligation   Other Topics Concern  . Not on file   Social History Narrative   Does not work outside the home   ROS-see HPI   Negative unless "+" Constitutional:    weight loss, night sweats, fevers, chills, fatigue, lassitude. HEENT:    headaches, difficulty swallowing, tooth/dental problems, sore throat,       +sneezing, +itching, +ear ache, nasal congestion, post nasal drip, snoring CV:    chest pain, orthopnea, PND, swelling in lower extremities, anasarca,                                  dizziness, palpitations Resp:   shortness of breath with exertion or at rest.                productive cough,   non-productive cough, coughing up of blood.              change in color of mucus.  wheezing.   Skin:    rash or lesions. GI:  No-   heartburn, indigestion, abdominal pain, nausea, vomiting, diarrhea,                 change in bowel habits, loss of appetite GU: dysuria, change in color of urine, no urgency or frequency.   flank pain. MS:   joint pain, stiffness, decreased range of motion, back pain. Neuro-     nothing unusual Psych:  change in mood or affect.  +depression or +anxiety.   memory  loss.  OBJ- Physical Exam General- Alert, Oriented, Affect-appropriate, Distress- none acute Skin- rash-none, lesions- none, excoriation- none Lymphadenopathy- none Head- atraumatic            Eyes- Gross vision intact, PERRLA, conjunctivae and secretions clear            Ears- Hearing, canals-normal            Nose- Clear, no-Septal dev, mucus, polyps, erosion,  perforation, + prominent turbinates especially on the right but not clearly obstructed.            Throat- Mallampati III , mucosa clear , drainage- none, tonsils- atrophic Neck- flexible , trachea midline, no stridor , thyroid nl, carotid no bruit Chest - symmetrical excursion , unlabored           Heart/CV- RRR , no murmur , no gallop  , no rub, nl s1 s2                           - JVD- none , edema- none, stasis changes- none, varices- none           Lung- clear to P&A, wheeze- none, cough- none , dullness-none, rub- none           Chest wall-  Abd-  Br/ Gen/ Rectal- Not done, not indicated Extrem- cyanosis- none, clubbing, none, atrophy- none, strength- nl Neuro- grossly intact to observation

## 2014-05-22 NOTE — Patient Instructions (Signed)
Order- lab- Allergy Profile    Dx allergic rhinitis  Sample Dymista nasal spray   1-2 puffs each nostril once daily at bedtime. Try this instead of Flonase  You can experiment with how Sudafed PE (otc) with your Zyrtec compares to Zyrtec-D or Zyrtec alone  When you feel sinus trouble coming on, see if it helps to rinse your nose with salt water, like the products I showed you.

## 2014-05-23 DIAGNOSIS — J0191 Acute recurrent sinusitis, unspecified: Secondary | ICD-10-CM | POA: Insufficient documentation

## 2014-05-23 LAB — ALLERGY FULL PROFILE
Allergen,Goose feathers, e70: <0.1 kU/L
Alternaria Alternata: <0.1 kU/L
Aspergillus fumigatus, m3: <0.1 kU/L
Bahia Grass: <0.1 kU/L
Candida Albicans: <0.1 kU/L
Cat Dander: 0.44 kU/L — ABNORMAL HIGH
Common Ragweed: <0.1 kU/L
Curvularia lunata: <0.1 kU/L
D. farinae: <0.1 kU/L
DOG DANDER: 1.05 kU/L — AB
Elm IgE: <0.1 kU/L
Fescue: 0.14 kU/L — ABNORMAL HIGH
G005 RYE, PERENNIAL: 0.13 kU/L — AB
G009 Red Top: 0.13 kU/L — ABNORMAL HIGH
Goldenrod: <0.1 kU/L
HOUSE DUST HOLLISTER: 0.32 kU/L — AB
Helminthosporium halodes: <0.1 kU/L
IGE (IMMUNOGLOBULIN E), SERUM: 19 kU/L (ref <115)
Oak: <0.1 kU/L
Plantain: <0.1 kU/L
Sycamore Tree: <0.1 kU/L
TIMOTHY GRASS: 0.17 kU/L — AB

## 2014-05-23 NOTE — Assessment & Plan Note (Addendum)
Education done. Discussed nasal saline rinsing. Mentions incidental mild wheezing sometimes she has an acute bronchitis. I'm not ready to call this asthma yet. Plan-sample Dymista, lab for allergy profile

## 2014-05-23 NOTE — Assessment & Plan Note (Signed)
She reports frequent diagnosis of acute sinusitis. We can try further documentation including limited CT scan as appropriate. No intervention is indicated at this visit.

## 2014-05-26 ENCOUNTER — Encounter: Payer: Self-pay | Admitting: Endocrinology

## 2014-05-26 ENCOUNTER — Ambulatory Visit (INDEPENDENT_AMBULATORY_CARE_PROVIDER_SITE_OTHER): Payer: BLUE CROSS/BLUE SHIELD | Admitting: Endocrinology

## 2014-05-26 VITALS — BP 130/68 | HR 78 | Temp 98.8°F | Ht 67.0 in | Wt 162.0 lb

## 2014-05-26 DIAGNOSIS — E1010 Type 1 diabetes mellitus with ketoacidosis without coma: Secondary | ICD-10-CM | POA: Diagnosis not present

## 2014-05-26 LAB — HEMOGLOBIN A1C: Hgb A1c MFr Bld: 6.5 % (ref 4.6–6.5)

## 2014-05-26 MED ORDER — INSULIN REGULAR HUMAN 100 UNIT/ML IJ SOLN: INTRAMUSCULAR | Status: DC

## 2014-05-26 NOTE — Patient Instructions (Signed)
Please continue the same pump settings: basal rate of 0.9 units/hr, 24 hrs per day. Suspend it for 1-2 hrs, if you are active.  mealtime bolus of 1 unit/ 13 grams carbohydrate. however, subtract 1 unit from calculated lunch bolus. Add 3 units to calculated breakfast bolus.  correction bolus (which some people call "sensitivity," or "insulin sensitivity ratio," or just "isr") of 1 unit for each 40 by which your blood sugar exceeds 100. blood tests are being requested for you today.  We'll let you know about the results. check your blood sugar 8 times per day, including if you have symptoms of your blood sugar being too high or too low.  please keep a record of the readings and bring it to your next appointment here.  You can write it on any piece of paper.  please call us sooner if your blood sugar goes below 70, or if you have a lot of readings over 200. Please come back for a follow-up appointment in 3-4 months.

## 2014-05-26 NOTE — Progress Notes (Signed)
Subjective:    Patient ID: Molly [name], female    DOB: January 30, 1970, [age] y.o.   MRN: [id number]  HPI Pt returns for f/u of diabetes mellitus: DM type: 1 Dx'ed: 1983 Complications: none Therapy: insulin since dx DKA: last episode was in early 2016--pt says was due to a bad bottle of insulin.  Severe hypoglycemia: last episode was many years ago.  Pancreatitis: never.  Other: she has a paradigm insulin pump; she declines continuous glucose monitor, due to cost Interval history:  Please continue the same pump settings: basal rate of 0.9 units/hr, 24 hrs per day. Suspends it for 1-2 hrs, if she is active.  mealtime bolus of 1 unit/ 13 grams carbohydrate. however, she subtracts 1 unit from calculated lunch bolus. Adds 3 units to calculated breakfast bolus.  correction bolus (which some people call "sensitivity," or "insulin sensitivity ratio," or just "isr") of 1 unit for each 40 by which her cbg exceeds 100. She takes a total of approx 70 units per day.  Since last ov, pt says she has had another episode of severe hyperglycemia, but she noticed it soon enough to prevent another hospital visit.  She attributes it to another bad bottle of insulin.  pt states she feels well in general.  no cbg record, but states cbg's are well-controlled. Past Medical History  Diagnosis Date  . SMOKER 08/11/2009  . CONTUSION, ANKLE 08/28/2007  . SEBACEOUS CYST, INFECTED 07/13/2007  . TINEA VERSICOLOR 07/13/2007  . ALLERGIC RHINITIS CAUSE UNSPECIFIED 03/06/2007  . MIGRAINE, CHRONIC 03/06/2007  . DIABETES MELLITUS, TYPE I 08/03/2006  . DEPRESSION 08/03/2006  . THROMBOCYTOPENIA 05/07/2008  . Encounter for long-term (current) use of other medications   . DM retinopathy   . Bipolar 1 disorder   . Osteoporosis   . Hyperlipidemia     Past Surgical History  Procedure Laterality Date  . Abnormal u/s  12/10/1996  . Electrocardiogram  06/13/2006  . Tubal ligation      History   Social History  .  Marital Status: Widowed    Spouse Name: N/A  . Number of Children: N/A  . Years of Education: N/A   Occupational History  . Not on file.   Social History Main Topics  . Smoking status: Former Smoker    Quit date: 10/16/2010  . Smokeless tobacco: Never Used  . Alcohol Use: No  . Drug Use: No  . Sexual Activity: Yes     Comment: tubal ligation   Other Topics Concern  . Not on file   Social History Narrative   Does not work outside the home    Current Outpatient Prescriptions on File Prior to Visit  Medication Sig Dispense Refill  . Alcohol Swabs (ALCOHOL PREP) 70 % PADS 8/day  250.01 800 each 3  . alendronate (FOSAMAX) 70 MG tablet TAKE 1 TABLET BY MOUTH EVERY 7 DAYS. TAKE WITH FULL GLASS OF WATER ON AN EMPTY STOMACH 4 tablet 3  . Azelastine-Fluticasone (DYMISTA) 137-50 MCG/ACT SUSP Place 2 sprays into both nostrils at bedtime. 1 Bottle 0  . Calcium Carbonate (CALCIUM 600 PO) Take 2 tablets by mouth daily.    . carbamazepine (TEGRETOL) 200 MG tablet Take 400-600 mg by mouth 2 (two) times daily. 2 tablets in the am and 3 tablets in the pm    . cetirizine (ZYRTEC) 10 MG tablet Take 10 mg by mouth daily.    . fluticasone (FLONASE) 50 MCG/ACT nasal spray Place into both nostrils daily.    Marland Kitchen. glucose  blood (FREESTYLE LITE) test strip 8/day dx 250.01, and lancets 800 each 3  . Insulin Infusion Pump Supplies (MINIMED INFUSION SET-MMT 398) MISC Inject 1 Device into the skin 3 days. Change every 3 days 30 each 3  . Insulin Infusion Pump Supplies (PARADIGM RESERVOIR ) MISC Inject 1 Device into the skin 3 days. Change every 3 days 30 each 3  . lamoTRIgine (LAMICTAL) 200 MG tablet Take 200 mg by mouth 2 (two) times daily.     . Lancets (FREESTYLE) lancets Use as instructed dx 250.01 800 each 3  . Multiple Vitamin (MULTIVITAMIN) tablet Take 1 tablet by mouth daily.    . rosuvastatin (CRESTOR) 40 MG tablet Take 1 tablet (40 mg total) by mouth daily. 1 tablet at bedtime 90 tablet 3  .  SUMAtriptan (IMITREX) 100 MG tablet Take 1 tablet by mouth every 2 hrs as needed for headaches. May repeat in 2 hours if headache persists or recurs.  Not to exceed 2/day 20 tablet 5  . Transparent Dressings (OPSITE IV 3000) MISC Inject 1 Device into the skin 3 days. 30 each 3  . VIT C-CHOLECALCIFEROL-ROSE HIP PO Take by mouth.    . ziprasidone (GEODON) 60 MG capsule Take 120 mg by mouth at bedtime. Take 2 by mouth at bedtime     No current facility-administered medications on file prior to visit.    Allergies  Allergen Reactions  . Varenicline Tartrate     REACTION: rash    Family History  Problem Relation Age of Onset  . Diabetes Father     (Oral Agents)  . Diabetes Paternal Grandmother   . Hypertension Paternal Grandmother   . Diabetes Paternal Grandfather   . Cancer Paternal Grandfather     colon  . Hypertension Paternal Grandfather     BP 130/68 mmHg  Pulse 78  Temp(Src) 98.8 F (37.1 C) (Oral)  Ht  (1.702 m)  Wt 162 lb (73.483 kg)  BMI 25.37 kg/m2  SpO2 97%  Review of Systems She has mild hypoglycemia approx 1-2 times per week.  Denies weight change.      Objective:   Physical Exam VITAL SIGNS:  See vs page GENERAL: no distress Pulses: dorsalis pedis intact bilat.   MSK: no deformity of the feet CV: no leg edema Skin:  no ulcer on the feet.  normal color and temp on the feet. Neuro: sensation is intact to touch on the feet.   Lab Results  Component Value Date   HGBA1C 6.5 05/26/2014      Assessment & Plan:  DM: well-controlled  Patient is advised the following: Patient Instructions  Please continue the same pump settings: basal rate of 0.9 units/hr, 24 hrs per day. Suspend it for 1-2 hrs, if you are active.  mealtime bolus of 1 unit/ 13 grams carbohydrate. however, subtract 1 unit from calculated lunch bolus. Add 3 units to calculated breakfast bolus.  correction bolus (which some people call "sensitivity," or "insulin sensitivity  ratio," or just "isr") of 1 unit for each 40 by which your blood sugar exceeds 100. blood tests are being requested for you today.  We'll let you know about the results. check your blood sugar 8 times per day, including if you have symptoms of your blood sugar being too high or too low.  please keep a record of the readings and bring it to your next appointment here.  You can write it on any piece of paper.  please call us sooner if your  blood sugar goes below 70, or if you have a lot of readings over 200. Please come back for a follow-up appointment in 3-4 months.

## 2014-05-28 ENCOUNTER — Telehealth: Payer: Self-pay

## 2014-05-28 MED ORDER — INSULIN REGULAR HUMAN 100 UNIT/ML IJ SOLN: INTRAMUSCULAR | Status: DC

## 2014-05-28 NOTE — Telephone Encounter (Signed)
Received a fax from patients pharmacy advising Humulin is not a covered medication. Alternative is Novolin R. Ok send Rx?

## 2014-05-28 NOTE — Telephone Encounter (Signed)
ok 

## 2014-05-28 NOTE — Telephone Encounter (Signed)
Rx sent 

## 2014-06-02 ENCOUNTER — Ambulatory Visit: Payer: Self-pay | Admitting: Endocrinology

## 2014-07-25 ENCOUNTER — Ambulatory Visit: Payer: BLUE CROSS/BLUE SHIELD | Admitting: Internal Medicine

## 2014-09-03 ENCOUNTER — Encounter: Payer: Self-pay | Admitting: Endocrinology

## 2014-09-03 ENCOUNTER — Ambulatory Visit (INDEPENDENT_AMBULATORY_CARE_PROVIDER_SITE_OTHER): Payer: BLUE CROSS/BLUE SHIELD | Admitting: Endocrinology

## 2014-09-03 ENCOUNTER — Telehealth: Payer: Self-pay | Admitting: Endocrinology

## 2014-09-03 VITALS — BP 128/64 | HR 68 | Temp 98.1°F | Ht 67.0 in | Wt 165.0 lb

## 2014-09-03 DIAGNOSIS — E1010 Type 1 diabetes mellitus with ketoacidosis without coma: Secondary | ICD-10-CM | POA: Diagnosis not present

## 2014-09-03 MED ORDER — TRIAMCINOLONE ACETONIDE 0.1 % EX CREA: 1.0000 "application " | TOPICAL_CREAM | Freq: Four times a day (QID) | CUTANEOUS | Status: DC

## 2014-09-03 MED ORDER — INSULIN REGULAR HUMAN 100 UNIT/ML IJ SOLN: INTRAMUSCULAR | Status: DC

## 2014-09-03 NOTE — Telephone Encounter (Signed)
Rx has been sent per pt's request.  

## 2014-09-03 NOTE — Patient Instructions (Addendum)
Please come back for a regular physical appointment in 3-4 months.  Please continue the same pump settings: basal rate of 0.9 units/hr, 24 hrs per day. Suspend it for 1-2 hrs, if you are active.  mealtime bolus of 1 unit/ 13 grams carbohydrate. however, subtract 1 unit from calculated lunch bolus. Add 3 units to calculated breakfast bolus.  correction bolus (which some people call "sensitivity," or "insulin sensitivity ratio," or just "isr") of 1 unit for each 40 by which your blood sugar exceeds 100. blood tests are being requested for you today.  We'll let you know about the results. check your blood sugar 8 times per day, including if you have symptoms of your blood sugar being too high or too low.  please keep a record of the readings and bring it to your next appointment here.  You can write it on any piece of paper.  please call us sooner if your blood sugar goes below 70, or if you have a lot of readings over 200.  i have sent a prescription to your pharmacy, for the rash.

## 2014-09-03 NOTE — Progress Notes (Signed)
Subjective:    Patient ID: Molly [name], female    DOB: 05-Aug-1970, 44 y.o.   MRN: [id number]  HPI Pt returns for f/u of diabetes mellitus: DM type: 1 Dx'ed: 1983 Complications: none Therapy: insulin since dx DKA: last episode was in early 2016--pt says was due to a bad bottle of insulin.  Severe hypoglycemia: last episode was many years ago.  Pancreatitis: never.  Other: she has a paradigm insulin pump; she declines continuous glucose monitor, due to cost Interval history:  Please continue the same pump settings: basal rate of 0.9 units/hr, 24 hrs per day. Suspends it for 1-2 hrs, if she is active.  mealtime bolus of 1 unit/ 13 grams carbohydrate. however, she subtracts 1 unit from calculated lunch bolus. Adds 3 units to calculated breakfast bolus.  correction bolus (which some people call "sensitivity," or "insulin sensitivity ratio," or just "isr") of 1 unit for each 40 by which her cbg exceeds 100. She takes a total of approx 70-100 units per day.  No recent insulin problems.  she brings a record of her cbg's which i have reviewed today.  Almost all are in the low to mid-100's.  She seldom has hypoglycemia, and these are mild. Pt states few weeks of moderate itching of the right scapular area, and assoc rash.   Past Medical History  Diagnosis Date  . SMOKER 08/11/2009  . CONTUSION, ANKLE 08/28/2007  . SEBACEOUS CYST, INFECTED 07/13/2007  . TINEA VERSICOLOR 07/13/2007  . ALLERGIC RHINITIS CAUSE UNSPECIFIED 03/06/2007  . MIGRAINE, CHRONIC 03/06/2007  . DIABETES MELLITUS, TYPE I 08/03/2006  . DEPRESSION 08/03/2006  . THROMBOCYTOPENIA 05/07/2008  . Encounter for long-term (current) use of other medications   . DM retinopathy   . Bipolar 1 disorder   . Osteoporosis   . Hyperlipidemia     Past Surgical History  Procedure Laterality Date  . Abnormal u/s  12/10/1996  . Electrocardiogram  06/13/2006  . Tubal ligation      Social History   Social History  . Marital  Status: Widowed    Spouse Name: N/A  . Number of Children: N/A  . Years of Education: N/A   Occupational History  . Not on file.   Social History Main Topics  . Smoking status: Former Smoker    Quit date: 10/16/2010  . Smokeless tobacco: Never Used  . Alcohol Use: No  . Drug Use: No  . Sexual Activity: Yes     Comment: tubal ligation   Other Topics Concern  . Not on file   Social History Narrative   Does not work outside the home    Current Outpatient Prescriptions on File Prior to Visit  Medication Sig Dispense Refill  . Alcohol Swabs (ALCOHOL PREP) 70 % PADS 8/day  250.01 800 each 3  . alendronate (FOSAMAX) 70 MG tablet TAKE 1 TABLET BY MOUTH EVERY 7 DAYS. TAKE WITH FULL GLASS OF WATER ON AN EMPTY STOMACH 4 tablet 3  . Azelastine-Fluticasone (DYMISTA) 137-50 MCG/ACT SUSP Place 2 sprays into both nostrils at bedtime. 1 Bottle 0  . Calcium Carbonate (CALCIUM 600 PO) Take 2 tablets by mouth daily.    . carbamazepine (TEGRETOL) 200 MG tablet Take 400-600 mg by mouth 2 (two) times daily. 2 tablets in the am and 3 tablets in the pm    . cetirizine (ZYRTEC) 10 MG tablet Take 10 mg by mouth daily.    . fluticasone (FLONASE) 50 MCG/ACT nasal spray Place into both nostrils daily.    Marland Kitchen  glucose blood (FREESTYLE LITE) test strip 8/day dx 250.01, and lancets 800 each 3  . Insulin Infusion Pump Supplies (MINIMED INFUSION SET-MMT 398) MISC Inject 1 Device into the skin 3 days. Change every 3 days 30 each 3  . Insulin Infusion Pump Supplies (PARADIGM RESERVOIR ) MISC Inject 1 Device into the skin 3 days. Change every 3 days 30 each 3  . lamoTRIgine (LAMICTAL) 200 MG tablet Take 200 mg by mouth 2 (two) times daily.     . Lancets (FREESTYLE) lancets Use as instructed dx 250.01 800 each 3  . Multiple Vitamin (MULTIVITAMIN) tablet Take 1 tablet by mouth daily.    . rosuvastatin (CRESTOR) 40 MG tablet Take 1 tablet (40 mg total) by mouth daily. 1 tablet at bedtime 90 tablet 3  . SUMAtriptan  (IMITREX) 100 MG tablet Take 1 tablet by mouth every 2 hrs as needed for headaches. May repeat in 2 hours if headache persists or recurs.  Not to exceed 2/day 20 tablet 5  . Transparent Dressings (OPSITE IV 3000) MISC Inject 1 Device into the skin 3 days. 30 each 3  . VIT C-CHOLECALCIFEROL-ROSE HIP PO Take by mouth.    . ziprasidone (GEODON) 60 MG capsule Take 120 mg by mouth at bedtime. Take 2 by mouth at bedtime     No current facility-administered medications on file prior to visit.    Allergies  Allergen Reactions  . Varenicline Tartrate     REACTION: rash    Family History  Problem Relation Age of Onset  . Diabetes Father     (Oral Agents)  . Diabetes Paternal Grandmother   . Hypertension Paternal Grandmother   . Diabetes Paternal Grandfather   . Cancer Paternal Grandfather     colon  . Hypertension Paternal Grandfather     BP 128/64 mmHg  Pulse 68  Temp(Src) 98.1 F (36.7 C) (Oral)  Ht 5\' 7"  (1.702 m)  Wt 165 lb (74.844 kg)  BMI 25.84 kg/m2  SpO2 97%  Review of Systems Denies LOC    Objective:   Physical Exam VITAL SIGNS:  See vs page GENERAL: no distress Pulses: dorsalis pedis intact bilat.   MSK: no deformity of the feet CV: no leg edema Skin:  no ulcer on the feet.  normal color and temp on the feet.  On the right scapular area, there is a 6 cm area of eczematous skin.  At the right flank, there is a 1 cm diameter skin tag.    Neuro: sensation is intact to touch on the feet  Skin tag removal: consent obtained, signed form on chart The area is first sprayed with cooling agent local: xylocaine 2%, with epinephrine prep: alcohol pad The tag is rescected with a #15 blade, and discarded.   no complications     Assessment & Plan:  DM: well-controlled Rash, new, uncertain etiology Skin tag, s/p resection  Patient is advised the following: Patient Instructions  Please come back for a regular physical appointment in 3-4 months.  Please continue the  same pump settings: basal rate of 0.9 units/hr, 24 hrs per day. Suspend it for 1-2 hrs, if you are active.  mealtime bolus of 1 unit/ 13 grams carbohydrate. however, subtract 1 unit from calculated lunch bolus. Add 3 units to calculated breakfast bolus.  correction bolus (which some people call "sensitivity," or "insulin sensitivity ratio," or just "isr") of 1 unit for each 40 by which your blood sugar exceeds 100. blood tests are being requested for you  today.  We'll let you know about the results. check your blood sugar 8 times per day, including if you have symptoms of your blood sugar being too high or too low.  please keep a record of the readings and bring it to your next appointment here.  You can write it on any piece of paper.  please call us sooner if your blood sugar goes below 70, or if you have a [name] of readings over 200.  i have sent a prescription to your pharmacy, for the rash.

## 2014-09-03 NOTE — Telephone Encounter (Signed)
Please call in the [facility] brand insulin regular 3 mon supply 7200 u daily at least 7 bottles called into [facility] [address]

## 2014-09-10 ENCOUNTER — Telehealth: Payer: Self-pay | Admitting: Endocrinology

## 2014-09-10 NOTE — Telephone Encounter (Signed)
[facility] on [address] please call in pts nausea meds

## 2014-09-11 ENCOUNTER — Other Ambulatory Visit: Payer: Self-pay | Admitting: Endocrinology

## 2014-09-11 MED ORDER — ONDANSETRON HCL 4 MG PO TABS: 4.0000 mg | ORAL_TABLET | Freq: Three times a day (TID) | ORAL | Status: AC | PRN

## 2014-09-11 NOTE — Telephone Encounter (Signed)
See note below and please advise, Thanks! 

## 2014-09-11 NOTE — Telephone Encounter (Signed)
Ok, i have sent a prescription to your pharmacy  

## 2014-09-15 ENCOUNTER — Ambulatory Visit: Payer: BLUE CROSS/BLUE SHIELD | Admitting: Endocrinology

## 2015-01-02 ENCOUNTER — Encounter: Payer: Self-pay | Admitting: Endocrinology

## 2015-01-02 ENCOUNTER — Ambulatory Visit (INDEPENDENT_AMBULATORY_CARE_PROVIDER_SITE_OTHER): Payer: BLUE CROSS/BLUE SHIELD | Admitting: Endocrinology

## 2015-01-02 VITALS — BP 128/68 | HR 86 | Temp 98.3°F | Ht 67.0 in | Wt 165.0 lb

## 2015-01-02 DIAGNOSIS — Z0000 Encounter for general adult medical examination without abnormal findings: Secondary | ICD-10-CM

## 2015-01-02 DIAGNOSIS — E1010 Type 1 diabetes mellitus with ketoacidosis without coma: Secondary | ICD-10-CM | POA: Diagnosis not present

## 2015-01-02 LAB — URINALYSIS, ROUTINE W REFLEX MICROSCOPIC
BILIRUBIN URINE: NEGATIVE
HGB URINE DIPSTICK: NEGATIVE
Ketones, ur: NEGATIVE
Leukocytes, UA: NEGATIVE
Nitrite: NEGATIVE
Specific Gravity, Urine: <=1.005 — AB (ref 1.000–1.030)
Total Protein, Urine: NEGATIVE
Urine Glucose: >=1000 — AB
Urobilinogen, UA: 0.2 (ref 0.0–1.0)
pH: 6 (ref 5.0–8.0)

## 2015-01-02 LAB — HEPATIC FUNCTION PANEL
ALT: 25 U/L (ref 0–35)
AST: 25 U/L (ref 0–37)
Albumin: 3.8 g/dL (ref 3.5–5.2)
Alkaline Phosphatase: 40 U/L (ref 39–117)
BILIRUBIN DIRECT: 0.1 mg/dL (ref 0.0–0.3)
BILIRUBIN TOTAL: 0.3 mg/dL (ref 0.2–1.2)
Total Protein: 5.9 g/dL — ABNORMAL LOW (ref 6.0–8.3)

## 2015-01-02 LAB — CBC WITH DIFFERENTIAL/PLATELET
BASOS PCT: 0.5 % (ref 0.0–3.0)
Basophils Absolute: 0 10*3/uL (ref 0.0–0.1)
EOS ABS: 0 10*3/uL (ref 0.0–0.7)
EOS PCT: 0.7 % (ref 0.0–5.0)
HEMATOCRIT: 39.6 % (ref 36.0–46.0)
Hemoglobin: 13.2 g/dL (ref 12.0–15.0)
LYMPHS PCT: 21.4 % (ref 12.0–46.0)
Lymphs Abs: 0.7 10*3/uL (ref 0.7–4.0)
MCHC: 33.3 g/dL (ref 30.0–36.0)
MCV: 94.1 fl (ref 78.0–100.0)
MONOS PCT: 10.7 % (ref 3.0–12.0)
Monocytes Absolute: 0.3 10*3/uL (ref 0.1–1.0)
NEUTROS ABS: 2.1 10*3/uL (ref 1.4–7.7)
Neutrophils Relative %: 66.7 % (ref 43.0–77.0)
Platelets: 110 10*3/uL — ABNORMAL LOW (ref 150.0–400.0)
RBC: 4.21 Mil/uL (ref 3.87–5.11)
RDW: 12.9 % (ref 11.5–15.5)
WBC: 3.2 10*3/uL — ABNORMAL LOW (ref 4.0–10.5)

## 2015-01-02 LAB — BASIC METABOLIC PANEL
BUN: 7 mg/dL (ref 6–23)
CO2: 26 meq/L (ref 19–32)
Calcium: 8.5 mg/dL (ref 8.4–10.5)
Chloride: 105 mEq/L (ref 96–112)
Creatinine, Ser: 0.72 mg/dL (ref 0.40–1.20)
GFR: 93 mL/min (ref >60.00)
Glucose, Bld: 338 mg/dL — ABNORMAL HIGH (ref 70–99)
POTASSIUM: 4.4 meq/L (ref 3.5–5.1)
SODIUM: 136 meq/L (ref 135–145)

## 2015-01-02 LAB — MICROALBUMIN / CREATININE URINE RATIO
Creatinine,U: 40.2 mg/dL
MICROALB/CREAT RATIO: 1.7 mg/g (ref 0.0–30.0)
Microalb, Ur: <0.7 mg/dL (ref 0.0–1.9)

## 2015-01-02 LAB — LIPID PANEL
Cholesterol: 131 mg/dL (ref 0–200)
HDL: 57.6 mg/dL (ref >39.00)
LDL Cholesterol: 64 mg/dL (ref 0–99)
NonHDL: 73.72
Total CHOL/HDL Ratio: 2
Triglycerides: 48 mg/dL (ref 0.0–149.0)
VLDL: 9.6 mg/dL (ref 0.0–40.0)

## 2015-01-02 LAB — POCT GLYCOSYLATED HEMOGLOBIN (HGB A1C): HEMOGLOBIN A1C: 7.3

## 2015-01-02 LAB — TSH: TSH: 3.54 u[IU]/mL (ref 0.35–4.50)

## 2015-01-02 MED ORDER — TERBINAFINE HCL 250 MG PO TABS: 250.0000 mg | ORAL_TABLET | Freq: Every day | ORAL | Status: DC

## 2015-01-02 MED ORDER — ALENDRONATE SODIUM 70 MG PO TABS: ORAL_TABLET | ORAL | Status: DC

## 2015-01-02 MED ORDER — FLUTICASONE PROPIONATE (INHAL) 50 MCG/BLIST IN AEPB
1.0000 | INHALATION_SPRAY | Freq: Two times a day (BID) | RESPIRATORY_TRACT | Status: DC
Start: 2015-01-02 — End: 2016-01-30

## 2015-01-02 NOTE — Progress Notes (Signed)
Subjective:    Patient ID: Molly [name], female    DOB: 07-Sep-1970, 44 y.o.   MRN: [id number]  HPI Pt returns for f/u of diabetes mellitus: DM type: 1 Dx'ed: 1983 Complications: none Therapy: insulin since dx DKA: last episode was in early 2016--pt says was due to a bad bottle of insulin.  Severe hypoglycemia: last episode was many years ago.  Pancreatitis: never.  Other: she has a paradigm insulin pump; she declines continuous glucose monitor, due to cost Interval history:  She takes these pump settings: basal rate of 0.9 units/hr, 24 hrs per day. Suspends it for 1-2 hrs, if she is active.  mealtime bolus of 1 unit/ 13 grams carbohydrate. however, she subtracts 1 unit from calculated lunch bolus. Adds 3 units to calculated breakfast bolus.  correction bolus (which some people call "sensitivity," or "insulin sensitivity ratio," or just "isr") of 1 unit for each 40 by which her cbg exceeds 100. She takes a total of approx 70-100 units per day.  she brings a record of her cbg's which i have reviewed today.  It varies from 80-200's.  There is no trend throughout the day.  She seldom has hypoglycemia, and these are mild.   Pt states few mos of intermittent "congestion" in the chest, and slight assoc wheezing.   Past Medical History  Diagnosis Date  . SMOKER 08/11/2009  . CONTUSION, ANKLE 08/28/2007  . SEBACEOUS CYST, INFECTED 07/13/2007  . TINEA VERSICOLOR 07/13/2007  . ALLERGIC RHINITIS CAUSE UNSPECIFIED 03/06/2007  . MIGRAINE, CHRONIC 03/06/2007  . DIABETES MELLITUS, TYPE I 08/03/2006  . DEPRESSION 08/03/2006  . THROMBOCYTOPENIA 05/07/2008  . Encounter for long-term (current) use of other medications   . DM retinopathy (HCC)   . Bipolar 1 disorder (HCC)   . Osteoporosis   . Hyperlipidemia     Past Surgical History  Procedure Laterality Date  . Abnormal u/s  12/10/1996  . Electrocardiogram  06/13/2006  . Tubal ligation      Social History   Social History  . Marital  Status: Widowed    Spouse Name: N/A  . Number of Children: N/A  . Years of Education: N/A   Occupational History  . Not on file.   Social History Main Topics  . Smoking status: Former Smoker    Quit date: 10/16/2010  . Smokeless tobacco: Never Used  . Alcohol Use: No  . Drug Use: No  . Sexual Activity: Yes     Comment: tubal ligation   Other Topics Concern  . Not on file   Social History Narrative   Does not work outside the home    Current Outpatient Prescriptions on File Prior to Visit  Medication Sig Dispense Refill  . Alcohol Swabs (ALCOHOL PREP) 70 % PADS 8/day  250.01 800 each 3  . Calcium Carbonate (CALCIUM 600 PO) Take 2 tablets by mouth daily.    . carbamazepine (TEGRETOL) 200 MG tablet Take 400-600 mg by mouth 2 (two) times daily. 2 tablets in the am and 3 tablets in the pm    . cetirizine (ZYRTEC) 10 MG tablet Take 10 mg by mouth daily.    . fluticasone (FLONASE) 50 MCG/ACT nasal spray Place into both nostrils daily.    Marland Kitchen. glucose blood (FREESTYLE LITE) test strip 8/day dx 250.01, and lancets 800 each 3  . Insulin Infusion Pump Supplies (MINIMED INFUSION SET-MMT 398) MISC Inject 1 Device into the skin 3 days. Change every 3 days 30 each 3  . Insulin  Infusion Pump Supplies (PARADIGM RESERVOIR ) MISC Inject 1 Device into the skin 3 days. Change every 3 days 30 each 3  . insulin regular (NOVOLIN R) 100 units/mL injection Use in insulin pump--total of 100 units per day. 70 mL 2  . lamoTRIgine (LAMICTAL) 200 MG tablet Take 200 mg by mouth 2 (two) times daily.     . Lancets (FREESTYLE) lancets Use as instructed dx 250.01 800 each 3  . Multiple Vitamin (MULTIVITAMIN) tablet Take 1 tablet by mouth daily.    . rosuvastatin (CRESTOR) 40 MG tablet Take 1 tablet (40 mg total) by mouth daily. 1 tablet at bedtime 90 tablet 3  . SUMAtriptan (IMITREX) 100 MG tablet Take 1 tablet by mouth every 2 hrs as needed for headaches. May repeat in 2 hours if headache persists or recurs.   Not to exceed 2/day 20 tablet 5  . Transparent Dressings (OPSITE IV 3000) MISC Inject 1 Device into the skin 3 days. 30 each 3  . triamcinolone cream (KENALOG) 0.1 % Apply 1 application topically 4 (four) times daily. As needed for rash or itching 45 g 1  . VIT C-CHOLECALCIFEROL-ROSE HIP PO Take by mouth.    . ziprasidone (GEODON) 60 MG capsule Take 120 mg by mouth at bedtime. Take 2 by mouth at bedtime     No current facility-administered medications on file prior to visit.    Allergies  Allergen Reactions  . Varenicline Tartrate     REACTION: rash    Family History  Problem Relation Age of Onset  . Diabetes Father     (Oral Agents)  . Diabetes Paternal Grandmother   . Hypertension Paternal Grandmother   . Diabetes Paternal Grandfather   . Cancer Paternal Grandfather     colon  . Hypertension Paternal Grandfather     BP 128/68 mmHg  Pulse 86  Temp(Src) 98.3 F (36.8 C) (Oral)  Ht  (1.702 m)  Wt 165 lb (74.844 kg)  BMI 25.84 kg/m2  SpO2 98%  Review of Systems Denies fever and hypoglycemia    Objective:   Physical Exam VITAL SIGNS:  See vs page GENERAL: no distress LUNGS:  Clear to auscultation Pulses: dorsalis pedis intact bilat.   MSK: no deformity of the feet CV: no leg edema Skin:  no ulcer on the feet.  normal color and temp on the feet. Neuro: sensation is intact to touch on the feet There is onychomycosis of the left great toenail  i personally reviewed electrocardiogram tracing (today): Indication: DM Impression: normal     Assessment & Plan:  DM: mild exacerbation Cough, prob allergic Onychomycosis, new.   Patient is advised the following: Patient Instructions  i have sent 2 prescriptions to your pharmacy, for a steroid inhaler, and the toenail fungus. blood tests are requested for you today.  We'll let you know about the results. Please come back for a follow-up appointment in 3 months Please continue the same pump settings: basal rate  of 0.9 units/hr, 24 hrs per day. Suspend it for 1-2 hrs, if you are active.  mealtime bolus of 1 unit/ 13 grams carbohydrate. however, subtract 1 unit from calculated lunch bolus. Add 3 units to calculated breakfast bolus.  correction bolus (which some people call "sensitivity," or "insulin sensitivity ratio," or just "isr") of 1 unit for each 40 by which your blood sugar exceeds 100.  check your blood sugar 8 times per day, including if you have symptoms of your blood sugar being too high or  too low.  please keep a record of the readings and bring it to your next appointment here.  You can write it on any piece of paper.  please call us sooner if your blood sugar goes below 70, or if you have a lot of readings over 200.

## 2015-01-02 NOTE — Patient Instructions (Addendum)
i have sent 2 prescriptions to your pharmacy, for a steroid inhaler, and the toenail fungus. blood tests are requested for you today.  We'll let you know about the results. Please come back for a follow-up appointment in 3 months Please continue the same pump settings: basal rate of 0.9 units/hr, 24 hrs per day. Suspend it for 1-2 hrs, if you are active.  mealtime bolus of 1 unit/ 13 grams carbohydrate. however, subtract 1 unit from calculated lunch bolus. Add 3 units to calculated breakfast bolus.  correction bolus (which some people call "sensitivity," or "insulin sensitivity ratio," or just "isr") of 1 unit for each 40 by which your blood sugar exceeds 100.  check your blood sugar 8 times per day, including if you have symptoms of your blood sugar being too high or too low.  please keep a record of the readings and bring it to your next appointment here.  You can write it on any piece of paper.  please call us sooner if your blood sugar goes below 70, or if you have a lot of readings over 200.

## 2015-03-02 ENCOUNTER — Ambulatory Visit (INDEPENDENT_AMBULATORY_CARE_PROVIDER_SITE_OTHER): Payer: BLUE CROSS/BLUE SHIELD | Admitting: Endocrinology

## 2015-03-02 ENCOUNTER — Encounter: Payer: Self-pay | Admitting: Endocrinology

## 2015-03-02 VITALS — BP 132/66 | HR 78 | Temp 98.8°F | Ht 67.0 in | Wt 168.0 lb

## 2015-03-02 DIAGNOSIS — N911 Secondary amenorrhea: Secondary | ICD-10-CM | POA: Diagnosis not present

## 2015-03-02 LAB — FOLLICLE STIMULATING HORMONE: FSH: 21.9 m[IU]/mL

## 2015-03-02 LAB — LUTEINIZING HORMONE: LH: 7.57 m[IU]/mL

## 2015-03-02 MED ORDER — CEFUROXIME AXETIL 250 MG PO TABS: 250.0000 mg | ORAL_TABLET | Freq: Two times a day (BID) | ORAL | Status: AC

## 2015-03-02 NOTE — Patient Instructions (Addendum)
i have sent a prescription to your pharmacy, for an antibiotic pill.  zyrtec-d (non-prescription) will help your congestion.  I hope you feel better soon.  If you don't feel better by next week, please call back.  Please call sooner if you get worse.  blood tests are requested for you today.  We'll let you know about the results.

## 2015-03-02 NOTE — Progress Notes (Signed)
Subjective:    Patient ID: Molly [name], female    DOB: 27-Jul-1970, 45 y.o.   MRN: [id number]  HPI Pt states 2 weeks of congestion in the nose, and slight assoc lightheadedness.   Pt also reports emotional lability, but no assoc SI.  She had no menses x 4 mos last year, but they resumed 4 mos ago.  Past Medical History  Diagnosis Date  . SMOKER 08/11/2009  . CONTUSION, ANKLE 08/28/2007  . SEBACEOUS CYST, INFECTED 07/13/2007  . TINEA VERSICOLOR 07/13/2007  . ALLERGIC RHINITIS CAUSE UNSPECIFIED 03/06/2007  . MIGRAINE, CHRONIC 03/06/2007  . DIABETES MELLITUS, TYPE I 08/03/2006  . DEPRESSION 08/03/2006  . THROMBOCYTOPENIA 05/07/2008  . Encounter for long-term (current) use of other medications   . DM retinopathy (HCC)   . Bipolar 1 disorder (HCC)   . Osteoporosis   . Hyperlipidemia     Past Surgical History  Procedure Laterality Date  . Abnormal u/s  12/10/1996  . Electrocardiogram  06/13/2006  . Tubal ligation      Social History   Social History  . Marital Status: Widowed    Spouse Name: N/A  . Number of Children: N/A  . Years of Education: N/A   Occupational History  . Not on file.   Social History Main Topics  . Smoking status: Former Smoker    Quit date: 10/16/2010  . Smokeless tobacco: Never Used  . Alcohol Use: No  . Drug Use: No  . Sexual Activity: Yes     Comment: tubal ligation   Other Topics Concern  . Not on file   Social History Narrative   Does not work outside the home    Current Outpatient Prescriptions on File Prior to Visit  Medication Sig Dispense Refill  . Alcohol Swabs (ALCOHOL PREP) 70 % PADS 8/day  250.01 800 each 3  . alendronate (FOSAMAX) 70 MG tablet TAKE 1 TABLET BY MOUTH EVERY 7 DAYS. TAKE WITH FULL GLASS OF WATER ON AN EMPTY STOMACH 4 tablet 11  . Calcium Carbonate (CALCIUM 600 PO) Take 2 tablets by mouth daily.    . carbamazepine (TEGRETOL) 200 MG tablet Take 400-600 mg by mouth 2 (two) times daily. 2 tablets in the am and 3  tablets in the pm    . cetirizine (ZYRTEC) 10 MG tablet Take 10 mg by mouth daily.    . fluticasone (FLONASE) 50 MCG/ACT nasal spray Place into both nostrils daily.    . fluticasone (FLOVENT DISKUS) 50 MCG/BLIST diskus inhaler Inhale 1 puff into the lungs 2 (two) times daily. 1 Inhaler 12  . glucose blood (FREESTYLE LITE) test strip 8/day dx 250.01, and lancets 800 each 3  . Insulin Infusion Pump Supplies (MINIMED INFUSION SET-MMT 398) MISC Inject 1 Device into the skin 3 days. Change every 3 days 30 each 3  . Insulin Infusion Pump Supplies (PARADIGM RESERVOIR ) MISC Inject 1 Device into the skin 3 days. Change every 3 days 30 each 3  . insulin regular (NOVOLIN R) 100 units/mL injection Use in insulin pump--total of 100 units per day. 70 mL 2  . lamoTRIgine (LAMICTAL) 200 MG tablet Take 200 mg by mouth 2 (two) times daily.     . Lancets (FREESTYLE) lancets Use as instructed dx 250.01 800 each 3  . Multiple Vitamin (MULTIVITAMIN) tablet Take 1 tablet by mouth daily.    . rosuvastatin (CRESTOR) 40 MG tablet Take 1 tablet (40 mg total) by mouth daily. 1 tablet at bedtime 90 tablet 3  .  SUMAtriptan (IMITREX) 100 MG tablet Take 1 tablet by mouth every 2 hrs as needed for headaches. May repeat in 2 hours if headache persists or recurs.  Not to exceed 2/day 20 tablet 5  . terbinafine (LAMISIL) 250 MG tablet Take 1 tablet (250 mg total) by mouth daily. 90 tablet 0  . Transparent Dressings (OPSITE IV 3000) MISC Inject 1 Device into the skin 3 days. 30 each 3  . VIT C-CHOLECALCIFEROL-ROSE HIP PO Take by mouth.    . ziprasidone (GEODON) 60 MG capsule Take 120 mg by mouth at bedtime. Take 2 by mouth at bedtime     No current facility-administered medications on file prior to visit.    Allergies  Allergen Reactions  . Varenicline Tartrate     REACTION: rash    Family History  Problem Relation Age of Onset  . Diabetes Father     (Oral Agents)  . Diabetes Paternal Grandmother   . Hypertension  Paternal Grandmother   . Diabetes Paternal Grandfather   . Cancer Paternal Grandfather     colon  . Hypertension Paternal Grandfather     BP 132/66 mmHg  Pulse 78  Temp(Src) 98.8 F (37.1 C) (Oral)  Ht  (1.702 m)  Wt 168 lb (76.204 kg)  BMI 26.31 kg/m2  SpO2 98%  Review of Systems Denies fever, cough, and earache.    Objective:   Physical Exam VITAL SIGNS:  See vs page GENERAL: no distress head: no deformity eyes: no periorbital swelling, no proptosis external nose and ears are normal mouth: no lesion seen.  Both tm's are red LUNGS:  Clear to auscultation.       Assessment & Plan:  URI, new.  Reduced frequency of menstruation.  She may be developing menopause.  She requests labs.   Patient is advised the following: Patient Instructions  i have sent a prescription to your pharmacy, for an antibiotic pill.  zyrtec-d (non-prescription) will help your congestion.  I hope you feel better soon.  If you don't feel better by next week, please call back.  Please call sooner if you get worse.  blood tests are requested for you today.  We'll let you know about the results.

## 2015-03-10 ENCOUNTER — Other Ambulatory Visit: Payer: Self-pay | Admitting: Endocrinology

## 2015-03-13 ENCOUNTER — Other Ambulatory Visit: Payer: Self-pay

## 2015-03-13 MED ORDER — ROSUVASTATIN CALCIUM 40 MG PO TABS: ORAL_TABLET | ORAL | Status: DC

## 2015-03-29 ENCOUNTER — Other Ambulatory Visit: Payer: Self-pay | Admitting: Endocrinology

## 2015-04-01 ENCOUNTER — Ambulatory Visit: Payer: BLUE CROSS/BLUE SHIELD | Admitting: Endocrinology

## 2015-04-23 ENCOUNTER — Telehealth: Payer: Self-pay

## 2015-04-23 NOTE — Telephone Encounter (Signed)
Left message for patient to return call regarding flu shot call/survey 

## 2015-06-01 ENCOUNTER — Ambulatory Visit (INDEPENDENT_AMBULATORY_CARE_PROVIDER_SITE_OTHER): Payer: BLUE CROSS/BLUE SHIELD | Admitting: Endocrinology

## 2015-06-01 ENCOUNTER — Encounter: Payer: Self-pay | Admitting: Endocrinology

## 2015-06-01 VITALS — BP 112/70 | HR 67 | Wt 169.4 lb

## 2015-06-01 DIAGNOSIS — E1011 Type 1 diabetes mellitus with ketoacidosis with coma: Secondary | ICD-10-CM | POA: Diagnosis not present

## 2015-06-01 LAB — POCT GLYCOSYLATED HEMOGLOBIN (HGB A1C): Hemoglobin A1C: 7

## 2015-06-01 MED ORDER — GLUCOSE BLOOD VI STRP: ORAL_STRIP | Status: AC

## 2015-06-01 MED ORDER — PARADIGM PUMP RESERVOIR 3ML MISC: 1.0000 | Status: DC

## 2015-06-01 MED ORDER — "PARADIGM QUICK-SET 43"" 6MM MISC": 1.0000 | Status: DC

## 2015-06-01 NOTE — Patient Instructions (Addendum)
Please come back for a regular physical appointment in 4 months.   Please see Dr Evelene CroonKaur about the insomnia.   Please continue the same pump settings:  basal rate of 0.9 units/hr, 24 hrs per day. Suspend it for 1-2 hrs, if you are active.  mealtime bolus of 1 unit/ 13 grams carbohydrate. however, subtract 1 unit from calculated lunch bolus. Add 3 units to calculated breakfast bolus.  correction bolus (which some people call "sensitivity," or "insulin sensitivity ratio," or just "isr") of 1 unit for each 40 by which your blood sugar exceeds 100.  check your blood sugar 8 times per day, including if you have symptoms of your blood sugar being too high or too low.  please keep a record of the readings and bring it to your next appointment here.  You can write it on any piece of paper.  please call us sooner if your blood sugar goes below 70, or if you have a lot of readings over 200.

## 2015-06-01 NOTE — Progress Notes (Signed)
Subjective:    Patient ID: Molly [name], female    DOB: Apr 09, 1970, 45 y.o.   MRN: [id number]  HPI Pt returns for f/u of diabetes mellitus: DM type: 1 Dx'ed: 1983 Complications: none Therapy: insulin since dx DKA: last episode was in early 2016--pt says was due to a bad bottle of insulin.  Severe hypoglycemia: last episode was many years ago.  Pancreatitis: never.  Other: she has a paradigm insulin pump; she declines continuous glucose monitor, due to cost Interval history:  She takes these pump settings: basal rate of 0.9 units/hr, 24 hrs per day. Suspends it for 1-2 hrs, if she is active.  mealtime bolus of 1 unit/ 13 grams carbohydrate. however, she subtracts 1 unit from calculated lunch bolus. Adds 3 units to calculated breakfast bolus.  correction bolus (which some people call "sensitivity," or "insulin sensitivity ratio," or just "isr") of 1 unit for each 40 by which her cbg exceeds 100.  She takes a total of approx 70-100 units per day. She seldom has hypoglycemia, and these are mild.  she brings a record of her cbg's which i have reviewed today.  It varies from 65-400, but most are in the 100's.  There is no trend throughout the day.  She is unable to cite any reason for the fluctuations.   Past Medical History  Diagnosis Date  . SMOKER 08/11/2009  . CONTUSION, ANKLE 08/28/2007  . SEBACEOUS CYST, INFECTED 07/13/2007  . TINEA VERSICOLOR 07/13/2007  . ALLERGIC RHINITIS CAUSE UNSPECIFIED 03/06/2007  . MIGRAINE, CHRONIC 03/06/2007  . DIABETES MELLITUS, TYPE I 08/03/2006  . DEPRESSION 08/03/2006  . THROMBOCYTOPENIA 05/07/2008  . Encounter for long-term (current) use of other medications   . DM retinopathy (HCC)   . Bipolar 1 disorder (HCC)   . Osteoporosis   . Hyperlipidemia     Past Surgical History  Procedure Laterality Date  . Abnormal u/s  12/10/1996  . Electrocardiogram  06/13/2006  . Tubal ligation      Social History   Social History  . Marital Status:  Widowed    Spouse Name: N/A  . Number of Children: N/A  . Years of Education: N/A   Occupational History  . Not on file.   Social History Main Topics  . Smoking status: Former Smoker    Quit date: 10/16/2010  . Smokeless tobacco: Never Used  . Alcohol Use: No  . Drug Use: No  . Sexual Activity: Yes     Comment: tubal ligation   Other Topics Concern  . Not on file   Social History Narrative   Does not work outside the home    Current Outpatient Prescriptions on File Prior to Visit  Medication Sig Dispense Refill  . Alcohol Swabs (ALCOHOL PREP) 70 % PADS 8/day  250.01 800 each 3  . alendronate (FOSAMAX) 70 MG tablet TAKE 1 TABLET BY MOUTH EVERY 7 DAYS. TAKE WITH FULL GLASS OF WATER ON AN EMPTY STOMACH 4 tablet 11  . Calcium Carbonate (CALCIUM 600 PO) Take 2 tablets by mouth daily.    . carbamazepine (TEGRETOL) 200 MG tablet Take 400-600 mg by mouth 2 (two) times daily. 2 tablets in the am and 3 tablets in the pm    . cetirizine (ZYRTEC) 10 MG tablet Take 10 mg by mouth daily.    . fluticasone (FLONASE) 50 MCG/ACT nasal spray Place into both nostrils daily.    . fluticasone (FLOVENT DISKUS) 50 MCG/BLIST diskus inhaler Inhale 1 puff into the lungs 2 (  two) times daily. 1 Inhaler 12  . insulin regular (NOVOLIN R) 100 units/mL injection Use in insulin pump--total of 100 units per day. 70 mL 2  . lamoTRIgine (LAMICTAL) 200 MG tablet Take 200 mg by mouth 2 (two) times daily.     . Lancets (FREESTYLE) lancets Use as instructed dx 250.01 800 each 3  . Multiple Vitamin (MULTIVITAMIN) tablet Take 1 tablet by mouth daily.    . rosuvastatin (CRESTOR) 40 MG tablet TAKE 1 BY MOUTH DAILY AT BEDTIME 90 tablet 3  . SUMAtriptan (IMITREX) 100 MG tablet TAKE 1 TAB EVERY 2 HOURS AS NEEDED . MAY REPEAT IN 2 HOURS IF HEADACHE RECURS. NOT TO EXCEED 2 DAY 20 tablet 2  . terbinafine (LAMISIL) 250 MG tablet Take 1 tablet (250 mg total) by mouth daily. 90 tablet 0  . Transparent Dressings (OPSITE IV 3000)  MISC Inject 1 Device into the skin 3 days. 30 each 3  . VIT C-CHOLECALCIFEROL-ROSE HIP PO Take by mouth.    . ziprasidone (GEODON) 60 MG capsule Take 120 mg by mouth at bedtime. Take 2 by mouth at bedtime     No current facility-administered medications on file prior to visit.    Allergies  Allergen Reactions  . Varenicline Tartrate     REACTION: rash    Family History  Problem Relation Age of Onset  . Diabetes Father     (Oral Agents)  . Diabetes Paternal Grandmother   . Hypertension Paternal Grandmother   . Diabetes Paternal Grandfather   . Cancer Paternal Grandfather     colon  . Hypertension Paternal Grandfather     BP 112/70 mmHg  Pulse 67  Wt 169 lb 6.4 oz (76.839 kg)  SpO2 97%  LMP 04/25/2015  Review of Systems Denies LOC.  She still has insomnia.      Objective:   Physical Exam VITAL SIGNS:  See vs page GENERAL: no distress Pulses: dorsalis pedis intact bilat.   MSK: no deformity of the feet.  CV: no leg edema Skin:  no ulcer on the feet.  normal color and temp on the feet. Neuro: sensation is intact to touch on the feet.    A1c=7.0%    Assessment & Plan:  DM: well-controlled.  Patient is advised the following: Patient Instructions  Please come back for a regular physical appointment in 4 months.   Please see Dr Evelene Croon about the insomnia.   Please continue the same pump settings:  basal rate of 0.9 units/hr, 24 hrs per day. Suspend it for 1-2 hrs, if you are active.  mealtime bolus of 1 unit/ 13 grams carbohydrate. however, subtract 1 unit from calculated lunch bolus. Add 3 units to calculated breakfast bolus.  correction bolus (which some people call "sensitivity," or "insulin sensitivity ratio," or just "isr") of 1 unit for each 40 by which your blood sugar exceeds 100.  check your blood sugar 8 times per day, including if you have symptoms of your blood sugar being too high or too low.  please keep a record of the readings and bring it to your  next appointment here.  You can write it on any piece of paper.  please call us sooner if your blood sugar goes below 70, or if you have a lot of readings over 200.

## 2015-06-12 ENCOUNTER — Other Ambulatory Visit: Payer: Self-pay

## 2015-06-12 MED ORDER — ALENDRONATE SODIUM 70 MG PO TABS: ORAL_TABLET | ORAL | Status: DC

## 2015-06-16 ENCOUNTER — Telehealth: Payer: Self-pay | Admitting: Endocrinology

## 2015-06-16 MED ORDER — ROSUVASTATIN CALCIUM 40 MG PO TABS: ORAL_TABLET | ORAL | Status: DC

## 2015-06-16 NOTE — Telephone Encounter (Signed)
Patient need a refill of medication Crestor send to  [address] [place]- [place], [place] - [address] [phone] (Phone) [phone] (Fax)

## 2015-06-16 NOTE — Telephone Encounter (Signed)
Rx submitted per pt's request.  

## 2015-10-07 ENCOUNTER — Ambulatory Visit (INDEPENDENT_AMBULATORY_CARE_PROVIDER_SITE_OTHER): Payer: BLUE CROSS/BLUE SHIELD | Admitting: Endocrinology

## 2015-10-07 VITALS — BP 130/80 | HR 78 | Ht 67.0 in | Wt 179.0 lb

## 2015-10-07 DIAGNOSIS — Z0001 Encounter for general adult medical examination with abnormal findings: Secondary | ICD-10-CM | POA: Diagnosis not present

## 2015-10-07 DIAGNOSIS — Z794 Long term (current) use of insulin: Secondary | ICD-10-CM | POA: Diagnosis not present

## 2015-10-07 DIAGNOSIS — G43909 Migraine, unspecified, not intractable, without status migrainosus: Secondary | ICD-10-CM | POA: Diagnosis not present

## 2015-10-07 DIAGNOSIS — E1010 Type 1 diabetes mellitus with ketoacidosis without coma: Secondary | ICD-10-CM | POA: Diagnosis not present

## 2015-10-07 DIAGNOSIS — Z23 Encounter for immunization: Secondary | ICD-10-CM

## 2015-10-07 DIAGNOSIS — Z0000 Encounter for general adult medical examination without abnormal findings: Secondary | ICD-10-CM

## 2015-10-07 LAB — POCT GLYCOSYLATED HEMOGLOBIN (HGB A1C): Hemoglobin A1C: 6.8

## 2015-10-07 MED ORDER — RIZATRIPTAN BENZOATE 10 MG PO TABS: 10.0000 mg | ORAL_TABLET | ORAL | 3 refills | Status: DC | PRN

## 2015-10-07 MED ORDER — INSULIN REGULAR HUMAN 100 UNIT/ML IJ SOLN: INTRAMUSCULAR | 2 refills | Status: DC

## 2015-10-07 MED ORDER — ONDANSETRON HCL 4 MG PO TABS: 4.0000 mg | ORAL_TABLET | Freq: Three times a day (TID) | ORAL | 3 refills | Status: DC | PRN

## 2015-10-07 NOTE — Progress Notes (Signed)
Subjective:    Patient ID: Molly [name], female    DOB: Aug 10, 1970, 45 y.o.   MRN: [id number]  HPI Pt is here for regular wellness examination, and is feeling pretty well in general, and says chronic med probs are stable, except as noted below Past Medical History:  Diagnosis Date  . ALLERGIC RHINITIS CAUSE UNSPECIFIED 03/06/2007  . Bipolar 1 disorder (HCC)   . CONTUSION, ANKLE 08/28/2007  . DEPRESSION 08/03/2006  . DIABETES MELLITUS, TYPE I 08/03/2006  . DM retinopathy (HCC)   . Encounter for long-term (current) use of other medications   . Hyperlipidemia   . MIGRAINE, CHRONIC 03/06/2007  . Osteoporosis   . SEBACEOUS CYST, INFECTED 07/13/2007  . SMOKER 08/11/2009  . THROMBOCYTOPENIA 05/07/2008  . TINEA VERSICOLOR 07/13/2007    Past Surgical History:  Procedure Laterality Date  . Abnormal U/S  12/10/1996  . ELECTROCARDIOGRAM  06/13/2006  . TUBAL LIGATION      Social History   Social History  . Marital status: Widowed    Spouse name: N/A  . Number of children: N/A  . Years of education: N/A   Occupational History  . Not on file.   Social History Main Topics  . Smoking status: Former Smoker    Quit date: 10/16/2010  . Smokeless tobacco: Never Used  . Alcohol use No  . Drug use: No  . Sexual activity: Yes     Comment: tubal ligation   Other Topics Concern  . Not on file   Social History Narrative   Does not work outside the home    Current Outpatient Prescriptions on File Prior to Visit  Medication Sig Dispense Refill  . Alcohol Swabs (ALCOHOL PREP) 70 % PADS 8/day  250.01 800 each 3  . alendronate (FOSAMAX) 70 MG tablet TAKE 1 TABLET BY MOUTH EVERY 7 DAYS. TAKE WITH FULL GLASS OF WATER ON AN EMPTY STOMACH 12 tablet 2  . Calcium Carbonate (CALCIUM 600 PO) Take 2 tablets by mouth daily.    . carbamazepine (TEGRETOL) 200 MG tablet Take 400-600 mg by mouth 2 (two) times daily. 2 tablets in the am and 3 tablets in the pm    . cetirizine (ZYRTEC) 10 MG tablet Take  10 mg by mouth daily.    . fluticasone (FLONASE) 50 MCG/ACT nasal spray Place into both nostrils daily.    . fluticasone (FLOVENT DISKUS) 50 MCG/BLIST diskus inhaler Inhale 1 puff into the lungs 2 (two) times daily. 1 Inhaler 12  . glucose blood (FREESTYLE LITE) test strip 8/day dx 250.01, and lancets 800 each 3  . Insulin Infusion Pump Supplies (MINIMED INFUSION SET-MMT 398) MISC Inject 1 Device into the skin 3 days. Change every 3 days 30 each 3  . Insulin Infusion Pump Supplies (PARADIGM RESERVOIR ) MISC Inject 1 Device into the skin 3 days. Change every 3 days 30 each 3  . lamoTRIgine (LAMICTAL) 200 MG tablet Take 200 mg by mouth 2 (two) times daily.     . Lancets (FREESTYLE) lancets Use as instructed dx 250.01 800 each 3  . Multiple Vitamin (MULTIVITAMIN) tablet Take 1 tablet by mouth daily.    . rosuvastatin (CRESTOR) 40 MG tablet TAKE 1 BY MOUTH DAILY AT BEDTIME 90 tablet 3  . Transparent Dressings (OPSITE IV 3000) MISC Inject 1 Device into the skin 3 days. 30 each 3  . VIT C-CHOLECALCIFEROL-ROSE HIP PO Take by mouth.    . ziprasidone (GEODON) 60 MG capsule Take 120 mg by mouth at  bedtime. Take 2 by mouth at bedtime     No current facility-administered medications on file prior to visit.     Allergies  Allergen Reactions  . Varenicline Tartrate     REACTION: rash    Family History  Problem Relation Age of Onset  . Diabetes Father     (Oral Agents)  . Diabetes Paternal Grandmother   . Hypertension Paternal Grandmother   . Diabetes Paternal Grandfather   . Cancer Paternal Grandfather     colon  . Hypertension Paternal Grandfather     BP 130/80   Pulse 78   Ht 5\' 7"  (1.702 m)   Wt 179 lb (81.2 kg)   SpO2 96%   BMI 28.04 kg/m   Review of Systems  Constitutional: Negative for fever.  HENT: Negative for hearing loss.   Eyes: Negative for visual disturbance.  Respiratory: Negative for shortness of breath.   Cardiovascular: Negative for chest pain.    Gastrointestinal: Negative for blood in stool.  Endocrine: Negative for cold intolerance.  Genitourinary: Negative for hematuria.  Musculoskeletal: Negative for back pain.  Skin: Negative for rash.  Allergic/Immunologic: Positive for environmental allergies.  Neurological: Negative for syncope and numbness.  Hematological: Bruises/bleeds easily.  Psychiatric/Behavioral:       Depression is well-controlled       Objective:   Physical Exam VS: see vs page GEN: no distress HEAD: head: no deformity eyes: no periorbital swelling, no proptosis external nose and ears are normal mouth: no lesion seen NECK: supple, thyroid is not enlarged CHEST WALL: no deformity LUNGS:  Clear to auscultation BREASTS: sees gyn CV: reg rate and rhythm, no murmur ABD: abdomen is soft, nontender.  no hepatosplenomegaly.  not distended.  no hernia GENITALIA/RECTAL: sees gyn MUSCULOSKELETAL: muscle bulk and strength are grossly normal.  no obvious joint swelling.  gait is normal and steady PULSES: no carotid bruit NEURO:  cn 2-12 grossly intact.   readily moves all 4's.  SKIN:  Normal texture and temperature.  No rash or suspicious lesion is visible.   NODES:  None palpable at the neck PSYCH: alert, well-oriented.  Does not appear anxious nor depressed.     Assessment & Plan:  Wellness visit today, with problems stable, except as noted.   SEPARATE EVALUATION FOLLOWS--EACH PROBLEM HERE IS NEW, NOT RESPONDING TO TREATMENT, OR POSES SIGNIFICANT RISK TO THE PATIENT'S HEALTH: HISTORY OF THE PRESENT ILLNESS: imitrex does not help migraine.  maxalt helps her dtr's migraine.  She get headaches almost daily Pt returns for f/u of diabetes mellitus: DM type: 1 Dx'ed: 1983 Complications: none Therapy: insulin since dx DKA: last episode was in early 2016--pt says was due to a bad bottle of insulin.  Severe hypoglycemia: last episode was many years ago.  Pancreatitis: never.  Other: she has a paradigm  insulin pump; she declines continuous glucose monitor, due to cost Interval history:  She takes these pump settings:  basal rate of 0.9 units/hr, 24 hrs per day. She suspends for 1-2 hrs, if active.  mealtime bolus of 1 unit/ 13 grams carbohydrate. however, she subtracts 1 unit from calculated lunch bolus. Adds 3 units to calculated breakfast bolus.  correction bolus (which some people call "sensitivity," or "insulin sensitivity ratio," or just "isr") of 1 unit for each 40 by which cbg exceeds 100.  she brings a record of her cbg's which i have reviewed today.  Few are checked later in the day.  It varies from 80-300's.  It is highest after  breakfast, and lowest in the afternoon.  She has mild hypoglycemia approx once per week, and these are mild.  PAST MEDICAL HISTORY reviewed and up to date today REVIEW OF SYSTEMS: PHYSICAL EXAMINATION: VITAL SIGNS:  See vs page Pulses: dorsalis pedis intact bilat.   MSK: no deformity of the feet CV: no leg edema Skin:  no ulcer on the feet.  normal color and temp on the feet. Neuro: sensation is intact to touch on the feet GENERAL: no distress.  LAB/XRAY RESULTS:  Lab Results  Component Value Date   HGBA1C 6.8 10/07/2015  IMPRESSION: Type 1 DM: Needs increased rx, if it can be done with a regimen that avoids or minimizes hypoglycemia. Migraine: worse.

## 2015-10-07 NOTE — Patient Instructions (Addendum)
Please take these pump settings:  basal rate of 0.9 units/hr, 24 hrs per day. Suspend it for 1-2 hrs, if you are active.  mealtime bolus of 1 unit/ 13 grams carbohydrate. however, subtract 1 unit from calculated lunch bolus. Add 6 units to calculated breakfast bolus.  correction bolus (which some people call "sensitivity," or "insulin sensitivity ratio," or just "isr") of 1 unit for each 40 by which your blood sugar exceeds 100.  check your blood sugar 8 times per day, including if you have symptoms of your blood sugar being too high or too low.  please keep a record of the readings and bring it to your next appointment here.  You can write it on any piece of paper.  please call us sooner if your blood sugar goes below 70, or if you have a lot of readings over 200.   I have sent a prescription to your pharmacy, for maxalt.  Let me know if you want to see a specialist.   Please consider these measures for your health:  minimize alcohol.  Do not use tobacco products.  Have a colonoscopy at least every 10 years from age 750.  Women should have an annual mammogram from age 46.  Keep firearms safely stored.  Always use seat belts.  have working smoke alarms in your home.  See an eye doctor and dentist regularly.  Never drive under the influence of alcohol or drugs (including prescription drugs).  Those with fair skin should take precautions against the sun, and should carefully examine their skin once per month, for any new or changed moles.  Please come back for a follow-up appointment in 4 months.

## 2015-10-30 ENCOUNTER — Ambulatory Visit: Payer: BLUE CROSS/BLUE SHIELD | Admitting: Endocrinology

## 2015-11-11 ENCOUNTER — Encounter: Payer: Self-pay | Admitting: Endocrinology

## 2015-11-11 ENCOUNTER — Ambulatory Visit (INDEPENDENT_AMBULATORY_CARE_PROVIDER_SITE_OTHER): Payer: BLUE CROSS/BLUE SHIELD | Admitting: Endocrinology

## 2015-11-11 DIAGNOSIS — J069 Acute upper respiratory infection, unspecified: Secondary | ICD-10-CM

## 2015-11-11 MED ORDER — CEFUROXIME AXETIL 500 MG PO TABS: 500.0000 mg | ORAL_TABLET | Freq: Two times a day (BID) | ORAL | 0 refills | Status: DC

## 2015-11-11 NOTE — Progress Notes (Signed)
Subjective:    Patient ID: Molly [name], female    DOB: 07-19-1970, 45 y.o.   MRN: [id number]  HPI Pt states few weeks of intermittent slight pain at the throat, and assoc nasal congestion.   Past Medical History:  Diagnosis Date  . ALLERGIC RHINITIS CAUSE UNSPECIFIED 03/06/2007  . Bipolar 1 disorder (HCC)   . CONTUSION, ANKLE 08/28/2007  . DEPRESSION 08/03/2006  . DIABETES MELLITUS, TYPE I 08/03/2006  . DM retinopathy (HCC)   . Encounter for long-term (current) use of other medications   . Hyperlipidemia   . MIGRAINE, CHRONIC 03/06/2007  . Osteoporosis   . SEBACEOUS CYST, INFECTED 07/13/2007  . SMOKER 08/11/2009  . THROMBOCYTOPENIA 05/07/2008  . TINEA VERSICOLOR 07/13/2007    Past Surgical History:  Procedure Laterality Date  . Abnormal U/S  12/10/1996  . ELECTROCARDIOGRAM  06/13/2006  . TUBAL LIGATION      Social History   Social History  . Marital status: Widowed    Spouse name: N/A  . Number of children: N/A  . Years of education: N/A   Occupational History  . Not on file.   Social History Main Topics  . Smoking status: Former Smoker    Quit date: 10/16/2010  . Smokeless tobacco: Never Used  . Alcohol use No  . Drug use: No  . Sexual activity: Yes     Comment: tubal ligation   Other Topics Concern  . Not on file   Social History Narrative   Does not work outside the home    Current Outpatient Prescriptions on File Prior to Visit  Medication Sig Dispense Refill  . Alcohol Swabs (ALCOHOL PREP) 70 % PADS 8/day  250.01 800 each 3  . alendronate (FOSAMAX) 70 MG tablet TAKE 1 TABLET BY MOUTH EVERY 7 DAYS. TAKE WITH FULL GLASS OF WATER ON AN EMPTY STOMACH 12 tablet 2  . Calcium Carbonate (CALCIUM 600 PO) Take 2 tablets by mouth daily.    . carbamazepine (TEGRETOL) 200 MG tablet Take 400-600 mg by mouth 2 (two) times daily. 2 tablets in the am and 3 tablets in the pm    . cetirizine (ZYRTEC) 10 MG tablet Take 10 mg by mouth daily.    . fluticasone (FLONASE) 50  MCG/ACT nasal spray Place into both nostrils daily.    . fluticasone (FLOVENT DISKUS) 50 MCG/BLIST diskus inhaler Inhale 1 puff into the lungs 2 (two) times daily. 1 Inhaler 12  . glucose blood (FREESTYLE LITE) test strip 8/day dx 250.01, and lancets 800 each 3  . Insulin Infusion Pump Supplies (MINIMED INFUSION SET-MMT 398) MISC Inject 1 Device into the skin 3 days. Change every 3 days 30 each 3  . Insulin Infusion Pump Supplies (PARADIGM RESERVOIR 3ML) MISC Inject 1 Device into the skin 3 days. Change every 3 days 30 each 3  . insulin regular (NOVOLIN R) 100 units/mL injection Use in insulin pump--total of 100 units per day. 100 mL 2  . lamoTRIgine (LAMICTAL) 200 MG tablet Take 200 mg by mouth 2 (two) times daily.     . Lancets (FREESTYLE) lancets Use as instructed dx 250.01 800 each 3  . Multiple Vitamin (MULTIVITAMIN) tablet Take 1 tablet by mouth daily.    . ondansetron (ZOFRAN) 4 MG tablet Take 1 tablet (4 mg total) by mouth every 8 (eight) hours as needed for nausea or vomiting. 100 tablet 3  . rizatriptan (MAXALT) 10 MG tablet Take 1 tablet (10 mg total) by mouth as needed for migraine.  May repeat in 2 hours if needed 100 tablet 3  . rosuvastatin (CRESTOR) 40 MG tablet TAKE 1 BY MOUTH DAILY AT BEDTIME 90 tablet 3  . Transparent Dressings (OPSITE IV 3000) MISC Inject 1 Device into the skin 3 days. 30 each 3  . VIT C-CHOLECALCIFEROL-ROSE HIP PO Take by mouth.    . ziprasidone (GEODON) 60 MG capsule Take 120 mg by mouth at bedtime. Take 2 by mouth at bedtime     No current facility-administered medications on file prior to visit.     Allergies  Allergen Reactions  . Varenicline Tartrate     REACTION: rash    Family History  Problem Relation Age of Onset  . Diabetes Father     (Oral Agents)  . Diabetes Paternal Grandmother   . Hypertension Paternal Grandmother   . Diabetes Paternal Grandfather   . Cancer Paternal Grandfather     colon  . Hypertension Paternal Grandfather      BP 118/64   Pulse 78   Ht 5\' 7"  (1.702 m)   Wt 180 lb (81.6 kg)   SpO2 98%   BMI 28.19 kg/m    Review of Systems Denies fever, cough, and earache.     Objective:   Physical Exam VITAL SIGNS:  See vs page GENERAL: no distress head: no deformity  eyes: no periorbital swelling, no proptosis  external nose and ears are normal  mouth: no lesion seen Ears: both TM's are red.      Assessment & Plan:  URI: new Patient is advised the following: Patient Instructions  Please continue the same flonase and zyrtec-D.  I have sent a prescription to your pharmacy, for an antibiotic pill.  I hope you feel better soon.  If you don't feel better by next week, please call back.  Please call sooner if you get worse.

## 2015-11-11 NOTE — Patient Instructions (Signed)
Please continue the same flonase and zyrtec-D.  I have sent a prescription to your pharmacy, for an antibiotic pill.  I hope you feel better soon.  If you don't feel better by next week, please call back.  Please call sooner if you get worse.

## 2015-11-12 ENCOUNTER — Other Ambulatory Visit: Payer: Self-pay | Admitting: Endocrinology

## 2015-11-12 ENCOUNTER — Telehealth: Payer: Self-pay | Admitting: Endocrinology

## 2015-11-12 DIAGNOSIS — J069 Acute upper respiratory infection, unspecified: Secondary | ICD-10-CM | POA: Insufficient documentation

## 2015-11-12 MED ORDER — DOXYCYCLINE HYCLATE 100 MG PO TABS: 100.0000 mg | ORAL_TABLET | Freq: Two times a day (BID) | ORAL | 0 refills | Status: DC

## 2015-11-12 NOTE — Telephone Encounter (Signed)
Ok, I have sent a prescription to your pharmacy 

## 2015-11-12 NOTE — Telephone Encounter (Signed)
Pt called and requested that a different antibiotic be called in for her, the one that was sent in yesterday leaves a bad taste in her mouth for an extended amount of time.  Please advise.  Pt requests call back when done.

## 2015-11-13 ENCOUNTER — Telehealth: Payer: Self-pay

## 2015-11-13 ENCOUNTER — Other Ambulatory Visit: Payer: Self-pay | Admitting: Endocrinology

## 2015-11-13 DIAGNOSIS — Z1231 Encounter for screening mammogram for malignant neoplasm of breast: Secondary | ICD-10-CM

## 2015-11-13 NOTE — Telephone Encounter (Signed)
Called patient and notified of new rx that was sent to pharmacy, no other questions.

## 2015-11-23 ENCOUNTER — Encounter (HOSPITAL_COMMUNITY): Payer: Self-pay | Admitting: Emergency Medicine

## 2015-11-23 ENCOUNTER — Emergency Department (HOSPITAL_COMMUNITY): Payer: BLUE CROSS/BLUE SHIELD

## 2015-11-23 ENCOUNTER — Emergency Department (HOSPITAL_COMMUNITY)
Admission: EM | Admit: 2015-11-23 | Discharge: 2015-11-23 | Disposition: A | Discharge status: Home / Self Care | Payer: BLUE CROSS/BLUE SHIELD | Attending: Emergency Medicine | Admitting: Emergency Medicine

## 2015-11-23 DIAGNOSIS — R410 Disorientation, unspecified: Secondary | ICD-10-CM | POA: Insufficient documentation

## 2015-11-23 DIAGNOSIS — Z87891 Personal history of nicotine dependence: Secondary | ICD-10-CM | POA: Diagnosis not present

## 2015-11-23 DIAGNOSIS — E1065 Type 1 diabetes mellitus with hyperglycemia: Secondary | ICD-10-CM | POA: Insufficient documentation

## 2015-11-23 DIAGNOSIS — E162 Hypoglycemia, unspecified: Secondary | ICD-10-CM

## 2015-11-23 DIAGNOSIS — Z794 Long term (current) use of insulin: Secondary | ICD-10-CM | POA: Diagnosis not present

## 2015-11-23 DIAGNOSIS — Z79899 Other long term (current) drug therapy: Secondary | ICD-10-CM | POA: Diagnosis not present

## 2015-11-23 LAB — I-STAT BETA HCG BLOOD, ED (MC, WL, AP ONLY): I-stat hCG, quantitative: <5 m[IU]/mL (ref <5)

## 2015-11-23 LAB — CBG MONITORING, ED
GLUCOSE-CAPILLARY: 95 mg/dL (ref 65–99)
Glucose-Capillary: 70 mg/dL (ref 65–99)
Glucose-Capillary: 176 mg/dL — ABNORMAL HIGH (ref 65–99)
Glucose-Capillary: 254 mg/dL — ABNORMAL HIGH (ref 65–99)

## 2015-11-23 LAB — CBC WITH DIFFERENTIAL/PLATELET
BASOS PCT: 0 %
Basophils Absolute: 0 10*3/uL (ref 0.0–0.1)
EOS ABS: 0 10*3/uL (ref 0.0–0.7)
EOS PCT: 0 %
HCT: 40.4 % (ref 36.0–46.0)
HEMOGLOBIN: 13.6 g/dL (ref 12.0–15.0)
LYMPHS ABS: 0.6 10*3/uL — AB (ref 0.7–4.0)
Lymphocytes Relative: 8 %
MCH: 32.2 pg (ref 26.0–34.0)
MCHC: 33.7 g/dL (ref 30.0–36.0)
MCV: 95.5 fL (ref 78.0–100.0)
MONOS PCT: 9 %
Monocytes Absolute: 0.6 10*3/uL (ref 0.1–1.0)
NEUTROS PCT: 83 %
Neutro Abs: 6 10*3/uL (ref 1.7–7.7)
PLATELETS: 125 10*3/uL — AB (ref 150–400)
RBC: 4.23 MIL/uL (ref 3.87–5.11)
RDW: 12.4 % (ref 11.5–15.5)
WBC: 7.3 10*3/uL (ref 4.0–10.5)

## 2015-11-23 LAB — COMPREHENSIVE METABOLIC PANEL
ALK PHOS: 46 U/L (ref 38–126)
ALT: 27 U/L (ref 14–54)
AST: 36 U/L (ref 15–41)
Albumin: 4.4 g/dL (ref 3.5–5.0)
Anion gap: 7 (ref 5–15)
BILIRUBIN TOTAL: 0.3 mg/dL (ref 0.3–1.2)
BUN: 15 mg/dL (ref 6–20)
CALCIUM: 8.7 mg/dL — AB (ref 8.9–10.3)
CO2: 23 mmol/L (ref 22–32)
CREATININE: 0.58 mg/dL (ref 0.44–1.00)
Chloride: 106 mmol/L (ref 101–111)
Glucose, Bld: 61 mg/dL — ABNORMAL LOW (ref 65–99)
Potassium: 3.5 mmol/L (ref 3.5–5.1)
Sodium: 136 mmol/L (ref 135–145)
Total Protein: 6.8 g/dL (ref 6.5–8.1)

## 2015-11-23 MED ORDER — BACITRACIN ZINC 500 UNIT/GM EX OINT
TOPICAL_OINTMENT | CUTANEOUS | Status: AC
  Administered 2015-11-23: 1
  Filled 2015-11-23: qty 0.9

## 2015-11-23 NOTE — ED Notes (Signed)
Bed: [id number] Expected date:  Expected time:  Means of arrival:  Comments: F / Hypoglycemic

## 2015-11-23 NOTE — ED Notes (Signed)
Pt had an abrasion that was superficial to left knee and to right foot medial laterally and at the bottom of her foot cleansed area with NS applied bacitracin and Band-Aid. Pt tolerated well

## 2015-11-23 NOTE — ED Provider Notes (Signed)
[facility]-EMERGENCY DEPT Provider Note   CSN: [id number] Arrival date & time: 11/23/15  0050  By signing my name below, I, Phillis Haggis, attest that this documentation has been prepared under the direction and in the presence of Mattie Marlin, PA-C. Electronically Signed: Phillis Haggis, ED Scribe. 11/23/15. 1:22 AM.  History   Chief Complaint Chief Complaint  Patient presents with  . Hypoglycemia   The history is provided by the patient and a parent. No language interpreter was used.   HPI Comments: Molly [name] is a 45 y.o. female with a hx of Type I DM and DKA brought in by EMS who presents to the Emergency Department complaining of decreased CBG onset 1 hour ago. Pt was observed to be confused and combative by family at home. Mother says that this is the first time pt has ever exhibited this behavior. Mother notes that pt was found on the floor and was "thrashing" around on the floor. It is unknown if pt hit her head. Pt says she was unconscious at the time. She is on an insulin pump. Pt's initial CBG reading was 42, but increased to 72 after 250 ml of D10. Pt says she last ate a salad for dinner. She did not take insulin at this time. Pt receives 0.9 units of insulin every hour from her pump. She will inject more if necessary based on CBG. Her last injection not regulated by the pump was around 1 PM 11/22/15. She used 3 units at that time. She knows that her CBG is getting lower when she has tongue numbness. Pt says that she bruises easily. Pt denies headache, vision changes, neck pain, neck stiffness, diarrhea or nausea. Pt is not on blood thinners.   Past Medical History:  Diagnosis Date  . ALLERGIC RHINITIS CAUSE UNSPECIFIED 03/06/2007  . Bipolar 1 disorder (HCC)   . CONTUSION, ANKLE 08/28/2007  . DEPRESSION 08/03/2006  . DIABETES MELLITUS, TYPE I 08/03/2006  . DM retinopathy (HCC)   . Encounter for long-term (current) use of other medications   . Hyperlipidemia   . MIGRAINE,  CHRONIC 03/06/2007  . Osteoporosis   . SEBACEOUS CYST, INFECTED 07/13/2007  . SMOKER 08/11/2009  . THROMBOCYTOPENIA 05/07/2008  . TINEA VERSICOLOR 07/13/2007    Patient Active Problem List   Diagnosis Date Noted  . URI (upper respiratory infection) 11/12/2015  . Amenorrhea, secondary 03/02/2015  . Recurrent acute sinusitis 05/23/2014  . Anemia 04/08/2014  . Diabetic ketoacidosis without coma associated with type 1 diabetes mellitus (HCC)   . HLD (hyperlipidemia) 04/07/2014  . 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 Fever 04/07/2014  . DKA (diabetic ketoacidoses) (HCC) 04/06/2014  . Nausea & vomiting 04/06/2014  . Bipolar 1 disorder (HCC)   . Pain in joint, upper arm 10/22/2013  . Routine general medical examination at a health care facility 10/04/2013  . Visit for occupational health examination 11/10/2012  . Nonspecific abnormal electrocardiogram (ECG) (EKG) 10/02/2012  . Osteoporosis 10/02/2012  . Quit smoking 04/24/2012  . Arthralgia of hand, left 12/01/2011  . Encounter for long-term (current) use of other medications 09/14/2010  . HEMATURIA UNSPECIFIED 08/11/2009  . PURE HYPERCHOLESTEROLEMIA 08/05/2008  . THROMBOCYTOPENIA 05/07/2008  . CONTUSION, ANKLE 08/28/2007  . TINEA VERSICOLOR 07/13/2007  . SEBACEOUS CYST, INFECTED 07/13/2007  . COUGH 04/18/2007  . Migraine 03/06/2007  . Seasonal and perennial allergic rhinitis 03/06/2007  . Type 1 diabetes mellitus (HCC) 08/03/2006  . Depression 08/03/2006    Past Surgical History:  Procedure Laterality Date  .  Abnormal U/S  12/10/1996  . ELECTROCARDIOGRAM  06/13/2006  . TUBAL LIGATION      OB History    Gravida Para Term Preterm AB Living   4 2 1 1 2 2    SAB TAB Ectopic Multiple Live Births   2       2       Home Medications    Prior to Admission medications   Medication Sig Start Date End Date Taking? Authorizing Provider  alendronate (FOSAMAX) 70 MG tablet TAKE 1 TABLET BY MOUTH EVERY 7 DAYS. TAKE WITH FULL  GLASS OF WATER ON AN EMPTY STOMACH 06/12/15  Yes Sean Ellison, MD  Calcium Carbonate (CALCIUM 600 PO) Take 1 tablet by mouth 2 (two) times daily.    Yes Historical Provider, MD  carbamazepine (TEGRETOL) 200 MG tablet Take 400-800 mg by mouth 2 (two) times daily. 2 tablets in the am and 4 tablets in the pm   Yes Historical Provider, MD  cefUROXime (CEFTIN) 500 MG tablet Take 500 mg by mouth 2 (two) times daily with a meal. 11/11/15  Yes Historical Provider, MD  cetirizine-pseudoephedrine (ZYRTEC-D) 5-120 MG tablet Take 1 tablet by mouth daily.   Yes Historical Provider, MD  fluticasone (FLOVENT DISKUS) 50 MCG/BLIST diskus inhaler Inhale 1 puff into the lungs 2 (two) times daily. 01/02/15  Yes Sean Ellison, MD  insulin regular (NOVOLIN R) 100 units/mL injection Use in insulin pump--total of 100 units per day. 10/07/15  Yes Sean Ellison, MD  lamoTRIgine (LAMICTAL) 200 MG tablet Take 200 mg by mouth 2 (two) times daily.    Yes Historical Provider, MD  Multiple Vitamin (MULTIVITAMIN) tablet Take 1 tablet by mouth daily.   Yes Historical Provider, MD  ondansetron (ZOFRAN) 4 MG tablet Take 1 tablet (4 mg total) by mouth every 8 (eight) hours as needed for nausea or vomiting. 10/07/15  Yes Sean Ellison, MD  rizatriptan (MAXALT) 10 MG tablet Take 1 tablet (10 mg total) by mouth as needed for migraine. May repeat in 2 hours if needed 10/07/15  Yes Sean Ellison, MD  rosuvastatin (CRESTOR) 40 MG tablet TAKE 1 BY MOUTH DAILY AT BEDTIME 06/16/15  Yes Sean Ellison, MD  VIT C-CHOLECALCIFEROL-ROSE HIP PO Take 1 tablet by mouth daily.    Yes Historical Provider, MD  ziprasidone (GEODON) 60 MG capsule Take 120 mg by mouth at bedtime. Take 2 by mouth at bedtime   Yes Historical Provider, MD  zolpidem (AMBIEN) 10 MG tablet Take 10 mg by mouth at bedtime. 11/20/15  Yes Historical Provider, MD  Alcohol Swabs (ALCOHOL PREP) 70 % PADS 8/day  250.01 Patient not taking: Reported on 11/23/2015 10/27/11   Sean Ellison, MD  doxycycline  (VIBRA-TABS) 100 MG tablet Take 1 tablet (100 mg total) by mouth 2 (two) times daily. Patient not taking: Reported on 11/23/2015 11/12/15   Sean Ellison, MD  glucose blood (FREESTYLE LITE) test strip 8/day dx 250.01, and lancets 06/01/15   Sean Ellison, MD  Insulin Infusion Pump Supplies (MINIMED INFUSION SET-MMT 398) MISC Inject 1 Device into the skin 3 days. Change every 3 days 06/01/15   Sean Ellison, MD  Insulin Infusion Pump Supplies (PARADIGM RESERVOIR <MEASUREMENTKorea4BerySkyline Surgery Center LLCe0Marland [phone] 1[place]7BeryMerced Ambulatory Endoscopy Centere0Marland [phone] [place]4BeryHunterdon Endosurgery Centere0Marland [phone] 2[place]orea1BeryLake Endoscopy Centere0Marland [phone] 0[place]orea6BeryAtlantic Surgery And Laser Center LLCe0Marland [phone] 2[place]orea5BeryLos Angeles County Olive View-[facility]cl[facility] Medical Centere0Marland [phone] [place]2BeryThe Endoscopy Center [facility]0Marland [phone] 8[place]1BeryCentral Montana Medical Centere0Marland [phone] 7[place]orea4BeryFort Hamilton Hughes Memorial Hospitale0Marland [phone] 4 1[place]3BeryPeace Harbor Hospitale0Marland [phone] 0[place]orea3BeryRivendell Behavioral Health Servicese0Marland Kitchen277559-6 7[place]orea4BeryPromise [facility]use0Marland [phone] [place]6BeryGifford Medical Centere0Marland [phone] [place]4BeryAustin Lakes Hospitale0Marland [phone] Inject 1 Device into the skin 3 days. Change every 3 days 06/01/15   Sean Ellison, MD  Lancets (FREESTYLE) lancets Use as instructed dx 250.01 08/10/12   Sean Ellison, MD  Transparent Dressings (OPSITE IV 3000) MISC Inject 1 Device into the skin 3 days. 04/26/10   Sean Ellison, MD  Family History Family History  Problem Relation Age of Onset  . Diabetes Father     (Oral Agents)  . Diabetes Paternal Grandmother   . Hypertension Paternal Grandmother   . Diabetes Paternal Grandfather   . Cancer Paternal Grandfather     colon  . Hypertension Paternal Grandfather     Social History Social History  Substance Use Topics  . Smoking status: Former Smoker    Quit date: 10/16/2010  . Smokeless tobacco: Never Used  . Alcohol use No     Allergies   Varenicline tartrate   Review of Systems Review of Systems  Eyes: Negative for visual disturbance.  Gastrointestinal: Negative for diarrhea and nausea.  Musculoskeletal: Negative for neck pain and neck stiffness.  Neurological: Negative for headaches.  Hematological: Bruises/bleeds easily.  Psychiatric/Behavioral: Positive for confusion.  All other systems reviewed and are negative.  Physical Exam Updated Vital Signs BP 117/63 (BP Location: Left Arm)   Pulse 77   Temp 97.5 F (36.4 C) (Oral)   Resp 18   SpO2 100%   Physical Exam  Constitutional: Pt is oriented to person, place, and time. Pt appears well-developed and well-nourished. No distress.  HENT:  Head:  Normocephalic and atraumatic.  Mouth/Throat: Oropharynx is clear and moist.  Eyes: Conjunctivae and EOM are normal. Pupils are equal, round, and reactive to light. No scleral icterus.  No horizontal, vertical or rotational nystagmus  Neck: Normal range of motion. Neck supple.  Full active and passive ROM without pain No nuchal rigidity or meningeal signs  Cardiovascular: Normal rate, regular rhythm and intact distal pulses including radial and DP 2+ bilaterally.   Pulmonary/Chest: Effort normal  No respiratory distress.  Abdominal: Soft. There is no tenderness. There is no rebound and no guarding.  Musculoskeletal: Normal range of motion. Two small abrasions noted to plantar surface of right foot and medial foot; contusion and abrasion to left proximal anterior shin Neurological: Pt. is alert and oriented to person, place, and time. No cranial nerve deficit.  Exhibits normal muscle tone. Coordination normal.  Mental Status:  Alert, oriented, thought content appropriate. Speech fluent without evidence of aphasia. Able to follow 2 step commands without difficulty.  Cranial Nerves:  II:  Peripheral visual fields grossly normal, pupils equal, round, reactive to light III,IV, VI: ptosis not present, extra-ocular motions intact bilaterally  V,VII: smile symmetric, facial light touch sensation equal VIII: hearing grossly normal bilaterally  IX,X: midline uvula rise  XI: bilateral shoulder shrug equal and strong XII: midline tongue extension  Motor:  5/5 in upper and lower extremities bilaterally including strong and equal grip strength and dorsiflexion/plantar flexion Sensory: light touch normal in all extremities.  Cerebellar: normal finger-to-nose with bilateral upper extremities, pronator drift negative Gait: normal gait and balance Skin: Skin is warm and dry. No rash noted. Pt is not diaphoretic.  Psychiatric: Pt has a normal mood and affect. Behavior is normal. Judgment and thought content  normal.  Nursing note and vitals reviewed.  ED Treatments / Results  DIAGNOSTIC STUDIES: Oxygen Saturation is 100% on RA, normal by my interpretation.    COORDINATION OF CARE: 1:21 AM-Discussed treatment plan which includes labs with pt at bedside and pt agreed to plan.    Labs (all labs ordered are listed, but only abnormal results are displayed) Labs Reviewed  COMPREHENSIVE METABOLIC PANEL - Abnormal; Notable for the following:       Result Value   Glucose, Bld 61 (*)    Calcium 8.7 (*)  All other components within normal limits  CBC WITH DIFFERENTIAL/PLATELET - Abnormal; Notable for the following:    Platelets 125 (*)    Lymphs Abs 0.6 (*)    All other components within normal limits  CBG MONITORING, ED - Abnormal; Notable for the following:    Glucose-Capillary 176 (*)    All other components within normal limits  CBG MONITORING, ED - Abnormal; Notable for the following:    Glucose-Capillary 254 (*)    All other components within normal limits  CBG MONITORING, ED  CBG MONITORING, ED  I-STAT BETA HCG BLOOD, ED ([facility], [facility], [facility] ONLY)    EKG  EKG Interpretation None       Radiology Ct Head Wo Contrast  Result Date: 11/23/2015 CLINICAL DATA:  Found confused and thrashing on ground. May have hit head. Initial encounter. EXAM: CT HEAD WITHOUT CONTRAST TECHNIQUE: Contiguous axial images were obtained from the base of the skull through the vertex without intravenous contrast. COMPARISON:  None. FINDINGS: Brain: No evidence of acute infarction, hemorrhage, hydrocephalus, extra-axial collection or mass lesion/mass effect. Prominence of the sulci suggests mild cortical volume loss. The brainstem and fourth ventricle are within normal limits. The basal ganglia are unremarkable in appearance. The cerebral hemispheres demonstrate grossly normal gray-white differentiation. No mass effect or midline shift is seen. Vascular: No hyperdense vessel or unexpected calcification. Skull: There  is no evidence of fracture; visualized osseous structures are unremarkable in appearance. Sinuses/Orbits: The visualized portions of the orbits are within normal limits. The paranasal sinuses and mastoid air cells are well-aerated. Other: No significant soft tissue abnormalities are seen. IMPRESSION: 1. No evidence of traumatic intracranial injury or fracture. 2. Mild cortical volume loss suggested. Electronically Signed   By: Roanna Raider M.D.   On: 11/23/2015 03:24    Procedures Procedures (including critical care time)  Medications Ordered in ED Medications  bacitracin 500 UNIT/GM ointment (1 application  Given 11/23/15 0152)     Initial Impression / Assessment and Plan / ED Course  I have reviewed the triage vital signs and the nursing notes.  Pertinent labs & imaging results that were available during my care of the patient were reviewed by me and considered in my medical decision making (see chart for details).  Clinical Course   Patient presents with hypoglycemia. Patient is type I diabetic with a insulin pump. Patient's glucose was 61 upon arrival to the ED. Instructed patient to disconnect her insulin pump while in the ED. Patient was given food and juice. Prior to discharge patient's glucose was 254. Labs revealed thrombocytopenia. Other labs unremarkable. Patient is unsure if she hit her head. Patient without headache and no neurological deficits on exam. Reasonable to obtain CT of head due to patient's low platelet count. CT head reviewed by me was negative for any acute intracranial abnormalities. Instructed patient to reconnect her insulin pump and monitor her blood glucose closely. Instructed her to call her primary care provider and endocrinologist today to have her pump evaluated today if possible. Discussed strict return precautions to the ED. Pt afebrile, alert, VSS and stable at time of discharge.  I discussed all of the results with the patient and family members they have  expressed their understanding to the verbal discharge instructions.  Pt case discussed with Dr. Blinda Leatherwood who agrees with the above plan.  Final Clinical Impressions(s) / ED Diagnoses   Final diagnoses:  Hypoglycemia   I personally performed the services described in this documentation, which was scribed in my  presence. The recorded information has been reviewed and is accurate.    New Prescriptions Discharge Medication List as of 11/23/2015  3:57 AM       Jerre Simon, PA 11/24/15 1235    Gilda Crease, MD 11/28/15 [phone]

## 2015-11-23 NOTE — ED Triage Notes (Signed)
Brought in by EMS from home with c/o hypoglycemia.  Pt was observed by family exhibiting confusion and combative behavior--- CBG was checked and it was 42.  Pt is insulin-dependent and on insulin pump.  Pt was given 250 ml of D10---- pt's CBG went up to 172.   Pt returned to her baseline mentation and behavior en route.  Pt arrived to ED A/Ox 4, in no s/s distress and no complaints.

## 2015-11-23 NOTE — Discharge Instructions (Signed)
Check your blood sugar often. Call your primary care provider today to have your pump evaluated. Return to the emergency department if you experience signs of hypoglycemia, headache, visual changes, vomiting, neck pain or stiffness, fever or any other concerning symptoms.

## 2015-11-30 ENCOUNTER — Ambulatory Visit (INDEPENDENT_AMBULATORY_CARE_PROVIDER_SITE_OTHER): Payer: BLUE CROSS/BLUE SHIELD | Admitting: Endocrinology

## 2015-11-30 ENCOUNTER — Encounter: Payer: Self-pay | Admitting: Endocrinology

## 2015-11-30 DIAGNOSIS — S29011A Strain of muscle and tendon of front wall of thorax, initial encounter: Secondary | ICD-10-CM | POA: Insufficient documentation

## 2015-11-30 MED ORDER — HYDROCODONE-ACETAMINOPHEN 10-325 MG PO TABS: 1.0000 | ORAL_TABLET | Freq: Four times a day (QID) | ORAL | 0 refills | Status: DC | PRN

## 2015-11-30 NOTE — Patient Instructions (Signed)
Here is a prescription, for a pain pill. I hope you feel better soon.  If you don't feel better by next week, please call back.

## 2015-11-30 NOTE — Progress Notes (Signed)
Subjective:    Patient ID: Molly [name], female    DOB: 1970-12-31, 45 y.o.   MRN: [id number]  HPI Pt states 1 week of sudden-onset severe pain at the right anterolateral chest wall, in the context of breathing. This started when she was leaning over a freezer at home.   Past Medical History:  Diagnosis Date  . ALLERGIC RHINITIS CAUSE UNSPECIFIED 03/06/2007  . Bipolar 1 disorder (HCC)   . CONTUSION, ANKLE 08/28/2007  . DEPRESSION 08/03/2006  . DIABETES MELLITUS, TYPE I 08/03/2006  . DM retinopathy (HCC)   . Encounter for long-term (current) use of other medications   . Hyperlipidemia   . MIGRAINE, CHRONIC 03/06/2007  . Osteoporosis   . SEBACEOUS CYST, INFECTED 07/13/2007  . SMOKER 08/11/2009  . THROMBOCYTOPENIA 05/07/2008  . TINEA VERSICOLOR 07/13/2007    Past Surgical History:  Procedure Laterality Date  . Abnormal U/S  12/10/1996  . ELECTROCARDIOGRAM  06/13/2006  . TUBAL LIGATION      Social History   Social History  . Marital status: Widowed    Spouse name: N/A  . Number of children: N/A  . Years of education: N/A   Occupational History  . Not on file.   Social History Main Topics  . Smoking status: Former Smoker    Quit date: 10/16/2010  . Smokeless tobacco: Never Used  . Alcohol use No  . Drug use: No  . Sexual activity: Yes     Comment: tubal ligation   Other Topics Concern  . Not on file   Social History Narrative   Does not work outside the home    Current Outpatient Prescriptions on File Prior to Visit  Medication Sig Dispense Refill  . Alcohol Swabs (ALCOHOL PREP) 70 % PADS 8/day  250.01 800 each 3  . alendronate (FOSAMAX) 70 MG tablet TAKE 1 TABLET BY MOUTH EVERY 7 DAYS. TAKE WITH FULL GLASS OF WATER ON AN EMPTY STOMACH 12 tablet 2  . Calcium Carbonate (CALCIUM 600 PO) Take 1 tablet by mouth 2 (two) times daily.     . carbamazepine (TEGRETOL) 200 MG tablet Take 400-800 mg by mouth 2 (two) times daily. 2 tablets in the am and 4 tablets in the pm      . cetirizine-pseudoephedrine (ZYRTEC-D) 5-120 MG tablet Take 1 tablet by mouth daily.    Marland Kitchen. doxycycline (VIBRA-TABS) 100 MG tablet Take 1 tablet (100 mg total) by mouth 2 (two) times daily. 20 tablet 0  . fluticasone (FLOVENT DISKUS) 50 MCG/BLIST diskus inhaler Inhale 1 puff into the lungs 2 (two) times daily. 1 Inhaler 12  . glucose blood (FREESTYLE LITE) test strip 8/day dx 250.01, and lancets 800 each 3  . Insulin Infusion Pump Supplies (MINIMED INFUSION SET-MMT 398) MISC Inject 1 Device into the skin 3 days. Change every 3 days 30 each 3  . Insulin Infusion Pump Supplies (PARADIGM RESERVOIR 3ML) MISC Inject 1 Device into the skin 3 days. Change every 3 days 30 each 3  . insulin regular (NOVOLIN R) 100 units/mL injection Use in insulin pump--total of 100 units per day. 100 mL 2  . lamoTRIgine (LAMICTAL) 200 MG tablet Take 200 mg by mouth 2 (two) times daily.     . Lancets (FREESTYLE) lancets Use as instructed dx 250.01 800 each 3  . Multiple Vitamin (MULTIVITAMIN) tablet Take 1 tablet by mouth daily.    . ondansetron (ZOFRAN) 4 MG tablet Take 1 tablet (4 mg total) by mouth every 8 (eight) hours as  needed for nausea or vomiting. 100 tablet 3  . rizatriptan (MAXALT) 10 MG tablet Take 1 tablet (10 mg total) by mouth as needed for migraine. May repeat in 2 hours if needed 100 tablet 3  . rosuvastatin (CRESTOR) 40 MG tablet TAKE 1 BY MOUTH DAILY AT BEDTIME 90 tablet 3  . Transparent Dressings (OPSITE IV 3000) MISC Inject 1 Device into the skin 3 days. 30 each 3  . VIT C-CHOLECALCIFEROL-ROSE HIP PO Take 1 tablet by mouth daily.     . ziprasidone (GEODON) 60 MG capsule Take 120 mg by mouth at bedtime. Take 2 by mouth at bedtime    . zolpidem (AMBIEN) 10 MG tablet Take 10 mg by mouth at bedtime.  1   No current facility-administered medications on file prior to visit.     Allergies  Allergen Reactions  . Varenicline Tartrate     REACTION: rash    Family History  Problem Relation Age of Onset   . Diabetes Father     (Oral Agents)  . Diabetes Paternal Grandmother   . Hypertension Paternal Grandmother   . Diabetes Paternal Grandfather   . Cancer Paternal Grandfather     colon  . Hypertension Paternal Grandfather     BP 138/64   Pulse 77   Ht 5\' 7"  (1.702 m)   Wt 183 lb (83 kg)   SpO2 98%   BMI 28.66 kg/m   Review of Systems Denies fever, sob, and cough.      Objective:   Physical Exam VITAL SIGNS:  See vs page GENERAL: no distress Chest wall: tender at the right anterolateral aspect.   LUNGS:  Clear to auscultation.       Assessment & Plan:  Chest wall strain, new. She declines CXR and d-dimer.   Patient is advised the following: Patient Instructions  Here is a prescription, for a pain pill. I hope you feel better soon.  If you don't feel better by next week, please call back.

## 2015-12-01 ENCOUNTER — Ambulatory Visit
Admission: RE | Admit: 2015-12-01 | Discharge: 2015-12-01 | Disposition: A | Discharge status: Home / Self Care | Payer: BLUE CROSS/BLUE SHIELD | Source: Ambulatory Visit | Attending: Endocrinology | Admitting: Endocrinology

## 2015-12-01 DIAGNOSIS — Z1231 Encounter for screening mammogram for malignant neoplasm of breast: Secondary | ICD-10-CM

## 2015-12-17 ENCOUNTER — Other Ambulatory Visit: Payer: Self-pay | Admitting: Endocrinology

## 2016-01-30 ENCOUNTER — Other Ambulatory Visit: Payer: Self-pay | Admitting: Endocrinology

## 2016-01-31 NOTE — Progress Notes (Deleted)
   Subjective:    Patient ID: Molly [name], female    DOB: Oct 24, 1970, 46 y.o.   MRN: [id number]  HPI Pt returns for f/u of diabetes mellitus: DM type: 1 Dx'ed: 1983 Complications: none Therapy: insulin since dx DKA: last episode was in early 2016--pt says was due to a bad bottle of insulin.  Severe hypoglycemia: last episode was many years ago.  Pancreatitis: never.  Other: she has a paradigm insulin pump; she declines continuous glucose monitor, due to cost Interval history:  She takes these pump settings:  basal rate of 0.9 units/hr, 24 hrs per day. She suspends for 1-2 hrs, if active.   mealtime bolus of 1 unit/ 13 grams carbohydrate. however, subtract 1 unit from calculated lunch bolus. Add 6 units to calculated breakfast bolus.  correction bolus (which some people call "sensitivity," or "insulin sensitivity ratio," or just "isr") of 1 unit for each 40 by which your blood sugar exceeds 100.  she brings a record of her cbg's which i have reviewed today.    UTD   Review of Systems     Objective:   Physical Exam VITAL SIGNS:  See vs page GENERAL: no distress Pulses: dorsalis pedis intact bilat.   MSK: no deformity of the feet CV: no leg edema Skin:  no ulcer on the feet.  normal color and temp on the feet. Neuro: sensation is intact to touch on the feet        Assessment & Plan:

## 2016-02-05 ENCOUNTER — Ambulatory Visit: Payer: BLUE CROSS/BLUE SHIELD | Admitting: Endocrinology

## 2016-02-24 ENCOUNTER — Ambulatory Visit (INDEPENDENT_AMBULATORY_CARE_PROVIDER_SITE_OTHER): Payer: BLUE CROSS/BLUE SHIELD | Admitting: Endocrinology

## 2016-02-24 ENCOUNTER — Telehealth: Payer: Self-pay | Admitting: Endocrinology

## 2016-02-24 ENCOUNTER — Encounter: Payer: Self-pay | Admitting: Endocrinology

## 2016-02-24 VITALS — BP 136/80 | HR 98 | Ht 67.0 in | Wt 184.0 lb

## 2016-02-24 DIAGNOSIS — Z0000 Encounter for general adult medical examination without abnormal findings: Secondary | ICD-10-CM | POA: Diagnosis not present

## 2016-02-24 DIAGNOSIS — M810 Age-related osteoporosis without current pathological fracture: Secondary | ICD-10-CM

## 2016-02-24 DIAGNOSIS — E1010 Type 1 diabetes mellitus with ketoacidosis without coma: Secondary | ICD-10-CM

## 2016-02-24 LAB — LIPID PANEL
CHOLESTEROL: 216 mg/dL — AB (ref 0–200)
HDL: 101 mg/dL (ref >39.00)
LDL Cholesterol: 102 mg/dL — ABNORMAL HIGH (ref 0–99)
NonHDL: 114.77
Total CHOL/HDL Ratio: 2
Triglycerides: 64 mg/dL (ref 0.0–149.0)
VLDL: 12.8 mg/dL (ref 0.0–40.0)

## 2016-02-24 LAB — HEPATIC FUNCTION PANEL
ALBUMIN: 4.4 g/dL (ref 3.5–5.2)
ALT: 37 U/L — ABNORMAL HIGH (ref 0–35)
AST: 25 U/L (ref 0–37)
Alkaline Phosphatase: 66 U/L (ref 39–117)
BILIRUBIN DIRECT: 0.1 mg/dL (ref 0.0–0.3)
TOTAL PROTEIN: 6.9 g/dL (ref 6.0–8.3)
Total Bilirubin: 0.5 mg/dL (ref 0.2–1.2)

## 2016-02-24 LAB — URINALYSIS, ROUTINE W REFLEX MICROSCOPIC
Bilirubin Urine: NEGATIVE
Hgb urine dipstick: NEGATIVE
KETONES UR: NEGATIVE
Leukocytes, UA: NEGATIVE
Nitrite: POSITIVE — AB
PH: 6 (ref 5.0–8.0)
RBC / HPF: NONE SEEN (ref 0–?)
TOTAL PROTEIN, URINE-UPE24: NEGATIVE
UROBILINOGEN UA: 0.2 (ref 0.0–1.0)
WBC, UA: NONE SEEN (ref 0–?)

## 2016-02-24 LAB — VITAMIN D 25 HYDROXY (VIT D DEFICIENCY, FRACTURES): VITD: 34.05 ng/mL (ref 30.00–100.00)

## 2016-02-24 LAB — POCT GLYCOSYLATED HEMOGLOBIN (HGB A1C): Hemoglobin A1C: 7.7

## 2016-02-24 LAB — MICROALBUMIN / CREATININE URINE RATIO
CREATININE, U: 31.3 mg/dL
MICROALB UR: 0.4 mg/dL (ref 0.0–1.9)
MICROALB/CREAT RATIO: 1.3 mg/g (ref 0.0–30.0)

## 2016-02-24 LAB — TSH: TSH: 3.04 u[IU]/mL (ref 0.35–4.50)

## 2016-02-24 MED ORDER — PROMETHAZINE-CODEINE 6.25-10 MG/5ML PO SYRP: 5.0000 mL | ORAL_SOLUTION | ORAL | 1 refills | Status: DC | PRN

## 2016-02-24 MED ORDER — TERBINAFINE HCL 250 MG PO TABS: 250.0000 mg | ORAL_TABLET | Freq: Every day | ORAL | 0 refills | Status: DC

## 2016-02-24 NOTE — Progress Notes (Signed)
Subjective:    Patient ID: Molly [name], female    DOB: 1970-04-02, 46 y.o.   MRN: [id number]  HPI Pt returns for f/u of diabetes mellitus: DM type: 1 Dx'ed: 1983 Complications: none Therapy: insulin since dx DKA: last episode was in early 2016--pt says was due to a bad bottle of insulin.   Severe hypoglycemia: last episode was many years ago.  Pancreatitis: never.  Other: she has a paradigm insulin pump; she declines continuous glucose monitor, due to cost Interval history:  She takes these pump settings:  basal rate of 0.9 units/hr, 24 hrs per day. She suspends for 1-2 hrs, if active. mealtime bolus of 1 unit/ 13 grams carbohydrate. however, she subtracts 1 unit from calculated lunch bolus. Adds 4 units to calculated breakfast bolus.  correction bolus (which some people call "sensitivity," or "insulin sensitivity ratio," or just "isr") of 1 unit for each 40 by which cbg exceeds 100.  She averages approx 60 total units per day.   no cbg record, but states cbg's are high over the past month.  It is highest after breakfast or AM coffee (300-500).  She says fasting cbg's are usually in the low to mid-100's.   Past Medical History:  Diagnosis Date  . ALLERGIC RHINITIS CAUSE UNSPECIFIED 03/06/2007  . Bipolar 1 disorder (HCC)   . CONTUSION, ANKLE 08/28/2007  . DEPRESSION 08/03/2006  . DIABETES MELLITUS, TYPE I 08/03/2006  . DM retinopathy (HCC)   . Encounter for long-term (current) use of other medications   . Hyperlipidemia   . MIGRAINE, CHRONIC 03/06/2007  . Osteoporosis   . SEBACEOUS CYST, INFECTED 07/13/2007  . SMOKER 08/11/2009  . THROMBOCYTOPENIA 05/07/2008  . TINEA VERSICOLOR 07/13/2007    Past Surgical History:  Procedure Laterality Date  . Abnormal U/S  12/10/1996  . ELECTROCARDIOGRAM  06/13/2006  . TUBAL LIGATION      Social History   Social History  . Marital status: Widowed    Spouse name: N/A  . Number of children: N/A  . Years of education: N/A    Occupational History  . Not on file.   Social History Main Topics  . Smoking status: Former Smoker    Quit date: 10/16/2010  . Smokeless tobacco: Never Used  . Alcohol use No  . Drug use: No  . Sexual activity: Yes     Comment: tubal ligation   Other Topics Concern  . Not on file   Social History Narrative   Does not work outside the home    Current Outpatient Prescriptions on File Prior to Visit  Medication Sig Dispense Refill  . Alcohol Swabs (ALCOHOL PREP) 70 % PADS 8/day  250.01 800 each 3  . alendronate (FOSAMAX) 70 MG tablet TAKE 1 TABLET BY MOUTH EVERY 7 DAYS. TAKE WITH FULL GLASS OF WATER ON AN EMPTY STOMACH 12 tablet 2  . Calcium Carbonate (CALCIUM 600 PO) Take 1 tablet by mouth 2 (two) times daily.     . carbamazepine (TEGRETOL) 200 MG tablet Take 400-800 mg by mouth 2 (two) times daily. 2 tablets in the am and 4 tablets in the pm    . cetirizine-pseudoephedrine (ZYRTEC-D) 5-120 MG tablet Take 1 tablet by mouth daily.    Marland Kitchen doxycycline (VIBRA-TABS) 100 MG tablet Take 1 tablet (100 mg total) by mouth 2 (two) times daily. 20 tablet 0  . FLOVENT DISKUS 50 MCG/BLIST diskus inhaler INHALE 1 PUFF INTO THE LUNGS 2 (TWO) TIMES DAILY. 1 Inhaler 11  . glucose  blood (FREESTYLE LITE) test strip 8/day dx 250.01, and lancets 800 each 3  . HYDROcodone-acetaminophen (NORCO) 10-325 MG tablet Take 1-2 tablets by mouth every 6 (six) hours as needed. 30 tablet 0  . Insulin Infusion Pump Supplies (MINIMED INFUSION SET-MMT 398) MISC Inject 1 Device into the skin 3 days. Change every 3 days 30 each 3  . Insulin Infusion Pump Supplies (PARADIGM RESERVOIR ) MISC Inject 1 Device into the skin 3 days. Change every 3 days 30 each 3  . insulin regular (NOVOLIN R) 100 units/mL injection Use in insulin pump--total of 100 units per day. 100 mL 2  . lamoTRIgine (LAMICTAL) 200 MG tablet Take 200 mg by mouth 2 (two) times daily.     . Lancets (FREESTYLE) lancets Use as instructed dx 250.01 800 each 3   . Multiple Vitamin (MULTIVITAMIN) tablet Take 1 tablet by mouth daily.    . ondansetron (ZOFRAN) 4 MG tablet Take 1 tablet (4 mg total) by mouth every 8 (eight) hours as needed for nausea or vomiting. 100 tablet 3  . rizatriptan (MAXALT) 10 MG tablet Take 1 tablet (10 mg total) by mouth as needed for migraine. May repeat in 2 hours if needed 100 tablet 3  . rosuvastatin (CRESTOR) 40 MG tablet TAKE 1 BY MOUTH DAILY AT BEDTIME 90 tablet 3  . Transparent Dressings (OPSITE IV 3000) MISC Inject 1 Device into the skin 3 days. 30 each 3  . VIT C-CHOLECALCIFEROL-ROSE HIP PO Take 1 tablet by mouth daily.     . ziprasidone (GEODON) 60 MG capsule Take 120 mg by mouth at bedtime. Take 2 by mouth at bedtime    . zolpidem (AMBIEN) 10 MG tablet Take 10 mg by mouth at bedtime.  1   No current facility-administered medications on file prior to visit.     Allergies  Allergen Reactions  . Varenicline Tartrate     REACTION: rash    Family History  Problem Relation Age of Onset  . Diabetes Father     (Oral Agents)  . Diabetes Paternal Grandmother   . Hypertension Paternal Grandmother   . Diabetes Paternal Grandfather   . Cancer Paternal Grandfather     colon  . Hypertension Paternal Grandfather     BP 136/80   Pulse 98   Ht 5\' 7"  (1.702 m)   Wt 184 lb (83.5 kg)   SpO2 97%   BMI 28.82 kg/m    Review of Systems She denies hypoglycemia.  She has residual cough since recent URI.  No wheezing or fever.      Objective:   Physical Exam VITAL SIGNS:  See vs page GENERAL: no distress LUNGS:  Clear to auscultation Pulses: dorsalis pedis intact bilat.   MSK: no deformity of the feet CV: no leg edema. Skin:  no ulcer on the feet.  normal color and temp on the feet.  Neuro: sensation is intact to touch on the feet.   There is bilateral onychomycosis of the right great toenail.    A1c=7.7%     Assessment & Plan:  Type 1 DM: worse Onychomycosis, recurrent Acute bronchitis, new.  Patient  is advised the following: Patient Instructions  Please take these settings: basal rate of 0.9 units/hr, 24 hrs per day. Suspend it for 1-2 hrs, if you are active.  mealtime bolus of 1 unit/ 13 grams carbohydrate. however, subtract 1 unit from calculated lunch bolus. Add 8 units to calculated breakfast bolus.  correction bolus (which some people call "sensitivity," or "  insulin sensitivity ratio," or just "isr") of 1 unit for each 40 by which your blood sugar exceeds 100.  I have sent a prescription to your pharmacy, for the toenail fungus blood and urine tests are requested for you today.  We'll let you know about the results. Here is a prescription for cough syrup. Please come back for a follow-up appointment in 3 months.

## 2016-02-24 NOTE — Telephone Encounter (Signed)
Refill rosuvastatin (CRESTOR) 40 MG tablet  [facility] [place], [place] - [id number] [phone] (Phone) [phone] (Fax)

## 2016-02-24 NOTE — Patient Instructions (Addendum)
Please take these settings: basal rate of 0.9 units/hr, 24 hrs per day. Suspend it for 1-2 hrs, if you are active.  mealtime bolus of 1 unit/ 13 grams carbohydrate. however, subtract 1 unit from calculated lunch bolus. Add 8 units to calculated breakfast bolus.  correction bolus (which some people call "sensitivity," or "insulin sensitivity ratio," or just "isr") of 1 unit for each 40 by which your blood sugar exceeds 100.  I have sent a prescription to your pharmacy, for the toenail fungus blood and urine tests are requested for you today.  We'll let you know about the results. Here is a prescription for cough syrup. Please come back for a follow-up appointment in 3 months.

## 2016-02-25 LAB — PTH, INTACT AND CALCIUM
CALCIUM: 9 mg/dL (ref 8.6–10.2)
PTH: 27 pg/mL (ref 14–64)

## 2016-02-26 ENCOUNTER — Other Ambulatory Visit: Payer: Self-pay

## 2016-02-26 MED ORDER — ROSUVASTATIN CALCIUM 40 MG PO TABS: ORAL_TABLET | ORAL | 3 refills | Status: DC

## 2016-03-17 ENCOUNTER — Encounter: Payer: Self-pay | Admitting: Endocrinology

## 2016-03-17 ENCOUNTER — Ambulatory Visit (INDEPENDENT_AMBULATORY_CARE_PROVIDER_SITE_OTHER): Payer: BLUE CROSS/BLUE SHIELD | Admitting: Endocrinology

## 2016-03-17 VITALS — BP 128/76 | HR 75 | Ht 67.0 in | Wt 186.0 lb

## 2016-03-17 DIAGNOSIS — J069 Acute upper respiratory infection, unspecified: Secondary | ICD-10-CM | POA: Diagnosis not present

## 2016-03-17 MED ORDER — CEFUROXIME AXETIL 250 MG PO TABS: 250.0000 mg | ORAL_TABLET | Freq: Two times a day (BID) | ORAL | 0 refills | Status: DC

## 2016-03-17 NOTE — Patient Instructions (Addendum)
I have sent a prescription to your pharmacy, for an antibiotic pill.  I hope you feel better soon.  If you don't feel better by next week, please call back.  

## 2016-03-17 NOTE — Progress Notes (Signed)
Subjective:    Patient ID: Molly [name], female    DOB: 1970/07/12, 46 y.o.   MRN: [id number]  HPI Pt states few weeks of moderate pain at the left ear, but no assoc nasal congestion Past Medical History:  Diagnosis Date  . ALLERGIC RHINITIS CAUSE UNSPECIFIED 03/06/2007  . Bipolar 1 disorder (HCC)   . CONTUSION, ANKLE 08/28/2007  . DEPRESSION 08/03/2006  . DIABETES MELLITUS, TYPE I 08/03/2006  . DM retinopathy (HCC)   . Encounter for long-term (current) use of other medications   . Hyperlipidemia   . MIGRAINE, CHRONIC 03/06/2007  . Osteoporosis   . SEBACEOUS CYST, INFECTED 07/13/2007  . SMOKER 08/11/2009  . THROMBOCYTOPENIA 05/07/2008  . TINEA VERSICOLOR 07/13/2007    Past Surgical History:  Procedure Laterality Date  . Abnormal U/S  12/10/1996  . ELECTROCARDIOGRAM  06/13/2006  . TUBAL LIGATION      Social History   Social History  . Marital status: Widowed    Spouse name: N/A  . Number of children: N/A  . Years of education: N/A   Occupational History  . Not on file.   Social History Main Topics  . Smoking status: Former Smoker    Quit date: 10/16/2010  . Smokeless tobacco: Never Used  . Alcohol use No  . Drug use: No  . Sexual activity: Yes     Comment: tubal ligation   Other Topics Concern  . Not on file   Social History Narrative   Does not work outside the home    Current Outpatient Prescriptions on File Prior to Visit  Medication Sig Dispense Refill  . Alcohol Swabs (ALCOHOL PREP) 70 % PADS 8/day  250.01 800 each 3  . alendronate (FOSAMAX) 70 MG tablet TAKE 1 TABLET BY MOUTH EVERY 7 DAYS. TAKE WITH FULL GLASS OF WATER ON AN EMPTY STOMACH 12 tablet 2  . Calcium Carbonate (CALCIUM 600 PO) Take 1 tablet by mouth 2 (two) times daily.     . carbamazepine (TEGRETOL) 200 MG tablet Take 400-800 mg by mouth 2 (two) times daily. 2 tablets in the am and 4 tablets in the pm    . cetirizine-pseudoephedrine (ZYRTEC-D) 5-120 MG tablet Take 1 tablet by mouth daily.      Marland Kitchen. FLOVENT DISKUS 50 MCG/BLIST diskus inhaler INHALE 1 PUFF INTO THE LUNGS 2 (TWO) TIMES DAILY. 1 Inhaler 11  . glucose blood (FREESTYLE LITE) test strip 8/day dx 250.01, and lancets 800 each 3  . Insulin Infusion Pump Supplies (MINIMED INFUSION SET-MMT 398) MISC Inject 1 Device into the skin 3 days. Change every 3 days 30 each 3  . Insulin Infusion Pump Supplies (PARADIGM RESERVOIR 3ML) MISC Inject 1 Device into the skin 3 days. Change every 3 days 30 each 3  . insulin regular (NOVOLIN R) 100 units/mL injection Use in insulin pump--total of 100 units per day. 100 mL 2  . lamoTRIgine (LAMICTAL) 200 MG tablet Take 200 mg by mouth 2 (two) times daily.     . Lancets (FREESTYLE) lancets Use as instructed dx 250.01 800 each 3  . Multiple Vitamin (MULTIVITAMIN) tablet Take 1 tablet by mouth daily.    . ondansetron (ZOFRAN) 4 MG tablet Take 1 tablet (4 mg total) by mouth every 8 (eight) hours as needed for nausea or vomiting. 100 tablet 3  . rizatriptan (MAXALT) 10 MG tablet Take 1 tablet (10 mg total) by mouth as needed for migraine. May repeat in 2 hours if needed 100 tablet 3  .  rosuvastatin (CRESTOR) 40 MG tablet TAKE 1 BY MOUTH DAILY AT BEDTIME 90 tablet 3  . terbinafine (LAMISIL) 250 MG tablet Take 1 tablet (250 mg total) by mouth daily. 90 tablet 0  . Transparent Dressings (OPSITE IV 3000) MISC Inject 1 Device into the skin 3 days. 30 each 3  . VIT C-CHOLECALCIFEROL-ROSE HIP PO Take 1 tablet by mouth daily.     . ziprasidone (GEODON) 60 MG capsule Take 120 mg by mouth at bedtime. Take 2 by mouth at bedtime    . zolpidem (AMBIEN) 10 MG tablet Take 10 mg by mouth at bedtime.  1   No current facility-administered medications on file prior to visit.     Allergies  Allergen Reactions  . Varenicline Tartrate     REACTION: rash    Family History  Problem Relation Age of Onset  . Diabetes Father     (Oral Agents)  . Diabetes Paternal Grandmother   . Hypertension Paternal Grandmother   .  Diabetes Paternal Grandfather   . Cancer Paternal Grandfather     colon  . Hypertension Paternal Grandfather     BP 128/76   Pulse 75   Ht 5\' 7"  (1.702 m)   Wt 186 lb (84.4 kg)   SpO2 98%   BMI 29.13 kg/m    Review of Systems Denies sore throat and fever    Objective:   Physical Exam VITAL SIGNS:  See vs page GENERAL: no distress head: no deformity  eyes: no periorbital swelling, no proptosis  external nose and ears are normal  mouth: no lesion seen left tm's is red.  Right is normal.      Assessment & Plan:  URI: new Patient is advised the following: Patient Instructions  I have sent a prescription to your pharmacy, for an antibiotic pill. I hope you feel better soon.  If you don't feel better by next week, please call back.

## 2016-03-24 ENCOUNTER — Encounter: Payer: Self-pay | Admitting: Endocrinology

## 2016-03-24 ENCOUNTER — Ambulatory Visit (INDEPENDENT_AMBULATORY_CARE_PROVIDER_SITE_OTHER): Payer: BLUE CROSS/BLUE SHIELD | Admitting: Endocrinology

## 2016-03-24 VITALS — BP 136/70 | HR 96 | Ht 67.0 in | Wt 189.0 lb

## 2016-03-24 DIAGNOSIS — J069 Acute upper respiratory infection, unspecified: Secondary | ICD-10-CM

## 2016-03-24 MED ORDER — AMOXICILLIN-POT CLAVULANATE 875-125 MG PO TABS: 1.0000 | ORAL_TABLET | Freq: Two times a day (BID) | ORAL | 0 refills | Status: AC

## 2016-03-24 NOTE — Patient Instructions (Signed)
I have sent a prescription to your pharmacy, for a different antibiotic. I hope you feel better soon.  If you don't feel better by next week, please call back, so I can request for you to see an ear-nose-throat specialist.

## 2016-03-24 NOTE — Progress Notes (Signed)
Subjective:    Patient ID: Molly [name], female    DOB: 09/24/1970, 46 y.o.   MRN: [id number]  HPI Moderate left ear pain persists. She has had x a few weeks.  No assoc nasal congestion.   Past Medical History:  Diagnosis Date  . ALLERGIC RHINITIS CAUSE UNSPECIFIED 03/06/2007  . Bipolar 1 disorder (HCC)   . CONTUSION, ANKLE 08/28/2007  . DEPRESSION 08/03/2006  . DIABETES MELLITUS, TYPE I 08/03/2006  . DM retinopathy (HCC)   . Encounter for long-term (current) use of other medications   . Hyperlipidemia   . MIGRAINE, CHRONIC 03/06/2007  . Osteoporosis   . SEBACEOUS CYST, INFECTED 07/13/2007  . SMOKER 08/11/2009  . THROMBOCYTOPENIA 05/07/2008  . TINEA VERSICOLOR 07/13/2007    Past Surgical History:  Procedure Laterality Date  . Abnormal U/S  12/10/1996  . ELECTROCARDIOGRAM  06/13/2006  . TUBAL LIGATION      Social History   Social History  . Marital status: Widowed    Spouse name: N/A  . Number of children: N/A  . Years of education: N/A   Occupational History  . Not on file.   Social History Main Topics  . Smoking status: Former Smoker    Quit date: 10/16/2010  . Smokeless tobacco: Never Used  . Alcohol use No  . Drug use: No  . Sexual activity: Yes     Comment: tubal ligation   Other Topics Concern  . Not on file   Social History Narrative   Does not work outside the home    Current Outpatient Prescriptions on File Prior to Visit  Medication Sig Dispense Refill  . Alcohol Swabs (ALCOHOL PREP) 70 % PADS 8/day  250.01 800 each 3  . alendronate (FOSAMAX) 70 MG tablet TAKE 1 TABLET BY MOUTH EVERY 7 DAYS. TAKE WITH FULL GLASS OF WATER ON AN EMPTY STOMACH 12 tablet 2  . Calcium Carbonate (CALCIUM 600 PO) Take 1 tablet by mouth 2 (two) times daily.     . carbamazepine (TEGRETOL) 200 MG tablet Take 400-800 mg by mouth 2 (two) times daily. 2 tablets in the am and 4 tablets in the pm    . cetirizine-pseudoephedrine (ZYRTEC-D) 5-120 MG tablet Take 1 tablet by mouth  daily.    Marland Kitchen FLOVENT DISKUS 50 MCG/BLIST diskus inhaler INHALE 1 PUFF INTO THE LUNGS 2 (TWO) TIMES DAILY. 1 Inhaler 11  . glucose blood (FREESTYLE LITE) test strip 8/day dx 250.01, and lancets 800 each 3  . Insulin Infusion Pump Supplies (MINIMED INFUSION SET-MMT 398) MISC Inject 1 Device into the skin 3 days. Change every 3 days 30 each 3  . Insulin Infusion Pump Supplies (PARADIGM RESERVOIR ) MISC Inject 1 Device into the skin 3 days. Change every 3 days 30 each 3  . insulin regular (NOVOLIN R) 100 units/mL injection Use in insulin pump--total of 100 units per day. 100 mL 2  . lamoTRIgine (LAMICTAL) 200 MG tablet Take 200 mg by mouth 2 (two) times daily.     . Lancets (FREESTYLE) lancets Use as instructed dx 250.01 800 each 3  . Multiple Vitamin (MULTIVITAMIN) tablet Take 1 tablet by mouth daily.    . ondansetron (ZOFRAN) 4 MG tablet Take 1 tablet (4 mg total) by mouth every 8 (eight) hours as needed for nausea or vomiting. 100 tablet 3  . rizatriptan (MAXALT) 10 MG tablet Take 1 tablet (10 mg total) by mouth as needed for migraine. May repeat in 2 hours if needed 100 tablet 3  .  rosuvastatin (CRESTOR) 40 MG tablet TAKE 1 BY MOUTH DAILY AT BEDTIME 90 tablet 3  . terbinafine (LAMISIL) 250 MG tablet Take 1 tablet (250 mg total) by mouth daily. 90 tablet 0  . Transparent Dressings (OPSITE IV 3000) MISC Inject 1 Device into the skin 3 days. 30 each 3  . VIT C-CHOLECALCIFEROL-ROSE HIP PO Take 1 tablet by mouth daily.     . ziprasidone (GEODON) 60 MG capsule Take 120 mg by mouth at bedtime. Take 2 by mouth at bedtime    . zolpidem (AMBIEN) 10 MG tablet Take 10 mg by mouth at bedtime.  1   No current facility-administered medications on file prior to visit.     Allergies  Allergen Reactions  . Varenicline Tartrate     REACTION: rash    Family History  Problem Relation Age of Onset  . Diabetes Father     (Oral Agents)  . Diabetes Paternal Grandmother   . Hypertension Paternal Grandmother    . Diabetes Paternal Grandfather   . Cancer Paternal Grandfather     colon  . Hypertension Paternal Grandfather     BP 136/70   Pulse 96   Ht 5\' 7"  (1.702 m)   Wt 189 lb (85.7 kg)   SpO2 96%   BMI 29.60 kg/m    Review of Systems Denies fever    Objective:   Physical Exam VITAL SIGNS:  See vs page GENERAL: no distress Ears: both TM's are slightly red.       Assessment & Plan:  AOM, not better yet. Patient is advised the following: Patient Instructions  I have sent a prescription to your pharmacy, for a different antibiotic. I hope you feel better soon.  If you don't feel better by next week, please call back, so I can request for you to see an ear-nose-throat specialist.

## 2016-03-30 ENCOUNTER — Ambulatory Visit (INDEPENDENT_AMBULATORY_CARE_PROVIDER_SITE_OTHER): Payer: BLUE CROSS/BLUE SHIELD | Admitting: Endocrinology

## 2016-03-30 DIAGNOSIS — H9203 Otalgia, bilateral: Secondary | ICD-10-CM

## 2016-03-30 NOTE — Progress Notes (Signed)
Subjective:    Patient ID: Molly [name], female    DOB: 01-09-1971, 46 y.o.   MRN: [id number]  HPI Pt says otalgia is now bilateral.  No fever Past Medical History:  Diagnosis Date  . ALLERGIC RHINITIS CAUSE UNSPECIFIED 03/06/2007  . Bipolar 1 disorder (HCC)   . CONTUSION, ANKLE 08/28/2007  . DEPRESSION 08/03/2006  . DIABETES MELLITUS, TYPE I 08/03/2006  . DM retinopathy (HCC)   . Encounter for long-term (current) use of other medications   . Hyperlipidemia   . MIGRAINE, CHRONIC 03/06/2007  . Osteoporosis   . SEBACEOUS CYST, INFECTED 07/13/2007  . SMOKER 08/11/2009  . THROMBOCYTOPENIA 05/07/2008  . TINEA VERSICOLOR 07/13/2007    Past Surgical History:  Procedure Laterality Date  . Abnormal U/S  12/10/1996  . ELECTROCARDIOGRAM  06/13/2006  . TUBAL LIGATION      Social History   Social History  . Marital status: Widowed    Spouse name: N/A  . Number of children: N/A  . Years of education: N/A   Occupational History  . Not on file.   Social History Main Topics  . Smoking status: Former Smoker    Quit date: 10/16/2010  . Smokeless tobacco: Never Used  . Alcohol use No  . Drug use: No  . Sexual activity: Yes     Comment: tubal ligation   Other Topics Concern  . Not on file   Social History Narrative   Does not work outside the home    Current Outpatient Prescriptions on File Prior to Visit  Medication Sig Dispense Refill  . Alcohol Swabs (ALCOHOL PREP) 70 % PADS 8/day  250.01 800 each 3  . alendronate (FOSAMAX) 70 MG tablet TAKE 1 TABLET BY MOUTH EVERY 7 DAYS. TAKE WITH FULL GLASS OF WATER ON AN EMPTY STOMACH 12 tablet 2  . amoxicillin-clavulanate (AUGMENTIN) 875-125 MG tablet Take 1 tablet by mouth every 12 (twelve) hours. 14 tablet 0  . Calcium Carbonate (CALCIUM 600 PO) Take 1 tablet by mouth 2 (two) times daily.     . carbamazepine (TEGRETOL) 200 MG tablet Take 400-800 mg by mouth 2 (two) times daily. 2 tablets in the am and 4 tablets in the pm    .  cetirizine-pseudoephedrine (ZYRTEC-D) 5-120 MG tablet Take 1 tablet by mouth daily.    Marland Kitchen. FLOVENT DISKUS 50 MCG/BLIST diskus inhaler INHALE 1 PUFF INTO THE LUNGS 2 (TWO) TIMES DAILY. 1 Inhaler 11  . glucose blood (FREESTYLE LITE) test strip 8/day dx 250.01, and lancets 800 each 3  . Insulin Infusion Pump Supplies (MINIMED INFUSION SET-MMT 398) MISC Inject 1 Device into the skin 3 days. Change every 3 days 30 each 3  . Insulin Infusion Pump Supplies (PARADIGM RESERVOIR 3ML) MISC Inject 1 Device into the skin 3 days. Change every 3 days 30 each 3  . insulin regular (NOVOLIN R) 100 units/mL injection Use in insulin pump--total of 100 units per day. 100 mL 2  . lamoTRIgine (LAMICTAL) 200 MG tablet Take 200 mg by mouth 2 (two) times daily.     . Lancets (FREESTYLE) lancets Use as instructed dx 250.01 800 each 3  . Multiple Vitamin (MULTIVITAMIN) tablet Take 1 tablet by mouth daily.    . ondansetron (ZOFRAN) 4 MG tablet Take 1 tablet (4 mg total) by mouth every 8 (eight) hours as needed for nausea or vomiting. 100 tablet 3  . rizatriptan (MAXALT) 10 MG tablet Take 1 tablet (10 mg total) by mouth as needed for migraine. May  repeat in 2 hours if needed 100 tablet 3  . rosuvastatin (CRESTOR) 40 MG tablet TAKE 1 BY MOUTH DAILY AT BEDTIME 90 tablet 3  . terbinafine (LAMISIL) 250 MG tablet Take 1 tablet (250 mg total) by mouth daily. 90 tablet 0  . Transparent Dressings (OPSITE IV 3000) MISC Inject 1 Device into the skin 3 days. 30 each 3  . VIT C-CHOLECALCIFEROL-ROSE HIP PO Take 1 tablet by mouth daily.     . ziprasidone (GEODON) 60 MG capsule Take 120 mg by mouth at bedtime. Take 2 by mouth at bedtime    . zolpidem (AMBIEN) 10 MG tablet Take 10 mg by mouth at bedtime.  1   No current facility-administered medications on file prior to visit.     Allergies  Allergen Reactions  . Varenicline Tartrate     REACTION: rash    Family History  Problem Relation Age of Onset  . Diabetes Father     (Oral  Agents)  . Diabetes Paternal Grandmother   . Hypertension Paternal Grandmother   . Diabetes Paternal Grandfather   . Cancer Paternal Grandfather     colon  . Hypertension Paternal Grandfather     BP (!) 152/82   Pulse 86   Ht 5\' 7"  (1.702 m)   Wt 185 lb (83.9 kg)   SpO2 97%   BMI 28.98 kg/m    Review of Systems Denies nasal congestion    Objective:   Physical Exam VITAL SIGNS:  See vs page GENERAL: no distress Both eac's and tm's are normal.      Assessment & Plan:  otalgia, worse.  Ref ENT.

## 2016-03-30 NOTE — Patient Instructions (Signed)
Please see an ear-nose-throat specialist.  you will receive a phone call, about a day and time for an appointment.  

## 2016-05-20 ENCOUNTER — Ambulatory Visit (INDEPENDENT_AMBULATORY_CARE_PROVIDER_SITE_OTHER): Payer: BLUE CROSS/BLUE SHIELD | Admitting: Endocrinology

## 2016-05-20 ENCOUNTER — Encounter: Payer: Self-pay | Admitting: Endocrinology

## 2016-05-20 ENCOUNTER — Other Ambulatory Visit: Payer: Self-pay | Admitting: Endocrinology

## 2016-05-20 VITALS — BP 138/64 | HR 79 | Ht 67.0 in | Wt 189.0 lb

## 2016-05-20 DIAGNOSIS — E1010 Type 1 diabetes mellitus with ketoacidosis without coma: Secondary | ICD-10-CM | POA: Diagnosis not present

## 2016-05-20 LAB — POCT GLYCOSYLATED HEMOGLOBIN (HGB A1C): Hemoglobin A1C: 7.7

## 2016-05-20 NOTE — Progress Notes (Signed)
Subjective:    Patient ID: Molly [name], female    DOB: [birth year], 46 y.o.   MRN: [id number]  HPI Pt returns for f/u of diabetes mellitus: DM type: 1 Dx'ed: 1983 Complications: none Therapy: insulin since dx DKA: last episode was in early 2016--pt says was due to a bad bottle of insulin.   Severe hypoglycemia: last episode was many years ago.  Pancreatitis: never.  Other: she has a paradigm insulin pump; she declines continuous glucose monitor, due to cost Interval history:  She takes these pump settings:  basal rate of 0.9 units/hr, 24 hrs per day. She suspends for 1-2 hrs, if active. mealtime bolus of 1 unit/ 13 grams carbohydrate. however, she subtracts 1 unit from calculated lunch bolus. Adds 8 units to calculated breakfast bolus.  correction bolus (which some people call "sensitivity," or "insulin sensitivity ratio," or just "isr") of 1 unit for each 40 by which cbg exceeds 100.  She averages approx 50 total units per day.   no cbg record, but states cbg's are mildly low approx twice a week  It is highest after breakfast or AM coffee (300-500).  She says fasting cbg's are still in the low to mid-100's. She says it is still lowest in the afternoon. She requires maxalt approx 1-2 times per week.  She is satisfied with rx.   Past Medical History:  Diagnosis Date  . ALLERGIC RHINITIS CAUSE UNSPECIFIED 03/06/2007  . Bipolar 1 disorder (HCC)   . CONTUSION, ANKLE 08/28/2007  . DEPRESSION 08/03/2006  . DIABETES MELLITUS, TYPE I 08/03/2006  . DM retinopathy (HCC)   . Encounter for long-term (current) use of other medications   . Hyperlipidemia   . MIGRAINE, CHRONIC 03/06/2007  . Osteoporosis   . SEBACEOUS CYST, INFECTED 07/13/2007  . SMOKER 08/11/2009  . THROMBOCYTOPENIA 05/07/2008  . TINEA VERSICOLOR 07/13/2007    Past Surgical History:  Procedure Laterality Date  . Abnormal U/S  12/10/1996  . ELECTROCARDIOGRAM  06/13/2006  . TUBAL LIGATION      Social History    Social History  . Marital status: Widowed    Spouse name: N/A  . Number of children: N/A  . Years of education: N/A   Occupational History  . Not on file.   Social History Main Topics  . Smoking status: Former Smoker    Quit date: 10/16/2010  . Smokeless tobacco: Never Used  . Alcohol use No  . Drug use: No  . Sexual activity: Yes     Comment: tubal ligation   Other Topics Concern  . Not on file   Social History Narrative   Does not work outside the home    Current Outpatient Prescriptions on File Prior to Visit  Medication Sig Dispense Refill  . Alcohol Swabs (ALCOHOL PREP) 70 % PADS 8/day  250.01 800 each 3  . alendronate (FOSAMAX) 70 MG tablet TAKE 1 TABLET BY MOUTH EVERY 7 DAYS. TAKE WITH FULL GLASS OF WATER ON AN EMPTY STOMACH 12 tablet 2  . Calcium Carbonate (CALCIUM 600 PO) Take 1 tablet by mouth 2 (two) times daily.     . carbamazepine (TEGRETOL) 200 MG tablet Take 400-800 mg by mouth 2 (two) times daily. 2 tablets in the am and 4 tablets in the pm    . cetirizine-pseudoephedrine (ZYRTEC-D) 5-120 MG tablet Take 1 tablet by mouth daily.    Marland Kitchen FLOVENT DISKUS 50 MCG/BLIST diskus inhaler INHALE 1 PUFF INTO THE LUNGS 2 (TWO) TIMES DAILY. 1 Inhaler 11  .  glucose blood (FREESTYLE LITE) test strip 8/day dx 250.01, and lancets 800 each 3  . Insulin Infusion Pump Supplies (MINIMED INFUSION SET-MMT 398) MISC Inject 1 Device into the skin 3 days. Change every 3 days 30 each 3  . Insulin Infusion Pump Supplies (PARADIGM RESERVOIR ) MISC Inject 1 Device into the skin 3 days. Change every 3 days 30 each 3  . lamoTRIgine (LAMICTAL) 200 MG tablet Take 200 mg by mouth 2 (two) times daily.     . Lancets (FREESTYLE) lancets Use as instructed dx 250.01 800 each 3  . Multiple Vitamin (MULTIVITAMIN) tablet Take 1 tablet by mouth daily.    . ondansetron (ZOFRAN) 4 MG tablet Take 1 tablet (4 mg total) by mouth every 8 (eight) hours as needed for nausea or vomiting. 100 tablet 3  .  rizatriptan (MAXALT) 10 MG tablet Take 1 tablet (10 mg total) by mouth as needed for migraine. May repeat in 2 hours if needed 100 tablet 3  . rosuvastatin (CRESTOR) 40 MG tablet TAKE 1 BY MOUTH DAILY AT BEDTIME 90 tablet 3  . terbinafine (LAMISIL) 250 MG tablet Take 1 tablet (250 mg total) by mouth daily. 90 tablet 0  . Transparent Dressings (OPSITE IV 3000) MISC Inject 1 Device into the skin 3 days. 30 each 3  . VIT C-CHOLECALCIFEROL-ROSE HIP PO Take 1 tablet by mouth daily.     . ziprasidone (GEODON) 60 MG capsule Take 120 mg by mouth at bedtime. Take 2 by mouth at bedtime    . zolpidem (AMBIEN) 10 MG tablet Take 10 mg by mouth at bedtime.  1   No current facility-administered medications on file prior to visit.     Allergies  Allergen Reactions  . Varenicline Tartrate     REACTION: rash    Family History  Problem Relation Age of Onset  . Diabetes Father     (Oral Agents)  . Diabetes Paternal Grandmother   . Hypertension Paternal Grandmother   . Diabetes Paternal Grandfather   . Cancer Paternal Grandfather     colon  . Hypertension Paternal Grandfather     BP 138/64   Pulse 79   Ht  (1.702 m)   Wt 189 lb (85.7 kg)   SpO2 97%   BMI 29.60 kg/m    Review of Systems Denies LOC.     Objective:   Physical Exam VITAL SIGNS:  See vs page GENERAL: no distress.  Pulses: dorsalis pedis intact bilat.   MSK: no deformity of the feet CV: no leg edema. Skin:  no ulcer on the feet.  normal color and temp on the feet.  Neuro: sensation is intact to touch on the feet.   There is onychomycosis of the right great toenail, but much better at the cuticle.      Assessment & Plan:  onychomycosis of the toenails, improved with rx. She may need another course of lamisil.  We'll follow.  Migraine: well-controlled.  Please continue the same medication.  Type 1 DM: she declines to increase rx now.  Patient Instructions  Please take these settings:  basal rate of 0.9 units/hr,  24 hrs per day. Suspend it for 1-2 hrs, if you are active.  mealtime bolus of 1 unit/ 13 grams carbohydrate. however, subtract 2 units from calculated lunch bolus. Add 10 units to calculated breakfast bolus.  correction bolus (which some people call "sensitivity," or "insulin sensitivity ratio," or just "isr") of 1 unit for each 40 by which your blood  sugar exceeds 100.  Your toenail fungus will continue to improve. Please continue the same as-needed maxalt.  Please come back for a follow-up appointment in 3 months.

## 2016-05-20 NOTE — Patient Instructions (Addendum)
Please take these settings:  basal rate of 0.9 units/hr, 24 hrs per day. Suspend it for 1-2 hrs, if you are active.  mealtime bolus of 1 unit/ 13 grams carbohydrate. however, subtract 2 units from calculated lunch bolus. Add 10 units to calculated breakfast bolus.  correction bolus (which some people call "sensitivity," or "insulin sensitivity ratio," or just "isr") of 1 unit for each 40 by which your blood sugar exceeds 100.  Your toenail fungus will continue to improve. Please continue the same as-needed maxalt.  Please come back for a follow-up appointment in 3 months.

## 2016-06-08 ENCOUNTER — Encounter: Payer: Self-pay | Admitting: Gynecology

## 2016-06-24 ENCOUNTER — Other Ambulatory Visit: Payer: Self-pay

## 2016-06-24 MED ORDER — ROSUVASTATIN CALCIUM 40 MG PO TABS: ORAL_TABLET | ORAL | 3 refills | Status: DC

## 2016-07-25 ENCOUNTER — Encounter: Payer: Self-pay | Admitting: Obstetrics & Gynecology

## 2016-07-25 ENCOUNTER — Ambulatory Visit: Payer: Self-pay | Admitting: Gynecology

## 2016-07-25 ENCOUNTER — Ambulatory Visit (INDEPENDENT_AMBULATORY_CARE_PROVIDER_SITE_OTHER): Payer: BLUE CROSS/BLUE SHIELD | Admitting: Obstetrics & Gynecology

## 2016-07-25 VITALS — BP 140/86 | Ht 67.0 in | Wt 188.0 lb

## 2016-07-25 DIAGNOSIS — L723 Sebaceous cyst: Secondary | ICD-10-CM | POA: Diagnosis not present

## 2016-07-25 DIAGNOSIS — B373 Candidiasis of vulva and vagina: Secondary | ICD-10-CM

## 2016-07-25 DIAGNOSIS — M8580 Other specified disorders of bone density and structure, unspecified site: Secondary | ICD-10-CM

## 2016-07-25 DIAGNOSIS — L089 Local infection of the skin and subcutaneous tissue, unspecified: Secondary | ICD-10-CM

## 2016-07-25 DIAGNOSIS — Z01411 Encounter for gynecological examination (general) (routine) with abnormal findings: Secondary | ICD-10-CM

## 2016-07-25 DIAGNOSIS — Z1151 Encounter for screening for human papillomavirus (HPV): Secondary | ICD-10-CM | POA: Diagnosis not present

## 2016-07-25 DIAGNOSIS — Z9851 Tubal ligation status: Secondary | ICD-10-CM

## 2016-07-25 DIAGNOSIS — B3731 Acute candidiasis of vulva and vagina: Secondary | ICD-10-CM

## 2016-07-25 MED ORDER — TERCONAZOLE 0.8 % VA CREA: 1.0000 | TOPICAL_CREAM | Freq: Every day | VAGINAL | 2 refills | Status: AC

## 2016-07-25 NOTE — Patient Instructions (Signed)
1. Encounter for gynecological examination with abnormal finding Normal gyn exam except for a Sebaceous Gland Abscess on left pubis and Yeast vulvovaginitis.  Pap/HPV done.  Breasts wnl.  2. Infected sebaceous cyst Lanced under sterile conditions and local anesthesia.  Recommend warm soaking.  Apply Neosporin cream as needed.  3. Vulvovaginitis due to yeast Terazol 3 generic prescribed.  Can try Probiotic tab or suppository vaginally every week for prevention.  4. Osteopenia, unspecified location BD >5 years ago.  Osteopenia per patient, on Fosamax.  Will repeat BD.  Vit. D supplement, Ca ++ in Nutrition, weight bearing physical activity.  5. Tubal ligation status   Molly [name], it was a pleasure to meet you today!  I will inform you of your results as soon as available.

## 2016-07-25 NOTE — Progress Notes (Signed)
Molly [name] July 01, 1970 [id number]   History:    46 y.o.G4P2A2 Widowed x 5 yrs.  Daughter 75 yo and son 24 yo.  From [place] originally.  At home currently.  RP:  New patient presenting for annual gyn exam  HPI:  Menses normal but every 1-3 months.  Moderate flow.  No pelvic pain.  C/O vulvar itching, probable yeast infection per patient.  No odor.  Abstinent.  C/O vulvar boil x 2 weeks, drains a little at times, but didn't go away completely in spite of warm soaking.  Frequent recurrences at this locations in the past.  Breasts wnl.   Past medical history,surgical history, family history and social history were all reviewed and documented in the EPIC chart.  Gynecologic History No LMP recorded. Contraception: tubal ligation Last Pap: 2013. Results were: Normal per patient Last mammogram: 11/2015. Results were: normal per patient BD Osteopenia >5 yrs per patient  Obstetric History OB History  Gravida Para Term Preterm AB Living  4 2 1 1 2 2   SAB TAB Ectopic Multiple Live Births  2       2    # Outcome Date GA Lbr Len/2nd Weight Sex Delivery Anes PTL Lv  4 SAB           3 SAB           2 Term     M   N LIV  1 Preterm     F CS-Unspec  Y LIV       ROS: A ROS was performed and pertinent positives and negatives are included in the history.  GENERAL: No fevers or chills. HEENT: No change in vision, no earache, sore throat or sinus congestion. NECK: No pain or stiffness. CARDIOVASCULAR: No chest pain or pressure. No palpitations. PULMONARY: No shortness of breath, cough or wheeze. GASTROINTESTINAL: No abdominal pain, nausea, vomiting or diarrhea, melena or bright red blood per rectum. GENITOURINARY: No urinary frequency, urgency, hesitancy or dysuria. MUSCULOSKELETAL: No joint or muscle pain, no back pain, no recent trauma. DERMATOLOGIC: No rash, no itching, no lesions. ENDOCRINE: No polyuria, polydipsia, no heat or cold intolerance. No recent change in weight. HEMATOLOGICAL: No  anemia or easy bruising or bleeding. NEUROLOGIC: No headache, seizures, numbness, tingling or weakness. PSYCHIATRIC: No depression, no loss of interest in normal activity or change in sleep pattern.     Exam:   BP 140/86   Ht 5\' 7"  (1.702 m)   Wt 188 lb (85.3 kg)   BMI 29.44 kg/m   Body mass index is 29.44 kg/m.  General appearance : Well developed well nourished female. No acute distress HEENT: Eyes: no retinal hemorrhage or exudates,  Neck supple, trachea midline, no carotid bruits, no thyroidmegaly Lungs: Clear to auscultation, no rhonchi or wheezes, or rib retractions  Heart: Regular rate and rhythm, no murmurs or gallops Breast:Examined in sitting and supine position were symmetrical in appearance, no palpable masses or tenderness,  no skin retraction, no nipple inversion, no nipple discharge, no skin discoloration, no axillary or supraclavicular lymphadenopathy Abdomen: no palpable masses or tenderness, no rebound or guarding Extremities: no edema or skin discoloration or tenderness  Pelvic:   Pubis:  Left side with a large Sebaceous gland abscess:  Betadine prep, Lidocaine 1% local, lanced with a scalpel.  Pus evacuated +++.  Irrigated with sterile water.  Dressing applied.              Vulva normal.  Bartholin, Urethra, Skene Glands: Within  normal limits             Vagina: No gross lesions.  Thick yeasty discharge present.  Cervix: No gross lesions or discharge.  Pap/HPV done.  Uterus  AV, normal size, shape and consistency, non-tender and mobile  Adnexa  Without masses or tenderness  Anus and perineum  Normal except non-thrombosed hemorrhoid.     Assessment/Plan:  [age] y.o. female for annual exam   1. Encounter for gynecological examination with abnormal finding Normal gyn exam except for a Sebaceous Gland Abscess on left pubis and Yeast vulvovaginitis.  Pap/HPV done.  Breasts wnl.  2. Infected sebaceous cyst Lanced under sterile conditions and local anesthesia.   Recommend warm soaking.  Apply Neosporin cream as needed.  3. Vulvovaginitis due to yeast Terazol 3 generic prescribed.  Can try Probiotic tab or suppository vaginally every week for prevention.  4. Osteopenia, unspecified location BD >5 years ago.  Osteopenia per patient, on Fosamax.  Will repeat BD.  Vit. D supplement, Ca ++ in Nutrition, weight bearing physical activity.  5. Tubal ligation status   Counseling >50% x 20 minutes on above issues.  [name] MD, 8:47 AM 07/25/2016

## 2016-07-26 ENCOUNTER — Other Ambulatory Visit: Payer: Self-pay | Admitting: *Deleted

## 2016-07-26 MED ORDER — TRIAMCINOLONE ACETONIDE 0.5 % EX OINT: 1.0000 "application " | TOPICAL_OINTMENT | Freq: Two times a day (BID) | CUTANEOUS | 0 refills | Status: DC

## 2016-07-28 LAB — PAP, TP IMAGING W/ HPV RNA, RFLX HPV TYPE 16,18/45: HPV mRNA, High Risk: NOT DETECTED

## 2016-08-01 ENCOUNTER — Other Ambulatory Visit: Payer: Self-pay

## 2016-08-01 MED ORDER — ALENDRONATE SODIUM 70 MG PO TABS: ORAL_TABLET | ORAL | 2 refills | Status: DC

## 2016-08-12 ENCOUNTER — Other Ambulatory Visit: Payer: Self-pay | Admitting: Endocrinology

## 2016-08-15 ENCOUNTER — Other Ambulatory Visit: Payer: Self-pay | Admitting: Gynecology

## 2016-08-15 DIAGNOSIS — Z13820 Encounter for screening for osteoporosis: Secondary | ICD-10-CM

## 2016-08-25 ENCOUNTER — Other Ambulatory Visit: Payer: Self-pay | Admitting: Gynecology

## 2016-08-25 ENCOUNTER — Ambulatory Visit (INDEPENDENT_AMBULATORY_CARE_PROVIDER_SITE_OTHER): Payer: BLUE CROSS/BLUE SHIELD

## 2016-08-25 DIAGNOSIS — M816 Localized osteoporosis [Lequesne]: Secondary | ICD-10-CM

## 2016-08-25 DIAGNOSIS — M818 Other osteoporosis without current pathological fracture: Secondary | ICD-10-CM | POA: Diagnosis not present

## 2016-08-25 DIAGNOSIS — Z13820 Encounter for screening for osteoporosis: Secondary | ICD-10-CM

## 2016-09-19 ENCOUNTER — Encounter: Payer: Self-pay | Admitting: Endocrinology

## 2016-09-19 ENCOUNTER — Ambulatory Visit (INDEPENDENT_AMBULATORY_CARE_PROVIDER_SITE_OTHER): Payer: BLUE CROSS/BLUE SHIELD | Admitting: Endocrinology

## 2016-09-19 VITALS — BP 126/76 | HR 72 | Ht 67.0 in | Wt 199.0 lb

## 2016-09-19 DIAGNOSIS — E1010 Type 1 diabetes mellitus with ketoacidosis without coma: Secondary | ICD-10-CM

## 2016-09-19 DIAGNOSIS — Z23 Encounter for immunization: Secondary | ICD-10-CM | POA: Diagnosis not present

## 2016-09-19 LAB — POCT GLYCOSYLATED HEMOGLOBIN (HGB A1C): HEMOGLOBIN A1C: 7.4

## 2016-09-19 MED ORDER — INSULIN REGULAR HUMAN 100 UNIT/ML IJ SOLN: INTRAMUSCULAR | 3 refills | Status: DC

## 2016-09-19 NOTE — Patient Instructions (Addendum)
Please take these settings:  basal rate of 0.9 units/hr, 24 hrs per day. Suspend it for 1-2 hrs, if you are active.  mealtime bolus of 1 unit/ 13 grams carbohydrate. however, subtract 3 units from calculated lunch bolus. Add 11 units to calculated breakfast bolus.  correction bolus (which some people call "sensitivity," or "insulin sensitivity ratio," or just "isr") of 1 unit for each 40 by which your blood sugar exceeds 100.  Your toenail fungus will continue to improve. Please come back for a regular physical appointment in 3 months.

## 2016-09-19 NOTE — Progress Notes (Signed)
Subjective:    Patient ID: Molly [name], female    DOB: 03/31/1970, 46 y.o.   MRN: [id number]  HPI Pt returns for f/u of diabetes mellitus: DM type: 1 Dx'ed: 1983 Complications: none Therapy: insulin since dx DKA: last episode was in early 2016--pt says was due to a bad bottle of insulin.   Severe hypoglycemia: last episode was many years ago.  Pancreatitis: never.  Other: she has a paradigm insulin pump; she declines continuous glucose monitor, due to cost Interval history:  She takes these pump settings:  basal rate of 0.9 units/hr, 24 hrs per day. She suspends for 1-2 hrs, if active. mealtime bolus of 1 unit/ 13 grams carbohydrate. however, she subtracts 2 units from calculated lunch bolus. Adds 10 units to calculated breakfast bolus.  correction bolus (which some people call "sensitivity," or "insulin sensitivity ratio," or just "isr") of 1 unit for each 40 by which cbg exceeds 100.  She averages approx 50 total units per day.   she brings a record of her cbg's which I have reviewed today.  It is mildly low approx once a week.   It is still highest after breakfast (200), and lowest in the afternoon. Past Medical History:  Diagnosis Date  . ALLERGIC RHINITIS CAUSE UNSPECIFIED 03/06/2007  . Bipolar 1 disorder (HCC)   . CONTUSION, ANKLE 08/28/2007  . DEPRESSION 08/03/2006  . DIABETES MELLITUS, TYPE I 08/03/2006  . DM retinopathy (HCC)   . Encounter for long-term (current) use of other medications   . Hyperlipidemia   . MIGRAINE, CHRONIC 03/06/2007  . Osteoporosis   . SEBACEOUS CYST, INFECTED 07/13/2007  . SMOKER 08/11/2009  . THROMBOCYTOPENIA 05/07/2008  . TINEA VERSICOLOR 07/13/2007    Past Surgical History:  Procedure Laterality Date  . Abnormal U/S  12/10/1996  . ELECTROCARDIOGRAM  06/13/2006  . TUBAL LIGATION      Social History   Social History  . Marital status: Widowed    Spouse name: N/A  . Number of children: N/A  . Years of education: N/A    Occupational History  . Not on file.   Social History Main Topics  . Smoking status: Former Smoker    Quit date: 10/16/2010  . Smokeless tobacco: Never Used  . Alcohol use No  . Drug use: No  . Sexual activity: No     Comment: tubal ligation   Other Topics Concern  . Not on file   Social History Narrative   Does not work outside the home    Current Outpatient Prescriptions on File Prior to Visit  Medication Sig Dispense Refill  . Alcohol Swabs (ALCOHOL PREP) 70 % PADS 8/day  250.01 800 each 3  . alendronate (FOSAMAX) 70 MG tablet TAKE 1 TABLET BY MOUTH EVERY 7 DAYS. TAKE WITH FULL GLASS OF WATER ON AN EMPTY STOMACH 24 tablet 2  . Calcium Carbonate (CALCIUM 600 PO) Take 1 tablet by mouth 2 (two) times daily.     . carbamazepine (TEGRETOL) 200 MG tablet Take 400-800 mg by mouth 2 (two) times daily. 2 tablets in the am and 4 tablets in the pm    . cetirizine-pseudoephedrine (ZYRTEC-D) 5-120 MG tablet Take 1 tablet by mouth daily.    Marland Kitchen FLOVENT DISKUS 50 MCG/BLIST diskus inhaler INHALE 1 PUFF INTO THE LUNGS 2 (TWO) TIMES DAILY. 1 Inhaler 11  . glucose blood (FREESTYLE LITE) test strip 8/day dx 250.01, and lancets 800 each 3  . Insulin Infusion Pump Supplies (MINIMED INFUSION SET-MMT 398)  MISC Inject 1 Device into the skin 3 days. Change every 3 days 30 each 3  . Insulin Infusion Pump Supplies (PARADIGM RESERVOIR ) MISC Inject 1 Device into the skin 3 days. Change every 3 days 30 each 3  . lamoTRIgine (LAMICTAL) 200 MG tablet Take 200 mg by mouth 2 (two) times daily.     . Lancets (FREESTYLE) lancets Use as instructed dx 250.01 800 each 3  . Multiple Vitamin (MULTIVITAMIN) tablet Take 1 tablet by mouth daily.    . ondansetron (ZOFRAN) 4 MG tablet Take 1 tablet (4 mg total) by mouth every 8 (eight) hours as needed for nausea or vomiting. 100 tablet 3  . rizatriptan (MAXALT) 10 MG tablet Take 1 tablet (10 mg total) by mouth as needed for migraine. May repeat in 2 hours if needed 100  tablet 3  . rosuvastatin (CRESTOR) 40 MG tablet TAKE 1 BY MOUTH DAILY AT BEDTIME 90 tablet 3  . terbinafine (LAMISIL) 250 MG tablet Take 1 tablet (250 mg total) by mouth daily. 90 tablet 0  . Transparent Dressings (OPSITE IV 3000) MISC Inject 1 Device into the skin 3 days. 30 each 3  . triamcinolone ointment (KENALOG) 0.5 % Apply 1 application topically 2 (two) times daily. 30 g 0  . VIT C-CHOLECALCIFEROL-ROSE HIP PO Take 1 tablet by mouth daily.     . ziprasidone (GEODON) 60 MG capsule Take 120 mg by mouth at bedtime. Take 2 by mouth at bedtime    . zolpidem (AMBIEN) 10 MG tablet Take 10 mg by mouth at bedtime.  1   No current facility-administered medications on file prior to visit.     Allergies  Allergen Reactions  . Varenicline Tartrate     REACTION: rash    Family History  Problem Relation Age of Onset  . Diabetes Father        (Oral Agents)  . Diabetes Paternal Grandmother   . Hypertension Paternal Grandmother   . Diabetes Paternal Grandfather   . Cancer Paternal Grandfather        colon  . Hypertension Paternal Grandfather     BP 126/76   Pulse 72   Ht 5\' 7"  (1.702 m)   Wt 199 lb (90.3 kg)   SpO2 98%   BMI 31.17 kg/m    Review of Systems Denies LOC    Objective:   Physical Exam VITAL SIGNS:  See vs page GENERAL: no distress Pulses: foot pulses are intact bilaterally.   MSK: no deformity of the feet or ankles.  CV: no edema of the legs or ankles Skin:  no ulcer on the feet or ankles.  normal color and temp on the feet and ankles Neuro: sensation is intact to touch on the feet and ankles.    A1c=7.4%    Assessment & Plan:  Type 1 DM: Based on the pattern of her cbg's, she needs some adjustment in her therapy  Patient Instructions  Please take these settings:  basal rate of 0.9 units/hr, 24 hrs per day. Suspend it for 1-2 hrs, if you are active.  mealtime bolus of 1 unit/ 13 grams carbohydrate. however, subtract 3 units from calculated lunch  bolus. Add 11 units to calculated breakfast bolus.  correction bolus (which some people call "sensitivity," or "insulin sensitivity ratio," or just "isr") of 1 unit for each 40 by which your blood sugar exceeds 100.  Your toenail fungus will continue to improve. Please come back for a regular physical appointment in 3 months.

## 2016-10-24 ENCOUNTER — Telehealth: Payer: Self-pay | Admitting: Endocrinology

## 2016-10-24 NOTE — Telephone Encounter (Signed)
Patient has sore throat and would like to see Dr. Everardo All today if possible. Patient scheduled an appointment for tomorrow.

## 2016-10-25 ENCOUNTER — Ambulatory Visit: Payer: BLUE CROSS/BLUE SHIELD | Admitting: Endocrinology

## 2016-11-09 ENCOUNTER — Ambulatory Visit (INDEPENDENT_AMBULATORY_CARE_PROVIDER_SITE_OTHER): Payer: BLUE CROSS/BLUE SHIELD | Admitting: Endocrinology

## 2016-11-09 ENCOUNTER — Encounter: Payer: Self-pay | Admitting: Endocrinology

## 2016-11-09 VITALS — BP 134/74 | HR 79 | Wt 201.8 lb

## 2016-11-09 DIAGNOSIS — E1010 Type 1 diabetes mellitus with ketoacidosis without coma: Secondary | ICD-10-CM | POA: Diagnosis not present

## 2016-11-09 LAB — POCT GLYCOSYLATED HEMOGLOBIN (HGB A1C): Hemoglobin A1C: 7.4

## 2016-11-09 MED ORDER — RIZATRIPTAN BENZOATE 10 MG PO TABS: 10.0000 mg | ORAL_TABLET | ORAL | 3 refills | Status: DC | PRN

## 2016-11-09 MED ORDER — ONDANSETRON HCL 4 MG PO TABS: 4.0000 mg | ORAL_TABLET | Freq: Three times a day (TID) | ORAL | 3 refills | Status: DC | PRN

## 2016-11-09 NOTE — Patient Instructions (Addendum)
Please take these settings:  basal rate of 0.8 units/hr, 24 hrs per day. Suspend it for 1-2 hrs, if you are active.  mealtime bolus of 1 unit/ 13 grams carbohydrate. however, subtract 3 units from calculated lunch bolus. Add 11 units to calculated breakfast bolus.  correction bolus (which some people call "sensitivity," or "insulin sensitivity ratio," or just "isr") of 1 unit for each 40 by which your blood sugar exceeds 100.   Keep the area we worked on today, clean, dry, and covered with a large bandaid.   Please come back for a regular physical appointment as scheduled.

## 2016-11-09 NOTE — Progress Notes (Signed)
Subjective:    Patient ID: Molly [name], female    DOB: [birth year], 46 y.o.   MRN: [id number]  HPI  Pt returns for f/u of diabetes mellitus: DM type: 1 Dx'ed: 1983 Complications: none Therapy: insulin since dx DKA: last episode was in early 2016--pt says was due to a bad bottle of insulin.   Severe hypoglycemia: last episode was many years ago.  Pancreatitis: never.  Other: she has a paradigm insulin pump; she declines continuous glucose monitor, due to cost Interval history:  She takes these pump settings:  basal rate of 0.9 units/hr, 24 hrs per day. She suspends for 1-2 hrs, if active. mealtime bolus of 1 unit/ 13 grams carbohydrate. however, she subtracts 2 units from calculated lunch bolus. Adds 10 units to calculated breakfast bolus.  correction bolus (which some people call "sensitivity," or "insulin sensitivity ratio," or just "isr") of 1 unit for each 40 by which cbg exceeds 100.  Pt states many years of a nodule at the intersection of the LUOQ of the left breast, and the axilla.  Over the past 2 days, she has moderate pain there, and assoc increase in size.   Past Medical History:  Diagnosis Date  . ALLERGIC RHINITIS CAUSE UNSPECIFIED 03/06/2007  . Bipolar 1 disorder (HCC)   . CONTUSION, ANKLE 08/28/2007  . DEPRESSION 08/03/2006  . DIABETES MELLITUS, TYPE I 08/03/2006  . DM retinopathy (HCC)   . Encounter for long-term (current) use of other medications   . Hyperlipidemia   . MIGRAINE, CHRONIC 03/06/2007  . Osteoporosis   . SEBACEOUS CYST, INFECTED 07/13/2007  . SMOKER 08/11/2009  . THROMBOCYTOPENIA 05/07/2008  . TINEA VERSICOLOR 07/13/2007    Past Surgical History:  Procedure Laterality Date  . Abnormal U/S  12/10/1996  . ELECTROCARDIOGRAM  06/13/2006  . TUBAL LIGATION      Social History   Social History  . Marital status: Widowed    Spouse name: N/A  . Number of children: N/A  . Years of education: N/A   Occupational History  . Not on file.    Social History Main Topics  . Smoking status: Former Smoker    Quit date: 10/16/2010  . Smokeless tobacco: Never Used  . Alcohol use No  . Drug use: No  . Sexual activity: No     Comment: tubal ligation   Other Topics Concern  . Not on file   Social History Narrative   Does not work outside the home    Current Outpatient Prescriptions on File Prior to Visit  Medication Sig Dispense Refill  . Alcohol Swabs (ALCOHOL PREP) 70 % PADS 8/day  250.01 800 each 3  . alendronate (FOSAMAX) 70 MG tablet TAKE 1 TABLET BY MOUTH EVERY 7 DAYS. TAKE WITH FULL GLASS OF WATER ON AN EMPTY STOMACH 24 tablet 2  . Calcium Carbonate (CALCIUM 600 PO) Take 1 tablet by mouth 2 (two) times daily.     . carbamazepine (TEGRETOL) 200 MG tablet Take 400-800 mg by mouth 2 (two) times daily. 2 tablets in the am and 4 tablets in the pm    . cetirizine-pseudoephedrine (ZYRTEC-D) 5-120 MG tablet Take 1 tablet by mouth daily.    Marland Kitchen. FLOVENT DISKUS 50 MCG/BLIST diskus inhaler INHALE 1 PUFF INTO THE LUNGS 2 (TWO) TIMES DAILY. 1 Inhaler 11  . glucose blood (FREESTYLE LITE) test strip 8/day dx 250.01, and lancets 800 each 3  . Insulin Infusion Pump Supplies (MINIMED INFUSION SET-MMT 398) MISC Inject 1 Device into the  skin 3 days. Change every 3 days 30 each 3  . Insulin Infusion Pump Supplies (PARADIGM RESERVOIR ) MISC Inject 1 Device into the skin 3 days. Change every 3 days 30 each 3  . insulin regular (NOVOLIN R) 100 units/mL injection For use in pump, total of 70 units per day 80 mL 3  . lamoTRIgine (LAMICTAL) 200 MG tablet Take 200 mg by mouth 2 (two) times daily.     . Lancets (FREESTYLE) lancets Use as instructed dx 250.01 800 each 3  . Multiple Vitamin (MULTIVITAMIN) tablet Take 1 tablet by mouth daily.    . rosuvastatin (CRESTOR) 40 MG tablet TAKE 1 BY MOUTH DAILY AT BEDTIME 90 tablet 3  . terbinafine (LAMISIL) 250 MG tablet Take 1 tablet (250 mg total) by mouth daily. 90 tablet 0  . Transparent Dressings  (OPSITE IV 3000) MISC Inject 1 Device into the skin 3 days. 30 each 3  . triamcinolone ointment (KENALOG) 0.5 % Apply 1 application topically 2 (two) times daily. 30 g 0  . VIT C-CHOLECALCIFEROL-ROSE HIP PO Take 1 tablet by mouth daily.     . ziprasidone (GEODON) 60 MG capsule Take 120 mg by mouth at bedtime. Take 2 by mouth at bedtime    . zolpidem (AMBIEN) 10 MG tablet Take 10 mg by mouth at bedtime.  1   No current facility-administered medications on file prior to visit.     Allergies  Allergen Reactions  . Varenicline Tartrate     REACTION: rash    Family History  Problem Relation Age of Onset  . Diabetes Father        (Oral Agents)  . Diabetes Paternal Grandmother   . Hypertension Paternal Grandmother   . Diabetes Paternal Grandfather   . Cancer Paternal Grandfather        colon  . Hypertension Paternal Grandfather     BP 134/74   Pulse 79   Wt 201 lb 12.8 oz (91.5 kg)   SpO2 98%   BMI 31.61 kg/m    Review of Systems Denies fever.  She has mild hypoglycemia approx once per week.     Objective:   Physical Exam VITAL SIGNS:  See vs page GENERAL: no distress Left breast, UOQ: 3 cm area of swell/tend/warmth/erythema   procedure: incision/drainage consent signed/pt agrees.  The proper site is identified and verified with the patient.   local 2% xylocaine with epi prep is with alcohol pad # 15 blade used to I and D the mass.  Sebaceous and purulent material is expressed.   no complications neosporin and large bandaid are applied.    Assessment & Plan:  Infected sebaceous cyst, new.   Type 1 DM, with hypoglycemia.   Patient Instructions  Please take these settings:  basal rate of 0.8 units/hr, 24 hrs per day. Suspend it for 1-2 hrs, if you are active.  mealtime bolus of 1 unit/ 13 grams carbohydrate. however, subtract 3 units from calculated lunch bolus. Add 11 units to calculated breakfast bolus.  correction bolus (which some people call  "sensitivity," or "insulin sensitivity ratio," or just "isr") of 1 unit for each 40 by which your blood sugar exceeds 100.   Keep the area we worked on today, clean, dry, and covered with a large bandaid.   Please come back for a regular physical appointment as scheduled.

## 2016-12-06 ENCOUNTER — Other Ambulatory Visit: Payer: Self-pay

## 2016-12-06 ENCOUNTER — Other Ambulatory Visit: Payer: Self-pay | Admitting: Endocrinology

## 2016-12-08 ENCOUNTER — Other Ambulatory Visit: Payer: Self-pay

## 2016-12-08 MED ORDER — ALENDRONATE SODIUM 70 MG PO TABS: ORAL_TABLET | ORAL | 11 refills | Status: DC

## 2016-12-20 ENCOUNTER — Ambulatory Visit: Payer: BLUE CROSS/BLUE SHIELD | Admitting: Endocrinology

## 2016-12-20 ENCOUNTER — Encounter: Payer: Self-pay | Admitting: Endocrinology

## 2016-12-20 ENCOUNTER — Other Ambulatory Visit: Payer: Self-pay

## 2016-12-20 VITALS — BP 102/68 | HR 77 | Wt 204.2 lb

## 2016-12-20 DIAGNOSIS — E109 Type 1 diabetes mellitus without complications: Secondary | ICD-10-CM | POA: Diagnosis not present

## 2016-12-20 DIAGNOSIS — Z0001 Encounter for general adult medical examination with abnormal findings: Secondary | ICD-10-CM | POA: Diagnosis not present

## 2016-12-20 DIAGNOSIS — H9209 Otalgia, unspecified ear: Secondary | ICD-10-CM

## 2016-12-20 DIAGNOSIS — E1010 Type 1 diabetes mellitus with ketoacidosis without coma: Secondary | ICD-10-CM

## 2016-12-20 DIAGNOSIS — Z0000 Encounter for general adult medical examination without abnormal findings: Secondary | ICD-10-CM

## 2016-12-20 MED ORDER — ALENDRONATE SODIUM 70 MG PO TABS: ORAL_TABLET | ORAL | 6 refills | Status: DC

## 2016-12-20 MED ORDER — CEFUROXIME AXETIL 250 MG PO TABS: 250.0000 mg | ORAL_TABLET | Freq: Two times a day (BID) | ORAL | 0 refills | Status: AC

## 2016-12-20 MED ORDER — INSULIN REGULAR HUMAN 100 UNIT/ML IJ SOLN: INTRAMUSCULAR | 3 refills | Status: DC

## 2016-12-20 NOTE — Progress Notes (Signed)
Subjective:    Patient ID: Molly [name], female    DOB: October 16, 1970, 46 y.o.   MRN: [id number]  HPI cpx Past Medical History:  Diagnosis Date  . ALLERGIC RHINITIS CAUSE UNSPECIFIED 03/06/2007  . Bipolar 1 disorder (HCC)   . CONTUSION, ANKLE 08/28/2007  . DEPRESSION 08/03/2006  . DIABETES MELLITUS, TYPE I 08/03/2006  . DM retinopathy (HCC)   . Encounter for long-term (current) use of other medications   . Hyperlipidemia   . MIGRAINE, CHRONIC 03/06/2007  . Osteoporosis   . SEBACEOUS CYST, INFECTED 07/13/2007  . SMOKER 08/11/2009  . THROMBOCYTOPENIA 05/07/2008  . TINEA VERSICOLOR 07/13/2007    Past Surgical History:  Procedure Laterality Date  . Abnormal U/S  12/10/1996  . ELECTROCARDIOGRAM  06/13/2006  . TUBAL LIGATION      Social History   Socioeconomic History  . Marital status: Widowed    Spouse name: Not on file  . Number of children: Not on file  . Years of education: Not on file  . Highest education level: Not on file  Social Needs  . Financial resource strain: Not on file  . Food insecurity - worry: Not on file  . Food insecurity - inability: Not on file  . Transportation needs - medical: Not on file  . Transportation needs - non-medical: Not on file  Occupational History  . Not on file  Tobacco Use  . Smoking status: Former Smoker    Last attempt to quit: 10/16/2010    Years since quitting: 6.1  . Smokeless tobacco: Never Used  Substance and Sexual Activity  . Alcohol use: No  . Drug use: No  . Sexual activity: No    Comment: tubal ligation  Other Topics Concern  . Not on file  Social History Narrative   Does not work outside the home    Current Outpatient Medications on File Prior to Visit  Medication Sig Dispense Refill  . Alcohol Swabs (ALCOHOL PREP) 70 % PADS 8/day  250.01 800 each 3  . alendronate (FOSAMAX) 70 MG tablet TAKE 1 TABLET BY MOUTH EVERY 7 DAYS. TAKE WITH FULL GLASS OF WATER ON AN EMPTY STOMACH 24 tablet 2  . Calcium Carbonate  (CALCIUM 600 PO) Take 1 tablet by mouth 2 (two) times daily.     . carbamazepine (TEGRETOL) 200 MG tablet Take 400-800 mg by mouth 2 (two) times daily. 2 tablets in the am and 4 tablets in the pm    . cetirizine-pseudoephedrine (ZYRTEC-D) 5-120 MG tablet Take 1 tablet by mouth daily.    Marland Kitchen. FLOVENT DISKUS 50 MCG/BLIST diskus inhaler INHALE 1 PUFF INTO THE LUNGS 2 (TWO) TIMES DAILY. 1 Inhaler 11  . glucose blood (FREESTYLE LITE) test strip 8/day dx 250.01, and lancets 800 each 3  . Insulin Infusion Pump Supplies (MINIMED INFUSION SET-MMT 398) MISC Inject 1 Device into the skin 3 days. Change every 3 days 30 each 3  . Insulin Infusion Pump Supplies (PARADIGM RESERVOIR 3ML) MISC Inject 1 Device into the skin 3 days. Change every 3 days 30 each 3  . lamoTRIgine (LAMICTAL) 200 MG tablet Take 200 mg by mouth 2 (two) times daily.     . Lancets (FREESTYLE) lancets Use as instructed dx 250.01 800 each 3  . Multiple Vitamin (MULTIVITAMIN) tablet Take 1 tablet by mouth daily.    . ondansetron (ZOFRAN) 4 MG tablet Take 1 tablet (4 mg total) by mouth every 8 (eight) hours as needed for nausea or vomiting. 100 tablet 3  .  rizatriptan (MAXALT) 10 MG tablet Take 1 tablet (10 mg total) by mouth as needed for migraine. May repeat in 2 hours if needed 100 tablet 3  . rosuvastatin (CRESTOR) 40 MG tablet TAKE 1 BY MOUTH DAILY AT BEDTIME 90 tablet 3  . terbinafine (LAMISIL) 250 MG tablet Take 1 tablet (250 mg total) by mouth daily. 90 tablet 0  . Transparent Dressings (OPSITE IV 3000) MISC Inject 1 Device into the skin 3 days. 30 each 3  . triamcinolone ointment (KENALOG) 0.5 % Apply 1 application topically 2 (two) times daily. 30 g 0  . VIT C-CHOLECALCIFEROL-ROSE HIP PO Take 1 tablet by mouth daily.     . ziprasidone (GEODON) 60 MG capsule Take 120 mg by mouth at bedtime. Take 2 by mouth at bedtime    . zolpidem (AMBIEN) 10 MG tablet Take 10 mg by mouth at bedtime.  1   No current facility-administered medications on  file prior to visit.     Allergies  Allergen Reactions  . Varenicline Tartrate     REACTION: rash    Family History  Problem Relation Age of Onset  . Diabetes Father        (Oral Agents)  . Diabetes Paternal Grandmother   . Hypertension Paternal Grandmother   . Diabetes Paternal Grandfather   . Cancer Paternal Grandfather        colon  . Hypertension Paternal Grandfather     BP 102/68 (BP Location: Left Arm, Patient Position: Sitting, Cuff Size: Normal)   Pulse 77   Wt 204 lb 3.2 oz (92.6 kg)   SpO2 98%   BMI 31.98 kg/m    Review of Systems Denies fatigue, visual loss, hearing loss, chest pain, back pain, cold intolerance, BRBPR, hematuria, syncope, numbness, allergy sxs, easy bruising, and rash.  MDI helps sob.  Depression is well-controlled.      Objective:   Physical Exam VS: see vs page GEN: no distress HEAD: head: no deformity eyes: no periorbital swelling, no proptosis external nose and ears are normal mouth: no lesion seen NECK: supple, thyroid is not enlarged CHEST WALL: no deformity LUNGS:  Clear to auscultation BREASTS: sees gyn.   CV: reg rate and rhythm, no murmur ABD: abdomen is soft, nontender.  no hepatosplenomegaly.  not distended.  no hernia GENITALIA/RECTAL: sees gyn MUSCULOSKELETAL: muscle bulk and strength are grossly normal.  no obvious joint swelling.  gait is normal and steady EXTEMITIES: no deformity.  no ulcer on the feet.  feet are of normal color and temp.  no edema PULSES: dorsalis pedis intact bilat.  no carotid bruit NEURO:  cn 2-12 grossly intact.   readily moves all 4's.  sensation is intact to touch on the feet SKIN:  Normal texture and temperature.  No rash or suspicious lesion is visible.   NODES:  None palpable at the neck PSYCH: alert, well-oriented.  Does not appear anxious nor depressed.   I personally reviewed electrocardiogram tracing (today): Indication: wellness Impression: NSR.  No MI.  No hypertrophy.   PRWP Compared to 2017: little if any change     Assessment & Plan:  Pt is here for regular wellness examination, and is feeling pretty well in general, and says chronic med probs are stable, except as noted below   SEPARATE EVALUATION FOLLOWS--EACH PROBLEM HERE IS NEW, NOT RESPONDING TO TREATMENT, OR POSES SIGNIFICANT RISK TO THE PATIENT'S HEALTH: HISTORY OF THE PRESENT ILLNESS: Pt returns for f/u of diabetes mellitus: DM type: 1 Dx'ed: 1983 Complications:  none Therapy: insulin since dx DKA: last episode was in early 2016--pt says was due to a bad bottle of insulin.   Severe hypoglycemia: last episode was many years ago.  Pancreatitis: never.  Other: she has a paradigm insulin pump; she declines continuous glucose monitor, due to cost Interval history:  She takes these pump settings:  basal rate of 0.8 units/hr, 24 hrs per day. She suspends for 1-2 hrs, if active. mealtime bolus of 1 unit/ 13 grams carbohydrate. however, she subtracts 3 units from calculated lunch bolus. Adds 11 units to calculated breakfast bolus.  correction bolus (which some people call "sensitivity," or "insulin sensitivity ratio," or just "isr") of 1 unit for each 40 by which cbg exceeds 100.  She says cbg is highest after breakfast (300).  Nocturnal hypoglycemia is resolved.  Pt reports 1 month of left ear pain, but no assoc fever PAST MEDICAL HISTORY Past Medical History:  Diagnosis Date  . ALLERGIC RHINITIS CAUSE UNSPECIFIED 03/06/2007  . Bipolar 1 disorder (HCC)   . CONTUSION, ANKLE 08/28/2007  . DEPRESSION 08/03/2006  . DIABETES MELLITUS, TYPE I 08/03/2006  . DM retinopathy (HCC)   . Encounter for long-term (current) use of other medications   . Hyperlipidemia   . MIGRAINE, CHRONIC 03/06/2007  . Osteoporosis   . SEBACEOUS CYST, INFECTED 07/13/2007  . SMOKER 08/11/2009  . THROMBOCYTOPENIA 05/07/2008  . TINEA VERSICOLOR 07/13/2007    Past Surgical History:  Procedure Laterality Date  . Abnormal U/S   12/10/1996  . ELECTROCARDIOGRAM  06/13/2006  . TUBAL LIGATION      Social History   Socioeconomic History  . Marital status: Widowed    Spouse name: Not on file  . Number of children: Not on file  . Years of education: Not on file  . Highest education level: Not on file  Social Needs  . Financial resource strain: Not on file  . Food insecurity - worry: Not on file  . Food insecurity - inability: Not on file  . Transportation needs - medical: Not on file  . Transportation needs - non-medical: Not on file  Occupational History  . Not on file  Tobacco Use  . Smoking status: Former Smoker    Last attempt to quit: 10/16/2010    Years since quitting: 6.1  . Smokeless tobacco: Never Used  Substance and Sexual Activity  . Alcohol use: No  . Drug use: No  . Sexual activity: No    Comment: tubal ligation  Other Topics Concern  . Not on file  Social History Narrative   Does not work outside the home    Current Outpatient Medications on File Prior to Visit  Medication Sig Dispense Refill  . Alcohol Swabs (ALCOHOL PREP) 70 % PADS 8/day  250.01 800 each 3  . alendronate (FOSAMAX) 70 MG tablet TAKE 1 TABLET BY MOUTH EVERY 7 DAYS. TAKE WITH FULL GLASS OF WATER ON AN EMPTY STOMACH 24 tablet 2  . Calcium Carbonate (CALCIUM 600 PO) Take 1 tablet by mouth 2 (two) times daily.     . carbamazepine (TEGRETOL) 200 MG tablet Take 400-800 mg by mouth 2 (two) times daily. 2 tablets in the am and 4 tablets in the pm    . cetirizine-pseudoephedrine (ZYRTEC-D) 5-120 MG tablet Take 1 tablet by mouth daily.    Marland Kitchen. FLOVENT DISKUS 50 MCG/BLIST diskus inhaler INHALE 1 PUFF INTO THE LUNGS 2 (TWO) TIMES DAILY. 1 Inhaler 11  . glucose blood (FREESTYLE LITE) test strip 8/day dx 250.01,  and lancets 800 each 3  . Insulin Infusion Pump Supplies (MINIMED INFUSION SET-MMT 398) MISC Inject 1 Device into the skin 3 days. Change every 3 days 30 each 3  . Insulin Infusion Pump Supplies (PARADIGM RESERVOIR ) MISC  Inject 1 Device into the skin 3 days. Change every 3 days 30 each 3  . lamoTRIgine (LAMICTAL) 200 MG tablet Take 200 mg by mouth 2 (two) times daily.     . Lancets (FREESTYLE) lancets Use as instructed dx 250.01 800 each 3  . Multiple Vitamin (MULTIVITAMIN) tablet Take 1 tablet by mouth daily.    . ondansetron (ZOFRAN) 4 MG tablet Take 1 tablet (4 mg total) by mouth every 8 (eight) hours as needed for nausea or vomiting. 100 tablet 3  . rizatriptan (MAXALT) 10 MG tablet Take 1 tablet (10 mg total) by mouth as needed for migraine. May repeat in 2 hours if needed 100 tablet 3  . rosuvastatin (CRESTOR) 40 MG tablet TAKE 1 BY MOUTH DAILY AT BEDTIME 90 tablet 3  . terbinafine (LAMISIL) 250 MG tablet Take 1 tablet (250 mg total) by mouth daily. 90 tablet 0  . Transparent Dressings (OPSITE IV 3000) MISC Inject 1 Device into the skin 3 days. 30 each 3  . triamcinolone ointment (KENALOG) 0.5 % Apply 1 application topically 2 (two) times daily. 30 g 0  . VIT C-CHOLECALCIFEROL-ROSE HIP PO Take 1 tablet by mouth daily.     . ziprasidone (GEODON) 60 MG capsule Take 120 mg by mouth at bedtime. Take 2 by mouth at bedtime    . zolpidem (AMBIEN) 10 MG tablet Take 10 mg by mouth at bedtime.  1   No current facility-administered medications on file prior to visit.     Allergies  Allergen Reactions  . Varenicline Tartrate     REACTION: rash    Family History  Problem Relation Age of Onset  . Diabetes Father        (Oral Agents)  . Diabetes Paternal Grandmother   . Hypertension Paternal Grandmother   . Diabetes Paternal Grandfather   . Cancer Paternal Grandfather        colon  . Hypertension Paternal Grandfather     BP 102/68 (BP Location: Left Arm, Patient Position: Sitting, Cuff Size: Normal)   Pulse 77   Wt 204 lb 3.2 oz (92.6 kg)   SpO2 98%   BMI 31.98 kg/m   REVIEW OF SYSTEMS: No change in chronic rhinorrhea PHYSICAL EXAMINATION: VITAL SIGNS:  See vs page GENERAL: no distress Ears:  both TM's are slightly red Pulses: foot pulses are intact bilaterally.   MSK: no deformity of the feet or ankles.  CV: no edema of the legs or ankles Skin:  no ulcer on the feet or ankles.  normal color and temp on the feet and ankles Neuro: sensation is intact to touch on the feet and ankles.   IMPRESSION: Type 1 DM: Based on the pattern of her cbg's, she needs some adjustment in her therapy Ear pain, new PLAN:  I have sent a prescription to your pharmacy, for an antibiotic, on a trial basis.   Please take these settings:  basal rate of 0.8 units/hr, 24 hrs per day. Suspend it for 1-2 hrs, if you are active.  mealtime bolus of 1 unit/ 13 grams carbohydrate. however, add 14 units to calculated breakfast bolus, and subtract 3 units from calculated lunch bolus.  correction bolus (which some people call "sensitivity," or "insulin sensitivity ratio," or just "  isr") of 1 unit for each 40 by which your blood sugar exceeds 100.

## 2016-12-20 NOTE — Patient Instructions (Addendum)
Please take these settings:  basal rate of 0.8 units/hr, 24 hrs per day. Suspend it for 1-2 hrs, if you are active.  mealtime bolus of 1 unit/ 13 grams carbohydrate. however, add 14 units to calculated breakfast bolus, and subtract 3 units from calculated lunch bolus.  correction bolus (which some people call "sensitivity," or "insulin sensitivity ratio," or just "isr") of 1 unit for each 40 by which your blood sugar exceeds 100.   Please consider these measures for your health:  minimize alcohol.  Do not use tobacco products.  Have a colonoscopy at least every 10 years from age 47.  Women should have an annual mammogram from age 47.  Keep firearms safely stored.  Always use seat belts.  have working smoke alarms in your home.  See an eye doctor and dentist regularly.  Never drive under the influence of alcohol or drugs (including prescription drugs).  Those with fair skin should take precautions against the sun, and should carefully examine their skin once per month, for any new or changed moles. I have sent a prescription to your pharmacy, for an antibiotic pill, on a trial basis.   Please come back for a follow-up appointment in 3 months.

## 2017-01-20 ENCOUNTER — Other Ambulatory Visit: Payer: Self-pay | Admitting: Endocrinology

## 2017-03-03 ENCOUNTER — Telehealth: Payer: Self-pay | Admitting: Endocrinology

## 2017-03-03 NOTE — Telephone Encounter (Signed)
Please advise on below  

## 2017-03-03 NOTE — Telephone Encounter (Signed)
Pt states that she has a sinus infection and will be coming in on Tuesday at 8;15, She would like to know if there is anything he prescribe till that appt.    Please advise

## 2017-03-03 NOTE — Telephone Encounter (Signed)
Options: Urgent care today, or [facility] tomorrow

## 2017-03-03 NOTE — Telephone Encounter (Signed)
I contacted the patient and advised of MD's instructions. She voiced understanding and will seek care at [facility].

## 2017-03-07 ENCOUNTER — Ambulatory Visit: Payer: BLUE CROSS/BLUE SHIELD | Admitting: Endocrinology

## 2017-03-22 ENCOUNTER — Encounter: Payer: Self-pay | Admitting: Endocrinology

## 2017-03-22 ENCOUNTER — Ambulatory Visit: Payer: BLUE CROSS/BLUE SHIELD | Admitting: Endocrinology

## 2017-03-22 VITALS — BP 140/70 | HR 95 | Wt 202.4 lb

## 2017-03-22 DIAGNOSIS — E1010 Type 1 diabetes mellitus with ketoacidosis without coma: Secondary | ICD-10-CM | POA: Diagnosis not present

## 2017-03-22 LAB — POCT GLYCOSYLATED HEMOGLOBIN (HGB A1C): HEMOGLOBIN A1C: 7.8

## 2017-03-22 MED ORDER — CETIRIZINE-PSEUDOEPHEDRINE ER 5-120 MG PO TB12: 1.0000 | ORAL_TABLET | Freq: Every day | ORAL | 3 refills | Status: DC

## 2017-03-22 NOTE — Progress Notes (Signed)
Subjective:    Patient ID: Molly [name], female    DOB: Jul 30, 1970, 47 y.o.   MRN: [id number]  HPI Pt returns for f/u of diabetes mellitus: DM type: 1 Dx'ed: 1983 Complications: none Therapy: insulin since dx DKA: last episode was in early 2016--pt says was due to a bad bottle of insulin.   Severe hypoglycemia: last episode was many years ago.  Pancreatitis: never.  Other: she has a paradigm insulin pump; she declines continuous glucose monitor, due to cost Interval history:  She takes these pump settings:  basal rate of 0.8 units/hr, 24 hrs per day. Suspend it for 1-2 hrs, if you are active.  mealtime bolus of 1 unit/ 13 grams carbohydrate. however, adds 13 units to calculated breakfast bolus, and subtract 3 units from calculated lunch bolus.  correction bolus (which some people call "sensitivity," or "insulin sensitivity ratio," or just "isr") of 1 unit for each 40 by which your blood sugar exceeds 100.  Total daily dosage is approx 50 units.  no cbg record, but states cbg's are seldom low.  It is highest after breakfast.   BP was 102/ last time Past Medical History:  Diagnosis Date  . ALLERGIC RHINITIS CAUSE UNSPECIFIED 03/06/2007  . Bipolar 1 disorder (HCC)   . CONTUSION, ANKLE 08/28/2007  . DEPRESSION 08/03/2006  . DIABETES MELLITUS, TYPE I 08/03/2006  . DM retinopathy (HCC)   . Encounter for long-term (current) use of other medications   . Hyperlipidemia   . MIGRAINE, CHRONIC 03/06/2007  . Osteoporosis   . SEBACEOUS CYST, INFECTED 07/13/2007  . SMOKER 08/11/2009  . THROMBOCYTOPENIA 05/07/2008  . TINEA VERSICOLOR 07/13/2007    Past Surgical History:  Procedure Laterality Date  . Abnormal U/S  12/10/1996  . ELECTROCARDIOGRAM  06/13/2006  . TUBAL LIGATION      Social History   Socioeconomic History  . Marital status: Widowed    Spouse name: Not on file  . Number of children: Not on file  . Years of education: Not on file  . Highest education level: Not on  file  Social Needs  . Financial resource strain: Not on file  . Food insecurity - worry: Not on file  . Food insecurity - inability: Not on file  . Transportation needs - medical: Not on file  . Transportation needs - non-medical: Not on file  Occupational History  . Not on file  Tobacco Use  . Smoking status: Former Smoker    Last attempt to quit: 10/16/2010    Years since quitting: 6.4  . Smokeless tobacco: Never Used  Substance and Sexual Activity  . Alcohol use: No  . Drug use: No  . Sexual activity: No    Comment: tubal ligation  Other Topics Concern  . Not on file  Social History Narrative   Does not work outside the home    Current Outpatient Medications on File Prior to Visit  Medication Sig Dispense Refill  . Alcohol Swabs (ALCOHOL PREP) 70 % PADS 8/day  250.01 800 each 3  . alendronate (FOSAMAX) 70 MG tablet TAKE 1 TABLET BY MOUTH EVERY 7 DAYS. TAKE WITH FULL GLASS OF WATER ON AN EMPTY STOMACH 24 tablet 2  . Calcium Carbonate (CALCIUM 600 PO) Take 1 tablet by mouth 2 (two) times daily.     . carbamazepine (TEGRETOL) 200 MG tablet Take 400-800 mg by mouth 2 (two) times daily. 2 tablets in the am and 4 tablets in the pm    . FLOVENT DISKUS  50 MCG/BLIST diskus inhaler INHALE 1 PUFF INTO THE LUNGS 2 (TWO) TIMES DAILY. 1 Inhaler 11  . glucose blood (FREESTYLE LITE) test strip 8/day dx 250.01, and lancets 800 each 3  . Insulin Infusion Pump Supplies (MINIMED INFUSION SET-MMT 398) MISC Inject 1 Device into the skin 3 days. Change every 3 days 30 each 3  . Insulin Infusion Pump Supplies (PARADIGM RESERVOIR 3ML) MISC Inject 1 Device into the skin 3 days. Change every 3 days 30 each 3  . insulin regular (NOVOLIN R) 100 units/mL injection INJECT A TOTAL OF 100 UNITS EVERY DAY VIA INSULIN PUMP. 100 mL 0  . lamoTRIgine (LAMICTAL) 200 MG tablet Take 200 mg by mouth 2 (two) times daily.     . Lancets (FREESTYLE) lancets Use as instructed dx 250.01 800 each 3  . Multiple Vitamin  (MULTIVITAMIN) tablet Take 1 tablet by mouth daily.    . ondansetron (ZOFRAN) 4 MG tablet Take 1 tablet (4 mg total) by mouth every 8 (eight) hours as needed for nausea or vomiting. 100 tablet 3  . rizatriptan (MAXALT) 10 MG tablet Take 1 tablet (10 mg total) by mouth as needed for migraine. May repeat in 2 hours if needed 100 tablet 3  . rosuvastatin (CRESTOR) 40 MG tablet TAKE 1 BY MOUTH DAILY AT BEDTIME 90 tablet 3  . terbinafine (LAMISIL) 250 MG tablet Take 1 tablet (250 mg total) by mouth daily. 90 tablet 0  . Transparent Dressings (OPSITE IV 3000) MISC Inject 1 Device into the skin 3 days. 30 each 3  . VIT C-CHOLECALCIFEROL-ROSE HIP PO Take 1 tablet by mouth daily.     . ziprasidone (GEODON) 60 MG capsule Take 120 mg by mouth at bedtime. Take 2 by mouth at bedtime    . zolpidem (AMBIEN) 10 MG tablet Take 10 mg by mouth at bedtime.  1   No current facility-administered medications on file prior to visit.     Allergies  Allergen Reactions  . Varenicline Tartrate     REACTION: rash    Family History  Problem Relation Age of Onset  . Diabetes Father        (Oral Agents)  . Diabetes Paternal Grandmother   . Hypertension Paternal Grandmother   . Diabetes Paternal Grandfather   . Cancer Paternal Grandfather        colon  . Hypertension Paternal Grandfather     BP 140/70 (BP Location: Left Arm, Patient Position: Sitting, Cuff Size: Large)   Pulse 95   Wt 202 lb 6.4 oz (91.8 kg)   SpO2 98%   BMI 31.70 kg/m   Review of Systems Denies LOC. Allergy sxs are worse recently    Objective:   Physical Exam VITAL SIGNS:  See vs page GENERAL: no distress Pulses: dorsalis pedis intact bilat.   MSK: no deformity of the feet CV: no leg edema Skin:  no ulcer on the feet.  normal color and temp on the feet. Neuro: sensation is intact to touch on the feet    Lab Results  Component Value Date   HGBA1C 7.4 11/09/2016       Assessment & Plan:  Type 1 DM: this is the best control  this pt should aim for, given variable cbg's HTN: poss situational component. Same rx for now Allergic rhinitis, worse   Patient Instructions  Please take these settings:  basal rate of 0.8 units/hr, 24 hrs per day. Suspend it for 1-2 hrs, if you are active.  mealtime bolus of  1 unit/ 16 grams carbohydrate. however, add 14 units to calculated breakfast bolus, and subtract 3 units from calculated lunch bolus.  correction bolus (which some people call "sensitivity," or "insulin sensitivity ratio," or just "isr") of 1 unit for each 40 by which your blood sugar exceeds 100.   We'll recheck the blood pressure next time. I have sent a prescription to your pharmacy, for Zyrtec-D. Please come back for a follow-up appointment in 3-4 months.

## 2017-03-22 NOTE — Patient Instructions (Addendum)
Please take these settings:  basal rate of 0.8 units/hr, 24 hrs per day. Suspend it for 1-2 hrs, if you are active.  mealtime bolus of 1 unit/ 16 grams carbohydrate. however, add 14 units to calculated breakfast bolus, and subtract 3 units from calculated lunch bolus.  correction bolus (which some people call "sensitivity," or "insulin sensitivity ratio," or just "isr") of 1 unit for each 40 by which your blood sugar exceeds 100.   We'll recheck the blood pressure next time. I have sent a prescription to your pharmacy, for Zyrtec-D. Please come back for a follow-up appointment in 3-4 months.

## 2017-04-20 ENCOUNTER — Ambulatory Visit: Payer: BLUE CROSS/BLUE SHIELD | Admitting: Endocrinology

## 2017-04-20 ENCOUNTER — Encounter: Payer: Self-pay | Admitting: Endocrinology

## 2017-04-20 DIAGNOSIS — J329 Chronic sinusitis, unspecified: Secondary | ICD-10-CM | POA: Insufficient documentation

## 2017-04-20 DIAGNOSIS — J019 Acute sinusitis, unspecified: Secondary | ICD-10-CM

## 2017-04-20 MED ORDER — DOXYCYCLINE HYCLATE 100 MG PO TABS
100.0000 mg | ORAL_TABLET | Freq: Two times a day (BID) | ORAL | 0 refills | Status: DC
Start: 2017-04-20 — End: 2017-07-20

## 2017-04-20 NOTE — Progress Notes (Signed)
Subjective:    Patient ID: Molly [name], female    DOB: [birth year], 47 y.o.   MRN: [id number]  HPI Pt states few days of moderate congestion in the nose, but no assoc fever.  These sxs are despite zyrtec-D and flovent Past Medical History:  Diagnosis Date  . ALLERGIC RHINITIS CAUSE UNSPECIFIED 03/06/2007  . Bipolar 1 disorder (HCC)   . CONTUSION, ANKLE 08/28/2007  . DEPRESSION 08/03/2006  . DIABETES MELLITUS, TYPE I 08/03/2006  . DM retinopathy (HCC)   . Encounter for long-term (current) use of other medications   . Hyperlipidemia   . MIGRAINE, CHRONIC 03/06/2007  . Osteoporosis   . SEBACEOUS CYST, INFECTED 07/13/2007  . SMOKER 08/11/2009  . THROMBOCYTOPENIA 05/07/2008  . TINEA VERSICOLOR 07/13/2007    Past Surgical History:  Procedure Laterality Date  . Abnormal U/S  12/10/1996  . ELECTROCARDIOGRAM  06/13/2006  . TUBAL LIGATION      Social History   Socioeconomic History  . Marital status: Widowed    Spouse name: Not on file  . Number of children: Not on file  . Years of education: Not on file  . Highest education level: Not on file  Occupational History  . Not on file  Social Needs  . Financial resource strain: Not on file  . Food insecurity:    Worry: Not on file    Inability: Not on file  . Transportation needs:    Medical: Not on file    Non-medical: Not on file  Tobacco Use  . Smoking status: Former Smoker    Last attempt to quit: 10/16/2010    Years since quitting: 6.5  . Smokeless tobacco: Never Used  Substance and Sexual Activity  . Alcohol use: No  . Drug use: No  . Sexual activity: Never    Comment: tubal ligation  Lifestyle  . Physical activity:    Days per week: Not on file    Minutes per session: Not on file  . Stress: Not on file  Relationships  . Social connections:    Talks on phone: Not on file    Gets together: Not on file    Attends religious service: Not on file    Active member of club or organization: Not on file    Attends  meetings of clubs or organizations: Not on file    Relationship status: Not on file  . Intimate partner violence:    Fear of current or ex partner: Not on file    Emotionally abused: Not on file    Physically abused: Not on file    Forced sexual activity: Not on file  Other Topics Concern  . Not on file  Social History Narrative   Does not work outside the home    Current Outpatient Medications on File Prior to Visit  Medication Sig Dispense Refill  . Alcohol Swabs (ALCOHOL PREP) 70 % PADS 8/day  250.01 800 each 3  . alendronate (FOSAMAX) 70 MG tablet TAKE 1 TABLET BY MOUTH EVERY 7 DAYS. TAKE WITH FULL GLASS OF WATER ON AN EMPTY STOMACH 24 tablet 2  . Calcium Carbonate (CALCIUM 600 PO) Take 1 tablet by mouth 2 (two) times daily.     . carbamazepine (TEGRETOL) 200 MG tablet Take 400-800 mg by mouth 2 (two) times daily. 2 tablets in the am and 4 tablets in the pm    . cetirizine-pseudoephedrine (ZYRTEC-D) 5-120 MG tablet Take 1 tablet by mouth daily. 90 tablet 3  . FLOVENT DISKUS  50 MCG/BLIST diskus inhaler INHALE 1 PUFF INTO THE LUNGS 2 (TWO) TIMES DAILY. 1 Inhaler 11  . glucose blood (FREESTYLE LITE) test strip 8/day dx 250.01, and lancets 800 each 3  . Insulin Infusion Pump Supplies (MINIMED INFUSION SET-MMT 398) MISC Inject 1 Device into the skin 3 days. Change every 3 days 30 each 3  . Insulin Infusion Pump Supplies (PARADIGM RESERVOIR ) MISC Inject 1 Device into the skin 3 days. Change every 3 days 30 each 3  . insulin regular (NOVOLIN R) 100 units/mL injection INJECT A TOTAL OF 100 UNITS EVERY DAY VIA INSULIN PUMP. 100 mL 0  . lamoTRIgine (LAMICTAL) 200 MG tablet Take 200 mg by mouth 2 (two) times daily.     . Lancets (FREESTYLE) lancets Use as instructed dx 250.01 800 each 3  . Multiple Vitamin (MULTIVITAMIN) tablet Take 1 tablet by mouth daily.    . ondansetron (ZOFRAN) 4 MG tablet Take 1 tablet (4 mg total) by mouth every 8 (eight) hours as needed for nausea or vomiting. 100  tablet 3  . rizatriptan (MAXALT) 10 MG tablet Take 1 tablet (10 mg total) by mouth as needed for migraine. May repeat in 2 hours if needed 100 tablet 3  . rosuvastatin (CRESTOR) 40 MG tablet TAKE 1 BY MOUTH DAILY AT BEDTIME 90 tablet 3  . Transparent Dressings (OPSITE IV 3000) MISC Inject 1 Device into the skin 3 days. 30 each 3  . VIT C-CHOLECALCIFEROL-ROSE HIP PO Take 1 tablet by mouth daily.     . ziprasidone (GEODON) 60 MG capsule Take 120 mg by mouth at bedtime. Take 2 by mouth at bedtime    . zolpidem (AMBIEN) 10 MG tablet Take 10 mg by mouth at bedtime.  1   No current facility-administered medications on file prior to visit.     Allergies  Allergen Reactions  . Varenicline Tartrate     REACTION: rash    Family History  Problem Relation Age of Onset  . Diabetes Father        (Oral Agents)  . Diabetes Paternal Grandmother   . Hypertension Paternal Grandmother   . Diabetes Paternal Grandfather   . Cancer Paternal Grandfather        colon  . Hypertension Paternal Grandfather     BP 128/62 (BP Location: Left Arm, Patient Position: Sitting, Cuff Size: Normal)   Pulse 74   Wt 201 lb 3.2 oz (91.3 kg)   SpO2 97%   BMI 31.51 kg/m    Review of Systems Denies earache, but she has sore throat.  Little if any cough.      Objective:   Physical Exam VITAL SIGNS:  See vs page GENERAL: no distress head: no deformity  eyes: no periorbital swelling, no proptosis  external nose and ears are normal  mouth: no lesion seen.   Left TM is slightly red      Assessment & Plan:  Sinusitis, new  Patient Instructions  I have sent a prescription to your pharmacy, for an antibiotic pill Please continue the same Zyrtec-D. I'll see you next time.

## 2017-04-20 NOTE — Patient Instructions (Addendum)
I have sent a prescription to your pharmacy, for an antibiotic pill Please continue the same Zyrtec-D. I'll see you next time.

## 2017-05-08 ENCOUNTER — Other Ambulatory Visit: Payer: Self-pay | Admitting: Endocrinology

## 2017-05-13 ENCOUNTER — Other Ambulatory Visit: Payer: Self-pay | Admitting: Endocrinology

## 2017-06-13 ENCOUNTER — Ambulatory Visit: Payer: BLUE CROSS/BLUE SHIELD | Admitting: Endocrinology

## 2017-06-13 DIAGNOSIS — Z0289 Encounter for other administrative examinations: Secondary | ICD-10-CM

## 2017-07-20 ENCOUNTER — Encounter: Payer: Self-pay | Admitting: Endocrinology

## 2017-07-20 ENCOUNTER — Ambulatory Visit: Payer: BLUE CROSS/BLUE SHIELD | Admitting: Endocrinology

## 2017-07-20 VITALS — BP 132/66 | HR 80 | Wt 205.6 lb

## 2017-07-20 DIAGNOSIS — E1010 Type 1 diabetes mellitus with ketoacidosis without coma: Secondary | ICD-10-CM

## 2017-07-20 LAB — POCT GLYCOSYLATED HEMOGLOBIN (HGB A1C): HEMOGLOBIN A1C: 7.6 % — AB (ref 4.0–5.6)

## 2017-07-20 MED ORDER — CYCLOBENZAPRINE HCL 10 MG PO TABS: 10.0000 mg | ORAL_TABLET | Freq: Three times a day (TID) | ORAL | 0 refills | Status: DC | PRN

## 2017-07-20 NOTE — Patient Instructions (Addendum)
Please take these settings:  basal rate of 0.8 units/hr, 24 hrs per day. Suspend it for 1-2 hrs, if you are active.  mealtime bolus of 1 unit/ 16 grams carbohydrate. however, add 18 units to calculated breakfast bolus, and subtract 3 units from calculated lunch bolus.  correction bolus (which some people call "sensitivity," or "insulin sensitivity ratio," or just "isr") of 1 unit for each 40 by which your blood sugar exceeds 100.   We'll recheck the blood pressure next time. Please come back for a follow-up appointment in 3-4 months.

## 2017-07-20 NOTE — Progress Notes (Signed)
Subjective:    Patient ID: Molly [name], female    DOB: Nov 02, 1970, 47 y.o.   MRN: [id number]  HPI Pt returns for f/u of diabetes mellitus: DM type: 1 Dx'ed: 1983 Complications: none Therapy: insulin since dx DKA: last episode was in early 2016--pt says was due to a bad bottle of insulin.   Severe hypoglycemia: last episode was many years ago.  Pancreatitis: never.  Other: she has a paradigm insulin pump; she declines continuous glucose monitor, due to cost.   Interval history:  She takes these pump settings:  basal rate of 0.8 units/hr, 24 hrs per day. Suspend it for 1-2 hrs, if you are active.  mealtime bolus of 1 unit/ 16 grams carbohydrate. however, add 14 units to calculated breakfast bolus, and subtract 3 units from calculated lunch bolus. correction bolus (which some people call "sensitivity," or "insulin sensitivity ratio," or just "isr") of 1 unit for each 40 by which your blood sugar exceeds 100.  Total daily dosage is approx 60 units.  no cbg record, but states cbg's are seldom low. This happens at HS.  It is highest after breakfast (200's).  pt states she feels well in general. Past Medical History:  Diagnosis Date  . ALLERGIC RHINITIS CAUSE UNSPECIFIED 03/06/2007  . Bipolar 1 disorder (HCC)   . CONTUSION, ANKLE 08/28/2007  . DEPRESSION 08/03/2006  . DIABETES MELLITUS, TYPE I 08/03/2006  . DM retinopathy (HCC)   . Encounter for long-term (current) use of other medications   . Hyperlipidemia   . MIGRAINE, CHRONIC 03/06/2007  . Osteoporosis   . SEBACEOUS CYST, INFECTED 07/13/2007  . SMOKER 08/11/2009  . THROMBOCYTOPENIA 05/07/2008  . TINEA VERSICOLOR 07/13/2007    Past Surgical History:  Procedure Laterality Date  . Abnormal U/S  12/10/1996  . ELECTROCARDIOGRAM  06/13/2006  . TUBAL LIGATION      Social History   Socioeconomic History  . Marital status: Widowed    Spouse name: Not on file  . Number of children: Not on file  . Years of education: Not on  file  . Highest education level: Not on file  Occupational History  . Not on file  Social Needs  . Financial resource strain: Not on file  . Food insecurity:    Worry: Not on file    Inability: Not on file  . Transportation needs:    Medical: Not on file    Non-medical: Not on file  Tobacco Use  . Smoking status: Former Smoker    Last attempt to quit: 10/16/2010    Years since quitting: 6.7  . Smokeless tobacco: Never Used  Substance and Sexual Activity  . Alcohol use: No  . Drug use: No  . Sexual activity: Never    Comment: tubal ligation  Lifestyle  . Physical activity:    Days per week: Not on file    Minutes per session: Not on file  . Stress: Not on file  Relationships  . Social connections:    Talks on phone: Not on file    Gets together: Not on file    Attends religious service: Not on file    Active member of club or organization: Not on file    Attends meetings of clubs or organizations: Not on file    Relationship status: Not on file  . Intimate partner violence:    Fear of current or ex partner: Not on file    Emotionally abused: Not on file    Physically abused: Not  on file    Forced sexual activity: Not on file  Other Topics Concern  . Not on file  Social History Narrative   Does not work outside the home    Current Outpatient Medications on File Prior to Visit  Medication Sig Dispense Refill  . Alcohol Swabs (ALCOHOL PREP) 70 % PADS 8/day  250.01 800 each 3  . alendronate (FOSAMAX) 70 MG tablet TAKE 1 TABLET BY MOUTH EVERY 7 DAYS. TAKE WITH FULL GLASS OF WATER ON AN EMPTY STOMACH 24 tablet 2  . Calcium Carbonate (CALCIUM 600 PO) Take 1 tablet by mouth 2 (two) times daily.     . carbamazepine (TEGRETOL) 200 MG tablet Take 400-800 mg by mouth 2 (two) times daily. 2 tablets in the am and 4 tablets in the pm    . cetirizine-pseudoephedrine (ZYRTEC-D) 5-120 MG tablet Take 1 tablet by mouth daily. 90 tablet 3  . FLOVENT DISKUS 50 MCG/BLIST diskus inhaler  INHALE 1 PUFF INTO THE LUNGS 2 (TWO) TIMES DAILY. 1 Inhaler 11  . glucose blood (FREESTYLE LITE) test strip 8/day dx 250.01, and lancets 800 each 3  . Insulin Infusion Pump Supplies (MINIMED INFUSION SET-MMT 398) MISC Inject 1 Device into the skin 3 days. Change every 3 days 30 each 3  . Insulin Infusion Pump Supplies (PARADIGM RESERVOIR ) MISC Inject 1 Device into the skin 3 days. Change every 3 days 30 each 3  . insulin regular (NOVOLIN R) 100 units/mL injection INJECT A TOTAL OF 100 UNITS EVERY DAY VIA INSULIN PUMP. 90 mL 0  . lamoTRIgine (LAMICTAL) 200 MG tablet Take 200 mg by mouth 2 (two) times daily.     . Lancets (FREESTYLE) lancets Use as instructed dx 250.01 800 each 3  . Multiple Vitamin (MULTIVITAMIN) tablet Take 1 tablet by mouth daily.    . ondansetron (ZOFRAN) 4 MG tablet Take 1 tablet (4 mg total) by mouth every 8 (eight) hours as needed for nausea or vomiting. 100 tablet 3  . rizatriptan (MAXALT) 10 MG tablet Take 1 tablet (10 mg total) by mouth as needed for migraine. May repeat in 2 hours if needed 100 tablet 3  . rosuvastatin (CRESTOR) 40 MG tablet TAKE 1 TABLET BY MOUTH DAILY AT BEDTIME 90 tablet 0  . VIT C-CHOLECALCIFEROL-ROSE HIP PO Take 1 tablet by mouth daily.     . ziprasidone (GEODON) 60 MG capsule Take 120 mg by mouth at bedtime. Take 2 by mouth at bedtime    . zolpidem (AMBIEN) 10 MG tablet Take 10 mg by mouth at bedtime.  1   No current facility-administered medications on file prior to visit.     Allergies  Allergen Reactions  . Varenicline Tartrate     REACTION: rash    Family History  Problem Relation Age of Onset  . Diabetes Father        (Oral Agents)  . Diabetes Paternal Grandmother   . Hypertension Paternal Grandmother   . Diabetes Paternal Grandfather   . Cancer Paternal Grandfather        colon  . Hypertension Paternal Grandfather     BP 132/66 (BP Location: Left Arm, Patient Position: Sitting, Cuff Size: Normal)   Pulse 80   Wt 205 lb  9.6 oz (93.3 kg)   SpO2 98%   BMI 32.20 kg/m    Review of Systems Denies LOC.     Objective:   Physical Exam VITAL SIGNS:  See vs page GENERAL: no distress Pulses: foot pulses are intact  bilaterally.   MSK: no deformity of the feet or ankles.  CV: no edema of the legs or ankles Skin:  no ulcer on the feet or ankles.  normal color and temp on the feet and ankles Neuro: sensation is intact to touch on the feet and ankles.    A1c=7.6%     Assessment & Plan:  Type 1 DM: Based on the pattern of her cbg's, she needs some adjustment in her therapy  Patient Instructions  Please take these settings:  basal rate of 0.8 units/hr, 24 hrs per day. Suspend it for 1-2 hrs, if you are active.  mealtime bolus of 1 unit/ 16 grams carbohydrate. however, add 18 units to calculated breakfast bolus, and subtract 3 units from calculated lunch bolus.  correction bolus (which some people call "sensitivity," or "insulin sensitivity ratio," or just "isr") of 1 unit for each 40 by which your blood sugar exceeds 100.   We'll recheck the blood pressure next time. Please come back for a follow-up appointment in 3-4 months.

## 2017-07-26 ENCOUNTER — Ambulatory Visit (INDEPENDENT_AMBULATORY_CARE_PROVIDER_SITE_OTHER): Payer: BLUE CROSS/BLUE SHIELD | Admitting: Obstetrics & Gynecology

## 2017-07-26 ENCOUNTER — Telehealth: Payer: Self-pay

## 2017-07-26 ENCOUNTER — Encounter: Payer: Self-pay | Admitting: Obstetrics & Gynecology

## 2017-07-26 VITALS — BP 160/82 | Ht 66.0 in | Wt 208.0 lb

## 2017-07-26 DIAGNOSIS — Z01419 Encounter for gynecological examination (general) (routine) without abnormal findings: Secondary | ICD-10-CM | POA: Diagnosis not present

## 2017-07-26 DIAGNOSIS — E1010 Type 1 diabetes mellitus with ketoacidosis without coma: Secondary | ICD-10-CM

## 2017-07-26 DIAGNOSIS — L292 Pruritus vulvae: Secondary | ICD-10-CM

## 2017-07-26 DIAGNOSIS — Z9851 Tubal ligation status: Secondary | ICD-10-CM

## 2017-07-26 LAB — WET PREP FOR TRICH, YEAST, CLUE

## 2017-07-26 MED ORDER — TRIAMCINOLONE ACETONIDE 0.1 % EX CREA: 1.0000 "application " | TOPICAL_CREAM | Freq: Every day | CUTANEOUS | 5 refills | Status: DC

## 2017-07-26 NOTE — Telephone Encounter (Signed)
Medication prescribed today was sent to wrong pharmacy. Called and cancelled it with pharmacy and redirected it to correct pharmacy. Cleaned up pharmacies in chart.

## 2017-07-26 NOTE — Progress Notes (Signed)
Molly [name] 1970/11/12 [id number]   History:    47 y.o. G4P2A2L2 Widowed x 6 yrs.  S/P TL.  Daughter 48 yo, son 48 yo.  RP:  Established patient presenting for annual gyn exam   HPI: Menses regular normal every month.  No breakthrough bleeding.  No pelvic pain.  Complains of occasional vulvar itching and discomfort which is well controlled with Kenalog cream applications as needed.  No abnormal vaginal discharge.  Not sexually active currently.  Urine and bowel movements normal.  Breasts normal.  Body mass index 33.57.  Health labs with family physician.  Past medical history,surgical history, family history and social history were all reviewed and documented in the EPIC chart.  Gynecologic History Patient's last menstrual period was 07/26/2016. Contraception: tubal ligation Last Pap: 07/2016. Results were: Negative/HPV HR neg Last mammogram: 11/2015. Results were: Negative Bone Density: 08/2016 Osteopenia, Left Femoral Neck T-Score -1.7 Colonoscopy: Never  Obstetric History OB History  Gravida Para Term Preterm AB Living  4 2 1 1 2 2   SAB TAB Ectopic Multiple Live Births  2   0   2    # Outcome Date GA Lbr Len/2nd Weight Sex Delivery Anes PTL Lv  4 SAB           3 SAB           2 Term     M   N LIV  1 Preterm     F CS-Unspec  Y LIV     ROS: A ROS was performed and pertinent positives and negatives are included in the history.  GENERAL: No fevers or chills. HEENT: No change in vision, no earache, sore throat or sinus congestion. NECK: No pain or stiffness. CARDIOVASCULAR: No chest pain or pressure. No palpitations. PULMONARY: No shortness of breath, cough or wheeze. GASTROINTESTINAL: No abdominal pain, nausea, vomiting or diarrhea, melena or bright red blood per rectum. GENITOURINARY: No urinary frequency, urgency, hesitancy or dysuria. MUSCULOSKELETAL: No joint or muscle pain, no back pain, no recent trauma. DERMATOLOGIC: No rash, no itching, no lesions. ENDOCRINE: No  polyuria, polydipsia, no heat or cold intolerance. No recent change in weight. HEMATOLOGICAL: No anemia or easy bruising or bleeding. NEUROLOGIC: No headache, seizures, numbness, tingling or weakness. PSYCHIATRIC: No depression, no loss of interest in normal activity or change in sleep pattern.     Exam:   BP (!) 160/82 (BP Location: Right Arm, Patient Position: Sitting, Cuff Size: Large)   Ht 5\' 6"  (1.676 m)   Wt 208 lb (94.3 kg)   LMP 07/26/2016   BMI 33.57 kg/m   Body mass index is 33.57 kg/m.  General appearance : Well developed well nourished female. No acute distress HEENT: Eyes: no retinal hemorrhage or exudates,  Neck supple, trachea midline, no carotid bruits, no thyroidmegaly Lungs: Clear to auscultation, no rhonchi or wheezes, or rib retractions  Heart: Regular rate and rhythm, no murmurs or gallops Breast:Examined in sitting and supine position were symmetrical in appearance, no palpable masses or tenderness,  no skin retraction, no nipple inversion, no nipple discharge, no skin discoloration, no axillary or supraclavicular lymphadenopathy Abdomen: no palpable masses or tenderness, no rebound or guarding Extremities: no edema or skin discoloration or tenderness  Pelvic: Vulva: Normal             Vagina: No gross lesions or discharge.  Wet prep done.  Cervix: No gross lesions or discharge  Uterus  AV, normal size, shape and consistency, non-tender and mobile  Adnexa  Without masses or tenderness  Anus: Normal except non-thrombosed external hemorrhoids  Wet prep negative   Assessment/Plan:  47 y.o. female for annual exam   1. Well female exam with routine gynecological exam Normal gynecologic exam.  Pap test negative with negative high-risk HPV November 2018.  Will repeat Pap at 2 to 3 years.  Breast exam normal.  Will schedule screening mammogram now.  Health labs with family physician.  Osteopenia on bone density August 2018.  Vitamin D supplements, calcium rich  nutrition and regular weightbearing physical activity recommended.  2. Tubal ligation status  3. Vulvar itching Wet prep negative.  Normal vulvar exam today.  Kenalog cream represcribed for applications as needed. - WET PREP FOR TRICH, YEAST, CLUE  4. Type 1 diabetes mellitus with ketoacidosis without coma (HCC) Followed by endocrinology.  Other orders - triamcinolone cream (KENALOG) 0.1 %; Apply 1 application topically daily.  Counseling on above issues and coordination of care more than 50% for 10 minutes.  Molly Del MD, 8:40 AM 07/26/2017

## 2017-07-30 ENCOUNTER — Other Ambulatory Visit: Payer: Self-pay | Admitting: Endocrinology

## 2017-07-30 ENCOUNTER — Encounter: Payer: Self-pay | Admitting: Obstetrics & Gynecology

## 2017-07-30 NOTE — Patient Instructions (Addendum)
1. Well female exam with routine gynecological exam Normal gynecologic exam.  Pap test negative with negative high-risk HPV November 2018.  Will repeat Pap at 2 to 3 years.  Breast exam normal.  Will schedule screening mammogram now.  Health labs with family physician.  Osteopenia on bone density August 2018.  Vitamin D supplements, calcium rich nutrition and regular weightbearing physical activity recommended.  2. Tubal ligation status  3. Vulvar itching Wet prep negative.  Normal vulvar exam today.  Kenalog cream represcribed for applications as needed. - WET PREP FOR TRICH, YEAST, CLUE  4. Type 1 diabetes mellitus with ketoacidosis without coma (HCC) Followed by endocrinology.  Other orders - triamcinolone cream (KENALOG) 0.1 %; Apply 1 application topically daily.  Molly [name], it was a pleasure seeing you today!

## 2017-10-15 ENCOUNTER — Other Ambulatory Visit: Payer: Self-pay | Admitting: Endocrinology

## 2017-10-21 ENCOUNTER — Other Ambulatory Visit: Payer: Self-pay | Admitting: Endocrinology

## 2017-10-24 ENCOUNTER — Ambulatory Visit: Payer: BLUE CROSS/BLUE SHIELD | Admitting: Endocrinology

## 2017-11-13 ENCOUNTER — Other Ambulatory Visit: Payer: Self-pay | Admitting: Endocrinology

## 2017-11-13 ENCOUNTER — Other Ambulatory Visit: Payer: Self-pay | Admitting: Obstetrics & Gynecology

## 2017-11-13 DIAGNOSIS — Z1231 Encounter for screening mammogram for malignant neoplasm of breast: Secondary | ICD-10-CM

## 2017-12-02 ENCOUNTER — Other Ambulatory Visit: Payer: Self-pay | Admitting: Endocrinology

## 2017-12-25 ENCOUNTER — Encounter: Payer: Self-pay | Admitting: Endocrinology

## 2017-12-25 ENCOUNTER — Ambulatory Visit: Payer: BLUE CROSS/BLUE SHIELD

## 2017-12-25 LAB — HM DIABETES EYE EXAM

## 2017-12-31 ENCOUNTER — Other Ambulatory Visit: Payer: Self-pay | Admitting: Endocrinology

## 2018-01-02 ENCOUNTER — Encounter: Payer: Self-pay | Admitting: Family

## 2018-01-02 ENCOUNTER — Encounter (INDEPENDENT_AMBULATORY_CARE_PROVIDER_SITE_OTHER): Payer: Self-pay

## 2018-01-02 ENCOUNTER — Ambulatory Visit: Payer: BLUE CROSS/BLUE SHIELD | Admitting: Family

## 2018-01-02 ENCOUNTER — Other Ambulatory Visit (INDEPENDENT_AMBULATORY_CARE_PROVIDER_SITE_OTHER): Payer: BLUE CROSS/BLUE SHIELD

## 2018-01-02 VITALS — BP 130/72 | HR 96 | Temp 98.7°F | Ht 66.0 in | Wt 208.0 lb

## 2018-01-02 DIAGNOSIS — J019 Acute sinusitis, unspecified: Secondary | ICD-10-CM

## 2018-01-02 DIAGNOSIS — F319 Bipolar disorder, unspecified: Secondary | ICD-10-CM | POA: Diagnosis not present

## 2018-01-02 DIAGNOSIS — E782 Mixed hyperlipidemia: Secondary | ICD-10-CM | POA: Diagnosis not present

## 2018-01-02 DIAGNOSIS — E1010 Type 1 diabetes mellitus with ketoacidosis without coma: Secondary | ICD-10-CM | POA: Diagnosis not present

## 2018-01-02 DIAGNOSIS — M810 Age-related osteoporosis without current pathological fracture: Secondary | ICD-10-CM

## 2018-01-02 DIAGNOSIS — Z23 Encounter for immunization: Secondary | ICD-10-CM

## 2018-01-02 LAB — LIPID PANEL
Cholesterol: 215 mg/dL — ABNORMAL HIGH (ref 0–200)
HDL: 93.8 mg/dL (ref >39.00)
LDL Cholesterol: 105 mg/dL — ABNORMAL HIGH (ref 0–99)
NonHDL: 120.97
Total CHOL/HDL Ratio: 2
Triglycerides: 80 mg/dL (ref 0.0–149.0)
VLDL: 16 mg/dL (ref 0.0–40.0)

## 2018-01-02 LAB — COMPREHENSIVE METABOLIC PANEL
ALK PHOS: 88 U/L (ref 39–117)
ALT: 21 U/L (ref 0–35)
AST: 18 U/L (ref 0–37)
Albumin: 4.4 g/dL (ref 3.5–5.2)
BUN: 11 mg/dL (ref 6–23)
CO2: 29 mEq/L (ref 19–32)
Calcium: 9.3 mg/dL (ref 8.4–10.5)
Chloride: 100 mEq/L (ref 96–112)
Creatinine, Ser: 0.74 mg/dL (ref 0.40–1.20)
GFR: 88.94 mL/min (ref >60.00)
Glucose, Bld: 392 mg/dL — ABNORMAL HIGH (ref 70–99)
Potassium: 4.7 mEq/L (ref 3.5–5.1)
Sodium: 135 mEq/L (ref 135–145)
TOTAL PROTEIN: 6.9 g/dL (ref 6.0–8.3)
Total Bilirubin: 0.4 mg/dL (ref 0.2–1.2)

## 2018-01-02 MED ORDER — CARBAMAZEPINE 200 MG PO TABS: 400.0000 mg | ORAL_TABLET | Freq: Two times a day (BID) | ORAL | Status: DC

## 2018-01-02 MED ORDER — DOXYCYCLINE HYCLATE 100 MG PO TABS: 100.0000 mg | ORAL_TABLET | Freq: Two times a day (BID) | ORAL | 0 refills | Status: AC

## 2018-01-02 MED ORDER — ROSUVASTATIN CALCIUM 40 MG PO TABS: 40.0000 mg | ORAL_TABLET | Freq: Every day | ORAL | 3 refills | Status: DC

## 2018-01-02 MED ORDER — FLUTICASONE PROPIONATE (INHAL) 50 MCG/BLIST IN AEPB: 1.0000 | INHALATION_SPRAY | Freq: Two times a day (BID) | RESPIRATORY_TRACT | 3 refills | Status: DC

## 2018-01-02 NOTE — Addendum Note (Signed)
Addended by: Karma GanjaSMITH, Danisa Kopec J on: 01/02/2018 11:36 AM   Modules accepted: Orders

## 2018-01-02 NOTE — Progress Notes (Signed)
Molly [name] is a 47 y.o. female with the following history as recorded in EpicCare:  Patient Active Problem List   Diagnosis Date Noted  . Sinusitis 04/20/2017  . Otalgia of both ears 03/30/2016  . Strain of chest wall 11/30/2015  . URI (upper respiratory infection) 11/12/2015  . Amenorrhea, secondary 03/02/2015  . Recurrent acute sinusitis 05/23/2014  . Anemia 04/08/2014  . Diabetic ketoacidosis without coma associated with type 1 diabetes mellitus ([place])   . HLD (hyperlipidemia) 04/07/2014  . 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 Fever 04/07/2014  . DKA (diabetic ketoacidoses) ([place]) 04/06/2014  . Nausea & vomiting 04/06/2014  . Bipolar 1 disorder ([place])   . Pain in joint, upper arm 10/22/2013  . Routine general medical examination at a health care facility 10/04/2013  . Visit for occupational health examination 11/10/2012  . Nonspecific abnormal electrocardiogram (ECG) (EKG) 10/02/2012  . Osteoporosis 10/02/2012  . Quit smoking 04/24/2012  . Arthralgia of hand, left 12/01/2011  . Encounter for long-term (current) use of other medications 09/14/2010  . HEMATURIA UNSPECIFIED 08/11/2009  . PURE HYPERCHOLESTEROLEMIA 08/05/2008  . THROMBOCYTOPENIA 05/07/2008  . CONTUSION, ANKLE 08/28/2007  . TINEA VERSICOLOR 07/13/2007  . SEBACEOUS CYST, INFECTED 07/13/2007  . COUGH 04/18/2007  . Migraine 03/06/2007  . Seasonal and perennial allergic rhinitis 03/06/2007  . Type 1 diabetes mellitus ([place]) 08/03/2006  . Depression 08/03/2006    Current Outpatient Medications  Medication Sig Dispense Refill  . Alcohol Swabs (ALCOHOL PREP) 70 % PADS 8/day  250.01 800 each 3  . alendronate (FOSAMAX) 70 MG tablet TAKE 1 TABLET BY MOUTH EVERY 7 DAYS. TAKE WITH FULL GLASS OF WATER ON AN EMPTY STOMACH 24 tablet 2  . Calcium Carbonate (CALCIUM 600 PO) Take 1 tablet by mouth 2 (two) times daily.     . carbamazepine (TEGRETOL) 200 MG tablet Take 2-4 tablets (400-800 mg total) by mouth 2 (two) times  daily. 5 in the morning/ 1 in the evening    . cetirizine-pseudoephedrine (ZYRTEC-D) 5-120 MG tablet Take 1 tablet by mouth daily. 90 tablet 3  . cyclobenzaprine (FLEXERIL) 10 MG tablet Take 10 mg by mouth 3 (three) times daily as needed for muscle spasms.    . fluticasone (FLOVENT DISKUS) 50 MCG/BLIST diskus inhaler Inhale 1 puff into the lungs 2 (two) times daily. 1 Inhaler 3  . glucose blood (FREESTYLE LITE) test strip 8/day dx 250.01, and lancets 800 each 3  . Insulin Infusion Pump Supplies (MINIMED INFUSION SET-MMT 398) MISC Inject 1 Device into the skin 3 days. Change every 3 days 30 each 3  . Insulin Infusion Pump Supplies (PARADIGM RESERVOIR 3ML) MISC Inject 1 Device into the skin 3 days. Change every 3 days 30 each 3  . insulin regular (NOVOLIN R) 100 units/mL injection INJECT A TOTAL OF 100 UNITS UNDER THE SKIN EVERY DAY VIA INSULIN PUMP 90 mL 0  . lamoTRIgine (LAMICTAL) 200 MG tablet Take 200 mg by mouth 2 (two) times daily.     . Lancets (FREESTYLE) lancets Use as instructed dx 250.01 800 each 3  . Multiple Vitamin (MULTIVITAMIN) tablet Take 1 tablet by mouth daily.    . ondansetron (ZOFRAN) 4 MG tablet TAKE 1 TABLET BY MOUTH EVERY 8 HOURS AS NEEDED FOR NAUSEA AND VOMITING 90 tablet 4  . rizatriptan (MAXALT) 10 MG tablet Take 1 tablet (10 mg total) by mouth as needed for migraine. May repeat in 2 hours if needed 100 tablet 3  . rosuvastatin (CRESTOR) 40 MG tablet  Take 1 tablet (40 mg total) by mouth daily. 90 tablet 3  . triamcinolone cream (KENALOG) 0.1 % Apply 1 application topically daily. 45 g 5  . VIT C-CHOLECALCIFEROL-ROSE HIP PO Take 1 tablet by mouth daily.     . ziprasidone (GEODON) 60 MG capsule Take 120 mg by mouth at bedtime. Take 2 by mouth at bedtime    . zolpidem (AMBIEN) 10 MG tablet Take 10 mg by mouth at bedtime.  1  . doxycycline (VIBRA-TABS) 100 MG tablet Take 1 tablet (100 mg total) by mouth 2 (two) times daily for 10 days. 14 tablet 0   No current  facility-administered medications for this visit.     Allergies: Varenicline tartrate  Past Medical History:  Diagnosis Date  . ALLERGIC RHINITIS CAUSE UNSPECIFIED 03/06/2007  . Bipolar 1 disorder ([place])   . CONTUSION, ANKLE 08/28/2007  . DEPRESSION 08/03/2006  . DIABETES MELLITUS, TYPE I 08/03/2006  . DM retinopathy ([place])   . Encounter for long-term (current) use of other medications   . Hyperlipidemia   . MIGRAINE, CHRONIC 03/06/2007  . Osteoporosis   . SEBACEOUS CYST, INFECTED 07/13/2007  . SMOKER 08/11/2009  . THROMBOCYTOPENIA 05/07/2008  . TINEA VERSICOLOR 07/13/2007    Past Surgical History:  Procedure Laterality Date  . Abnormal U/S  12/10/1996  . ELECTROCARDIOGRAM  06/13/2006  . TUBAL LIGATION      Family History  Problem Relation Age of Onset  . Diabetes Father        (Oral Agents)  . Diabetes Paternal Grandmother   . Hypertension Paternal Grandmother   . Diabetes Paternal Grandfather   . Cancer Paternal Grandfather        colon  . Hypertension Paternal Grandfather     Social History   Tobacco Use  . Smoking status: Former Smoker    Last attempt to quit: 10/16/2010    Years since quitting: 7.2  . Smokeless tobacco: Never Used  Substance Use Topics  . Alcohol use: No    Subjective:  Patient presents to transfer care from Dr. Loanne [name]; History of Type 1 Diabetes/ osteoporosis- will need to continue with her endocrinologist; History of Bipolar Disorder- will need to continue with psychiatrist.  Under care of GYN;  Requesting flu shot today;  Complaining of pain/ fullness in both ears x 1 month; notes she is prone to "low grade ear infections." Uses Zyrtec-D and Flovent on a daily basis; has allergic asthma; is suspicious that both of her ears are "red and infected today." Cannot tolerate any type of allergy nasal spray; has never seen an ENT due to concerns about her ears.      Objective:  Vitals:   01/02/18 1027  BP: 130/72  Pulse: 96  Temp: 98.7 F (37.1 C)   TempSrc: Oral  SpO2: 97%  Weight: 208 lb 0.6 oz (94.4 kg)  Height: '5\' 6"'  (1.676 m)    General: Well developed, well nourished, in no acute distress  Skin : Warm and dry.  Head: Normocephalic and atraumatic  Eyes: Sclera and conjunctiva clear; pupils round and reactive to light; extraocular movements intact  Ears: External normal; canals clear; tympanic membranes congested  Oropharynx: Pink, supple. No suspicious lesions  Neck: Supple without thyromegaly, adenopathy  Lungs: Respirations unlabored;  Neurologic: Alert and oriented; speech intact; face symmetrical; moves all extremities well; CNII-XII intact without focal deficit   Assessment:  1. Acute sinusitis, recurrence not specified, unspecified location   2. Mixed hyperlipidemia   3. Type 1 diabetes mellitus  with ketoacidosis without coma ([place])   4. Bipolar 1 disorder ([place])   5. Osteoporosis, unspecified osteoporosis type, unspecified pathological fracture presence     Plan:  1. Patient is adamant that she has an infection; will treat with Doxycyline 100 mg bid x 7 days since this is the treatment she notes worked from her previous provider; continue Zyrtec D and Flovent; 2. Refill on Crestor; update CMP, lipid panel today; 3. Continue with endocrine; 4. Continue with psychiatry; 5. Continue with endocrine.   Mammogram scheduled for January 2020;    No follow-ups on file.  Orders Placed This Encounter  Procedures  . Lipid panel    Standing Status:   Future    Number of Occurrences:   1    Standing Expiration Date:   01/03/2019  . Comp Met (CMET)    Standing Status:   Future    Number of Occurrences:   1    Standing Expiration Date:   01/02/2019    Requested Prescriptions   Signed Prescriptions Disp Refills  . carbamazepine (TEGRETOL) 200 MG tablet      Sig: Take 2-4 tablets (400-800 mg total) by mouth 2 (two) times daily. 5 in the morning/ 1 in the evening  . rosuvastatin (CRESTOR) 40 MG tablet 90 tablet 3    Sig:  Take 1 tablet (40 mg total) by mouth daily.  . fluticasone (FLOVENT DISKUS) 50 MCG/BLIST diskus inhaler 1 Inhaler 3    Sig: Inhale 1 puff into the lungs 2 (two) times daily.  Marland Kitchen doxycycline (VIBRA-TABS) 100 MG tablet 14 tablet 0    Sig: Take 1 tablet (100 mg total) by mouth 2 (two) times daily for 10 days.

## 2018-01-12 ENCOUNTER — Telehealth: Payer: Self-pay | Admitting: Endocrinology

## 2018-01-12 NOTE — Telephone Encounter (Signed)
[employer] called stating document did not have A1C or Diagnoses code. Please Refax, thanks Fax # [phone]

## 2018-01-15 NOTE — Telephone Encounter (Signed)
Paperwork re-faxed today with missing info provided

## 2018-01-29 ENCOUNTER — Ambulatory Visit: Payer: BLUE CROSS/BLUE SHIELD

## 2018-03-02 ENCOUNTER — Ambulatory Visit: Payer: BLUE CROSS/BLUE SHIELD | Admitting: Family

## 2018-03-14 ENCOUNTER — Other Ambulatory Visit: Payer: Self-pay | Admitting: Endocrinology

## 2018-03-20 ENCOUNTER — Other Ambulatory Visit: Payer: Self-pay | Admitting: Endocrinology

## 2018-04-10 ENCOUNTER — Other Ambulatory Visit: Payer: Self-pay | Admitting: Endocrinology

## 2018-04-27 ENCOUNTER — Other Ambulatory Visit: Payer: Self-pay | Admitting: Endocrinology

## 2018-04-27 ENCOUNTER — Other Ambulatory Visit: Payer: Self-pay | Admitting: Family

## 2018-04-27 MED ORDER — CETIRIZINE-PSEUDOEPHEDRINE ER 5-120 MG PO TB12: 1.0000 | ORAL_TABLET | Freq: Two times a day (BID) | ORAL | 1 refills | Status: DC

## 2018-04-27 NOTE — Telephone Encounter (Signed)
Please see message from Dr. Ellison below. 

## 2018-04-27 NOTE — Telephone Encounter (Signed)
Please forward refill request to pt's new primary care provider.  

## 2018-06-04 ENCOUNTER — Telehealth: Payer: Self-pay | Admitting: Endocrinology

## 2018-06-04 DIAGNOSIS — E1010 Type 1 diabetes mellitus with ketoacidosis without coma: Secondary | ICD-10-CM

## 2018-06-04 NOTE — Telephone Encounter (Signed)
done

## 2018-06-04 NOTE — Telephone Encounter (Signed)
Please advise 

## 2018-06-04 NOTE — Telephone Encounter (Signed)
Patient is calling stating she is having to switch doctors due to insurance purposes and they are needing a referral sent. [facility] Off of [address] Fax # [phone] Attn. Meriam Sprague

## 2018-08-02 ENCOUNTER — Encounter: Payer: BLUE CROSS/BLUE SHIELD | Admitting: Obstetrics & Gynecology

## 2018-10-17 ENCOUNTER — Encounter: Payer: Self-pay | Admitting: Gynecology

## 2018-12-07 ENCOUNTER — Other Ambulatory Visit: Payer: Self-pay | Admitting: Family

## 2018-12-10 ENCOUNTER — Other Ambulatory Visit: Payer: Self-pay | Admitting: Family

## 2018-12-10 MED ORDER — ROSUVASTATIN CALCIUM 40 MG PO TABS: ORAL_TABLET | ORAL | 0 refills | Status: AC

## 2018-12-10 NOTE — Telephone Encounter (Signed)
Pharmacy called and is requesting to have pts rosuvastatin changed to a 90 day supply. Please advise.   [facility] - [address], [place] [address]  Phone: [phone] Fax: [phone]  Not a 24 hour pharmacy; exact hours not known.

## 2019-03-02 ENCOUNTER — Other Ambulatory Visit: Payer: Self-pay | Admitting: Family

## 2019-03-04 ENCOUNTER — Telehealth: Payer: Self-pay | Admitting: Family

## 2019-03-04 NOTE — Telephone Encounter (Signed)
This patient has not been seen since December 2019; it looks like she has been under care of [facility] practice. We cannot refill medications for her.

## 2019-03-04 NOTE — Telephone Encounter (Signed)
       1. Which medications need to be refilled? (please list name of each medication and dose if known) rosuvastatin (CRESTOR) 40 MG tablet and FLOVENT DISKUS 50 MCG/BLIST diskus inhaler  2. Which pharmacy/location (including street and city if local pharmacy) is medication to be sent to?[facility] - [place], [place] - [address]  3. Do they need a 30 day or 90 day supply? 90

## 2019-03-04 NOTE — Telephone Encounter (Signed)
Patient states provider hadn't been switched over and that her meds were on automatic refill.

## 2019-05-11 ENCOUNTER — Other Ambulatory Visit: Payer: Self-pay | Admitting: Family

## 2020-07-22 ENCOUNTER — Other Ambulatory Visit: Payer: Self-pay

## 2020-07-22 ENCOUNTER — Ambulatory Visit (HOSPITAL_COMMUNITY)
Admission: RE | Admit: 2020-07-22 | Discharge: 2020-07-22 | Disposition: A | Discharge status: Home / Self Care | Payer: Managed Care, Other (non HMO) | Source: Ambulatory Visit | Attending: Cardiovascular Disease | Admitting: Cardiovascular Disease

## 2020-07-22 ENCOUNTER — Other Ambulatory Visit (HOSPITAL_COMMUNITY): Payer: Self-pay | Admitting: Medical

## 2020-07-22 DIAGNOSIS — M79604 Pain in right leg: Secondary | ICD-10-CM

## 2020-11-09 ENCOUNTER — Inpatient Hospital Stay (HOSPITAL_COMMUNITY)
Admission: AD | Admit: 2020-11-09 | Discharge: 2020-11-19 | DRG: 628 | Disposition: A | Discharge status: Skilled Nursing Facility | Payer: Managed Care, Other (non HMO) | Attending: Family Medicine | Admitting: Family Medicine

## 2020-11-09 ENCOUNTER — Inpatient Hospital Stay (HOSPITAL_COMMUNITY): Payer: Managed Care, Other (non HMO)

## 2020-11-09 ENCOUNTER — Other Ambulatory Visit: Payer: Self-pay

## 2020-11-09 ENCOUNTER — Encounter (HOSPITAL_COMMUNITY): Payer: Self-pay | Admitting: Internal Medicine

## 2020-11-09 DIAGNOSIS — F419 Anxiety disorder, unspecified: Secondary | ICD-10-CM | POA: Diagnosis present

## 2020-11-09 DIAGNOSIS — M009 Pyogenic arthritis, unspecified: Secondary | ICD-10-CM | POA: Diagnosis not present

## 2020-11-09 DIAGNOSIS — Z87891 Personal history of nicotine dependence: Secondary | ICD-10-CM

## 2020-11-09 DIAGNOSIS — E1043 Type 1 diabetes mellitus with diabetic autonomic (poly)neuropathy: Secondary | ICD-10-CM | POA: Diagnosis present

## 2020-11-09 DIAGNOSIS — I10 Essential (primary) hypertension: Secondary | ICD-10-CM | POA: Diagnosis present

## 2020-11-09 DIAGNOSIS — Z794 Long term (current) use of insulin: Secondary | ICD-10-CM

## 2020-11-09 DIAGNOSIS — R112 Nausea with vomiting, unspecified: Secondary | ICD-10-CM | POA: Diagnosis not present

## 2020-11-09 DIAGNOSIS — E1065 Type 1 diabetes mellitus with hyperglycemia: Secondary | ICD-10-CM | POA: Diagnosis present

## 2020-11-09 DIAGNOSIS — F319 Bipolar disorder, unspecified: Secondary | ICD-10-CM | POA: Diagnosis present

## 2020-11-09 DIAGNOSIS — E785 Hyperlipidemia, unspecified: Secondary | ICD-10-CM | POA: Diagnosis present

## 2020-11-09 DIAGNOSIS — J309 Allergic rhinitis, unspecified: Secondary | ICD-10-CM | POA: Diagnosis present

## 2020-11-09 DIAGNOSIS — M869 Osteomyelitis, unspecified: Secondary | ICD-10-CM | POA: Diagnosis not present

## 2020-11-09 DIAGNOSIS — Z833 Family history of diabetes mellitus: Secondary | ICD-10-CM

## 2020-11-09 DIAGNOSIS — M8618 Other acute osteomyelitis, other site: Secondary | ICD-10-CM | POA: Diagnosis present

## 2020-11-09 DIAGNOSIS — M86151 Other acute osteomyelitis, right femur: Secondary | ICD-10-CM | POA: Diagnosis present

## 2020-11-09 DIAGNOSIS — B9561 Methicillin susceptible Staphylococcus aureus infection as the cause of diseases classified elsewhere: Secondary | ICD-10-CM | POA: Diagnosis present

## 2020-11-09 DIAGNOSIS — E1069 Type 1 diabetes mellitus with other specified complication: Principal | ICD-10-CM | POA: Diagnosis present

## 2020-11-09 DIAGNOSIS — E10319 Type 1 diabetes mellitus with unspecified diabetic retinopathy without macular edema: Secondary | ICD-10-CM | POA: Diagnosis present

## 2020-11-09 DIAGNOSIS — A4901 Methicillin susceptible Staphylococcus aureus infection, unspecified site: Secondary | ICD-10-CM | POA: Diagnosis not present

## 2020-11-09 DIAGNOSIS — Z23 Encounter for immunization: Secondary | ICD-10-CM

## 2020-11-09 DIAGNOSIS — M86161 Other acute osteomyelitis, right tibia and fibula: Secondary | ICD-10-CM | POA: Diagnosis present

## 2020-11-09 DIAGNOSIS — Z79899 Other long term (current) drug therapy: Secondary | ICD-10-CM | POA: Diagnosis not present

## 2020-11-09 DIAGNOSIS — E876 Hypokalemia: Secondary | ICD-10-CM | POA: Diagnosis present

## 2020-11-09 DIAGNOSIS — D62 Acute posthemorrhagic anemia: Secondary | ICD-10-CM | POA: Diagnosis not present

## 2020-11-09 DIAGNOSIS — K2970 Gastritis, unspecified, without bleeding: Secondary | ICD-10-CM | POA: Diagnosis present

## 2020-11-09 DIAGNOSIS — Z9889 Other specified postprocedural states: Secondary | ICD-10-CM | POA: Diagnosis not present

## 2020-11-09 DIAGNOSIS — R42 Dizziness and giddiness: Secondary | ICD-10-CM | POA: Diagnosis not present

## 2020-11-09 DIAGNOSIS — F32A Depression, unspecified: Secondary | ICD-10-CM

## 2020-11-09 DIAGNOSIS — Z888 Allergy status to other drugs, medicaments and biological substances status: Secondary | ICD-10-CM

## 2020-11-09 DIAGNOSIS — Z9641 Presence of insulin pump (external) (internal): Secondary | ICD-10-CM | POA: Diagnosis present

## 2020-11-09 DIAGNOSIS — Z8249 Family history of ischemic heart disease and other diseases of the circulatory system: Secondary | ICD-10-CM

## 2020-11-09 DIAGNOSIS — G4700 Insomnia, unspecified: Secondary | ICD-10-CM | POA: Diagnosis present

## 2020-11-09 DIAGNOSIS — M00061 Staphylococcal arthritis, right knee: Secondary | ICD-10-CM | POA: Diagnosis not present

## 2020-11-09 DIAGNOSIS — M8619 Other acute osteomyelitis, multiple sites: Secondary | ICD-10-CM | POA: Diagnosis not present

## 2020-11-09 DIAGNOSIS — K3184 Gastroparesis: Secondary | ICD-10-CM | POA: Diagnosis present

## 2020-11-09 DIAGNOSIS — M810 Age-related osteoporosis without current pathological fracture: Secondary | ICD-10-CM | POA: Diagnosis present

## 2020-11-09 DIAGNOSIS — E43 Unspecified severe protein-calorie malnutrition: Secondary | ICD-10-CM | POA: Diagnosis present

## 2020-11-09 DIAGNOSIS — Z6822 Body mass index (BMI) 22.0-22.9, adult: Secondary | ICD-10-CM

## 2020-11-09 DIAGNOSIS — M25561 Pain in right knee: Secondary | ICD-10-CM | POA: Diagnosis not present

## 2020-11-09 DIAGNOSIS — Z993 Dependence on wheelchair: Secondary | ICD-10-CM

## 2020-11-09 DIAGNOSIS — Z7983 Long term (current) use of bisphosphonates: Secondary | ICD-10-CM

## 2020-11-09 DIAGNOSIS — M25569 Pain in unspecified knee: Secondary | ICD-10-CM

## 2020-11-09 DIAGNOSIS — Z20822 Contact with and (suspected) exposure to covid-19: Secondary | ICD-10-CM | POA: Diagnosis present

## 2020-11-09 DIAGNOSIS — E109 Type 1 diabetes mellitus without complications: Secondary | ICD-10-CM | POA: Diagnosis present

## 2020-11-09 DIAGNOSIS — R110 Nausea: Secondary | ICD-10-CM | POA: Diagnosis not present

## 2020-11-09 LAB — CBC WITH DIFFERENTIAL/PLATELET
Abs Immature Granulocytes: 0.06 10*3/uL (ref 0.00–0.07)
Basophils Absolute: 0 10*3/uL (ref 0.0–0.1)
Basophils Relative: 0 %
Eosinophils Absolute: 0 10*3/uL (ref 0.0–0.5)
Eosinophils Relative: 0 %
HCT: 34 % — ABNORMAL LOW (ref 36.0–46.0)
Hemoglobin: 10.8 g/dL — ABNORMAL LOW (ref 12.0–15.0)
Immature Granulocytes: 1 %
Lymphocytes Relative: 10 %
Lymphs Abs: 0.9 10*3/uL (ref 0.7–4.0)
MCH: 26.7 pg (ref 26.0–34.0)
MCHC: 31.8 g/dL (ref 30.0–36.0)
MCV: 84 fL (ref 80.0–100.0)
Monocytes Absolute: 0.7 10*3/uL (ref 0.1–1.0)
Monocytes Relative: 8 %
Neutro Abs: 7.3 10*3/uL (ref 1.7–7.7)
Neutrophils Relative %: 81 %
Platelets: 391 10*3/uL (ref 150–400)
RBC: 4.05 MIL/uL (ref 3.87–5.11)
RDW: 15.2 % (ref 11.5–15.5)
WBC: 9 10*3/uL (ref 4.0–10.5)
nRBC: 0 % (ref 0.0–0.2)

## 2020-11-09 LAB — COMPREHENSIVE METABOLIC PANEL
ALT: 13 U/L (ref 0–44)
AST: 14 U/L — ABNORMAL LOW (ref 15–41)
Albumin: 3.1 g/dL — ABNORMAL LOW (ref 3.5–5.0)
Alkaline Phosphatase: 90 U/L (ref 38–126)
Anion gap: 12 (ref 5–15)
BUN: 12 mg/dL (ref 6–20)
CO2: 23 mmol/L (ref 22–32)
Calcium: 9.1 mg/dL (ref 8.9–10.3)
Chloride: 99 mmol/L (ref 98–111)
Creatinine, Ser: 0.52 mg/dL (ref 0.44–1.00)
GFR, Estimated: >60 mL/min (ref >60)
Glucose, Bld: 118 mg/dL — ABNORMAL HIGH (ref 70–99)
Potassium: 2.4 mmol/L — CL (ref 3.5–5.1)
Sodium: 134 mmol/L — ABNORMAL LOW (ref 135–145)
Total Bilirubin: 0.9 mg/dL (ref 0.3–1.2)
Total Protein: 7.5 g/dL (ref 6.5–8.1)

## 2020-11-09 LAB — C-REACTIVE PROTEIN: CRP: 5.4 mg/dL — ABNORMAL HIGH (ref <1.0)

## 2020-11-09 LAB — RESP PANEL BY RT-PCR (FLU A&B, COVID) ARPGX2
Influenza A by PCR: NEGATIVE
Influenza B by PCR: NEGATIVE
SARS Coronavirus 2 by RT PCR: NEGATIVE

## 2020-11-09 LAB — GLUCOSE, CAPILLARY
Glucose-Capillary: 109 mg/dL — ABNORMAL HIGH (ref 70–99)
Glucose-Capillary: 187 mg/dL — ABNORMAL HIGH (ref 70–99)
Glucose-Capillary: 209 mg/dL — ABNORMAL HIGH (ref 70–99)

## 2020-11-09 LAB — SEDIMENTATION RATE: Sed Rate: 94 mm/hr — ABNORMAL HIGH (ref 0–22)

## 2020-11-09 LAB — LACTIC ACID, PLASMA: Lactic Acid, Venous: 1.7 mmol/L (ref 0.5–1.9)

## 2020-11-09 MED ORDER — GADOBUTROL 1 MMOL/ML IV SOLN
10.0000 mL | Freq: Once | INTRAVENOUS | Status: AC | PRN
  Administered 2020-11-09: 10 mL via INTRAVENOUS

## 2020-11-09 MED ORDER — OXYCODONE-ACETAMINOPHEN 5-325 MG PO TABS
1.0000 | ORAL_TABLET | ORAL | Status: DC | PRN
  Administered 2020-11-09 – 2020-11-18 (×6): 2 via ORAL
  Filled 2020-11-09 (×7): qty 2

## 2020-11-09 MED ORDER — ONDANSETRON 4 MG PO TBDP: 4.0000 mg | ORAL_TABLET | Freq: Four times a day (QID) | ORAL | Status: DC | PRN

## 2020-11-09 MED ORDER — INSULIN PUMP
Freq: Three times a day (TID) | SUBCUTANEOUS | Status: DC
  Filled 2020-11-09: qty 1

## 2020-11-09 MED ORDER — INSULIN DETEMIR 100 UNIT/ML ~~LOC~~ SOLN
4.0000 [IU] | Freq: Once | SUBCUTANEOUS | Status: AC
  Administered 2020-11-09: 4 [IU] via SUBCUTANEOUS
  Filled 2020-11-09: qty 0.04

## 2020-11-09 MED ORDER — ZOLPIDEM TARTRATE 5 MG PO TABS: 5.0000 mg | ORAL_TABLET | Freq: Every evening | ORAL | Status: DC | PRN

## 2020-11-09 MED ORDER — ONDANSETRON 4 MG PO TBDP
4.0000 mg | ORAL_TABLET | Freq: Three times a day (TID) | ORAL | Status: DC | PRN
Start: 2020-11-09 — End: 2020-11-09
  Filled 2020-11-09: qty 1

## 2020-11-09 MED ORDER — PROCHLORPERAZINE EDISYLATE 10 MG/2ML IJ SOLN
5.0000 mg | Freq: Four times a day (QID) | INTRAMUSCULAR | Status: AC | PRN
Start: 2020-11-09 — End: 2020-11-10
  Administered 2020-11-09 – 2020-11-10 (×2): 5 mg via INTRAVENOUS
  Filled 2020-11-09 (×2): qty 2

## 2020-11-09 MED ORDER — CARBAMAZEPINE 200 MG PO TABS
200.0000 mg | ORAL_TABLET | Freq: Every morning | ORAL | Status: DC
  Administered 2020-11-10 – 2020-11-19 (×10): 200 mg via ORAL
  Filled 2020-11-09 (×11): qty 1

## 2020-11-09 MED ORDER — ROSUVASTATIN CALCIUM 20 MG PO TABS
40.0000 mg | ORAL_TABLET | Freq: Every day | ORAL | Status: DC
  Administered 2020-11-10 – 2020-11-19 (×8): 40 mg via ORAL
  Filled 2020-11-09 (×3): qty 4
  Filled 2020-11-09: qty 2
  Filled 2020-11-09: qty 4
  Filled 2020-11-09: qty 2
  Filled 2020-11-09 (×2): qty 4

## 2020-11-09 MED ORDER — ENOXAPARIN SODIUM 40 MG/0.4ML IJ SOSY
40.0000 mg | PREFILLED_SYRINGE | INTRAMUSCULAR | Status: DC
  Administered 2020-11-09 – 2020-11-11 (×3): 40 mg via SUBCUTANEOUS
  Filled 2020-11-09 (×3): qty 0.4

## 2020-11-09 MED ORDER — ACETAMINOPHEN 325 MG PO TABS: 650.0000 mg | ORAL_TABLET | Freq: Four times a day (QID) | ORAL | Status: DC | PRN

## 2020-11-09 MED ORDER — CYCLOBENZAPRINE HCL 10 MG PO TABS
10.0000 mg | ORAL_TABLET | Freq: Three times a day (TID) | ORAL | Status: DC | PRN
  Administered 2020-11-11 – 2020-11-14 (×4): 10 mg via ORAL
  Filled 2020-11-09 (×4): qty 1

## 2020-11-09 MED ORDER — ZIPRASIDONE HCL 60 MG PO CAPS
120.0000 mg | ORAL_CAPSULE | Freq: Every day | ORAL | Status: DC
  Administered 2020-11-09 – 2020-11-18 (×9): 120 mg via ORAL
  Filled 2020-11-09 (×11): qty 2

## 2020-11-09 MED ORDER — ONDANSETRON HCL 4 MG/2ML IJ SOLN
4.0000 mg | Freq: Four times a day (QID) | INTRAMUSCULAR | Status: DC | PRN
  Administered 2020-11-09 – 2020-11-13 (×2): 4 mg via INTRAVENOUS
  Filled 2020-11-09 (×2): qty 2

## 2020-11-09 MED ORDER — POTASSIUM CHLORIDE 10 MEQ/100ML IV SOLN
10.0000 meq | INTRAVENOUS | Status: AC
Start: 2020-11-09 — End: 2020-11-10
  Administered 2020-11-09 – 2020-11-10 (×6): 10 meq via INTRAVENOUS
  Filled 2020-11-09 (×5): qty 100

## 2020-11-09 MED ORDER — PROMETHAZINE HCL 25 MG RE SUPP
12.5000 mg | Freq: Four times a day (QID) | RECTAL | Status: DC | PRN
  Administered 2020-11-09: 25 mg via RECTAL
  Filled 2020-11-09: qty 1

## 2020-11-09 MED ORDER — ACETAMINOPHEN 650 MG RE SUPP
650.0000 mg | Freq: Four times a day (QID) | RECTAL | Status: DC | PRN
Start: 2020-11-09 — End: 2020-11-13

## 2020-11-09 MED ORDER — SODIUM CHLORIDE 0.45 % IV SOLN: INTRAVENOUS | Status: DC

## 2020-11-09 MED ORDER — ZOLPIDEM TARTRATE 5 MG PO TABS
5.0000 mg | ORAL_TABLET | Freq: Every day | ORAL | Status: DC
  Administered 2020-11-10 – 2020-11-18 (×9): 5 mg via ORAL
  Filled 2020-11-09 (×10): qty 1

## 2020-11-09 MED ORDER — INSULIN ASPART 100 UNIT/ML IJ SOLN
0.0000 [IU] | INTRAMUSCULAR | Status: DC
  Administered 2020-11-09: 7 [IU] via SUBCUTANEOUS
  Administered 2020-11-10: 4 [IU] via SUBCUTANEOUS

## 2020-11-09 NOTE — Significant Event (Signed)
Rapid Response Event Note   Reason for Call :  Tachypnea and Tachycardia on arrival to unit  Initial Focused Assessment:   Direct admission from primary care office-Alert and oriented, respiratory rate mid 20s, tachycardic 130s, complaining of right knee pain. Knee swollen and red.  Interventions:  BMET, Lactic Acid and CBC ordered per protocol  Netty MD paged to notify of arrival  Plan of Care:   Monitor on floor, call rapid response for any change in condition     MD Notified: 1705 Call Time: 1648 Arrival Time:1652 End Time:1730  Rosaria Ferries, RN

## 2020-11-09 NOTE — Progress Notes (Signed)
    This patient is a direct admit from our office. She came to see [place] 11/04/20 with right knee pain and swelling since early July despite no injury or inciting event. Due to her severe level of pain, swelling, redness, heat, and limited ROM we tried to perform a blind right knee aspiration and were only able to remove 0.5cc of bloody fluid. This was sent for culture which grew Staph aureus.   After some discussion, due to the chronic nature of this problem we are concerned for osteomyelitis, septic arthritis, or gout.   Please hold off on giving her antibiotics until IR is able to perform an aspiration. Hopefully they can remove more fluid than we could and send it off.   We would also like to obtain a new right knee MRI as the one she had done 08/20/20 had severe motion artifact since the patient kept moving during the procedure. The read said she had a partial ACL tear, possible medial meniscus tear, possible hamstring tear, and several bone contusions. The patient denies any known injury or trauma that caused this which is very unusual considering the findings.   Ortho following. Will see patient in AM.  Contact information:  Margarita Rana MD, [facility] PA-C    Jenne Pane, [place] Office [phone] 11/09/2020, 5:13 PM

## 2020-11-09 NOTE — H&P (Addendum)
History and Physical    Molly [name] GEX:[id number] DOB: 11-01-1970 DOA: 11/09/2020  PCP: Pcp, No Patient coming from: Home  Chief Complaint: Right knee infection  HPI: Molly [name] is a 50 y.o. female with medical history significant of diabetes mellitus type 1, bipolar disorder, hyperlipidemia, insomnia. Patient has had three months of persistent right knee pain and swelling. She sought evaluation/management at [facility] three months ago and had a workup for DVT which was negative. She decided to switch practices and saw Dr. Margarita Rana. To help with her symptoms, she laid in bed a lot and stayed off her leg. She used to take ibuprofen but stopped.    Review of Systems: Review of Systems  Constitutional:  Negative for chills and fever.  Respiratory:  Negative for cough and shortness of breath.   Cardiovascular:  Negative for chest pain.  Gastrointestinal:  Positive for constipation, nausea and vomiting (3 weeks). Negative for abdominal pain and diarrhea.  Neurological:  Negative for focal weakness and weakness.   Past Medical History:  Diagnosis Date   ALLERGIC RHINITIS CAUSE UNSPECIFIED 03/06/2007   Bipolar 1 disorder (HCC)    CONTUSION, ANKLE 08/28/2007   DEPRESSION 08/03/2006   DIABETES MELLITUS, TYPE I 08/03/2006   DM retinopathy (HCC)    Encounter for long-term (current) use of other medications    Hyperlipidemia    MIGRAINE, CHRONIC 03/06/2007   Osteoporosis    SEBACEOUS CYST, INFECTED 07/13/2007   SMOKER 08/11/2009   THROMBOCYTOPENIA 05/07/2008   TINEA VERSICOLOR 07/13/2007    Past Surgical History:  Procedure Laterality Date   Abnormal U/S  12/10/1996   ELECTROCARDIOGRAM  06/13/2006   TUBAL LIGATION       reports that she quit smoking about 10 years ago. She has never used smokeless tobacco. She reports that she does not drink alcohol and does not use drugs.  Allergies  Allergen Reactions   Varenicline Tartrate     REACTION: rash    Family History   Problem Relation Age of Onset   Diabetes Father        (Oral Agents)   Diabetes Paternal Grandmother    Hypertension Paternal Grandmother    Diabetes Paternal Grandfather    Cancer Paternal Grandfather        colon   Hypertension Paternal Grandfather    Prior to Admission medications   Medication Sig Start Date End Date Taking? Authorizing Provider  Alcohol Swabs (ALCOHOL PREP) 70 % PADS 8/day  250.01 10/27/11   Romero Belling, MD  alendronate (FOSAMAX) 70 MG tablet TAKE 1 TABLET BY MOUTH EVERY 7 DAYS. TAKE WITH FULL GLASS OF WATER ON AN EMPTY STOMACH 08/01/16   Romero Belling, MD  Calcium Carbonate (CALCIUM 600 PO) Take 1 tablet by mouth 2 (two) times daily.     [provider]  carbamazepine (TEGRETOL) 200 MG tablet Take 2-4 tablets (400-800 mg total) by mouth 2 (two) times daily. 5 in the morning/ 1 in the evening 01/02/18   Olive Bass, FNP  cetirizine-pseudoephedrine (ZYRTEC-D) 5-120 MG tablet Take 1 tablet by mouth daily. 03/22/17   Romero Belling, MD  cetirizine-pseudoephedrine (ZYRTEC-D) 5-120 MG tablet Take 1 tablet by mouth 2 (two) times daily. 04/27/18   Olive Bass, FNP  cyclobenzaprine (FLEXERIL) 10 MG tablet Take 10 mg by mouth 3 (three) times daily as needed for muscle spasms.    [provider]  FLOVENT DISKUS 50 MCG/BLIST diskus inhaler INHALE 1 PUFF BY MOUTH INTO THE LUNGS 2 TIMES DAILY  12/07/18   Olive Bass, FNP  glucose blood (FREESTYLE LITE) test strip 8/day dx 250.01, and lancets 06/01/15   Romero Belling, MD  Insulin Infusion Pump Supplies (MINIMED INFUSION SET-MMT 398) MISC Inject 1 Device into the skin 3 days. Change every 3 days 06/01/15   Romero Belling, MD  Insulin Infusion Pump Supplies (PARADIGM RESERVOIR ) MISC Inject 1 Device into the skin 3 days. Change every 3 days 06/01/15   Romero Belling, MD  insulin regular (NOVOLIN R) 100 units/mL injection INJECT A TOTAL OF 100 UNITS UNDER THE SKIN EVERY DAY VIA INSULIN PUMP; CALL  TO SCHEDULE APPT ASAP 03/20/18   Romero Belling, MD  lamoTRIgine (LAMICTAL) 200 MG tablet Take 200 mg by mouth 2 (two) times daily.     [provider]  Lancets (FREESTYLE) lancets Use as instructed dx 250.01 08/10/12   Romero Belling, MD  Multiple Vitamin (MULTIVITAMIN) tablet Take 1 tablet by mouth daily.    [provider]  ondansetron (ZOFRAN) 4 MG tablet TAKE 1 TABLET BY MOUTH EVERY 8 HOURS AS NEEDED FOR NAUSEA AND VOMITING 10/21/17   Romero Belling, MD  rizatriptan (MAXALT) 10 MG tablet Take 1 tablet (10 mg total) by mouth as needed for migraine. May repeat in 2 hours if needed 11/09/16   Romero Belling, MD  rosuvastatin (CRESTOR) 40 MG tablet TAKE 1 TABLET BY MOUTH DAILY GENERIC EQUIVALENT FOR CRESTOR 12/10/18   Olive Bass, FNP  triamcinolone cream (KENALOG) 0.1 % Apply 1 application topically daily. 07/26/17   [name], MD  VIT C-CHOLECALCIFEROL-ROSE HIP PO Take 1 tablet by mouth daily.     [provider]  ziprasidone (GEODON) 60 MG capsule Take 120 mg by mouth at bedtime. Take 2 by mouth at bedtime    [provider]  zolpidem (AMBIEN) 10 MG tablet Take 10 mg by mouth at bedtime. 11/20/15   [provider]    Physical Exam:  Physical Exam Constitutional:      General: She is not in acute distress.    Appearance: She is not diaphoretic.  Eyes:     Conjunctiva/sclera: Conjunctivae normal.     Pupils: Pupils are equal, round, and reactive to light.  Cardiovascular:     Rate and Rhythm: Normal rate and regular rhythm.     Heart sounds: Normal heart sounds. No murmur heard. Pulmonary:     Effort: Pulmonary effort is normal. No respiratory distress.     Breath sounds: Normal breath sounds. No wheezing or rales.  Abdominal:     General: Bowel sounds are normal. There is no distension.     Palpations: Abdomen is soft.     Tenderness: There is no abdominal tenderness. There is no guarding or rebound.  Musculoskeletal:         General: Normal range of motion.     Cervical back: Normal range of motion.     Left knee: Swelling and effusion present. No erythema. Tenderness present.  Lymphadenopathy:     Cervical: No cervical adenopathy.  Skin:    General: Skin is warm and dry.  Neurological:     Mental Status: She is alert and oriented to person, place, and time.    Labs on Admission: I have personally reviewed following labs and imaging studies  CBG: Recent Labs  Lab 11/09/20 1702  GLUCAP 109*    Radiological Exams on Admission: No results found.  EKG: None available  Assessment/Plan Principal Problem:   Septic joint of right knee joint (HCC)  Active Problems:   Type 1 diabetes mellitus (HCC)   Depression   Bipolar 1 disorder (HCC)   HLD (hyperlipidemia)   Right septic knee joint Patient with recent knee aspiration by her orthopedic surgeon which is growing GPCs. Orthopedic surgery requesting IR aspiration for improved fluid analysis and MIR. Orthopedic surgery has requested not to start antibiotics at this time. -Follow-up orthopedic surgery recommendations -IV fluids -CBC, CMP, lactic acid, CRP pending -IR consulted for aspiration; MRI  Nausea/vomiting Present for the past several weeks. Unsure of etiology. Possibly related to above. Poorly controlled with Zofran as an outpatient -Zofran/phenergan prn  Diabetes mellitus, type 1 Hemoglobin A1C of 8.5% from 8/22. Patient is managed on an insulin pump as an outpatient. -Insulin pump per patient  Hyperlipidemia -Continue Crestor  Insomnia -Continue Ambien qHS  Depression Bipolar 1 disorder -Await pharmacy reconciliation prior to prescribing Tegretol, lamotrigine    DVT prophylaxis: Lovenox Code Status: Full code Family Communication: Friend at bedside Disposition Plan: Discharge per orthopedic surgery Consults called: Orthopedic surgery, [facility] Admission status: Inpatient   Jacquelin Hawking, MD [facility] 11/09/2020, 5:20 PM

## 2020-11-09 NOTE — Progress Notes (Signed)
   11/09/20 1700  Assess: MEWS Score  Temp 99.4 F (37.4 C)  BP (!) 155/66  Pulse Rate (!) 114  Resp (!) 22  Level of Consciousness Alert  SpO2 100 %  O2 Device Room Air  Assess: MEWS Score  MEWS Temp 0  MEWS Systolic 0  MEWS Pulse 2  MEWS RR 1  MEWS LOC 0  MEWS Score 3  MEWS Score Color Yellow  Assess: if the MEWS score is Yellow or Red  Were vital signs taken at a resting state? Yes  Does the patient meet 2 or more of the SIRS criteria? Yes  Does the patient have a confirmed or suspected source of infection? Yes  Provider and Rapid Response Notified? Yes  MEWS guidelines implemented *See Row Information* Yes  Treat  MEWS Interventions Administered prn meds/treatments  Pain Scale 0-10  Pain Score 8  Pain Type Acute pain  Pain Location Knee  Pain Orientation Right  Pain Descriptors / Indicators Aching;Grimacing;Guarding  Pain Frequency Intermittent  Pain Onset On-going  Patients Stated Pain Goal 2  Pain Intervention(s) Repositioned  Take Vital Signs  Increase Vital Sign Frequency  Yellow: Q 2hr X 2 then Q 4hr X 2, if remains yellow, continue Q 4hrs  Escalate  MEWS: Escalate Yellow: discuss with charge nurse/RN and consider discussing with provider and RRT  Notify: Charge Nurse/RN  Name of Charge Nurse/RN Notified Britta Mccreedy RN  Date Charge Nurse/RN Notified 11/09/20  Time Charge Nurse/RN Notified 1730  Notify: Provider  Provider Name/Title Dr. Caleb Popp  Date Provider Notified 11/09/20  Time Provider Notified 1700  Notification Type Page  Notification Reason Change in status  Provider response At bedside  Date of Provider Response 11/09/20  Time of Provider Response 1755  Notify: Rapid Response  Name of Rapid Response RN Notified Amber RN  Date Rapid Response Notified 11/09/20  Time Rapid Response Notified 1700  Document  Patient Outcome Other (Comment) (Continuing to monitor with interventions)  Progress note created (see row info) Yes  Assess: SIRS CRITERIA   SIRS Temperature  0  SIRS Pulse 1  SIRS Respirations  1  SIRS WBC 0  SIRS Score Sum  2  Patient uncomfortable with right knee pain, nausea, persistent vomiting per patient. Will provide PRN medications for pain and nausea. Will continue to monitor.

## 2020-11-10 ENCOUNTER — Inpatient Hospital Stay (HOSPITAL_COMMUNITY): Payer: Managed Care, Other (non HMO)

## 2020-11-10 DIAGNOSIS — M00061 Staphylococcal arthritis, right knee: Secondary | ICD-10-CM | POA: Diagnosis not present

## 2020-11-10 DIAGNOSIS — F319 Bipolar disorder, unspecified: Secondary | ICD-10-CM

## 2020-11-10 DIAGNOSIS — E876 Hypokalemia: Secondary | ICD-10-CM

## 2020-11-10 DIAGNOSIS — E1069 Type 1 diabetes mellitus with other specified complication: Secondary | ICD-10-CM | POA: Diagnosis not present

## 2020-11-10 DIAGNOSIS — F32A Depression, unspecified: Secondary | ICD-10-CM | POA: Diagnosis not present

## 2020-11-10 DIAGNOSIS — M009 Pyogenic arthritis, unspecified: Secondary | ICD-10-CM | POA: Diagnosis not present

## 2020-11-10 DIAGNOSIS — M25561 Pain in right knee: Secondary | ICD-10-CM | POA: Diagnosis not present

## 2020-11-10 DIAGNOSIS — M8619 Other acute osteomyelitis, multiple sites: Secondary | ICD-10-CM

## 2020-11-10 DIAGNOSIS — E43 Unspecified severe protein-calorie malnutrition: Secondary | ICD-10-CM

## 2020-11-10 LAB — SYNOVIAL CELL COUNT + DIFF, W/ CRYSTALS
Crystals, Fluid: NONE SEEN
Eosinophils-Synovial: 0 % (ref 0–1)
Lymphocytes-Synovial Fld: 0 % (ref 0–20)
Monocyte-Macrophage-Synovial Fluid: 3 % — ABNORMAL LOW (ref 50–90)
Neutrophil, Synovial: 97 % — ABNORMAL HIGH (ref 0–25)
WBC, Synovial: UNDETERMINED /mm3 (ref 0–200)

## 2020-11-10 LAB — GLUCOSE, CAPILLARY
Glucose-Capillary: 105 mg/dL — ABNORMAL HIGH (ref 70–99)
Glucose-Capillary: 193 mg/dL — ABNORMAL HIGH (ref 70–99)
Glucose-Capillary: 286 mg/dL — ABNORMAL HIGH (ref 70–99)
Glucose-Capillary: 297 mg/dL — ABNORMAL HIGH (ref 70–99)
Glucose-Capillary: 135 mg/dL — ABNORMAL HIGH (ref 70–99)

## 2020-11-10 LAB — CBC
HCT: 31.6 % — ABNORMAL LOW (ref 36.0–46.0)
Hemoglobin: 9.6 g/dL — ABNORMAL LOW (ref 12.0–15.0)
MCH: 26.3 pg (ref 26.0–34.0)
MCHC: 30.4 g/dL (ref 30.0–36.0)
MCV: 86.6 fL (ref 80.0–100.0)
Platelets: 270 10*3/uL (ref 150–400)
RBC: 3.65 MIL/uL — ABNORMAL LOW (ref 3.87–5.11)
RDW: 15.9 % — ABNORMAL HIGH (ref 11.5–15.5)
WBC: 6.7 10*3/uL (ref 4.0–10.5)
nRBC: 0 % (ref 0.0–0.2)

## 2020-11-10 LAB — HIV ANTIBODY (ROUTINE TESTING W REFLEX): HIV Screen 4th Generation wRfx: NONREACTIVE

## 2020-11-10 LAB — COMPREHENSIVE METABOLIC PANEL
ALT: 10 U/L (ref 0–44)
AST: 12 U/L — ABNORMAL LOW (ref 15–41)
Albumin: 2.8 g/dL — ABNORMAL LOW (ref 3.5–5.0)
Alkaline Phosphatase: 75 U/L (ref 38–126)
Anion gap: 7 (ref 5–15)
BUN: 10 mg/dL (ref 6–20)
CO2: 25 mmol/L (ref 22–32)
Calcium: 8.6 mg/dL — ABNORMAL LOW (ref 8.9–10.3)
Chloride: 102 mmol/L (ref 98–111)
Creatinine, Ser: 0.53 mg/dL (ref 0.44–1.00)
GFR, Estimated: >60 mL/min (ref >60)
Glucose, Bld: 117 mg/dL — ABNORMAL HIGH (ref 70–99)
Potassium: 3.5 mmol/L (ref 3.5–5.1)
Sodium: 134 mmol/L — ABNORMAL LOW (ref 135–145)
Total Bilirubin: 0.5 mg/dL (ref 0.3–1.2)
Total Protein: 6.8 g/dL (ref 6.5–8.1)

## 2020-11-10 LAB — HEMOGLOBIN A1C
Hgb A1c MFr Bld: 6.5 % — ABNORMAL HIGH (ref 4.8–5.6)
Mean Plasma Glucose: 139.85 mg/dL

## 2020-11-10 MED ORDER — INSULIN ASPART 100 UNIT/ML IJ SOLN
5.0000 [IU] | Freq: Three times a day (TID) | INTRAMUSCULAR | Status: DC
  Administered 2020-11-10 – 2020-11-19 (×19): 5 [IU] via SUBCUTANEOUS

## 2020-11-10 MED ORDER — INSULIN ASPART 100 UNIT/ML IJ SOLN
0.0000 [IU] | Freq: Three times a day (TID) | INTRAMUSCULAR | Status: DC
  Administered 2020-11-10 (×2): 8 [IU] via SUBCUTANEOUS
  Administered 2020-11-11: 11 [IU] via SUBCUTANEOUS
  Administered 2020-11-11: 5 [IU] via SUBCUTANEOUS

## 2020-11-10 MED ORDER — INSULIN GLARGINE-YFGN 100 UNIT/ML ~~LOC~~ SOLN
25.0000 [IU] | Freq: Every day | SUBCUTANEOUS | Status: DC
  Administered 2020-11-10 – 2020-11-19 (×10): 25 [IU] via SUBCUTANEOUS
  Filled 2020-11-10 (×10): qty 0.25

## 2020-11-10 MED ORDER — LAMOTRIGINE 100 MG PO TABS
400.0000 mg | ORAL_TABLET | Freq: Every day | ORAL | Status: DC
  Administered 2020-11-10 – 2020-11-19 (×10): 400 mg via ORAL
  Filled 2020-11-10 (×10): qty 4

## 2020-11-10 MED ORDER — CARBAMAZEPINE 200 MG PO TABS
1200.0000 mg | ORAL_TABLET | Freq: Every day | ORAL | Status: DC
  Administered 2020-11-10 – 2020-11-12 (×3): 1200 mg via ORAL
  Filled 2020-11-10 (×6): qty 6

## 2020-11-10 MED ORDER — LITHIUM CARBONATE ER 300 MG PO TBCR
300.0000 mg | EXTENDED_RELEASE_TABLET | Freq: Every evening | ORAL | Status: DC
  Administered 2020-11-10 – 2020-11-18 (×8): 300 mg via ORAL
  Filled 2020-11-10 (×10): qty 1

## 2020-11-10 MED ORDER — INSULIN ASPART 100 UNIT/ML IJ SOLN: 0.0000 [IU] | Freq: Three times a day (TID) | INTRAMUSCULAR | Status: DC

## 2020-11-10 MED ORDER — INSULIN ASPART 100 UNIT/ML IJ SOLN: 0.0000 [IU] | Freq: Every day | INTRAMUSCULAR | Status: DC

## 2020-11-10 MED ORDER — INSULIN ASPART 100 UNIT/ML IJ SOLN: 5.0000 [IU] | Freq: Three times a day (TID) | INTRAMUSCULAR | Status: DC

## 2020-11-10 MED ORDER — PROCHLORPERAZINE EDISYLATE 10 MG/2ML IJ SOLN
5.0000 mg | Freq: Four times a day (QID) | INTRAMUSCULAR | Status: DC | PRN
  Administered 2020-11-10: 5 mg via INTRAVENOUS
  Filled 2020-11-10: qty 2

## 2020-11-10 MED ORDER — CEFAZOLIN SODIUM-DEXTROSE 2-4 GM/100ML-% IV SOLN
2.0000 g | Freq: Three times a day (TID) | INTRAVENOUS | Status: DC
  Administered 2020-11-10 – 2020-11-19 (×26): 2 g via INTRAVENOUS
  Filled 2020-11-10 (×28): qty 100

## 2020-11-10 MED ORDER — LIDOCAINE HCL 1 % IJ SOLN
INTRAMUSCULAR | Status: AC
  Administered 2020-11-10: 3 mL
  Filled 2020-11-10: qty 20

## 2020-11-10 MED ORDER — PROCHLORPERAZINE EDISYLATE 10 MG/2ML IJ SOLN
5.0000 mg | Freq: Four times a day (QID) | INTRAMUSCULAR | Status: DC | PRN
  Administered 2020-11-11: 5 mg via INTRAVENOUS
  Filled 2020-11-10: qty 2

## 2020-11-10 NOTE — Procedures (Signed)
PROCEDURE: Right Knee joint aspiration  HISTORY: Right knee pain, small effusion  DIAGNOSIS: Right knee pain  POST PROCEDURAL DIAGNOSIS: Same  SPECIMEN: yes  ANESTHESIA: Local  COMPLICATIONS: none immediate  PROCEDURE: Using ultrasound, the right knee was examined to determine location of fluid collection.  Site was marked.  The site was prepped in usual sterile fashion and approximately 3cc 1% Lidocaine was used as local anesthesia.  Thereafter, a spinal needle was guided into the suprapatellar joint space and 1cc of purulent and bloody synovial fluid was acquired.  The needle was removed and site bandaged.   No complications.  Patient left department in stable condition.  FINDINGS: 1cc of purulent and bloody synovial fluid.  IMPRESSION: Technically successful right knee joint aspiration with 1cc specimen acquired and sent for appropriate labs

## 2020-11-10 NOTE — Progress Notes (Addendum)
PROGRESS NOTE    Molly [name]  GYI:[id number] DOB: 10-25-1970 DOA: 11/09/2020 PCP: Pcp, No   Brief Narrative: Molly [name] is a 50 y.o.  female with medical history significant of diabetes mellitus type 1, bipolar disorder, hyperlipidemia, insomnia. Patient presented secondary to a right knee septic arthritis with evidence of associated osteomyelitis on MRI. Orthopedic surgery on board with plan for eventual surgical management.   Assessment & Plan:   Principal Problem:   Septic joint of right knee joint (HCC) Active Problems:   Type 1 diabetes mellitus (HCC)   Depression   Bipolar 1 disorder (HCC)   HLD (hyperlipidemia)   Right septic knee joint Right distal femur/proximal tibia/patella acute osteomyelitis Patient with recent knee aspiration by her orthopedic surgeon which is growing GPCs. IR consulted for aspiration yielding 1 cc of purulent fluid. MRI of right knee confirms septic arthritis in addition to osteomyelitis. CRP of 5.4. No associated leukocytosis. -Orthopedic surgery recommendations: discussed on the phone; plan for Dr. Linna Caprice to see patient with plan for extensive surgical debridement.  -Obtain blood cultures, follow-up fluid culture -Infectious disease consult   Nausea/vomiting Present for the past several weeks. Unsure of etiology. Possibly related to above. Poorly controlled with Zofran as an outpatient -Compazine prn   Diabetes mellitus, type 1 Hemoglobin A1C of 8.5% from 8/22. Patient initially requested to manage insulin with her pump but now her pump is not working. Novolog basal rate of 1.2 units per hour via pump. -Start Semglee 25 units daily and Novolog 5 units TID with meals -SSI qAC/HS  Anemia Unsure if acute or chronic. No history of bleeding. Recent outside records suggesting stable hemoglobin from earlier this month -Iron, TIBC, Ferritin   Hyperlipidemia -Continue Crestor  Hypokalemia Potassium of 2.4 on admission. Resolved  with potassium supplementation   Insomnia -Continue Ambien qHS   Depression Bipolar 1 disorder -Continue Tegretol, lamotrigine, lithium (doses verified with prescriber per pharmacy)   DVT prophylaxis: Lovenox Code Status:   Code Status: Full Code Family Communication: None at bedside Disposition Plan: Discharge per orthopedic surgery recommendations/management   Consultants:  Orthopedic surgery Interventional radiology Infectious disease  Procedures:  RIGHT KNEE ASPIRATION (11/10/2020)  Antimicrobials: None    Subjective: Nausea is better with compazine. Knee pain is still present. Insulin pump is no longer working.  Objective: Vitals:   11/10/20 0055 11/10/20 0454 11/10/20 0800 11/10/20 1217  BP: (!) 150/74 (!) 146/74  (!) 159/73  Pulse: (!) 105 98  (!) 104  Resp: 18 18    Temp: 98.4 F (36.9 C) 97.9 F (36.6 C)  98.9 F (37.2 C)  TempSrc: Oral Oral  Oral  SpO2: 100% 100%  99%  Weight:   66.3 kg   Height:   5\' 7"  (1.702 m)     Intake/Output Summary (Last 24 hours) at 11/10/2020 1229 Last data filed at 11/10/2020 0900 Gross per 24 hour  Intake 560 ml  Output --  Net 560 ml   Filed Weights   11/10/20 0800  Weight: 66.3 kg    Examination:  General exam: Appears calm and comfortable Respiratory system: Clear to auscultation. Respiratory effort normal. Cardiovascular system: S1 & S2 heard, RRR. No murmurs, rubs, gallops or clicks. Gastrointestinal system: Abdomen is nondistended, soft and nontender. No organomegaly or masses felt. Normal bowel sounds heard. Central nervous system: Alert and oriented. No focal neurological deficits. Musculoskeletal: right knee with minimal erythema but is swollen, tender and slightly warm Skin: No cyanosis. No rashes Psychiatry: Judgement and  insight appear normal. Mood & affect appropriate.     Data Reviewed: I have personally reviewed following labs and imaging studies  CBC Lab Results  Component Value Date    WBC 6.7 11/10/2020   RBC 3.65 (L) 11/10/2020   HGB 9.6 (L) 11/10/2020   HCT 31.6 (L) 11/10/2020   MCV 86.6 11/10/2020   MCH 26.3 11/10/2020   PLT 270 11/10/2020   MCHC 30.4 11/10/2020   RDW 15.9 (H) 11/10/2020   LYMPHSABS 0.9 11/09/2020   MONOABS 0.7 11/09/2020   EOSABS 0.0 11/09/2020   BASOSABS 0.0 11/09/2020     Last metabolic panel Lab Results  Component Value Date   NA 134 (L) 11/10/2020   K 3.5 11/10/2020   CL 102 11/10/2020   CO2 25 11/10/2020   BUN 10 11/10/2020   CREATININE 0.53 11/10/2020   GLUCOSE 117 (H) 11/10/2020   GFRNONAA >60 11/10/2020   GFRAA >60 11/23/2015   CALCIUM 8.6 (L) 11/10/2020   PHOS 2.1 (L) 11/28/2009   PROT 6.8 11/10/2020   ALBUMIN 2.8 (L) 11/10/2020   BILITOT 0.5 11/10/2020   ALKPHOS 75 11/10/2020   AST 12 (L) 11/10/2020   ALT 10 11/10/2020   ANIONGAP 7 11/10/2020    CBG (last 3)  Recent Labs    11/10/20 0402 11/10/20 0814 11/10/20 1218  GLUCAP 105* 193* 286*     GFR: Estimated Creatinine Clearance: 80.9 mL/min (by C-G formula based on SCr of 0.53 mg/dL).  Coagulation Profile: No results for input(s): INR, PROTIME in the last 168 hours.  Recent Results (from the past 240 hour(s))  Resp Panel by RT-PCR (Flu A&B, Covid) Nasopharyngeal Swab     Status: None   Collection Time: 11/09/20  9:30 PM   Specimen: Nasopharyngeal Swab; Nasopharyngeal(NP) swabs in vial transport medium  Result Value Ref Range Status   SARS Coronavirus 2 by RT PCR NEGATIVE NEGATIVE Final    Comment: (NOTE) SARS-CoV-2 target nucleic acids are NOT DETECTED.  The SARS-CoV-2 RNA is generally detectable in upper respiratory specimens during the acute phase of infection. The lowest concentration of SARS-CoV-2 viral copies this assay can detect is 138 copies/mL. A negative result does not preclude SARS-Cov-2 infection and should not be used as the sole basis for treatment or other patient management decisions. A negative result may occur with  improper  specimen collection/handling, submission of specimen other than nasopharyngeal swab, presence of viral mutation(s) within the areas targeted by this assay, and inadequate number of viral copies(<138 copies/mL). A negative result must be combined with clinical observations, patient history, and epidemiological information. The expected result is Negative.  Fact Sheet for Patients:  BloggerCourse.com  Fact Sheet for Healthcare Providers:  SeriousBroker.it  This test is no t yet approved or cleared by the [place] FDA and  has been authorized for detection and/or diagnosis of SARS-CoV-2 by FDA under an Emergency Use Authorization (EUA). This EUA will remain  in effect (meaning this test can be used) for the duration of the COVID-19 declaration under Section 564(b)(1) of the Act, 21 U.S.C.section 360bbb-3(b)(1), unless the authorization is terminated  or revoked sooner.       Influenza A by PCR NEGATIVE NEGATIVE Final   Influenza B by PCR NEGATIVE NEGATIVE Final    Comment: (NOTE) The Xpert Xpress SARS-CoV-2/FLU/RSV plus assay is intended as an aid in the diagnosis of influenza from Nasopharyngeal swab specimens and should not be used as a sole basis for treatment. Nasal washings and aspirates are unacceptable for Xpert  Xpress SARS-CoV-2/FLU/RSV testing.  Fact Sheet for Patients: BloggerCourse.com  Fact Sheet for Healthcare Providers: SeriousBroker.it  This test is not yet approved or cleared by the [place] FDA and has been authorized for detection and/or diagnosis of SARS-CoV-2 by FDA under an Emergency Use Authorization (EUA). This EUA will remain in effect (meaning this test can be used) for the duration of the COVID-19 declaration under Section 564(b)(1) of the Act, 21 U.S.C. section 360bbb-3(b)(1), unless the authorization is terminated or revoked.  Performed at  [facility], [address], [place], [place] [address]         Radiology Studies: MR KNEE RIGHT W WO CONTRAST  Result Date: 11/10/2020 CLINICAL DATA:  Septic arthritis suspected, knee, no prior imaging EXAM: MRI OF THE RIGHT KNEE WITHOUT AND WITH CONTRAST TECHNIQUE: Multiplanar, multisequence MR imaging of the right knee was performed both before and after administration of intravenous contrast. CONTRAST:  36mL GADAVIST GADOBUTROL 1 MMOL/ML IV SOLN COMPARISON:  None. FINDINGS: Technical Note: Despite efforts by the technologist and patient, motion artifact is present on today's exam and could not be eliminated. This reduces exam sensitivity and specificity. Bones/Joint/Cartilage Marked bone marrow edema throughout the distal femur with involvement of the medial and lateral femoral condyles extending to the level of the distal metaphysis. There is also marked bone marrow edema and enhancement of the proximal tibia extending to the level of the proximal metaphysis. Confluent low T1 signal changes throughout the distal femur and proximal tibia are compatible with acute osteomyelitis. Erosive changes along the articular surfaces of both bones with complete joint space collapse. There is posterior subluxation of the tibia relative to the distal femur. Fragmented bony densities along the anteromedial aspect of the proximal tibia. Marked bone marrow edema throughout the patella with erosive changes along the lateral patellar facet. Mild subchondral marrow edema within the fibular head without confluent low T1 signal changes. Small complex knee joint effusion with enhancing synovitis. Ligaments Nonvisualization of the ACL, likely torn. PCL is stretch with horizontal orientation. Collateral ligaments are poorly defined. Muscles and Tendons Intramuscular edema within the visualized distal thigh and proximal calf musculature. No intramuscular fluid collection is evident. Extensor mechanism  remains intact. Soft tissues Diffuse periarticular soft tissue edema. No organized or rim enhancing extra-articular fluid collections. IMPRESSION: 1. Right knee septic arthritis with extensive changes of acute osteomyelitis involving the distal femur, proximal tibia, and patella. 2. Intramuscular edema within the visualized distal thigh and proximal calf musculature, suggesting myositis. No organized or rim enhancing extra-articular fluid collections. These results will be called to the ordering clinician or representative by the Radiologist Assistant, and communication documented in the PACS or [employer]. Electronically Signed   By: Duanne Guess D.O.   On: 11/10/2020 08:24   IR DRAIN/INJ MAJOR JOINT/BURSA  Result Date: 11/10/2020 Sheliah Plane, [place]     11/10/2020 12:03 PM PROCEDURE: Right Knee joint aspiration HISTORY: Right knee pain, small effusion DIAGNOSIS: Right knee pain POST PROCEDURAL DIAGNOSIS: Same SPECIMEN: yes ANESTHESIA: Local COMPLICATIONS: none immediate PROCEDURE: Using ultrasound, the right knee was examined to determine location of fluid collection.  Site was marked. The site was prepped in usual sterile fashion and approximately 3cc 1% Lidocaine was used as local anesthesia.  Thereafter, a spinal needle was guided into the suprapatellar joint space and 1cc of purulent and bloody synovial fluid was acquired.  The needle was removed and site bandaged.  No complications.  Patient left department in stable condition. FINDINGS: 1cc of purulent  and bloody synovial fluid. IMPRESSION: Technically successful right knee joint aspiration with 1cc specimen acquired and sent for appropriate labs         Scheduled Meds:  carbamazepine  1,200 mg Oral QHS   carbamazepine  200 mg Oral q morning   enoxaparin (LOVENOX) injection  40 mg Subcutaneous Q24H   insulin pump   Subcutaneous TID WC, HS, 0200   lamoTRIgine  400 mg Oral Daily   lithium carbonate  300 mg Oral QPM   rosuvastatin   40 mg Oral Daily   ziprasidone  120 mg Oral QHS   zolpidem  5 mg Oral QHS   Continuous Infusions:  sodium chloride 75 mL/hr at 11/09/20 1933     LOS: 1 day     Jacquelin Hawking, MD [facility] 11/10/2020, 12:29 PM  If 7PM-7AM, please contact night-coverage [email or web address]

## 2020-11-10 NOTE — Consult Note (Signed)
ORTHOPAEDIC CONSULTATION   Chief Complaint: right knee pain and swelling  HPI: Molly [name] is a 50 y.o. female who complains of right knee pain and swelling since July with no inciting injury or event. Please see prior note from 11/09/20 for more details.   No history of MI, CVA, DVT, PE. Has had 2 negative right leg doppler scans recently due to this issue and an elevated D-dimer.  Previously ambulatory without the use of assistive devices prior to July when symptoms began. Now is reliant on a wheelchair for mobility and is mainly laying in bed all day at home.   Past Medical History:  Diagnosis Date   ALLERGIC RHINITIS CAUSE UNSPECIFIED 03/06/2007   Bipolar 1 disorder (HCC)    CONTUSION, ANKLE 08/28/2007   DEPRESSION 08/03/2006   DIABETES MELLITUS, TYPE I 08/03/2006   DM retinopathy (HCC)    Encounter for long-term (current) use of other medications    Hyperlipidemia    MIGRAINE, CHRONIC 03/06/2007   Osteoporosis    SEBACEOUS CYST, INFECTED 07/13/2007   SMOKER 08/11/2009   THROMBOCYTOPENIA 05/07/2008   TINEA VERSICOLOR 07/13/2007   Past Surgical History:  Procedure Laterality Date   Abnormal U/S  12/10/1996   ELECTROCARDIOGRAM  06/13/2006   TUBAL LIGATION     Social History   Socioeconomic History   Marital status: Widowed    Spouse name: Not on file   Number of children: Not on file   Years of education: Not on file   Highest education level: Not on file  Occupational History   Not on file  Tobacco Use   Smoking status: Former    Types: Cigarettes    Quit date: 10/16/2010    Years since quitting: 10.0   Smokeless tobacco: Never  Vaping Use   Vaping Use: Never used  Substance and Sexual Activity   Alcohol use: No   Drug use: No   Sexual activity: Not Currently    Comment: tubal ligation, intercourse unknown , 5 seuxal partners,des neg  Other Topics Concern   Not on file  Social History Narrative   Does not work outside the home   Social Determinants  of Health   Financial Resource Strain: Not on file  Food Insecurity: Not on file  Transportation Needs: Not on file  Physical Activity: Not on file  Stress: Not on file  Social Connections: Not on file   Family History  Problem Relation Age of Onset   Diabetes Father        (Oral Agents)   Diabetes Paternal Grandmother    Hypertension Paternal Grandmother    Diabetes Paternal Grandfather    Cancer Paternal Grandfather        colon   Hypertension Paternal Grandfather    Allergies  Allergen Reactions   Varenicline Tartrate     REACTION: rash   Prior to Admission medications   Medication Sig Start Date End Date Taking? Authorizing Provider  amLODipine (NORVASC) 5 MG tablet Take 5 mg by mouth daily.   Yes [provider]  Calcium Carbonate (CALCIUM 600 PO) Take 1 tablet by mouth 2 (two) times daily.    Yes [provider]  carbamazepine (TEGRETOL) 200 MG tablet Take 2-4 tablets (400-800 mg total) by mouth 2 (two) times daily. 5 in the morning/ 1 in the evening Patient taking differently: No sig reported 01/02/18  Yes Olive Bass, FNP  cetirizine-pseudoephedrine (ZYRTEC-D) 5-120 MG tablet Take 1 tablet by mouth daily. 03/22/17  Yes  Romero Belling, MD  cyclobenzaprine (FLEXERIL) 10 MG tablet Take 20 mg by mouth at bedtime.   Yes [provider]  FLOVENT DISKUS 50 MCG/BLIST diskus inhaler INHALE 1 PUFF BY MOUTH INTO THE LUNGS 2 TIMES DAILY Patient taking differently: Inhale 1 puff into the lungs 2 (two) times daily. 12/07/18  Yes Olive Bass, FNP  KLOR-CON M20 20 MEQ tablet Take 20 mEq by mouth daily. 11/02/20  Yes [provider]  lamoTRIgine (LAMICTAL) 200 MG tablet Take 200 mg by mouth 2 (two) times daily.    Yes [provider]  lithium 300 MG tablet Take 300 mg by mouth every evening.   Yes [provider]  LYUMJEV 100 UNIT/ML SOLN Inject 150 Units into the skin See admin instructions. Insulin pump 11/04/20   Yes [provider]  Multiple Vitamin (MULTIVITAMIN) tablet Take 1 tablet by mouth daily.   Yes [provider]  ondansetron (ZOFRAN) 4 MG tablet TAKE 1 TABLET BY MOUTH EVERY 8 HOURS AS NEEDED FOR NAUSEA AND VOMITING Patient taking differently: Take 4 mg by mouth every 8 (eight) hours as needed for nausea or vomiting. 10/21/17  Yes Romero Belling, MD  rosuvastatin (CRESTOR) 40 MG tablet TAKE 1 TABLET BY MOUTH DAILY GENERIC EQUIVALENT FOR CRESTOR Patient taking differently: Take 40 mg by mouth daily. 12/10/18  Yes Olive Bass, FNP  Sennosides 25 MG TABS Take 1 tablet by mouth daily.   Yes [provider]  ziprasidone (GEODON) 60 MG capsule Take 120 mg by mouth at bedtime. Take 2 by mouth at bedtime   Yes [provider]  zolpidem (AMBIEN) 10 MG tablet Take 20 mg by mouth at bedtime. 11/20/15  Yes [provider]  Alcohol Swabs (ALCOHOL PREP) 70 % PADS 8/day  250.01 10/27/11   Romero Belling, MD  alendronate (FOSAMAX) 70 MG tablet TAKE 1 TABLET BY MOUTH EVERY 7 DAYS. TAKE WITH FULL GLASS OF WATER ON AN EMPTY STOMACH Patient not taking: No sig reported 08/01/16   Romero Belling, MD  cetirizine-pseudoephedrine (ZYRTEC-D) 5-120 MG tablet Take 1 tablet by mouth 2 (two) times daily. Patient not taking: No sig reported 04/27/18   Olive Bass, FNP  glucose blood (FREESTYLE LITE) test strip 8/day dx 250.01, and lancets 06/01/15   Romero Belling, MD  Insulin Infusion Pump Supplies (MINIMED INFUSION SET-MMT 398) MISC Inject 1 Device into the skin 3 days. Change every 3 days Patient not taking: Reported on 11/09/2020 06/01/15   Romero Belling, MD  Insulin Infusion Pump Supplies (PARADIGM RESERVOIR ) MISC Inject 1 Device into the skin 3 days. Change every 3 days 06/01/15   Romero Belling, MD  insulin regular (NOVOLIN R) 100 units/mL injection INJECT A TOTAL OF 100 UNITS UNDER THE SKIN EVERY DAY VIA INSULIN PUMP; CALL TO SCHEDULE APPT ASAP Patient not taking:  Reported on 11/09/2020 03/20/18   Romero Belling, MD  Lancets (FREESTYLE) lancets Use as instructed dx 250.01 08/10/12   Romero Belling, MD  rizatriptan (MAXALT) 10 MG tablet Take 1 tablet (10 mg total) by mouth as needed for migraine. May repeat in 2 hours if needed Patient not taking: No sig reported 11/09/16   Romero Belling, MD  triamcinolone cream (KENALOG) 0.1 % Apply 1 application topically daily. Patient not taking: No sig reported 07/26/17   [name], MD   MR KNEE RIGHT W WO CONTRAST  Result Date: 11/10/2020 CLINICAL DATA:  Septic arthritis suspected, knee, no prior imaging EXAM: MRI OF THE RIGHT KNEE WITHOUT AND  WITH CONTRAST TECHNIQUE: Multiplanar, multisequence MR imaging of the right knee was performed both before and after administration of intravenous contrast. CONTRAST:  59mL GADAVIST GADOBUTROL 1 MMOL/ML IV SOLN COMPARISON:  None. FINDINGS: Technical Note: Despite efforts by the technologist and patient, motion artifact is present on today's exam and could not be eliminated. This reduces exam sensitivity and specificity. Bones/Joint/Cartilage Marked bone marrow edema throughout the distal femur with involvement of the medial and lateral femoral condyles extending to the level of the distal metaphysis. There is also marked bone marrow edema and enhancement of the proximal tibia extending to the level of the proximal metaphysis. Confluent low T1 signal changes throughout the distal femur and proximal tibia are compatible with acute osteomyelitis. Erosive changes along the articular surfaces of both bones with complete joint space collapse. There is posterior subluxation of the tibia relative to the distal femur. Fragmented bony densities along the anteromedial aspect of the proximal tibia. Marked bone marrow edema throughout the patella with erosive changes along the lateral patellar facet. Mild subchondral marrow edema within the fibular head without confluent low T1 signal changes.  Small complex knee joint effusion with enhancing synovitis. Ligaments Nonvisualization of the ACL, likely torn. PCL is stretch with horizontal orientation. Collateral ligaments are poorly defined. Muscles and Tendons Intramuscular edema within the visualized distal thigh and proximal calf musculature. No intramuscular fluid collection is evident. Extensor mechanism remains intact. Soft tissues Diffuse periarticular soft tissue edema. No organized or rim enhancing extra-articular fluid collections. IMPRESSION: 1. Right knee septic arthritis with extensive changes of acute osteomyelitis involving the distal femur, proximal tibia, and patella. 2. Intramuscular edema within the visualized distal thigh and proximal calf musculature, suggesting myositis. No organized or rim enhancing extra-articular fluid collections. These results will be called to the ordering clinician or representative by the Radiologist Assistant, and communication documented in the PACS or [employer]. Electronically Signed   By: Duanne Guess D.O.   On: 11/10/2020 08:24   IR DRAIN/INJ MAJOR JOINT/BURSA  Result Date: 11/10/2020 Sheliah Plane, [place]     11/10/2020 12:03 PM PROCEDURE: Right Knee joint aspiration HISTORY: Right knee pain, small effusion DIAGNOSIS: Right knee pain POST PROCEDURAL DIAGNOSIS: Same SPECIMEN: yes ANESTHESIA: Local COMPLICATIONS: none immediate PROCEDURE: Using ultrasound, the right knee was examined to determine location of fluid collection.  Site was marked. The site was prepped in usual sterile fashion and approximately 3cc 1% Lidocaine was used as local anesthesia.  Thereafter, a spinal needle was guided into the suprapatellar joint space and 1cc of purulent and bloody synovial fluid was acquired.  The needle was removed and site bandaged.  No complications.  Patient left department in stable condition. FINDINGS: 1cc of purulent and bloody synovial fluid. IMPRESSION: Technically successful right knee joint  aspiration with 1cc specimen acquired and sent for appropriate labs     Positive ROS: All other systems have been reviewed and were otherwise negative with the exception of those mentioned in the HPI and as above.  Objective: Labs cbc Recent Labs    11/09/20 1751 11/10/20 0439  WBC 9.0 6.7  HGB 10.8* 9.6*  HCT 34.0* 31.6*  PLT 391 270    Labs inflam Recent Labs    11/09/20 1751  CRP 5.4*    Labs coag No results for input(s): INR, PTT in the last 72 hours.  Invalid input(s): PT  Recent Labs    11/09/20 1751 11/10/20 0439  NA 134* 134*  K 2.4* 3.5  CL 99 102  CO2  23 25  GLUCOSE 118* 117*  BUN 12 10  CREATININE 0.52 0.53  CALCIUM 9.1 8.6*    Physical Exam: Vitals:   11/10/20 0454 11/10/20 1217  BP: (!) 146/74 (!) 159/73  Pulse: 98 (!) 104  Resp: 18   Temp: 97.9 F (36.6 C) 98.9 F (37.2 C)  SpO2: 100% 99%   General: Alert, no acute distress.  Mental status: Alert and Oriented x3 Neurologic: Speech Clear and organized, no gross focal findings or movement disorder appreciated. Respiratory: No cyanosis, no use of accessory musculature Cardiovascular: No pedal edema GI: Abdomen is soft and non-tender, non-distended. Skin: Warm and dry. Erythema decreasing Extremities: Warm and well perfused  Psychiatric: Patient is competent for consent with normal mood and affect  MUSCULOSKELETAL:  Right knee very TTP, edematous, very limited ROM, warm to the touch, erythema to anterior skin  Other extremities are atraumatic with painless ROM and NVI.  Assessment / Plan: Principal Problem:   Septic joint of right knee joint (HCC) Active Problems:   Type 1 diabetes mellitus (HCC)   Depression   Bipolar 1 disorder (HCC)   HLD (hyperlipidemia)    Discussed with patient the results of the new right knee MRI. While this again shows a likely torn ACL, there are far worse things going on as well to focus on. I do not think the ACL tear is the cause of the severe pain  she describes having for months. I explained the severity of the findings with extensive soft tissue edema, septic arthritis, and osteomyelitis to the distal femur, proximal tibia, and patella. This is likely a situation that will require a distal femur replacement at the least. Since these cases are beyond our scope of practice, we have reached out to Dr. Linna Caprice who has agreed to take over management of this patient's care. It sounds like he is thinking of trying to do an extensive debridement of the right knee and involved tissues.   Culture from the aspiration we did in the office grew MSSA. Susceptibility testing shows that Clindamycin, Erythromycin, Gentamicin, Oxacillin, Tetracycline, Bactrim, and Vanc are all good options for treatment. Bactrim had the largest ring.   IR was able to aspirate more fluid from the right knee today and that has been sent to the lab for further testing as well.     Weightbearing: WBAT RLE Orthopedic device(s):  can use knee brace PRN, likely will be too painful Pain control: per primary team Follow - up plan:  transferring care to Dr. Cloretta Ned information:  Margarita Rana MD, [facility] PA-C  Jenne Pane PA-C Office [phone] 11/10/2020

## 2020-11-10 NOTE — Progress Notes (Signed)
Pharmacy Brief Note - Medication Dose Clarification:  Patient is on carbamazepine prior to admission, prescribed by Dr. Milagros Evener.   Called Dr. Carie Caddy office this morning to confirm prescribed dose of carbamazepine. Office reported patient is prescribed the following dose: Tablet strength: carbamazepine 200 mg tablet Dose: Take 1 tablet (200 mg) in the morning, 6 tablets (1200 mg) at bedtime.   Total daily dose is consistent with insurance fill history: Filled #630 tablets for a 90 day supply on 08/29/20.  Also confirmed that patient is prescribed the following: Lithium carbonate 300 mg extended release; 1 tablet PO every evening (filled #90 for a 90 day supply on 08/31/20) Lamotrigine 200 mg tablet; 1 tab PO BID (filled #180 for 90 day supply on 08/29/20) Pt reported to MD that she takes both tablets once a day in the morning  Cindi Carbon, PharmD 11/10/20 10:02 AM

## 2020-11-10 NOTE — Progress Notes (Signed)
Pharmacy Antibiotic Note  Molly [name] is a 50 y.o. female admitted on 11/09/2020 with  MSSA septic arthritis with osteomyelitis .  Pharmacy has been consulted for ancef dosing.  Plan: Ancef 2g IV q8  Height: 5\' 7"  (170.2 cm) Weight: 66.3 kg (146 lb 1.6 oz) IBW/kg (Calculated) : 61.6  Temp (24hrs), Avg:98.8 F (37.1 C), Min:97.9 F (36.6 C), Max:99.4 F (37.4 C)  Recent Labs  Lab 11/09/20 1751 11/10/20 0439  WBC 9.0 6.7  CREATININE 0.52 0.53  LATICACIDVEN 1.7  --     Estimated Creatinine Clearance: 80.9 mL/min (by C-G formula based on SCr of 0.53 mg/dL).    Allergies  Allergen Reactions   Varenicline Tartrate     REACTION: rash     Thank you for allowing pharmacy to be a part of this patient's care.  11/12/20 11/10/2020 2:35 PM

## 2020-11-10 NOTE — Consult Note (Signed)
[facility]    Date of Admission:  11/09/2020     Reason for Consult: Septic arthritis     Referring Physician: Dr Lonny Prude  Current antibiotics: None  Previous antibiotics: None  ASSESSMENT:    50 y.o. female admitted with:  Native right knee septic arthritis: Further complicated by MRI findings of distal femur, proximal tibia, and patella osteomyelitis.  Recent knee aspirate cultures done at the orthopedic office growing MSSA.  She is also status post aspiration today with interventional radiology which yielded 1 cc of purulent fluid.  Baseline ESR 94. Type 1 diabetes: A1c is 6.5. Nausea, vomiting: Unclear etiology. Protein calorie malnutrition: Albumin 2.8. Bipolar disorder Hypokalemia  RECOMMENDATIONS:    Will start cefazolin per pharmacy for MSSA septic arthritis and osteomyelitis Orthopedic evaluation pending for surgical management that will be needed Check blood cultures to ensure no bacteremia prior to starting antibiotics Follow-up synovial fluid cultures from today Glycemic control Will follow   Principal Problem:   Septic joint of right knee joint ([place]) Active Problems:   Type 1 diabetes mellitus ([place])   Depression   Bipolar 1 disorder ([facility])   HLD (hyperlipidemia)   MEDICATIONS:    Scheduled Meds: . carbamazepine  1,200 mg Oral QHS  . carbamazepine  200 mg Oral q morning  . enoxaparin (LOVENOX) injection  40 mg Subcutaneous Q24H  . insulin aspart  0-15 Units Subcutaneous TID WC  . insulin aspart  0-5 Units Subcutaneous QHS  . insulin aspart  5 Units Subcutaneous TID WC  . insulin glargine-yfgn  25 Units Subcutaneous Daily  . lamoTRIgine  400 mg Oral Daily  . lithium carbonate  300 mg Oral QPM  . rosuvastatin  40 mg Oral Daily  . ziprasidone  120 mg Oral QHS  . zolpidem  5 mg Oral QHS   Continuous Infusions: . sodium chloride 75 mL/hr at 11/09/20 1933   PRN Meds:.acetaminophen **OR** acetaminophen,  cyclobenzaprine, ondansetron (ZOFRAN) IV **OR** ondansetron, oxyCODONE-acetaminophen, prochlorperazine  HPI:    Molly [name] is a 50 y.o. female with a past medical history significant for uncontrolled type I diabetes, hyperlipidemia, hypertension, and bipolar disorder who presented with right knee septic arthritis and need for further orthopedic surgery evaluation.  She reports approximately 3 months of right knee pain that has been persistent and gradually worsening along with erythema and swelling.  She was initially evaluated by [facility] at which time work-up was remarkable for negative DVT studies.  She had a steroid injection that provided about 2 days of relief but then pain came back and was worsened. She did not have any joint aspiration performed.  She then established care with [facility] and underwent joint aspiration last week in the office.  This was notable for removal of 0.5 cc of bloody fluid of which cultures grew MSSA.  She was subsequently admitted to the hospital and interventional radiology performed another joint aspiration today with removal of 1 cc of purulent and bloody synovial fluid.  She also underwent MRI last night notable for right knee septic arthritis with extensive changes of acute osteomyelitis involving the distal femur, proximal tibia, and patella.  Additionally noted was intramuscular edema within the visualized distal thigh and proximal calf musculature which was suggestive of myositis but no organized fluid collections or abscesses.  She reports that this is her native right knee and she does not recall any trauma, injury, or inciting event prior to the onset of her symptoms.  She  denies any fevers or chills.  She denies injection drug use.  She does endorse 2 to 3 weeks of persistent nausea of unclear etiology.  She works as a Secretary/administrator and prior to the onset of her knee pain was active and able to complete the task associated with her work.   She reports that she did not spend a significant amount of time on her knee at work.   Past Medical History:  Diagnosis Date  . ALLERGIC RHINITIS CAUSE UNSPECIFIED 03/06/2007  . Bipolar 1 disorder ([place])   . CONTUSION, ANKLE 08/28/2007  . DEPRESSION 08/03/2006  . DIABETES MELLITUS, TYPE I 08/03/2006  . DM retinopathy ([place])   . Encounter for long-term (current) use of other medications   . Hyperlipidemia   . MIGRAINE, CHRONIC 03/06/2007  . Osteoporosis   . SEBACEOUS CYST, INFECTED 07/13/2007  . SMOKER 08/11/2009  . THROMBOCYTOPENIA 05/07/2008  . TINEA VERSICOLOR 07/13/2007    Social History   Tobacco Use  . Smoking status: Former    Types: Cigarettes    Quit date: 10/16/2010    Years since quitting: 10.0  . Smokeless tobacco: Never  Vaping Use  . Vaping Use: Never used  Substance Use Topics  . Alcohol use: No  . Drug use: No    Family History  Problem Relation Age of Onset  . Diabetes Father        (Oral Agents)  . Diabetes Paternal Grandmother   . Hypertension Paternal Grandmother   . Diabetes Paternal Grandfather   . Cancer Paternal Grandfather        colon  . Hypertension Paternal Grandfather     Allergies  Allergen Reactions  . Varenicline Tartrate     REACTION: rash    Review of Systems  Constitutional: Negative.   Respiratory: Negative.    Cardiovascular: Negative.   Gastrointestinal:  Positive for nausea and vomiting. Negative for abdominal pain.  Musculoskeletal:  Positive for joint pain.  Skin: Negative.   All other systems reviewed and are negative.  OBJECTIVE:   Blood pressure (!) 159/73, pulse (!) 104, temperature 98.9 F (37.2 C), temperature source Oral, resp. rate 18, height _0  (1.702 m), weight 66.3 kg, SpO2 99 %. Body mass index is 22.88 kg/m.  Physical Exam Constitutional:      General: She is not in acute distress.    Appearance: Normal appearance.  HENT:     Head: Normocephalic and atraumatic.  Eyes:     Extraocular Movements:  Extraocular movements intact.     Conjunctiva/sclera: Conjunctivae normal.  Cardiovascular:     Rate and Rhythm: Normal rate and regular rhythm.     Heart sounds: No murmur heard. Pulmonary:     Effort: Pulmonary effort is normal. No respiratory distress.     Breath sounds: Normal breath sounds.  Abdominal:     General: There is no distension.     Palpations: Abdomen is soft.     Tenderness: There is no abdominal tenderness.  Musculoskeletal:        General: Swelling and tenderness present.     Comments: Right knee is warm, swollen, and tender.   Skin:    General: Skin is warm and dry.     Findings: Erythema present.  Neurological:     General: No focal deficit present.     Mental Status: She is alert and oriented to person, place, and time.     Cranial Nerves: No cranial nerve deficit.  Psychiatric:  Mood and Affect: Mood normal.        Behavior: Behavior normal.     Lab Results: Lab Results  Component Value Date   WBC 6.7 11/10/2020   HGB 9.6 (L) 11/10/2020   HCT 31.6 (L) 11/10/2020   MCV 86.6 11/10/2020   PLT 270 11/10/2020    Lab Results  Component Value Date   NA 134 (L) 11/10/2020   K 3.5 11/10/2020   CO2 25 11/10/2020   GLUCOSE 117 (H) 11/10/2020   BUN 10 11/10/2020   CREATININE 0.53 11/10/2020   CALCIUM 8.6 (L) 11/10/2020   GFRNONAA >60 11/10/2020   GFRAA >60 11/23/2015    Lab Results  Component Value Date   ALT 10 11/10/2020   AST 12 (L) 11/10/2020   ALKPHOS 75 11/10/2020   BILITOT 0.5 11/10/2020       Component Value Date/Time   CRP 5.4 (H) 11/09/2020 1751       Component Value Date/Time   ESRSEDRATE 94 (H) 11/09/2020 1751    I have reviewed the micro and lab results in Epic.  Imaging: MR KNEE RIGHT W WO CONTRAST  Result Date: 11/10/2020 CLINICAL DATA:  Septic arthritis suspected, knee, no prior imaging EXAM: MRI OF THE RIGHT KNEE WITHOUT AND WITH CONTRAST TECHNIQUE: Multiplanar, multisequence MR imaging of the right knee was  performed both before and after administration of intravenous contrast. CONTRAST:  34m GADAVIST GADOBUTROL 1 MMOL/ML IV SOLN COMPARISON:  None. FINDINGS: Technical Note: Despite efforts by the technologist and patient, motion artifact is present on today's exam and could not be eliminated. This reduces exam sensitivity and specificity. Bones/Joint/Cartilage Marked bone marrow edema throughout the distal femur with involvement of the medial and lateral femoral condyles extending to the level of the distal metaphysis. There is also marked bone marrow edema and enhancement of the proximal tibia extending to the level of the proximal metaphysis. Confluent low T1 signal changes throughout the distal femur and proximal tibia are compatible with acute osteomyelitis. Erosive changes along the articular surfaces of both bones with complete joint space collapse. There is posterior subluxation of the tibia relative to the distal femur. Fragmented bony densities along the anteromedial aspect of the proximal tibia. Marked bone marrow edema throughout the patella with erosive changes along the lateral patellar facet. Mild subchondral marrow edema within the fibular head without confluent low T1 signal changes. Small complex knee joint effusion with enhancing synovitis. Ligaments Nonvisualization of the ACL, likely torn. PCL is stretch with horizontal orientation. Collateral ligaments are poorly defined. Muscles and Tendons Intramuscular edema within the visualized distal thigh and proximal calf musculature. No intramuscular fluid collection is evident. Extensor mechanism remains intact. Soft tissues Diffuse periarticular soft tissue edema. No organized or rim enhancing extra-articular fluid collections. IMPRESSION: 1. Right knee septic arthritis with extensive changes of acute osteomyelitis involving the distal femur, proximal tibia, and patella. 2. Intramuscular edema within the visualized distal thigh and proximal calf  musculature, suggesting myositis. No organized or rim enhancing extra-articular fluid collections. These results will be called to the ordering clinician or representative by the Radiologist Assistant, and communication documented in the PACS or [employer] Electronically Signed   By: NDavina PokeD.O.   On: 11/10/2020 08:24   IR DRAIN/INJ MAJOR JOINT/BURSA  Result Date: 11/10/2020 CLINICAL DATA:  HISTORY: Right knee pain x 3 months DIAGNOSIS: Right knee pain POST PROCEDURAL DIAGNOSIS: Same SPECIMEN: Yes ANESTHESIA: Local COMPLICATIONS: None immediate EXAM: ULTRASOUND-GUIDED RIGHT KNEE ARTHROCENTESIS PROCEDURE: Using ultrasound, the right knee  was examined to determine location of fluid collection. Site was marked. The site was prepped in usual sterile fashion and approximately 3 mL 1% Lidocaine was used as local anesthesia. Thereafter, a spinal needle was guided into the suprapatellar joint space and 1 mL of purulent and bloody synovial fluid was acquired. The needle was removed and site bandaged. No complications.  Patient left department in stable condition. IMPRESSION: Technically successful RIGHT knee joint aspiration with 1 mL specimen obtained and sent for appropriate labs. Procedure performed by Pasty Spillers, PA [facility] Electronically Signed   By: Michaelle Birks M.D.   On: 11/10/2020 13:32     Imaging independently reviewed in Epic.  [facility] [phone] pager 11/10/2020, 1:54 PM

## 2020-11-10 NOTE — Plan of Care (Signed)

## 2020-11-10 NOTE — Progress Notes (Signed)
Pt is injury free, afebrile, alert and oriented x 4. VS within baseline. Nausea and vomiting improved with compazine. She complains of severe right knee pain with movement. She denies dizziness, chest pain, SOB or acute changes. Will continue to monitor

## 2020-11-11 ENCOUNTER — Inpatient Hospital Stay (HOSPITAL_COMMUNITY): Payer: Managed Care, Other (non HMO)

## 2020-11-11 DIAGNOSIS — A4901 Methicillin susceptible Staphylococcus aureus infection, unspecified site: Secondary | ICD-10-CM | POA: Diagnosis not present

## 2020-11-11 DIAGNOSIS — E43 Unspecified severe protein-calorie malnutrition: Secondary | ICD-10-CM | POA: Diagnosis not present

## 2020-11-11 DIAGNOSIS — E1069 Type 1 diabetes mellitus with other specified complication: Secondary | ICD-10-CM | POA: Diagnosis not present

## 2020-11-11 DIAGNOSIS — R112 Nausea with vomiting, unspecified: Secondary | ICD-10-CM

## 2020-11-11 DIAGNOSIS — M00061 Staphylococcal arthritis, right knee: Secondary | ICD-10-CM | POA: Diagnosis not present

## 2020-11-11 LAB — CBC
HCT: 33.3 % — ABNORMAL LOW (ref 36.0–46.0)
Hemoglobin: 10.1 g/dL — ABNORMAL LOW (ref 12.0–15.0)
MCH: 26.8 pg (ref 26.0–34.0)
MCHC: 30.3 g/dL (ref 30.0–36.0)
MCV: 88.3 fL (ref 80.0–100.0)
Platelets: 249 10*3/uL (ref 150–400)
RBC: 3.77 MIL/uL — ABNORMAL LOW (ref 3.87–5.11)
RDW: 16.1 % — ABNORMAL HIGH (ref 11.5–15.5)
WBC: 5.4 10*3/uL (ref 4.0–10.5)
nRBC: 0 % (ref 0.0–0.2)

## 2020-11-11 LAB — GLUCOSE, CAPILLARY
Glucose-Capillary: 335 mg/dL — ABNORMAL HIGH (ref 70–99)
Glucose-Capillary: 194 mg/dL — ABNORMAL HIGH (ref 70–99)
Glucose-Capillary: 138 mg/dL — ABNORMAL HIGH (ref 70–99)
Glucose-Capillary: 190 mg/dL — ABNORMAL HIGH (ref 70–99)
Glucose-Capillary: 213 mg/dL — ABNORMAL HIGH (ref 70–99)
Glucose-Capillary: 97 mg/dL (ref 70–99)

## 2020-11-11 LAB — IRON AND TIBC
Iron: 30 ug/dL (ref 28–170)
Saturation Ratios: 16 % (ref 10.4–31.8)
TIBC: 189 ug/dL — ABNORMAL LOW (ref 250–450)
UIBC: 159 ug/dL

## 2020-11-11 LAB — FERRITIN: Ferritin: 301 ng/mL (ref 11–307)

## 2020-11-11 LAB — LITHIUM LEVEL: Lithium Lvl: 0.09 mmol/L — ABNORMAL LOW (ref 0.60–1.20)

## 2020-11-11 MED ORDER — ADULT MULTIVITAMIN W/MINERALS CH
1.0000 | ORAL_TABLET | Freq: Every day | ORAL | Status: DC
  Administered 2020-11-11 – 2020-11-19 (×9): 1 via ORAL
  Filled 2020-11-11 (×9): qty 1

## 2020-11-11 MED ORDER — ALUM & MAG HYDROXIDE-SIMETH 200-200-20 MG/5ML PO SUSP: 15.0000 mL | Freq: Four times a day (QID) | ORAL | Status: DC | PRN

## 2020-11-11 MED ORDER — AMLODIPINE BESYLATE 5 MG PO TABS
5.0000 mg | ORAL_TABLET | Freq: Every day | ORAL | Status: DC
  Administered 2020-11-11 – 2020-11-12 (×2): 5 mg via ORAL
  Filled 2020-11-11 (×2): qty 1

## 2020-11-11 MED ORDER — LORAZEPAM 2 MG/ML IJ SOLN
1.0000 mg | Freq: Three times a day (TID) | INTRAMUSCULAR | Status: DC | PRN
  Administered 2020-11-11 – 2020-11-14 (×4): 1 mg via INTRAVENOUS
  Filled 2020-11-11 (×4): qty 1

## 2020-11-11 MED ORDER — MECLIZINE HCL 25 MG PO TABS
25.0000 mg | ORAL_TABLET | Freq: Three times a day (TID) | ORAL | Status: DC | PRN
  Administered 2020-11-11 – 2020-11-18 (×5): 25 mg via ORAL
  Filled 2020-11-11 (×9): qty 1

## 2020-11-11 MED ORDER — GLUCERNA SHAKE PO LIQD
237.0000 mL | Freq: Three times a day (TID) | ORAL | Status: DC
  Administered 2020-11-11 – 2020-11-18 (×8): 237 mL via ORAL
  Filled 2020-11-11 (×26): qty 237

## 2020-11-11 MED ORDER — PANTOPRAZOLE SODIUM 40 MG PO TBEC
40.0000 mg | DELAYED_RELEASE_TABLET | Freq: Every day | ORAL | Status: DC
  Administered 2020-11-11 – 2020-11-19 (×9): 40 mg via ORAL
  Filled 2020-11-11 (×9): qty 1

## 2020-11-11 NOTE — Progress Notes (Addendum)
PROGRESS NOTE    Molly [name]  ZYY:[id number] DOB: [birth year] DOA: 11/09/2020 PCP: Pcp, No   Brief Narrative: Molly [name] is a 50 y.o.  female with medical history significant of diabetes mellitus type 1, bipolar disorder, hyperlipidemia, insomnia. Patient presented secondary to a right knee septic arthritis with evidence of associated osteomyelitis on MRI.  Infectious disease and orthopedics consulted.  Orthopedics have transferred care over to their partner Dr. Lyla Glassing and patient may undergo I&D of the right knee-awaiting formal input and timing of same.   Assessment & Plan:   Principal Problem:   Septic joint of right knee joint ([place]) Active Problems:   Type 1 diabetes mellitus (HCC)   Depression   Bipolar 1 disorder (HCC)   Nausea & vomiting   HLD (hyperlipidemia)   Unspecified severe protein-calorie malnutrition (HCC)   MSSA right knee septic arthritis and acute osteomyelitis. -Follows with Dr. Percell Miller, orthopedics as outpatient - 3 months history of right knee pain, persistent and gradually worsening along with erythema and swelling.  Initially evaluated by orthopedics, work-up including DVT work-up was negative, received intra-articular steroid injection with temporary pain relief. - S/p joint aspiration in office week prior to admission by orthopedics-reportedly grew MSSA.  Subsequently hospitalized - S/p IR aspirated 1 mL of purulent and bloody synovial fluid on 10/18, gram stain shows gram-positive cocci, reintubated for better growth. - MRI right knee shows right knee septic arthritis with extensive changes of acute osteomyelitis involving the distal femur, proximal tibia and patella along with findings suggestive of right distal thigh myositis. - CRP 5.4.  ESR 94. - ID consulted and patient now on IV cefazolin - Dr. Percell Miller, orthopedics input appreciated, transferring care over to Dr. Lyla Glassing for further care including extensive I&D.  Nausea/intermittent  vomiting -Reports 3 weeks symptoms.  Intermittent vomiting at home but none since hospitalization.  Reports some heartburn. - Maximize PPI, as needed Maalox, supportive treatment with antiemetics and monitor. - If symptoms do not improve then may need to consider EGD at some point. - On lithium and Tegretol, check levels  Addendum:  Dizziness/vertigo: - Patient reports onset since last night.  States that she feels that she is spinning around. - On exam, no focal deficits.  Low index of suspicion for acute stroke. - Obtained stat CT head: No acute findings.  Progression of cerebral atrophy since 2017. - Initiated meclizine as needed for vertigo.  Could consider vestibular PT consult in AM. - Lithium level low   Diabetes mellitus, type 1 -Hemoglobin A1C of 8.5% from 8/22. Patient initially requested to manage insulin with her pump but now her pump is not working. Novolog basal rate of 1.2 units per hour via pump. - Hemoglobin A1c 6.5 on 10/18 suggesting good outpatient control. -Started Semglee 25 units daily and Novolog 5 units TID with meals -SSI qAC/HS - CBGs mildly uncontrolled and fluctuating.  Continue current regimen but monitor closely and adjust insulins as needed.  Anemia suspect chronic disease. Unsure if acute or chronic. No history of bleeding. Recent outside records suggesting stable hemoglobin from earlier this month -Anemia panel reviewed: Iron 30, ferritin 301 - Stable.  Minimize lab draws as able.   Hyperlipidemia -Continue Crestor  Hypokalemia Potassium of 2.4 on admission. Resolved with potassium supplementation   Insomnia -Continue Ambien qHS   Depression Bipolar 1 disorder -Continue Tegretol, lamotrigine, lithium (doses verified with prescriber per pharmacy) and Geodon. - Check lithium and Tegretol levels.   DVT prophylaxis: Lovenox Code Status:  Code Status: Full Code Family Communication: None at bedside Disposition Plan: To be determined pending  surgery.  Likely will need SNF.   Consultants:  Orthopedic surgery Interventional radiology Infectious disease  Procedures:  RIGHT KNEE ASPIRATION (11/10/2020)  Antimicrobials: None    Subjective: Ongoing nausea.  Denies vomiting while hospitalized.  Reports heartburn.  Has never had an EGD.  Had BM yesterday or day before.  No abdominal pain.  Denies right knee pain currently.  Objective: Vitals:   11/10/20 1217 11/10/20 1959 11/11/20 0608 11/11/20 0623  BP: (!) 159/73 (!) 150/75 (!) 173/82 (!) 161/78  Pulse: (!) 104 100 100 (!) 102  Resp:  (!) 22  18  Temp: 98.9 F (37.2 C) 99.9 F (37.7 C)  98.7 F (37.1 C)  TempSrc: Oral Oral  Oral  SpO2: 99% 99%  100%  Weight:      Height:        Intake/Output Summary (Last 24 hours) at 11/11/2020 0950 Last data filed at 11/11/2020 0316 Gross per 24 hour  Intake 2100 ml  Output --  Net 2100 ml    Filed Weights   11/10/20 0800  Weight: 66.3 kg    Examination:  General exam: Young female, moderately built and nourished lying comfortably propped up in bed without distress. Respiratory system: Clear to auscultation.  No increased work of breathing. Cardiovascular system: S1 & S2 heard, RRR. No murmurs, rubs, gallops or clicks. Gastrointestinal system: Abdomen is nondistended, soft and nontender.  No organomegaly or masses appreciated.  Normal bowel sounds heard. Central nervous system: Alert and oriented. No focal neurological deficits. Musculoskeletal: Right knee with diffuse/body swelling compared to the left, mild increased warmth, no further erythema noted, not tender to touch. Skin: No cyanosis. No rashes Psychiatry: Judgement and insight appear normal. Mood & affect appropriate.     Data Reviewed: I have personally reviewed following labs and imaging studies  CBC Lab Results  Component Value Date   WBC 5.4 11/11/2020   RBC 3.77 (L) 11/11/2020   HGB 10.1 (L) 11/11/2020   HCT 33.3 (L) 11/11/2020   MCV 88.3  11/11/2020   MCH 26.8 11/11/2020   PLT 249 11/11/2020   MCHC 30.3 11/11/2020   RDW 16.1 (H) 11/11/2020   LYMPHSABS 0.9 11/09/2020   MONOABS 0.7 11/09/2020   EOSABS 0.0 11/09/2020   BASOSABS 0.0 02/54/2706     Last metabolic panel Lab Results  Component Value Date   NA 134 (L) 11/10/2020   K 3.5 11/10/2020   CL 102 11/10/2020   CO2 25 11/10/2020   BUN 10 11/10/2020   CREATININE 0.53 11/10/2020   GLUCOSE 117 (H) 11/10/2020   GFRNONAA >60 11/10/2020   GFRAA >60 11/23/2015   CALCIUM 8.6 (L) 11/10/2020   PHOS 2.1 (L) 11/28/2009   PROT 6.8 11/10/2020   ALBUMIN 2.8 (L) 11/10/2020   BILITOT 0.5 11/10/2020   ALKPHOS 75 11/10/2020   AST 12 (L) 11/10/2020   ALT 10 11/10/2020   ANIONGAP 7 11/10/2020    CBG (last 3)  Recent Labs    11/10/20 1626 11/10/20 1956 11/11/20 0730  GLUCAP 297* 135* 335*      GFR: Estimated Creatinine Clearance: 80.9 mL/min (by C-G formula based on SCr of 0.53 mg/dL).  Coagulation Profile: No results for input(s): INR, PROTIME in the last 168 hours.  Recent Results (from the past 240 hour(s))  Resp Panel by RT-PCR (Flu A&B, Covid) Nasopharyngeal Swab     Status: None   Collection Time: 11/09/20  9:30 PM   Specimen: Nasopharyngeal Swab; Nasopharyngeal(NP) swabs in vial transport medium  Result Value Ref Range Status   SARS Coronavirus 2 by RT PCR NEGATIVE NEGATIVE Final    Comment: (NOTE) SARS-CoV-2 target nucleic acids are NOT DETECTED.  The SARS-CoV-2 RNA is generally detectable in upper respiratory specimens during the acute phase of infection. The lowest concentration of SARS-CoV-2 viral copies this assay can detect is 138 copies/mL. A negative result does not preclude SARS-Cov-2 infection and should not be used as the sole basis for treatment or other patient management decisions. A negative result may occur with  improper specimen collection/handling, submission of specimen other than nasopharyngeal swab, presence of viral  mutation(s) within the areas targeted by this assay, and inadequate number of viral copies(<138 copies/mL). A negative result must be combined with clinical observations, patient history, and epidemiological information. The expected result is Negative.  Fact Sheet for Patients:  EntrepreneurPulse.com.au  Fact Sheet for Healthcare Providers:  IncredibleEmployment.be  This test is no t yet approved or cleared by the [place] FDA and  has been authorized for detection and/or diagnosis of SARS-CoV-2 by FDA under an Emergency Use Authorization (EUA). This EUA will remain  in effect (meaning this test can be used) for the duration of the COVID-19 declaration under Section 564(b)(1) of the Act, 21 U.S.C.section 360bbb-3(b)(1), unless the authorization is terminated  or revoked sooner.       Influenza A by PCR NEGATIVE NEGATIVE Final   Influenza B by PCR NEGATIVE NEGATIVE Final    Comment: (NOTE) The Xpert Xpress SARS-CoV-2/FLU/RSV plus assay is intended as an aid in the diagnosis of influenza from Nasopharyngeal swab specimens and should not be used as a sole basis for treatment. Nasal washings and aspirates are unacceptable for Xpert Xpress SARS-CoV-2/FLU/RSV testing.  Fact Sheet for Patients: EntrepreneurPulse.com.au  Fact Sheet for Healthcare Providers: IncredibleEmployment.be  This test is not yet approved or cleared by the [place] FDA and has been authorized for detection and/or diagnosis of SARS-CoV-2 by FDA under an Emergency Use Authorization (EUA). This EUA will remain in effect (meaning this test can be used) for the duration of the COVID-19 declaration under Section 564(b)(1) of the Act, 21 U.S.C. section 360bbb-3(b)(1), unless the authorization is terminated or revoked.  Performed at [facility], [place] [address], [place], [place] [address]   Body fluid culture w  Gram Stain     Status: None (Preliminary result)   Collection Time: 11/10/20  9:10 AM   Specimen: KNEE; Body Fluid  Result Value Ref Range Status   Specimen Description   Final    KNEE RT KNEE JOINT Performed at [place] [address] [place], [place] [address]    Special Requests   Final    NONE Performed at [facility], [place] [address] [place], [place] [address]    Gram Stain   Final    MODERATE WBC PRESENT, PREDOMINANTLY PMN MODERATE GRAM POSITIVE COCCI    Culture   Final    CULTURE REINCUBATED FOR BETTER GROWTH Performed at [facility], [place] [address], [place], [place] [address]    Report Status PENDING  Incomplete  Anaerobic culture w Gram Stain     Status: None (Preliminary result)   Collection Time: 11/10/20  9:12 AM   Specimen: KNEE  Result Value Ref Range Status   Specimen Description   Final    KNEE RT KNEE JOINT Performed at [facility],  [place] [address] [place], [place] [address]    Special Requests   Final    NONE Performed at [facility], [place] [address] [place], [place] [address]    Gram Stain   Final    MODERATE WBC PRESENT, PREDOMINANTLY PMN MODERATE GRAM POSITIVE COCCI Performed at [facility], [place] [address], [place], [place] [address]    Culture PENDING  Incomplete   Report Status PENDING  Incomplete  Culture, blood (routine x 2)     Status: None (Preliminary result)   Collection Time: 11/10/20  2:22 PM   Specimen: BLOOD  Result Value Ref Range Status   Specimen Description   Final    BLOOD RIGHT ANTECUBITAL Performed at [place] [address], [place], [place] [address]    Special Requests   Final    BOTTLES DRAWN AEROBIC AND ANAEROBIC Blood Culture adequate volume Performed at [place] [address] [place], [place] [address]    Culture   Final    NO GROWTH < 24 HOURS Performed at [place] [address] [place], [place] [address]    Report Status PENDING  Incomplete  Culture, blood (routine x 2)     Status: None (Preliminary result)   Collection Time: 11/10/20  2:22 PM   Specimen: BLOOD  Result Value Ref Range Status   Specimen Description   Final    BLOOD RIGHT ANTECUBITAL Performed at [place] [address] [place], [place] [address]    Special Requests   Final    BOTTLES DRAWN AEROBIC AND ANAEROBIC Blood Culture adequate volume Performed at [place] [address] [place], [place] [address]    Culture   Final    NO GROWTH < 24 HOURS Performed at [place] [address] [place],  [address]    Report Status PENDING  Incomplete         Radiology Studies: MR KNEE RIGHT W WO CONTRAST  Result Date: 11/10/2020 CLINICAL DATA:  Septic arthritis suspected, knee, no prior imaging EXAM: MRI OF THE RIGHT KNEE WITHOUT AND WITH CONTRAST TECHNIQUE: Multiplanar, multisequence MR imaging of the right knee was performed both before and after administration of intravenous contrast. CONTRAST:  10m GADAVIST GADOBUTROL 1 MMOL/ML IV SOLN COMPARISON:  None. FINDINGS: Technical Note: Despite efforts by the technologist and patient, motion artifact is present on today's exam and could not be eliminated. This reduces exam sensitivity and specificity. Bones/Joint/Cartilage Marked bone marrow edema throughout the distal femur with involvement of the medial and lateral femoral condyles extending to the level of the distal metaphysis. There is also marked bone marrow edema and enhancement of the proximal tibia extending to the level of the proximal metaphysis. Confluent low T1 signal changes throughout the distal femur and proximal tibia are compatible with acute osteomyelitis. Erosive changes along the articular surfaces of both bones with complete joint space collapse. There is posterior subluxation of the tibia  relative to the distal femur. Fragmented bony densities along the anteromedial aspect of the proximal tibia. Marked bone marrow edema throughout the patella with erosive changes along the lateral patellar facet. Mild subchondral marrow edema within the fibular head without confluent low T1 signal changes. Small complex knee joint effusion with enhancing synovitis. Ligaments Nonvisualization of the ACL, likely torn. PCL is stretch with horizontal orientation. Collateral ligaments are poorly defined. Muscles and Tendons Intramuscular edema within the visualized distal thigh  and proximal calf musculature. No intramuscular fluid collection is evident. Extensor mechanism remains intact. Soft tissues Diffuse periarticular soft tissue edema. No organized or rim enhancing extra-articular fluid collections. IMPRESSION: 1. Right knee septic arthritis with extensive changes of acute osteomyelitis involving the distal femur, proximal tibia, and patella. 2. Intramuscular edema within the visualized distal thigh and proximal calf musculature, suggesting myositis. No organized or rim enhancing extra-articular fluid collections. These results will be called to the ordering clinician or representative by the Radiologist Assistant, and communication documented in the PACS or [employer]. Electronically Signed   By: Davina Poke D.O.   On: 11/10/2020 08:24   DG Knee Right Port  Result Date: 11/11/2020 CLINICAL DATA:  50 year old female with septic arthritis, osteomyelitis of the distal femur, proximal tibia, and patella. Knee aspiration yesterday, continued pain. EXAM: PORTABLE RIGHT KNEE - 1-2 VIEW COMPARISON:  MRI 11/09/2020. FINDINGS: 3 portable views of the right knee. There is cortical osteolysis along the joint surfaces of the medial and lateral compartments, possibly also the undersurface of the patella. Subsequent partial collapse of the tibial plateau. Abnormal heterogeneous bone mineralization. Regional soft  tissue swelling. No soft tissue gas. IMPRESSION: Septic right knee joint with widespread articular surface osteolysis in keeping with the widespread osteomyelitis demonstrated by MRI two days ago. Electronically Signed   By: Genevie Ann M.D.   On: 11/11/2020 09:35   IR DRAIN/INJ MAJOR JOINT/BURSA  Result Date: 11/10/2020 CLINICAL DATA:  HISTORY: Right knee pain x 3 months DIAGNOSIS: Right knee pain POST PROCEDURAL DIAGNOSIS: Same SPECIMEN: Yes ANESTHESIA: Local COMPLICATIONS: None immediate EXAM: ULTRASOUND-GUIDED RIGHT KNEE ARTHROCENTESIS PROCEDURE: Using ultrasound, the right knee was examined to determine location of fluid collection. Site was marked. The site was prepped in usual sterile fashion and approximately 3 mL 1% Lidocaine was used as local anesthesia. Thereafter, a spinal needle was guided into the suprapatellar joint space and 1 mL of purulent and bloody synovial fluid was acquired. The needle was removed and site bandaged. No complications.  Patient left department in stable condition. IMPRESSION: Technically successful RIGHT knee joint aspiration with 1 mL specimen obtained and sent for appropriate labs. Procedure performed by Pasty Spillers, PA [facility]iology Electronically Signed   By: Michaelle Birks M.D.   On: 11/10/2020 13:32        Scheduled Meds:  carbamazepine  1,200 mg Oral QHS   carbamazepine  200 mg Oral q morning   enoxaparin (LOVENOX) injection  40 mg Subcutaneous Q24H   insulin aspart  0-15 Units Subcutaneous TID WC   insulin aspart  0-5 Units Subcutaneous QHS   insulin aspart  5 Units Subcutaneous TID WC   insulin glargine-yfgn  25 Units Subcutaneous Daily   lamoTRIgine  400 mg Oral Daily   lithium carbonate  300 mg Oral QPM   rosuvastatin  40 mg Oral Daily   ziprasidone  120 mg Oral QHS   zolpidem  5 mg Oral QHS   Continuous Infusions:  sodium chloride 75 mL/hr at 11/11/20 0316    ceFAZolin (ANCEF) IV 2 g  (11/11/20 0754)     LOS: 2 days    Vernell Leep, MD, [place], [facility]. [facility]  To contact the attending provider between 7A-7P or the covering provider during after hours 7P-7A, please log into the web site [email or web address] and access using universal [place] password for that web site. If you do not have the password, please call the hospital operator.

## 2020-11-11 NOTE — Progress Notes (Signed)
[facility]  Date of Admission:  11/09/2020           Reason for visit: Follow up on septic arthritis and OM  Current antibiotics: Cefazolin 10/18-present   Previous antibiotics: None  ASSESSMENT:    50 y.o. female admitted with:  MSSA native right knee septic arthritis and osteomyelitis involving distal femur, proximal tibia, and patella:  Recent knee aspirate cultures done in office grew MSSA and repeat aspirate Gram stain 10/18 with GPC (cultures pending).  She was started on Cefazolin 11/10/20.  Baseline ESR 94. Type 1 DM: A1c is 6.5. Protein calorie malnutrition: Albumin 2.8  RECOMMENDATIONS:    Continue cefazolin Follow up aspirate and blood cultures Glycemic control Surgical planning  Will follow   Principal Problem:   Septic joint of right knee joint ([place]) Active Problems:   Type 1 diabetes mellitus (HCC)   Depression   Bipolar 1 disorder (HCC)   Nausea & vomiting   HLD (hyperlipidemia)   Unspecified severe protein-calorie malnutrition (HCC)    MEDICATIONS:    Scheduled Meds: . carbamazepine  1,200 mg Oral QHS  . carbamazepine  200 mg Oral q morning  . enoxaparin (LOVENOX) injection  40 mg Subcutaneous Q24H  . insulin aspart  0-15 Units Subcutaneous TID WC  . insulin aspart  0-5 Units Subcutaneous QHS  . insulin aspart  5 Units Subcutaneous TID WC  . insulin glargine-yfgn  25 Units Subcutaneous Daily  . lamoTRIgine  400 mg Oral Daily  . lithium carbonate  300 mg Oral QPM  . pantoprazole  40 mg Oral Daily  . rosuvastatin  40 mg Oral Daily  . ziprasidone  120 mg Oral QHS  . zolpidem  5 mg Oral QHS   Continuous Infusions: .  ceFAZolin (ANCEF) IV 2 g (11/11/20 0754)   PRN Meds:.acetaminophen **OR** acetaminophen, alum & mag hydroxide-simeth, cyclobenzaprine, ondansetron (ZOFRAN) IV **OR** ondansetron, oxyCODONE-acetaminophen, prochlorperazine  SUBJECTIVE:   24 hour events:  Afebrile No acute events BCx  negative Aspirate Gram stain with GPC, culture pending Started on cefazolin  Synovial cell count unable to be done d/t clot.  97% PMNs Knee x-ray today with septic right knee and widespread osteolysis.  Pain is manageable.  No fevers, chills.  Tolerating antibiotics.  No further complaints   Review of Systems  All other systems reviewed and are negative.    OBJECTIVE:   Blood pressure (!) 161/78, pulse (!) 102, temperature 98.7 F (37.1 C), temperature source Oral, resp. rate 18, height '5\' 7"'  (1.702 m), weight 66.3 kg, SpO2 100 %. Body mass index is 22.88 kg/m.  Physical Exam Constitutional:      General: She is not in acute distress.    Appearance: Normal appearance.  HENT:     Head: Normocephalic and atraumatic.  Pulmonary:     Effort: Pulmonary effort is normal. No respiratory distress.  Skin:    General: Skin is warm and dry.     Findings: No rash.  Neurological:     General: No focal deficit present.     Mental Status: She is alert and oriented to person, place, and time.  Psychiatric:        Mood and Affect: Mood normal.        Behavior: Behavior normal.     Lab Results: Lab Results  Component Value Date   WBC 5.4 11/11/2020   HGB 10.1 (L) 11/11/2020   HCT 33.3 (L) 11/11/2020   MCV 88.3 11/11/2020  PLT 249 11/11/2020    Lab Results  Component Value Date   NA 134 (L) 11/10/2020   K 3.5 11/10/2020   CO2 25 11/10/2020   GLUCOSE 117 (H) 11/10/2020   BUN 10 11/10/2020   CREATININE 0.53 11/10/2020   CALCIUM 8.6 (L) 11/10/2020   GFRNONAA >60 11/10/2020   GFRAA >60 11/23/2015    Lab Results  Component Value Date   ALT 10 11/10/2020   AST 12 (L) 11/10/2020   ALKPHOS 75 11/10/2020   BILITOT 0.5 11/10/2020       Component Value Date/Time   CRP 5.4 (H) 11/09/2020 1751       Component Value Date/Time   ESRSEDRATE 94 (H) 11/09/2020 1751     I have reviewed the micro and lab results in Epic.  Imaging: MR KNEE RIGHT W WO CONTRAST  Result  Date: 11/10/2020 CLINICAL DATA:  Septic arthritis suspected, knee, no prior imaging EXAM: MRI OF THE RIGHT KNEE WITHOUT AND WITH CONTRAST TECHNIQUE: Multiplanar, multisequence MR imaging of the right knee was performed both before and after administration of intravenous contrast. CONTRAST:  17m GADAVIST GADOBUTROL 1 MMOL/ML IV SOLN COMPARISON:  None. FINDINGS: Technical Note: Despite efforts by the technologist and patient, motion artifact is present on today's exam and could not be eliminated. This reduces exam sensitivity and specificity. Bones/Joint/Cartilage Marked bone marrow edema throughout the distal femur with involvement of the medial and lateral femoral condyles extending to the level of the distal metaphysis. There is also marked bone marrow edema and enhancement of the proximal tibia extending to the level of the proximal metaphysis. Confluent low T1 signal changes throughout the distal femur and proximal tibia are compatible with acute osteomyelitis. Erosive changes along the articular surfaces of both bones with complete joint space collapse. There is posterior subluxation of the tibia relative to the distal femur. Fragmented bony densities along the anteromedial aspect of the proximal tibia. Marked bone marrow edema throughout the patella with erosive changes along the lateral patellar facet. Mild subchondral marrow edema within the fibular head without confluent low T1 signal changes. Small complex knee joint effusion with enhancing synovitis. Ligaments Nonvisualization of the ACL, likely torn. PCL is stretch with horizontal orientation. Collateral ligaments are poorly defined. Muscles and Tendons Intramuscular edema within the visualized distal thigh and proximal calf musculature. No intramuscular fluid collection is evident. Extensor mechanism remains intact. Soft tissues Diffuse periarticular soft tissue edema. No organized or rim enhancing extra-articular fluid collections. IMPRESSION: 1.  Right knee septic arthritis with extensive changes of acute osteomyelitis involving the distal femur, proximal tibia, and patella. 2. Intramuscular edema within the visualized distal thigh and proximal calf musculature, suggesting myositis. No organized or rim enhancing extra-articular fluid collections. These results will be called to the ordering clinician or representative by the Radiologist Assistant, and communication documented in the PACS or [employer] Electronically Signed   By: NDavina PokeD.O.   On: 11/10/2020 08:24   DG Knee Right Port  Result Date: 11/11/2020 CLINICAL DATA:  50year old female with septic arthritis, osteomyelitis of the distal femur, proximal tibia, and patella. Knee aspiration yesterday, continued pain. EXAM: PORTABLE RIGHT KNEE - 1-2 VIEW COMPARISON:  MRI 11/09/2020. FINDINGS: 3 portable views of the right knee. There is cortical osteolysis along the joint surfaces of the medial and lateral compartments, possibly also the undersurface of the patella. Subsequent partial collapse of the tibial plateau. Abnormal heterogeneous bone mineralization. Regional soft tissue swelling. No soft tissue gas. IMPRESSION: Septic right knee joint  with widespread articular surface osteolysis in keeping with the widespread osteomyelitis demonstrated by MRI two days ago. Electronically Signed   By: Genevie Ann M.D.   On: 11/11/2020 09:35   IR DRAIN/INJ MAJOR JOINT/BURSA  Result Date: 11/10/2020 CLINICAL DATA:  HISTORY: Right knee pain x 3 months DIAGNOSIS: Right knee pain POST PROCEDURAL DIAGNOSIS: Same SPECIMEN: Yes ANESTHESIA: Local COMPLICATIONS: None immediate EXAM: ULTRASOUND-GUIDED RIGHT KNEE ARTHROCENTESIS PROCEDURE: Using ultrasound, the right knee was examined to determine location of fluid collection. Site was marked. The site was prepped in usual sterile fashion and approximately 3 mL 1% Lidocaine was used as local anesthesia. Thereafter, a spinal needle was guided into the  suprapatellar joint space and 1 mL of purulent and bloody synovial fluid was acquired. The needle was removed and site bandaged. No complications.  Patient left department in stable condition. IMPRESSION: Technically successful RIGHT knee joint aspiration with 1 mL specimen obtained and sent for appropriate labs. Procedure performed by Pasty Spillers, PA [facility] Electronically Signed   By: Michaelle Birks M.D.   On: 11/10/2020 13:32     Imaging independently reviewed in Epic.    [facility] [phone] pager 11/11/2020, 10:58 AM  I spent greater than 35 minutes with the patient including greater than 50% of time in face to face counsel of the patient and in coordination of their care.

## 2020-11-11 NOTE — Progress Notes (Signed)
Pt was ambulating to use BSC and became dizzy. BP after sitting down was elevated however BP went down after resting for a few min. Going to notify the DR that we may have orthostatic BP issues. Pt was still to dizzy to try to do a set of ortho statics at that moment . BP are documented

## 2020-11-11 NOTE — Progress Notes (Signed)
1240-Pt had called out stating her dizziness is increasing. Upon assessment pt is rambling and very difficult to understand. Pt is able to follow commands, but uncoordinated. States that she cannot swallow. Dr. Waymon Amato was on the floor so he was able to come to the bedside and assess pt. New orders placed.   1250-this nurse and Britta Mccreedy, RN transported pt to STAT CT. While waiting for test, pt is rambling again and repeating random words. Rapid response nurse notified of situation.   1317-Pt back to room 1506 at this time. VS WNL, blood glucose is 190. Pt still states that she feels unable to swallow. Denies difficulty breathing or pain other than usual right knee pain.

## 2020-11-11 NOTE — Progress Notes (Signed)
Inpatient Diabetes Program Recommendations  AACE/ADA: New Consensus Statement on Inpatient Glycemic Control (2015)  Target Ranges:  Prepandial:   less than 140 mg/dL      Peak postprandial:   less than 180 mg/dL (1-2 hours)      Critically ill patients:  140 - 180 mg/dL   Lab Results  Component Value Date   GLUCAP 97 11/11/2020   HGBA1C 6.5 (H) 11/09/2020    Review of Glycemic Control  Diabetes history: DM1 Outpatient Diabetes medications: Insulin pump Current orders for Inpatient glycemic control: Semglee 25 units QD, Novolog 0-15 units TID with meals and 0-5 HS + 5 units TID  HgbA1C - 6.5% Pump settings previously at 1.2units/H  Inpatient Diabetes Program Recommendations:    Consider decreasing Novolog to 0-9 units TID with meals and 0-5 HS, as pt is Type 1 and sensitive to insulin.  Briefly spoke with pt at bedside about her diabetes. Said her pump was malfunctioning and she did not have it with her. Said her basal rate was 1.2/H and agreed to 5 units of rapid-acting insulin TID. Strange affect and did not talk very much.   Will watch glucose trends daily. Awaiting knee surgery.   Thank you. Ailene Ards, RD, LDN, CDE Inpatient Diabetes Coordinator [phone]

## 2020-11-11 NOTE — Progress Notes (Signed)
Initial Nutrition Assessment  DOCUMENTATION CODES:   Severe malnutrition in context of acute illness/injury  INTERVENTION:   -Glucerna Shake po TID, each supplement provides 220 kcal and 10 grams of protein  -Multivitamin with minerals daily  NUTRITION DIAGNOSIS:   Severe Malnutrition related to acute illness (right knee septic arthritis and acute osteomyelitis) as evidenced by percent weight loss, energy intake < or equal to 50% for > or equal to 1 month, severe muscle depletion, mild fat depletion.  GOAL:   Patient will meet greater than or equal to 90% of their needs  MONITOR:   PO intake, Supplement acceptance, Labs, Weight trends, Skin, I & O's  REASON FOR ASSESSMENT:   Consult Assessment of nutrition requirement/status, Wound healing  ASSESSMENT:   50 y.o.  female with medical history significant of diabetes mellitus type 1, bipolar disorder, hyperlipidemia, insomnia. Patient presented secondary to a right knee septic arthritis with evidence of associated osteomyelitis on MRI. Orthopedic surgery on board with plan for eventual surgical management.  10/18: s/p right knee aspiration  Patient reports not eating this morning d/t continued nausea. Had just received medication and waiting to order some lunch.  Reports 100% completion of meals yesterday which consisted of clear liquids for breakfast and lunch. Then she had a dinner of fish, broccoli and peas.   Pt states Boost has raised her blood sugars in the past. Will order Glucerna shakes for additional kcals and protein between meals.  Pt states PTA she was unable to keep anything down for 3 weeks d/t N/V. Most days consumed 2 eggs for breakfast and then a Lipton soup for lunch, and a small portion of dinner in which her mother would provide roast or chicken casserole.   Pt with T1DM, uses an insulin pump which she states she "fried out". Blood sugars have been elevated recently since knee infection started. Was having  to use increased insulin dosages.   Pt states she is now wheelchair bound and has been for the past 3 months when her knee pain began.   UBW is ~189 lbs. Per weight records, she last weighed this in June 2022. This a 22% wt loss x 4 months which is significant for time frame.  I/Os: +2.6L since admit  Medications: IV Compazine  Labs reviewed: CBGs: 135-335   NUTRITION - FOCUSED PHYSICAL EXAM:  Flowsheet Row Most Recent Value  Orbital Region No depletion  Upper Arm Region Mild depletion  Thoracic and Lumbar Region Moderate depletion  Buccal Region Mild depletion  Temple Region No depletion  Clavicle Bone Region Severe depletion  Clavicle and Acromion Bone Region Severe depletion  Scapular Bone Region Unable to assess  Dorsal Hand Mild depletion  Patellar Region Moderate depletion  Anterior Thigh Region Moderate depletion  Posterior Calf Region Moderate depletion  Edema (RD Assessment) --  [rt knee]  Hair Reviewed  [reports no hair loss]  Eyes Reviewed  Mouth Reviewed  Skin Reviewed  [dry, flaky]  Nails Reviewed       Diet Order:   Diet Order             Diet Carb Modified Fluid consistency: Thin; Room service appropriate? Yes  Diet effective now                   EDUCATION NEEDS:   No education needs have been identified at this time  Skin:  Skin Assessment: Reviewed RN Assessment  Last BM:  10/18  Height:   Ht Readings from Last 1  Encounters:  11/10/20 5\' 7"  (1.702 m)    Weight:   Wt Readings from Last 1 Encounters:  11/10/20 66.3 kg    BMI:  Body mass index is 22.88 kg/m.  Estimated Nutritional Needs:   Kcal:  1950-2150  Protein:  95-105g  Fluid:  2.1L/day   11/12/20, MS, RD, LDN Inpatient Clinical Dietitian Contact information available via Amion

## 2020-11-12 ENCOUNTER — Inpatient Hospital Stay (HOSPITAL_COMMUNITY): Payer: Managed Care, Other (non HMO)

## 2020-11-12 DIAGNOSIS — R42 Dizziness and giddiness: Secondary | ICD-10-CM | POA: Diagnosis not present

## 2020-11-12 DIAGNOSIS — I10 Essential (primary) hypertension: Secondary | ICD-10-CM | POA: Diagnosis not present

## 2020-11-12 DIAGNOSIS — M00061 Staphylococcal arthritis, right knee: Secondary | ICD-10-CM | POA: Diagnosis not present

## 2020-11-12 LAB — BODY FLUID CULTURE W GRAM STAIN

## 2020-11-12 LAB — GLUCOSE, CAPILLARY
Glucose-Capillary: 258 mg/dL — ABNORMAL HIGH (ref 70–99)
Glucose-Capillary: 210 mg/dL — ABNORMAL HIGH (ref 70–99)
Glucose-Capillary: 236 mg/dL — ABNORMAL HIGH (ref 70–99)
Glucose-Capillary: 146 mg/dL — ABNORMAL HIGH (ref 70–99)

## 2020-11-12 MED ORDER — AMLODIPINE BESYLATE 5 MG PO TABS
5.0000 mg | ORAL_TABLET | Freq: Once | ORAL | Status: AC
  Administered 2020-11-12: 5 mg via ORAL
  Filled 2020-11-12: qty 1

## 2020-11-12 MED ORDER — INSULIN ASPART 100 UNIT/ML IJ SOLN
0.0000 [IU] | Freq: Three times a day (TID) | INTRAMUSCULAR | Status: DC
  Administered 2020-11-12: 3 [IU] via SUBCUTANEOUS
  Administered 2020-11-12: 5 [IU] via SUBCUTANEOUS
  Administered 2020-11-12: 8 [IU] via SUBCUTANEOUS
  Administered 2020-11-13: 7 [IU] via SUBCUTANEOUS
  Administered 2020-11-14 (×2): 2 [IU] via SUBCUTANEOUS
  Administered 2020-11-14: 3 [IU] via SUBCUTANEOUS
  Administered 2020-11-15: 7 [IU] via SUBCUTANEOUS
  Administered 2020-11-15: 3 [IU] via SUBCUTANEOUS
  Administered 2020-11-15: 2 [IU] via SUBCUTANEOUS
  Administered 2020-11-16: 1 [IU] via SUBCUTANEOUS
  Administered 2020-11-16 (×2): 2 [IU] via SUBCUTANEOUS
  Administered 2020-11-17 (×2): 1 [IU] via SUBCUTANEOUS
  Administered 2020-11-18 (×2): 3 [IU] via SUBCUTANEOUS
  Administered 2020-11-18: 5 [IU] via SUBCUTANEOUS

## 2020-11-12 MED ORDER — INSULIN ASPART 100 UNIT/ML IJ SOLN: 0.0000 [IU] | Freq: Every day | INTRAMUSCULAR | Status: DC

## 2020-11-12 MED ORDER — AMLODIPINE BESYLATE 10 MG PO TABS
10.0000 mg | ORAL_TABLET | Freq: Every day | ORAL | Status: DC
  Administered 2020-11-13 – 2020-11-19 (×7): 10 mg via ORAL
  Filled 2020-11-12 (×7): qty 1

## 2020-11-12 NOTE — H&P (View-Only) (Signed)
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 female who has had about 4 months of increasing right knee pain.  She has been extensively worked up as an outpatient.  She saw Dr. Eulah Pont in the office last week, and aspiration of the right knee was performed showing MSSA.  She came to the hospital earlier this week.  X-rays showed complete loss of right knee joint space with posteromedial proximal tibia bone loss.  A new MRI was obtained showing osteomyelitis of the distal femur, proximal tibia, and patella.  She had an image guided aspiration of the right knee by IR, which yielded purulent joint fluid.  The specimen was clotted, so white blood cell count was unable to be performed.  She had 97% neutrophils.  Gram stains showed gram-positive cocci.  The culture is growing staph aureus, and sensitivities are pending.  Her serum white blood cell count is currently 5.4.  Her CRP is elevated at 5.4 mg/dL.  Sed rate is elevated at 94 mm/h.  I was asked to take over care by Dr. Eulah Pont.  Past Medical History:  Diagnosis Date   ALLERGIC RHINITIS CAUSE UNSPECIFIED 03/06/2007   Bipolar 1 disorder (HCC)    CONTUSION, ANKLE 08/28/2007   DEPRESSION 08/03/2006   DIABETES MELLITUS, TYPE I 08/03/2006   DM retinopathy (HCC)    Encounter for long-term (current) use of other medications    Hyperlipidemia    MIGRAINE, CHRONIC 03/06/2007   Osteoporosis    SEBACEOUS CYST, INFECTED 07/13/2007   SMOKER 08/11/2009   THROMBOCYTOPENIA 05/07/2008   TINEA VERSICOLOR 07/13/2007   Past Surgical History:  Procedure Laterality Date   Abnormal U/S  12/10/1996   ELECTROCARDIOGRAM  06/13/2006   TUBAL LIGATION     Social History   Socioeconomic History   Marital status: Widowed    Spouse name: Not on file   Number of children: Not on file    Years of education: Not on file   Highest education level: Not on file  Occupational History   Not on file  Tobacco Use   Smoking status: Former    Types: Cigarettes    Quit date: 10/16/2010    Years since quitting: 10.0   Smokeless tobacco: Never  Vaping Use   Vaping Use: Never used  Substance and Sexual Activity   Alcohol use: No   Drug use: No   Sexual activity: Not Currently    Comment: tubal ligation, intercourse unknown , 5 seuxal partners,des neg  Other Topics Concern   Not on file  Social History Narrative   Does not work outside the home   Social Determinants of Health   Financial Resource Strain: Not on file  Food Insecurity: Not on file  Transportation Needs: Not on file  Physical Activity: Not on file  Stress: Not on file  Social Connections: Not on file   Family History  Problem Relation Age of Onset   Diabetes Father        (Oral Agents)   Diabetes Paternal Grandmother    Hypertension Paternal Grandmother    Diabetes Paternal Grandfather    Cancer Paternal Grandfather        colon   Hypertension Paternal Grandfather    Allergies  Allergen Reactions   Varenicline Tartrate     REACTION: rash   Prior to  Admission medications   Medication Sig Start Date End Date Taking? Authorizing Provider  amLODipine (NORVASC) 5 MG tablet Take 5 mg by mouth daily.   Yes [provider]  Calcium Carbonate (CALCIUM 600 PO) Take 1 tablet by mouth 2 (two) times daily.    Yes [provider]  carbamazepine (TEGRETOL) 200 MG tablet Take 2-4 tablets (400-800 mg total) by mouth 2 (two) times daily. 5 in the morning/ 1 in the evening Patient taking differently: No sig reported 01/02/18  Yes Murray, Laura Woodruff, FNP  cetirizine-pseudoephedrine (ZYRTEC-D) 5-120 MG tablet Take 1 tablet by mouth daily. 03/22/17  Yes Ellison, Sean, MD  cyclobenzaprine (FLEXERIL) 10 MG tablet Take 20 mg by mouth at bedtime.   Yes [provider]  FLOVENT DISKUS 50  MCG/BLIST diskus inhaler INHALE 1 PUFF BY MOUTH INTO THE LUNGS 2 TIMES DAILY Patient taking differently: Inhale 1 puff into the lungs 2 (two) times daily. 12/07/18  Yes Murray, Laura Woodruff, FNP  KLOR-CON M20 20 MEQ tablet Take 20 mEq by mouth daily. 11/02/20  Yes [provider]  lamoTRIgine (LAMICTAL) 200 MG tablet Take 200 mg by mouth 2 (two) times daily.    Yes [provider]  lithium carbonate (LITHOBID) 300 MG CR tablet Take 300 mg by mouth daily.   Yes [provider]  LYUMJEV 100 UNIT/ML SOLN Inject 150 Units into the skin See admin instructions. Insulin pump 11/04/20  Yes [provider]  Multiple Vitamin (MULTIVITAMIN) tablet Take 1 tablet by mouth daily.   Yes [provider]  ondansetron (ZOFRAN) 4 MG tablet TAKE 1 TABLET BY MOUTH EVERY 8 HOURS AS NEEDED FOR NAUSEA AND VOMITING Patient taking differently: Take 4 mg by mouth every 8 (eight) hours as needed for nausea or vomiting. 10/21/17  Yes Ellison, Sean, MD  rosuvastatin (CRESTOR) 40 MG tablet TAKE 1 TABLET BY MOUTH DAILY GENERIC EQUIVALENT FOR CRESTOR Patient taking differently: Take 40 mg by mouth daily. 12/10/18  Yes Murray, Laura Woodruff, FNP  Sennosides 25 MG TABS Take 1 tablet by mouth daily.   Yes [provider]  ziprasidone (GEODON) 60 MG capsule Take 120 mg by mouth at bedtime. Take 2 by mouth at bedtime   Yes [provider]  zolpidem (AMBIEN) 10 MG tablet Take 20 mg by mouth at bedtime. 11/20/15  Yes [provider]  Alcohol Swabs (ALCOHOL PREP) 70 % PADS 8/day  250.01 10/27/11   Ellison, Sean, MD  alendronate (FOSAMAX) 70 MG tablet TAKE 1 TABLET BY MOUTH EVERY 7 DAYS. TAKE WITH FULL GLASS OF WATER ON AN EMPTY STOMACH Patient not taking: No sig reported 08/01/16   Ellison, Sean, MD  cetirizine-pseudoephedrine (ZYRTEC-D) 5-120 MG tablet Take 1 tablet by mouth 2 (two) times daily. Patient not taking: No sig reported 04/27/18   Murray, Laura Woodruff, FNP   glucose blood (FREESTYLE LITE) test strip 8/day dx 250.01, and lancets 06/01/15   Ellison, Sean, MD  Insulin Infusion Pump Supplies (MINIMED INFUSION SET-MMT 398) MISC Inject 1 Device into the skin 3 days. Change every 3 days Patient not taking: Reported on 11/09/2020 06/01/15   Ellison, Sean, MD  Insulin Infusion Pump Supplies (PARADIGM RESERVOIR 3ML) MISC Inject 1 Device into the skin 3 days. Change every 3 days 06/01/15   Ellison, Sean, MD  insulin regular (NOVOLIN R) 100 units/mL injection INJECT A TOTAL OF 100 UNITS UNDER THE SKIN EVERY DAY VIA INSULIN PUMP; CALL TO SCHEDULE APPT ASAP Patient not taking: Reported on 11/09/2020 03/20/18     Romero Belling, MD  Lancets (FREESTYLE) lancets Use as instructed dx 250.01 08/10/12   Romero Belling, MD  rizatriptan (MAXALT) 10 MG tablet Take 1 tablet (10 mg total) by mouth as needed for migraine. May repeat in 2 hours if needed Patient not taking: No sig reported 11/09/16   Romero Belling, MD  triamcinolone cream (KENALOG) 0.1 % Apply 1 application topically daily. Patient not taking: No sig reported 07/26/17   [name], MD   CT HEAD WO CONTRAST ( )  Result Date: 11/11/2020 CLINICAL DATA:  Dizziness, confusion, slurred speech no acute abnormality. EXAM: CT HEAD WITHOUT CONTRAST TECHNIQUE: Contiguous axial images were obtained from the base of the skull through the vertex without intravenous contrast. COMPARISON:  11/23/2015 FINDINGS: Brain: Mild generalized atrophy. Negative for acute infarct, hemorrhage, mass. Vascular: Negative for hyperdense vessel Skull: Negative Sinuses/Orbits: Negative Other: None IMPRESSION: No acute abnormality. Mild cerebral atrophy with progression since 2017. Electronically Signed   By: Marlan Palau M.D.   On: 11/11/2020 13:15   MR BRAIN WO CONTRAST  Result Date: 11/12/2020 CLINICAL DATA:  Dizziness EXAM: MRI HEAD WITHOUT CONTRAST TECHNIQUE: Multiplanar, multiecho pulse sequences of the brain and surrounding structures  were obtained without intravenous contrast. COMPARISON:  CT head 11/11/2020 FINDINGS: Brain: No acute infarction, hemorrhage, hydrocephalus, extra-axial collection or mass lesion. Mild cortical atrophy for age. Normal white matter. Vascular: Normal arterial flow voids. Skull and upper cervical spine: No focal skeletal lesion. Sinuses/Orbits: Negative Other: None IMPRESSION: Mild cerebral atrophy.  No acute abnormality. Electronically Signed   By: Marlan Palau M.D.   On: 11/12/2020 13:30   DG Knee Right Port  Result Date: 11/11/2020 CLINICAL DATA:  50 year old female with septic arthritis, osteomyelitis of the distal femur, proximal tibia, and patella. Knee aspiration yesterday, continued pain. EXAM: PORTABLE RIGHT KNEE - 1-2 VIEW COMPARISON:  MRI 11/09/2020. FINDINGS: 3 portable views of the right knee. There is cortical osteolysis along the joint surfaces of the medial and lateral compartments, possibly also the undersurface of the patella. Subsequent partial collapse of the tibial plateau. Abnormal heterogeneous bone mineralization. Regional soft tissue swelling. No soft tissue gas. IMPRESSION: Septic right knee joint with widespread articular surface osteolysis in keeping with the widespread osteomyelitis demonstrated by MRI two days ago. Electronically Signed   By: Odessa Fleming M.D.   On: 11/11/2020 09:35    Positive ROS: All other systems have been reviewed and were otherwise negative with the exception of those mentioned in the HPI and as above.  Physical Exam: General: Alert, no acute distress Cardiovascular: No pedal edema Respiratory: No cyanosis, no use of accessory musculature GI: No organomegaly, abdomen is soft and non-tender Skin: No lesions in the area of chief complaint Neurologic: Sensation intact distally Psychiatric: Patient is competent for consent with normal mood and affect Lymphatic: No axillary or cervical lymphadenopathy  MUSCULOSKELETAL: Examination of the right knee reveals  no skin wounds or lesions.  No warmth or appreciable effusion.  She has global diffuse tenderness to palpation.  Her knee is very stiff.  She guards during exam.  She has a very significant flexion contracture and limited flexion.  She is neurovascularly intact.  Assessment: Chronic septic arthritis right knee with osteomyelitis. Type 1 diabetes with last A1c 6.5.  Plan: I discussed the findings with the patient.  This is an extremely challenging situation.  She has chronic right knee NSA with osteomyelitis and significant bone loss.  I recommend debridement of the right knee with placement of articulating versus static antibiotic  spacer.  We discussed the risk, benefits, and alternatives to surgery.  We discussed postoperative infectious disease consult, PICC line for prolonged IV antibiotics.  When she has completed her course of IV antibiotics, we will give her a 2-week holiday and then repeat her blood work.  She may need a repeat knee aspiration as well.  If the infection is cleared, then we can discuss resection of the spacer and  pacing a total knee arthroplasty.  If she is still positive for infection at that point, she will need repeat debridement and IV antibiotic treatment.  She understands that there is a possibility of needing a knee arthrodesis in the future.  She understands that there is also a possibility she could require an AKA.  We discussed that she will likely be unable to work for the next 6 to 12 months.  We will plan for surgery tomorrow. NPO after MN. Hold chemical DVT ppx.     Jonette Pesa, MD [phone]    11/12/2020 6:33 PM

## 2020-11-12 NOTE — Consult Note (Signed)
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 female who has had about 4 months of increasing right knee pain.  She has been extensively worked up as an outpatient.  She saw Dr. Eulah Pont in the office last week, and aspiration of the right knee was performed showing MSSA.  She came to the hospital earlier this week.  X-rays showed complete loss of right knee joint space with posteromedial proximal tibia bone loss.  A new MRI was obtained showing osteomyelitis of the distal femur, proximal tibia, and patella.  She had an image guided aspiration of the right knee by IR, which yielded purulent joint fluid.  The specimen was clotted, so white blood cell count was unable to be performed.  She had 97% neutrophils.  Gram stains showed gram-positive cocci.  The culture is growing staph aureus, and sensitivities are pending.  Her serum white blood cell count is currently 5.4.  Her CRP is elevated at 5.4 mg/dL.  Sed rate is elevated at 94 mm/h.  I was asked to take over care by Dr. Eulah Pont.  Past Medical History:  Diagnosis Date   ALLERGIC RHINITIS CAUSE UNSPECIFIED 03/06/2007   Bipolar 1 disorder (HCC)    CONTUSION, ANKLE 08/28/2007   DEPRESSION 08/03/2006   DIABETES MELLITUS, TYPE I 08/03/2006   DM retinopathy (HCC)    Encounter for long-term (current) use of other medications    Hyperlipidemia    MIGRAINE, CHRONIC 03/06/2007   Osteoporosis    SEBACEOUS CYST, INFECTED 07/13/2007   SMOKER 08/11/2009   THROMBOCYTOPENIA 05/07/2008   TINEA VERSICOLOR 07/13/2007   Past Surgical History:  Procedure Laterality Date   Abnormal U/S  12/10/1996   ELECTROCARDIOGRAM  06/13/2006   TUBAL LIGATION     Social History   Socioeconomic History   Marital status: Widowed    Spouse name: Not on file   Number of children: Not on file    Years of education: Not on file   Highest education level: Not on file  Occupational History   Not on file  Tobacco Use   Smoking status: Former    Types: Cigarettes    Quit date: 10/16/2010    Years since quitting: 10.0   Smokeless tobacco: Never  Vaping Use   Vaping Use: Never used  Substance and Sexual Activity   Alcohol use: No   Drug use: No   Sexual activity: Not Currently    Comment: tubal ligation, intercourse unknown , 5 seuxal partners,des neg  Other Topics Concern   Not on file  Social History Narrative   Does not work outside the home   Social Determinants of Health   Financial Resource Strain: Not on file  Food Insecurity: Not on file  Transportation Needs: Not on file  Physical Activity: Not on file  Stress: Not on file  Social Connections: Not on file   Family History  Problem Relation Age of Onset   Diabetes Father        (Oral Agents)   Diabetes Paternal Grandmother    Hypertension Paternal Grandmother    Diabetes Paternal Grandfather    Cancer Paternal Grandfather        colon   Hypertension Paternal Grandfather    Allergies  Allergen Reactions   Varenicline Tartrate     REACTION: rash   Prior to  Admission medications   Medication Sig Start Date End Date Taking? Authorizing Provider  amLODipine (NORVASC) 5 MG tablet Take 5 mg by mouth daily.   Yes [provider]  Calcium Carbonate (CALCIUM 600 PO) Take 1 tablet by mouth 2 (two) times daily.    Yes [provider]  carbamazepine (TEGRETOL) 200 MG tablet Take 2-4 tablets (400-800 mg total) by mouth 2 (two) times daily. 5 in the morning/ 1 in the evening Patient taking differently: No sig reported 01/02/18  Yes Olive Bass, FNP  cetirizine-pseudoephedrine (ZYRTEC-D) 5-120 MG tablet Take 1 tablet by mouth daily. 03/22/17  Yes Romero Belling, MD  cyclobenzaprine (FLEXERIL) 10 MG tablet Take 20 mg by mouth at bedtime.   Yes [provider]  FLOVENT DISKUS 50  MCG/BLIST diskus inhaler INHALE 1 PUFF BY MOUTH INTO THE LUNGS 2 TIMES DAILY Patient taking differently: Inhale 1 puff into the lungs 2 (two) times daily. 12/07/18  Yes Olive Bass, FNP  KLOR-CON M20 20 MEQ tablet Take 20 mEq by mouth daily. 11/02/20  Yes [provider]  lamoTRIgine (LAMICTAL) 200 MG tablet Take 200 mg by mouth 2 (two) times daily.    Yes [provider]  lithium carbonate (LITHOBID) 300 MG CR tablet Take 300 mg by mouth daily.   Yes [provider]  LYUMJEV 100 UNIT/ML SOLN Inject 150 Units into the skin See admin instructions. Insulin pump 11/04/20  Yes [provider]  Multiple Vitamin (MULTIVITAMIN) tablet Take 1 tablet by mouth daily.   Yes [provider]  ondansetron (ZOFRAN) 4 MG tablet TAKE 1 TABLET BY MOUTH EVERY 8 HOURS AS NEEDED FOR NAUSEA AND VOMITING Patient taking differently: Take 4 mg by mouth every 8 (eight) hours as needed for nausea or vomiting. 10/21/17  Yes Romero Belling, MD  rosuvastatin (CRESTOR) 40 MG tablet TAKE 1 TABLET BY MOUTH DAILY GENERIC EQUIVALENT FOR CRESTOR Patient taking differently: Take 40 mg by mouth daily. 12/10/18  Yes Olive Bass, FNP  Sennosides 25 MG TABS Take 1 tablet by mouth daily.   Yes [provider]  ziprasidone (GEODON) 60 MG capsule Take 120 mg by mouth at bedtime. Take 2 by mouth at bedtime   Yes [provider]  zolpidem (AMBIEN) 10 MG tablet Take 20 mg by mouth at bedtime. 11/20/15  Yes [provider]  Alcohol Swabs (ALCOHOL PREP) 70 % PADS 8/day  250.01 10/27/11   Romero Belling, MD  alendronate (FOSAMAX) 70 MG tablet TAKE 1 TABLET BY MOUTH EVERY 7 DAYS. TAKE WITH FULL GLASS OF WATER ON AN EMPTY STOMACH Patient not taking: No sig reported 08/01/16   Romero Belling, MD  cetirizine-pseudoephedrine (ZYRTEC-D) 5-120 MG tablet Take 1 tablet by mouth 2 (two) times daily. Patient not taking: No sig reported 04/27/18   Olive Bass, FNP   glucose blood (FREESTYLE LITE) test strip 8/day dx 250.01, and lancets 06/01/15   Romero Belling, MD  Insulin Infusion Pump Supplies (MINIMED INFUSION SET-MMT 398) MISC Inject 1 Device into the skin 3 days. Change every 3 days Patient not taking: Reported on 11/09/2020 06/01/15   Romero Belling, MD  Insulin Infusion Pump Supplies (PARADIGM RESERVOIR ) MISC Inject 1 Device into the skin 3 days. Change every 3 days 06/01/15   Romero Belling, MD  insulin regular (NOVOLIN R) 100 units/mL injection INJECT A TOTAL OF 100 UNITS UNDER THE SKIN EVERY DAY VIA INSULIN PUMP; CALL TO SCHEDULE APPT ASAP Patient not taking: Reported on 11/09/2020 03/20/18  Romero Belling, MD  Lancets (FREESTYLE) lancets Use as instructed dx 250.01 08/10/12   Romero Belling, MD  rizatriptan (MAXALT) 10 MG tablet Take 1 tablet (10 mg total) by mouth as needed for migraine. May repeat in 2 hours if needed Patient not taking: No sig reported 11/09/16   Romero Belling, MD  triamcinolone cream (KENALOG) 0.1 % Apply 1 application topically daily. Patient not taking: No sig reported 07/26/17   [name], MD   CT HEAD WO CONTRAST ( )  Result Date: 11/11/2020 CLINICAL DATA:  Dizziness, confusion, slurred speech no acute abnormality. EXAM: CT HEAD WITHOUT CONTRAST TECHNIQUE: Contiguous axial images were obtained from the base of the skull through the vertex without intravenous contrast. COMPARISON:  11/23/2015 FINDINGS: Brain: Mild generalized atrophy. Negative for acute infarct, hemorrhage, mass. Vascular: Negative for hyperdense vessel Skull: Negative Sinuses/Orbits: Negative Other: None IMPRESSION: No acute abnormality. Mild cerebral atrophy with progression since 2017. Electronically Signed   By: Marlan Palau M.D.   On: 11/11/2020 13:15   MR BRAIN WO CONTRAST  Result Date: 11/12/2020 CLINICAL DATA:  Dizziness EXAM: MRI HEAD WITHOUT CONTRAST TECHNIQUE: Multiplanar, multiecho pulse sequences of the brain and surrounding structures  were obtained without intravenous contrast. COMPARISON:  CT head 11/11/2020 FINDINGS: Brain: No acute infarction, hemorrhage, hydrocephalus, extra-axial collection or mass lesion. Mild cortical atrophy for age. Normal white matter. Vascular: Normal arterial flow voids. Skull and upper cervical spine: No focal skeletal lesion. Sinuses/Orbits: Negative Other: None IMPRESSION: Mild cerebral atrophy.  No acute abnormality. Electronically Signed   By: Marlan Palau M.D.   On: 11/12/2020 13:30   DG Knee Right Port  Result Date: 11/11/2020 CLINICAL DATA:  50 year old female with septic arthritis, osteomyelitis of the distal femur, proximal tibia, and patella. Knee aspiration yesterday, continued pain. EXAM: PORTABLE RIGHT KNEE - 1-2 VIEW COMPARISON:  MRI 11/09/2020. FINDINGS: 3 portable views of the right knee. There is cortical osteolysis along the joint surfaces of the medial and lateral compartments, possibly also the undersurface of the patella. Subsequent partial collapse of the tibial plateau. Abnormal heterogeneous bone mineralization. Regional soft tissue swelling. No soft tissue gas. IMPRESSION: Septic right knee joint with widespread articular surface osteolysis in keeping with the widespread osteomyelitis demonstrated by MRI two days ago. Electronically Signed   By: Odessa Fleming M.D.   On: 11/11/2020 09:35    Positive ROS: All other systems have been reviewed and were otherwise negative with the exception of those mentioned in the HPI and as above.  Physical Exam: General: Alert, no acute distress Cardiovascular: No pedal edema Respiratory: No cyanosis, no use of accessory musculature GI: No organomegaly, abdomen is soft and non-tender Skin: No lesions in the area of chief complaint Neurologic: Sensation intact distally Psychiatric: Patient is competent for consent with normal mood and affect Lymphatic: No axillary or cervical lymphadenopathy  MUSCULOSKELETAL: Examination of the right knee reveals  no skin wounds or lesions.  No warmth or appreciable effusion.  She has global diffuse tenderness to palpation.  Her knee is very stiff.  She guards during exam.  She has a very significant flexion contracture and limited flexion.  She is neurovascularly intact.  Assessment: Chronic septic arthritis right knee with osteomyelitis. Type 1 diabetes with last A1c 6.5.  Plan: I discussed the findings with the patient.  This is an extremely challenging situation.  She has chronic right knee NSA with osteomyelitis and significant bone loss.  I recommend debridement of the right knee with placement of articulating versus static antibiotic  spacer.  We discussed the risk, benefits, and alternatives to surgery.  We discussed postoperative infectious disease consult, PICC line for prolonged IV antibiotics.  When she has completed her course of IV antibiotics, we will give her a 2-week holiday and then repeat her blood work.  She may need a repeat knee aspiration as well.  If the infection is cleared, then we can discuss resection of the spacer and  pacing a total knee arthroplasty.  If she is still positive for infection at that point, she will need repeat debridement and IV antibiotic treatment.  She understands that there is a possibility of needing a knee arthrodesis in the future.  She understands that there is also a possibility she could require an AKA.  We discussed that she will likely be unable to work for the next 6 to 12 months.  We will plan for surgery tomorrow. NPO after MN. Hold chemical DVT ppx.     Jonette Pesa, MD [phone]    11/12/2020 6:33 PM

## 2020-11-12 NOTE — Evaluation (Signed)
Clinical/Bedside Swallow Evaluation Patient Details  Name: Molly [name] MRN: [id number] Date of Birth: 1970-03-18  Today's Date: 11/12/2020 Time: SLP Start Time (ACUTE ONLY): 1110 SLP Stop Time (ACUTE ONLY): 1120 SLP Time Calculation (min) (ACUTE ONLY): 10 min  Past Medical History:  Past Medical History:  Diagnosis Date   ALLERGIC RHINITIS CAUSE UNSPECIFIED 03/06/2007   Bipolar 1 disorder (HCC)    CONTUSION, ANKLE 08/28/2007   DEPRESSION 08/03/2006   DIABETES MELLITUS, TYPE I 08/03/2006   DM retinopathy (HCC)    Encounter for long-term (current) use of other medications    Hyperlipidemia    MIGRAINE, CHRONIC 03/06/2007   Osteoporosis    SEBACEOUS CYST, INFECTED 07/13/2007   SMOKER 08/11/2009   THROMBOCYTOPENIA 05/07/2008   TINEA VERSICOLOR 07/13/2007   Past Surgical History:  Past Surgical History:  Procedure Laterality Date   Abnormal U/S  12/10/1996   ELECTROCARDIOGRAM  06/13/2006   TUBAL LIGATION     HPI:  50 yo female adm to [facility] with septic joint of right knee- diagnosed with osteomyelitis. PMH + for insomnia, bipolar d/o, DM, HLD.  On 10/19 pt noted to have AMS including dysarthria and refusal to take medications.  CT head completed which was negative for neuro changes- showed mild cerebral atrophy progressed since 2017.  Swallow eval ordered.  RN reports pt has been tolerating po well today.    Assessment / Plan / Recommendation  Clinical Impression  Pt visited while she was in bed and reporting pain level of 10 - RN arrived to give her medications with water with no evidence of dysphagia.  Cranial nerve exam unremarkabl and pt with no neuro deficit on CT head.  Pt able to self feed graham crackers (crackers given to help her tolerate po pain medications given her report of nausea x3 weeks prior to admission.  Pt also observed consuming Ensure via straw.  No indication of aspiration or dysphagia across all consistencies given.  Given pt reported issues *to RN yesterday* with  items lodging in throat-- she denies this today.  [name] reports issues with GERD at times - and takes reflux medications prn at home - in hospital per RN, she is on a PPI.  Provided pt with basic dysphagia compensation strategies for her episodic dysphagia *Likely esoph/mental status source in SLP opinion*, no follow up needed. Did not conduct 3 ounce yale due to pt's report of dizziness and nausea.  Thanks for this consult. SLP Visit Diagnosis: Dysphagia, unspecified (R13.10)    Aspiration Risk  No limitations    Diet Recommendation Regular;Thin liquid   Liquid Administration via: Straw;Cup Medication Administration: Whole meds with liquid Supervision: Patient able to self feed Compensations: Slow rate;Small sips/bites Postural Changes: Seated upright at 90 degrees    Other  Recommendations      Recommendations for follow up therapy are one component of a multi-disciplinary discharge planning process, led by the attending physician.  Recommendations may be updated based on patient status, additional functional criteria and insurance authorization.  Follow up Recommendations None      Frequency and Duration            Prognosis        Swallow Study   General Date of Onset: 11/12/20 HPI: 50 yo female adm to [facility] with septic joint of right knee- diagnosed with osteomyelitis. PMH + for insomnia, bipolar d/o, DM, HLD.  On 10/19 pt noted to have AMS including dysarthria and refusal to take medications.  CT head completed which  was negative for neuro changes- showed mild cerebral atrophy progressed since 2017.  Swallow eval ordered.  RN reports pt has been tolerating po well today. Type of Study: Bedside Swallow Evaluation Diet Prior to this Study: Regular;Thin liquids Temperature Spikes Noted: No Respiratory Status: Room air History of Recent Intubation: No Behavior/Cognition: Alert;Cooperative;Pleasant mood Oral Cavity Assessment: Within Functional Limits Oral Care Completed  by SLP: No Oral Cavity - Dentition: Adequate natural dentition Vision: Functional for self-feeding Self-Feeding Abilities: Able to feed self Patient Positioning: Partially reclined (pt with extensive knee pain) Baseline Vocal Quality: Normal Volitional Swallow: Able to elicit    Oral/Motor/Sensory Function Overall Oral Motor/Sensory Function: Within functional limits   Ice Chips Ice chips: Not tested   Thin Liquid Thin Liquid: Within functional limits Presentation: Straw    Nectar Thick Nectar Thick Liquid: Not tested   Honey Thick Honey Thick Liquid: Not tested   Puree Puree: Not tested   Solid     Solid: Within functional limits Presentation: Self Fed      Molly [name] 11/12/2020,11:32 AM

## 2020-11-12 NOTE — Progress Notes (Signed)
PROGRESS NOTE    Molly [name]  VZS:[id number] DOB: 1970/02/05 DOA: 11/09/2020 PCP: Andria Frames, PA-C   Brief Narrative: Molly [name] is a 50 y.o.  female with medical history significant of diabetes mellitus type 1, bipolar disorder, hyperlipidemia, insomnia. Patient presented secondary to a right knee septic arthritis with evidence of associated osteomyelitis on MRI.  Infectious disease and orthopedics consulted.  Orthopedics have transferred care over to their partner Dr. Lyla Glassing and patient may undergo I&D of the right knee-Per charge RN review of OR schedule, plan for surgery on 10/21.   Assessment & Plan:   Principal Problem:   Septic joint of right knee joint ([place]) Active Problems:   Type 1 diabetes mellitus (HCC)   Depression   Bipolar 1 disorder (HCC)   Nausea & vomiting   HLD (hyperlipidemia)   Unspecified severe protein-calorie malnutrition (HCC)   Protein-calorie malnutrition, severe   MSSA right knee septic arthritis and acute osteomyelitis. -Follows with Dr. Percell Miller, orthopedics as outpatient - 3 months history of right knee pain, persistent and gradually worsening along with erythema and swelling.  Initially evaluated by orthopedics, work-up including DVT work-up was negative, received intra-articular steroid injection with temporary pain relief. - S/p joint aspiration in office week prior to admission by orthopedics-reportedly grew MSSA.  Subsequently hospitalized - S/p IR aspirated 1 mL of purulent and bloody synovial fluid on 10/18, gram stain shows gram-positive cocci, reintubated for better growth. - MRI right knee shows right knee septic arthritis with extensive changes of acute osteomyelitis involving the distal femur, proximal tibia and patella along with findings suggestive of right distal thigh myositis. - CRP 5.4.  ESR 94. - ID consulted and patient now on IV cefazolin - Dr. Percell Miller, orthopedics input appreciated, transferring care over to  Dr. Lyla Glassing for further care including extensive I&D-reportedly plan for 10/21. - Knee joint aspirate from 10/18: Abundant Staph aureus.  Surveillance blood cultures x2 from 10/19: Negative to date.  Nausea/intermittent vomiting -Reports 3 weeks symptoms.  Intermittent vomiting at home but none since hospitalization.  Reports some heartburn. - Maximize PPI, as needed Maalox, supportive treatment with antiemetics and monitor. - If symptoms do not improve then may need to consider EGD at some point. - Lithium level low.  Tegretol level in process. - Feels better.  Dizziness/vertigo: - Patient reports onset since 2 nights but not really very clear.  States that she feels that she is spinning around. - On exam, no focal deficits.  Low index of suspicion for acute stroke. - CT head: No acute findings.  - Initiated meclizine as needed for vertigo.  Continues to report significant vertigo, worse with movement of head and neck and an upright position.  Vestibular rehab per PT. - Lithium level low - We will also check MRI brain, noncontrast.   Diabetes mellitus, type 1 -Hemoglobin A1C of 8.5% from 8/22. Patient initially requested to manage insulin with her pump but now her pump is not working. Novolog basal rate of 1.2 units per hour via pump. - Hemoglobin A1c 6.5 on 10/18 suggesting good outpatient control. -Started Semglee 25 units daily and Novolog 5 units TID with meals -SSI qAC/HS - CBGs labile, as low as 97 mg per DL at 9 PM last night, up to 258 this morning.  Diabetes coordinator input appreciated.  Changed SSI from moderate to sensitive fibroid hypoglycemic episodes.  Essential hypertension - Resumed amlodipine 5 Mg daily yesterday.  Blood pressure still up.  Increase amlodipine to 10 Mg  daily. - Added hydralazine as needed as well.  Anemia suspect chronic disease. Unsure if acute or chronic. No history of bleeding. Recent outside records suggesting stable hemoglobin from earlier this  month -Anemia panel reviewed: Iron 30, ferritin 301 - Stable.  Minimize lab draws as able.   Hyperlipidemia -Continue Crestor  Hypokalemia Potassium of 2.4 on admission. Resolved with potassium supplementation   Insomnia -Continue Ambien qHS   Depression Bipolar 1 disorder -Continue Tegretol, lamotrigine, lithium (doses verified with prescriber per pharmacy) and Geodon. - Lithium level low.  Tegretol level pending.  DVT prophylaxis: Lovenox Code Status:   Code Status: Full Code Family Communication: None at bedside Disposition Plan: To be determined pending surgery.  Likely will need SNF.   Consultants:  Orthopedic surgery Interventional radiology Infectious disease  Procedures:  RIGHT KNEE ASPIRATION (11/10/2020)  Antimicrobials: None    Subjective: Patient interviewed and examined along with RN at bedside.  Per RN, patient is better than yesterday.  Patient also reports feeling better than yesterday.  Nausea and vomiting have improved.  Still reports significant dizziness/feels like she is spinning, worse with movement of head and neck and when she gets up.  Objective: Vitals:   11/11/20 1150 11/11/20 1317 11/11/20 1957 11/12/20 0431  BP: (!) 155/87 (!) 186/77 (!) 183/84 (!) 153/75  Pulse: 99 (!) 107 (!) 110 100  Resp: _0 Temp: 99.1 F (37.3 C) 97.8 F (36.6 C) 97.9 F (36.6 C) 99.2 F (37.3 C)  TempSrc: Oral Oral Oral Oral  SpO2: 100%  100% 100%  Weight:      Height:        Intake/Output Summary (Last 24 hours) at 11/12/2020 1105 Last data filed at 11/12/2020 4431 Gross per 24 hour  Intake 691 ml  Output 2050 ml  Net -1359 ml   Filed Weights   11/10/20 0800  Weight: 66.3 kg    Examination:  General exam: Young female, moderately built and nourished lying comfortably propped up in bed without distress.  Looks improved compared to midday yesterday. Respiratory system: Clear to auscultation.  No increased work of  breathing. Cardiovascular system: S1 & S2 heard, RRR. No murmurs, rubs, gallops or clicks. Gastrointestinal system: Abdomen is nondistended, soft and nontender.  No organomegaly or masses appreciated.  Normal bowel sounds heard. Central nervous system: Alert and oriented with no focal deficits.  Has a nasal twang to her voice.  No nystagmus appreciated. Musculoskeletal: Right knee with diffuse swelling compared to the left, mild increased warmth, no further erythema noted, not tender to touch. Skin: No cyanosis. No rashes Psychiatry: Judgement and insight appear normal. Mood & affect appropriate.     Data Reviewed: I have personally reviewed following labs and imaging studies  CBC Lab Results  Component Value Date   WBC 5.4 11/11/2020   RBC 3.77 (L) 11/11/2020   HGB 10.1 (L) 11/11/2020   HCT 33.3 (L) 11/11/2020   MCV 88.3 11/11/2020   MCH 26.8 11/11/2020   PLT 249 11/11/2020   MCHC 30.3 11/11/2020   RDW 16.1 (H) 11/11/2020   LYMPHSABS 0.9 11/09/2020   MONOABS 0.7 11/09/2020   EOSABS 0.0 11/09/2020   BASOSABS 0.0 54/00/8676     Last metabolic panel Lab Results  Component Value Date   NA 134 (L) 11/10/2020   K 3.5 11/10/2020   CL 102 11/10/2020   CO2 25 11/10/2020   BUN 10 11/10/2020   CREATININE 0.53 11/10/2020   GLUCOSE 117 (H) 11/10/2020   GFRNONAA >  60 11/10/2020   GFRAA >60 11/23/2015   CALCIUM 8.6 (L) 11/10/2020   PHOS 2.1 (L) 11/28/2009   PROT 6.8 11/10/2020   ALBUMIN 2.8 (L) 11/10/2020   BILITOT 0.5 11/10/2020   ALKPHOS 75 11/10/2020   AST 12 (L) 11/10/2020   ALT 10 11/10/2020   ANIONGAP 7 11/10/2020    CBG (last 3)  Recent Labs    11/11/20 1643 11/11/20 2054 11/12/20 0814  GLUCAP 213* 97 258*     GFR: Estimated Creatinine Clearance: 80.9 mL/min (by C-G formula based on SCr of 0.53 mg/dL).  Coagulation Profile: No results for input(s): INR, PROTIME in the last 168 hours.  Recent Results (from the past 240 hour(s))  Resp Panel by RT-PCR (Flu  A&B, Covid) Nasopharyngeal Swab     Status: None   Collection Time: 11/09/20  9:30 PM   Specimen: Nasopharyngeal Swab; Nasopharyngeal(NP) swabs in vial transport medium  Result Value Ref Range Status   SARS Coronavirus 2 by RT PCR NEGATIVE NEGATIVE Final    Comment: (NOTE) SARS-CoV-2 target nucleic acids are NOT DETECTED.  The SARS-CoV-2 RNA is generally detectable in upper respiratory specimens during the acute phase of infection. The lowest concentration of SARS-CoV-2 viral copies this assay can detect is 138 copies/mL. A negative result does not preclude SARS-Cov-2 infection and should not be used as the sole basis for treatment or other patient management decisions. A negative result may occur with  improper specimen collection/handling, submission of specimen other than nasopharyngeal swab, presence of viral mutation(s) within the areas targeted by this assay, and inadequate number of viral copies(<138 copies/mL). A negative result must be combined with clinical observations, patient history, and epidemiological information. The expected result is Negative.  Fact Sheet for Patients:  EntrepreneurPulse.com.au  Fact Sheet for Healthcare Providers:  IncredibleEmployment.be  This test is no t yet approved or cleared by the [place] FDA and  has been authorized for detection and/or diagnosis of SARS-CoV-2 by FDA under an Emergency Use Authorization (EUA). This EUA will remain  in effect (meaning this test can be used) for the duration of the COVID-19 declaration under Section 564(b)(1) of the Act, 21 U.S.C.section 360bbb-3(b)(1), unless the authorization is terminated  or revoked sooner.       Influenza A by PCR NEGATIVE NEGATIVE Final   Influenza B by PCR NEGATIVE NEGATIVE Final    Comment: (NOTE) The Xpert Xpress SARS-CoV-2/FLU/RSV plus assay is intended as an aid in the diagnosis of influenza from Nasopharyngeal swab specimens  and should not be used as a sole basis for treatment. Nasal washings and aspirates are unacceptable for Xpert Xpress SARS-CoV-2/FLU/RSV testing.  Fact Sheet for Patients: EntrepreneurPulse.com.au  Fact Sheet for Healthcare Providers: IncredibleEmployment.be  This test is not yet approved or cleared by the [place] FDA and has been authorized for detection and/or diagnosis of SARS-CoV-2 by FDA under an Emergency Use Authorization (EUA). This EUA will remain in effect (meaning this test can be used) for the duration of the COVID-19 declaration under Section 564(b)(1) of the Act, 21 U.S.C. section 360bbb-3(b)(1), unless the authorization is terminated or revoked.  Performed at [facility], [place] [address] [place], [place] [address]   Body fluid culture w Gram Stain     Status: None (Preliminary result)   Collection Time: 11/10/20  9:10 AM   Specimen: KNEE; Body Fluid  Result Value Ref Range Status   Specimen Description   Final    KNEE RT KNEE JOINT Performed at [facility], [place] [address], [place], [place] [address]    Special Requests   Final    NONE Performed at [facility], [place] [address], [place], [place] [address]    Gram Stain   Final    MODERATE WBC PRESENT, PREDOMINANTLY PMN MODERATE GRAM POSITIVE COCCI    Culture   Final    ABUNDANT STAPHYLOCOCCUS AUREUS SUSCEPTIBILITIES TO FOLLOW Performed at [facility], [place] [address] [place], [place] [address]    Report Status PENDING  Incomplete  Anaerobic culture w Gram Stain     Status: None (Preliminary result)   Collection Time: 11/10/20  9:12 AM   Specimen: KNEE  Result Value Ref Range Status   Specimen Description   Final    KNEE RT KNEE JOINT Performed at [place] [address], [place], [place] [address]    Special Requests   Final    NONE Performed at [facility], [place] [address] [place], [place] [address]    Gram Stain   Final    MODERATE WBC PRESENT, PREDOMINANTLY PMN MODERATE GRAM POSITIVE COCCI Performed at [facility], [place] [address] [place], [place] [address]    Culture PENDING  Incomplete   Report Status PENDING  Incomplete  Culture, blood (routine x 2)     Status: None (Preliminary result)   Collection Time: 11/10/20  2:22 PM   Specimen: BLOOD  Result Value Ref Range Status   Specimen Description   Final    BLOOD RIGHT ANTECUBITAL Performed at [place] [address] [place], [place] [address]    Special Requests   Final    BOTTLES DRAWN AEROBIC AND ANAEROBIC Blood Culture adequate volume Performed at [place] [address] [place], [place] [address]    Culture   Final    NO GROWTH < 24 HOURS Performed at [place] [address] [place], [place] [address]    Report Status PENDING  Incomplete  Culture, blood (routine x 2)     Status: None (Preliminary result)   Collection Time: 11/10/20  2:22 PM   Specimen: BLOOD  Result Value Ref Range Status   Specimen Description   Final    BLOOD RIGHT ANTECUBITAL Performed at [place] [address], [place], [place] [address]    Special Requests   Final    BOTTLES DRAWN AEROBIC AND ANAEROBIC Blood Culture adequate volume Performed at [place] [address], [place], [place] [address]    Culture   Final    NO GROWTH < 24 HOURS Performed at [place] [address] [place], [place] [address]    Report Status PENDING  Incomplete         Radiology Studies: CT HEAD WO CONTRAST (5MM)  Result Date: 11/11/2020 CLINICAL DATA:  Dizziness, confusion, slurred speech no acute abnormality. EXAM: CT HEAD WITHOUT CONTRAST TECHNIQUE: Contiguous axial images were obtained from the base of the skull through the vertex without intravenous contrast.  COMPARISON:  11/23/2015 FINDINGS: Brain: Mild generalized atrophy. Negative for acute infarct, hemorrhage, mass. Vascular: Negative for hyperdense vessel Skull: Negative Sinuses/Orbits: Negative Other: None IMPRESSION: No acute abnormality. Mild cerebral atrophy with progression since 2017. Electronically Signed   By: Franchot Gallo M.D.   On: 11/11/2020 13:15   DG Knee Right Port  Result Date: 11/11/2020 CLINICAL DATA:  50 year old female with septic arthritis, osteomyelitis of  the distal femur, proximal tibia, and patella. Knee aspiration yesterday, continued pain. EXAM: PORTABLE RIGHT KNEE - 1-2 VIEW COMPARISON:  MRI 11/09/2020. FINDINGS: 3 portable views of the right knee. There is cortical osteolysis along the joint surfaces of the medial and lateral compartments, possibly also the undersurface of the patella. Subsequent partial collapse of the tibial plateau. Abnormal heterogeneous bone mineralization. Regional soft tissue swelling. No soft tissue gas. IMPRESSION: Septic right knee joint with widespread articular surface osteolysis in keeping with the widespread osteomyelitis demonstrated by MRI two days ago. Electronically Signed   By: Genevie Ann M.D.   On: 11/11/2020 09:35   IR DRAIN/INJ MAJOR JOINT/BURSA  Result Date: 11/10/2020 CLINICAL DATA:  HISTORY: Right knee pain x 3 months DIAGNOSIS: Right knee pain POST PROCEDURAL DIAGNOSIS: Same SPECIMEN: Yes ANESTHESIA: Local COMPLICATIONS: None immediate EXAM: ULTRASOUND-GUIDED RIGHT KNEE ARTHROCENTESIS PROCEDURE: Using ultrasound, the right knee was examined to determine location of fluid collection. Site was marked. The site was prepped in usual sterile fashion and approximately 3 mL 1% Lidocaine was used as local anesthesia. Thereafter, a spinal needle was guided into the suprapatellar joint space and 1 mL of purulent and bloody synovial fluid was acquired. The needle was removed and site bandaged. No complications.  Patient left department in stable  condition. IMPRESSION: Technically successful RIGHT knee joint aspiration with 1 mL specimen obtained and sent for appropriate labs. Procedure performed by Pasty Spillers, PA [facility] Electronically Signed   By: Michaelle Birks M.D.   On: 11/10/2020 13:32        Scheduled Meds:  amLODipine  5 mg Oral Daily   carbamazepine  1,200 mg Oral QHS   carbamazepine  200 mg Oral q morning   enoxaparin (LOVENOX) injection  40 mg Subcutaneous Q24H   feeding supplement (GLUCERNA SHAKE)  237 mL Oral TID BM   insulin aspart  0-5 Units Subcutaneous QHS   insulin aspart  0-9 Units Subcutaneous TID WC   insulin aspart  5 Units Subcutaneous TID WC   insulin glargine-yfgn  25 Units Subcutaneous Daily   lamoTRIgine  400 mg Oral Daily   lithium carbonate  300 mg Oral QPM   multivitamin with minerals  1 tablet Oral Daily   pantoprazole  40 mg Oral Daily   rosuvastatin  40 mg Oral Daily   ziprasidone  120 mg Oral QHS   zolpidem  5 mg Oral QHS   Continuous Infusions:   ceFAZolin (ANCEF) IV 2 g (11/12/20 0808)     LOS: 3 days    Vernell Leep, MD, [place], [facility]. [facility]  To contact the attending provider between 7A-7P or the covering provider during after hours 7P-7A, please log into the web site [email or web address] and access using universal [place] password for that web site. If you do not have the password, please call the hospital operator.

## 2020-11-13 ENCOUNTER — Inpatient Hospital Stay (HOSPITAL_COMMUNITY): Payer: Managed Care, Other (non HMO) | Admitting: Anesthesiology

## 2020-11-13 ENCOUNTER — Inpatient Hospital Stay (HOSPITAL_COMMUNITY): Payer: Managed Care, Other (non HMO)

## 2020-11-13 ENCOUNTER — Encounter (HOSPITAL_COMMUNITY): Payer: Self-pay | Admitting: Family Medicine

## 2020-11-13 ENCOUNTER — Inpatient Hospital Stay: Payer: Self-pay

## 2020-11-13 ENCOUNTER — Encounter (HOSPITAL_COMMUNITY)
Admission: AD | Disposition: A | Discharge status: Skilled Nursing Facility | Payer: Self-pay | Source: Home / Self Care | Attending: Internal Medicine

## 2020-11-13 DIAGNOSIS — R42 Dizziness and giddiness: Secondary | ICD-10-CM | POA: Diagnosis not present

## 2020-11-13 DIAGNOSIS — M00061 Staphylococcal arthritis, right knee: Secondary | ICD-10-CM | POA: Diagnosis not present

## 2020-11-13 HISTORY — PX: KNEE ARTHROPLASTY: SHX992

## 2020-11-13 LAB — GLUCOSE, CAPILLARY
Glucose-Capillary: 315 mg/dL — ABNORMAL HIGH (ref 70–99)
Glucose-Capillary: 266 mg/dL — ABNORMAL HIGH (ref 70–99)
Glucose-Capillary: 189 mg/dL — ABNORMAL HIGH (ref 70–99)
Glucose-Capillary: 200 mg/dL — ABNORMAL HIGH (ref 70–99)
Glucose-Capillary: 147 mg/dL — ABNORMAL HIGH (ref 70–99)
Glucose-Capillary: 130 mg/dL — ABNORMAL HIGH (ref 70–99)
Glucose-Capillary: 148 mg/dL — ABNORMAL HIGH (ref 70–99)
Glucose-Capillary: 157 mg/dL — ABNORMAL HIGH (ref 70–99)

## 2020-11-13 LAB — POCT I-STAT, CHEM 8
BUN: 4 mg/dL — ABNORMAL LOW (ref 6–20)
BUN: 4 mg/dL — ABNORMAL LOW (ref 6–20)
BUN: 4 mg/dL — ABNORMAL LOW (ref 6–20)
Calcium, Ion: 1.29 mmol/L (ref 1.15–1.40)
Calcium, Ion: 1.3 mmol/L (ref 1.15–1.40)
Calcium, Ion: 1.26 mmol/L (ref 1.15–1.40)
Chloride: 100 mmol/L (ref 98–111)
Chloride: 99 mmol/L (ref 98–111)
Chloride: 101 mmol/L (ref 98–111)
Creatinine, Ser: 0.3 mg/dL — ABNORMAL LOW (ref 0.44–1.00)
Creatinine, Ser: 0.3 mg/dL — ABNORMAL LOW (ref 0.44–1.00)
Creatinine, Ser: 0.4 mg/dL — ABNORMAL LOW (ref 0.44–1.00)
Glucose, Bld: 208 mg/dL — ABNORMAL HIGH (ref 70–99)
Glucose, Bld: 210 mg/dL — ABNORMAL HIGH (ref 70–99)
Glucose, Bld: 132 mg/dL — ABNORMAL HIGH (ref 70–99)
HCT: 45 % (ref 36.0–46.0)
HCT: 54 % — ABNORMAL HIGH (ref 36.0–46.0)
HCT: 26 % — ABNORMAL LOW (ref 36.0–46.0)
Hemoglobin: 15.3 g/dL — ABNORMAL HIGH (ref 12.0–15.0)
Hemoglobin: 18.4 g/dL — ABNORMAL HIGH (ref 12.0–15.0)
Hemoglobin: 8.8 g/dL — ABNORMAL LOW (ref 12.0–15.0)
Potassium: 3.7 mmol/L (ref 3.5–5.1)
Potassium: 3.8 mmol/L (ref 3.5–5.1)
Potassium: 4.5 mmol/L (ref 3.5–5.1)
Sodium: 135 mmol/L (ref 135–145)
Sodium: 135 mmol/L (ref 135–145)
Sodium: 135 mmol/L (ref 135–145)
TCO2: 25 mmol/L (ref 22–32)
TCO2: 26 mmol/L (ref 22–32)
TCO2: 26 mmol/L (ref 22–32)

## 2020-11-13 LAB — CARBAMAZEPINE, FREE AND TOTAL
Carbamazepine, Free: 3.6 ug/mL (ref 0.6–4.2)
Carbamazepine, Total: 13.8 ug/mL — ABNORMAL HIGH (ref 4.0–12.0)

## 2020-11-13 LAB — ABO/RH: ABO/RH(D): A POS

## 2020-11-13 LAB — SURGICAL PCR SCREEN
MRSA, PCR: NEGATIVE
Staphylococcus aureus: POSITIVE — AB

## 2020-11-13 LAB — PREPARE RBC (CROSSMATCH)

## 2020-11-13 SURGERY — ARTHROPLASTY, KNEE, TOTAL, USING IMAGELESS COMPUTER-ASSISTED NAVIGATION
Anesthesia: General | Site: Knee | Laterality: Right

## 2020-11-13 MED ORDER — TOBRAMYCIN SULFATE 1.2 G IJ SOLR
INTRAMUSCULAR | Status: AC
  Filled 2020-11-13: qty 9.6

## 2020-11-13 MED ORDER — ROCURONIUM BROMIDE 100 MG/10ML IV SOLN
INTRAVENOUS | Status: DC | PRN
Start: 2020-11-13 — End: 2020-11-13
  Administered 2020-11-13: 20 mg via INTRAVENOUS
  Administered 2020-11-13: 40 mg via INTRAVENOUS
  Administered 2020-11-13 (×2): 30 mg via INTRAVENOUS
  Administered 2020-11-13: 20 mg via INTRAVENOUS
  Administered 2020-11-13: 40 mg via INTRAVENOUS
  Administered 2020-11-13: 20 mg via INTRAVENOUS

## 2020-11-13 MED ORDER — TRANEXAMIC ACID-NACL 1000-0.7 MG/100ML-% IV SOLN
1000.0000 mg | INTRAVENOUS | Status: AC
  Administered 2020-11-13: 1000 mg via INTRAVENOUS
  Filled 2020-11-13: qty 100

## 2020-11-13 MED ORDER — SODIUM CHLORIDE 0.9 % IV SOLN: INTRAVENOUS | Status: DC | PRN

## 2020-11-13 MED ORDER — LACTATED RINGERS IV SOLN: INTRAVENOUS | Status: DC

## 2020-11-13 MED ORDER — CHLORHEXIDINE GLUCONATE CLOTH 2 % EX PADS
6.0000 | MEDICATED_PAD | Freq: Every day | CUTANEOUS | Status: DC
  Administered 2020-11-14: 6 via TOPICAL

## 2020-11-13 MED ORDER — LIDOCAINE HCL (CARDIAC) PF 100 MG/5ML IV SOSY
PREFILLED_SYRINGE | INTRAVENOUS | Status: DC | PRN
  Administered 2020-11-13: 40 mg via INTRATRACHEAL

## 2020-11-13 MED ORDER — OXYCODONE HCL 5 MG PO TABS
5.0000 mg | ORAL_TABLET | Freq: Once | ORAL | Status: DC | PRN
Start: 2020-11-13 — End: 2020-11-13

## 2020-11-13 MED ORDER — SODIUM CHLORIDE 0.9% FLUSH: 10.0000 mL | INTRAVENOUS | Status: DC | PRN

## 2020-11-13 MED ORDER — MIDAZOLAM HCL 2 MG/2ML IJ SOLN
INTRAMUSCULAR | Status: AC
  Filled 2020-11-13: qty 2

## 2020-11-13 MED ORDER — BUPIVACAINE-EPINEPHRINE (PF) 0.25% -1:200000 IJ SOLN
INTRAMUSCULAR | Status: AC
  Filled 2020-11-13: qty 30

## 2020-11-13 MED ORDER — PROMETHAZINE HCL 25 MG/ML IJ SOLN
6.2500 mg | INTRAMUSCULAR | Status: DC | PRN
  Administered 2020-11-13: 6.25 mg via INTRAVENOUS

## 2020-11-13 MED ORDER — TRANEXAMIC ACID-NACL 1000-0.7 MG/100ML-% IV SOLN
1000.0000 mg | Freq: Once | INTRAVENOUS | Status: AC
  Administered 2020-11-14: 1000 mg via INTRAVENOUS
  Filled 2020-11-13: qty 100

## 2020-11-13 MED ORDER — PHENYLEPHRINE HCL-NACL 20-0.9 MG/250ML-% IV SOLN
INTRAVENOUS | Status: AC
  Filled 2020-11-13: qty 250

## 2020-11-13 MED ORDER — STERILE WATER FOR IRRIGATION IR SOLN
Status: DC | PRN
  Administered 2020-11-13: 2000 mL

## 2020-11-13 MED ORDER — OXYCODONE HCL 5 MG/5ML PO SOLN
5.0000 mg | Freq: Once | ORAL | Status: DC | PRN
Start: 2020-11-13 — End: 2020-11-13

## 2020-11-13 MED ORDER — KETOROLAC TROMETHAMINE 30 MG/ML IJ SOLN
INTRAMUSCULAR | Status: DC | PRN
  Administered 2020-11-13: 30 mg

## 2020-11-13 MED ORDER — ROCURONIUM BROMIDE 10 MG/ML (PF) SYRINGE
PREFILLED_SYRINGE | INTRAVENOUS | Status: AC
  Filled 2020-11-13: qty 20

## 2020-11-13 MED ORDER — CHLORHEXIDINE GLUCONATE 4 % EX LIQD
60.0000 mL | Freq: Once | CUTANEOUS | Status: AC
  Administered 2020-11-13: 4 via TOPICAL
  Filled 2020-11-13: qty 60

## 2020-11-13 MED ORDER — FENTANYL CITRATE (PF) 250 MCG/5ML IJ SOLN
INTRAMUSCULAR | Status: AC
  Filled 2020-11-13: qty 5

## 2020-11-13 MED ORDER — SODIUM CHLORIDE 0.9 % IV SOLN: INTRAVENOUS | Status: DC

## 2020-11-13 MED ORDER — PHENOL 1.4 % MT LIQD: 1.0000 | OROMUCOSAL | Status: DC | PRN

## 2020-11-13 MED ORDER — SODIUM CHLORIDE (PF) 0.9 % IJ SOLN
INTRAMUSCULAR | Status: AC
  Filled 2020-11-13: qty 10

## 2020-11-13 MED ORDER — CHLORHEXIDINE GLUCONATE 0.12 % MT SOLN
15.0000 mL | OROMUCOSAL | Status: AC
  Administered 2020-11-13: 15 mL via OROMUCOSAL

## 2020-11-13 MED ORDER — FENTANYL CITRATE PF 50 MCG/ML IJ SOSY
50.0000 ug | PREFILLED_SYRINGE | INTRAMUSCULAR | Status: DC
Start: 2020-11-13 — End: 2020-11-13
  Administered 2020-11-13: 50 ug via INTRAVENOUS
  Filled 2020-11-13: qty 2

## 2020-11-13 MED ORDER — LACTATED RINGERS IV SOLN: INTRAVENOUS | Status: DC | PRN

## 2020-11-13 MED ORDER — ACETAMINOPHEN 500 MG PO TABS
1000.0000 mg | ORAL_TABLET | Freq: Once | ORAL | Status: DC
  Filled 2020-11-13: qty 2

## 2020-11-13 MED ORDER — FENTANYL CITRATE PF 50 MCG/ML IJ SOSY
25.0000 ug | PREFILLED_SYRINGE | INTRAMUSCULAR | Status: DC | PRN
  Administered 2020-11-13: 50 ug via INTRAVENOUS

## 2020-11-13 MED ORDER — POLYETHYLENE GLYCOL 3350 17 G PO PACK: 17.0000 g | PACK | Freq: Every day | ORAL | Status: DC | PRN

## 2020-11-13 MED ORDER — METOCLOPRAMIDE HCL 5 MG/ML IJ SOLN
5.0000 mg | Freq: Three times a day (TID) | INTRAMUSCULAR | Status: DC | PRN
  Administered 2020-11-13: 5 mg via INTRAVENOUS
  Administered 2020-11-14 – 2020-11-17 (×3): 10 mg via INTRAVENOUS
  Filled 2020-11-13 (×4): qty 2

## 2020-11-13 MED ORDER — SUGAMMADEX SODIUM 200 MG/2ML IV SOLN
INTRAVENOUS | Status: DC | PRN
  Administered 2020-11-13: 200 mg via INTRAVENOUS

## 2020-11-13 MED ORDER — SODIUM CHLORIDE 0.9 % IR SOLN
Status: DC | PRN
  Administered 2020-11-13: 1000 mL

## 2020-11-13 MED ORDER — BUPIVACAINE-EPINEPHRINE 0.25% -1:200000 IJ SOLN
INTRAMUSCULAR | Status: DC | PRN
  Administered 2020-11-13: 30 mL

## 2020-11-13 MED ORDER — POVIDONE-IODINE 10 % EX SWAB
2.0000 "application " | Freq: Once | CUTANEOUS | Status: AC
  Administered 2020-11-13: 2 via TOPICAL

## 2020-11-13 MED ORDER — CEFAZOLIN SODIUM-DEXTROSE 2-4 GM/100ML-% IV SOLN
2.0000 g | INTRAVENOUS | Status: AC
  Administered 2020-11-13: 2 g via INTRAVENOUS
  Filled 2020-11-13: qty 100

## 2020-11-13 MED ORDER — DEXAMETHASONE SODIUM PHOSPHATE 10 MG/ML IJ SOLN
10.0000 mg | Freq: Once | INTRAMUSCULAR | Status: AC
  Administered 2020-11-14: 10 mg via INTRAVENOUS
  Filled 2020-11-13: qty 1

## 2020-11-13 MED ORDER — ONDANSETRON HCL 4 MG PO TABS: 4.0000 mg | ORAL_TABLET | Freq: Four times a day (QID) | ORAL | Status: DC | PRN

## 2020-11-13 MED ORDER — ONDANSETRON HCL 4 MG/2ML IJ SOLN
INTRAMUSCULAR | Status: AC
  Filled 2020-11-13: qty 2

## 2020-11-13 MED ORDER — PHENYLEPHRINE HCL-NACL 20-0.9 MG/250ML-% IV SOLN
INTRAVENOUS | Status: DC | PRN
  Administered 2020-11-13: 20 ug/min via INTRAVENOUS

## 2020-11-13 MED ORDER — TOBRAMYCIN SULFATE 1.2 G IJ SOLR
INTRAMUSCULAR | Status: DC | PRN
  Administered 2020-11-13: 2.4 g via TOPICAL

## 2020-11-13 MED ORDER — FENTANYL CITRATE (PF) 250 MCG/5ML IJ SOLN
INTRAMUSCULAR | Status: DC | PRN
  Administered 2020-11-13: 50 ug via INTRAVENOUS
  Administered 2020-11-13: 25 ug via INTRAVENOUS
  Administered 2020-11-13 (×2): 50 ug via INTRAVENOUS
  Administered 2020-11-13: 25 ug via INTRAVENOUS
  Administered 2020-11-13: 50 ug via INTRAVENOUS

## 2020-11-13 MED ORDER — SENNA 8.6 MG PO TABS
1.0000 | ORAL_TABLET | Freq: Two times a day (BID) | ORAL | Status: DC
  Administered 2020-11-15 – 2020-11-19 (×10): 8.6 mg via ORAL
  Filled 2020-11-13 (×11): qty 1

## 2020-11-13 MED ORDER — ASPIRIN 81 MG PO CHEW
81.0000 mg | CHEWABLE_TABLET | Freq: Two times a day (BID) | ORAL | Status: DC
  Administered 2020-11-14 – 2020-11-19 (×11): 81 mg via ORAL
  Filled 2020-11-13 (×12): qty 1

## 2020-11-13 MED ORDER — INSULIN REGULAR(HUMAN) IN NACL 100-0.9 UT/100ML-% IV SOLN
INTRAVENOUS | Status: DC
  Administered 2020-11-13: 4.6 [IU]/h via INTRAVENOUS
  Filled 2020-11-13: qty 100

## 2020-11-13 MED ORDER — PROPOFOL 10 MG/ML IV BOLUS
INTRAVENOUS | Status: DC | PRN
Start: 2020-11-13 — End: 2020-11-13
  Administered 2020-11-13: 80 mg via INTRAVENOUS

## 2020-11-13 MED ORDER — DOCUSATE SODIUM 100 MG PO CAPS
100.0000 mg | ORAL_CAPSULE | Freq: Two times a day (BID) | ORAL | Status: DC
  Administered 2020-11-15 – 2020-11-19 (×10): 100 mg via ORAL
  Filled 2020-11-13 (×11): qty 1

## 2020-11-13 MED ORDER — SODIUM CHLORIDE 0.9% FLUSH
10.0000 mL | Freq: Two times a day (BID) | INTRAVENOUS | Status: DC
  Administered 2020-11-14 – 2020-11-19 (×10): 10 mL

## 2020-11-13 MED ORDER — MENTHOL 3 MG MT LOZG: 1.0000 | LOZENGE | OROMUCOSAL | Status: DC | PRN

## 2020-11-13 MED ORDER — METOCLOPRAMIDE HCL 5 MG PO TABS: 5.0000 mg | ORAL_TABLET | Freq: Three times a day (TID) | ORAL | Status: DC | PRN

## 2020-11-13 MED ORDER — ALUM & MAG HYDROXIDE-SIMETH 200-200-20 MG/5ML PO SUSP
30.0000 mL | ORAL | Status: DC | PRN
  Administered 2020-11-14: 30 mL via ORAL
  Filled 2020-11-13: qty 30

## 2020-11-13 MED ORDER — FENTANYL CITRATE PF 50 MCG/ML IJ SOSY
PREFILLED_SYRINGE | INTRAMUSCULAR | Status: AC
  Filled 2020-11-13: qty 1

## 2020-11-13 MED ORDER — ONDANSETRON HCL 4 MG/2ML IJ SOLN
4.0000 mg | Freq: Four times a day (QID) | INTRAMUSCULAR | Status: DC | PRN
  Administered 2020-11-14 (×2): 4 mg via INTRAVENOUS
  Filled 2020-11-13 (×2): qty 2

## 2020-11-13 MED ORDER — PROPOFOL 10 MG/ML IV BOLUS
INTRAVENOUS | Status: AC
  Filled 2020-11-13: qty 20

## 2020-11-13 MED ORDER — DIPHENHYDRAMINE HCL 12.5 MG/5ML PO ELIX: 12.5000 mg | ORAL_SOLUTION | ORAL | Status: DC | PRN

## 2020-11-13 MED ORDER — ACETAMINOPHEN 10 MG/ML IV SOLN
INTRAVENOUS | Status: AC
  Filled 2020-11-13: qty 100

## 2020-11-13 MED ORDER — POVIDONE-IODINE 10 % EX SWAB: 2.0000 "application " | Freq: Once | CUTANEOUS | Status: DC

## 2020-11-13 MED ORDER — SODIUM CHLORIDE (PF) 0.9 % IJ SOLN
INTRAMUSCULAR | Status: DC | PRN
  Administered 2020-11-13: 30 mL

## 2020-11-13 MED ORDER — KETOROLAC TROMETHAMINE 30 MG/ML IJ SOLN
INTRAMUSCULAR | Status: AC
  Filled 2020-11-13: qty 1

## 2020-11-13 MED ORDER — MIDAZOLAM HCL 2 MG/2ML IJ SOLN
1.0000 mg | INTRAMUSCULAR | Status: DC
  Filled 2020-11-13: qty 2

## 2020-11-13 MED ORDER — ACETAMINOPHEN 10 MG/ML IV SOLN
INTRAVENOUS | Status: DC | PRN
  Administered 2020-11-13: 1000 mg via INTRAVENOUS

## 2020-11-13 MED ORDER — ONDANSETRON HCL 4 MG/2ML IJ SOLN
INTRAMUSCULAR | Status: DC | PRN
  Administered 2020-11-13: 4 mg via INTRAVENOUS

## 2020-11-13 MED ORDER — PROMETHAZINE HCL 25 MG/ML IJ SOLN
INTRAMUSCULAR | Status: AC
  Filled 2020-11-13: qty 1

## 2020-11-13 MED ORDER — CHLORHEXIDINE GLUCONATE CLOTH 2 % EX PADS: 6.0000 | MEDICATED_PAD | Freq: Every day | CUTANEOUS | Status: DC

## 2020-11-13 SURGICAL SUPPLY — 77 items
ADH SKN CLS APL DERMABOND .7 (GAUZE/BANDAGES/DRESSINGS) ×2
APL PRP STRL LF DISP 70% ISPRP (MISCELLANEOUS) ×2
BAG COUNTER SPONGE SURGICOUNT (BAG) IMPLANT
BAG SPEC THK2 15X12 ZIP CLS (MISCELLANEOUS)
BAG SPNG CNTER NS LX DISP (BAG)
BAG ZIPLOCK 12X15 (MISCELLANEOUS) IMPLANT
BATTERY INSTRU NAVIGATION (MISCELLANEOUS) ×6 IMPLANT
BLADE SAW RECIPROCATING 77.5 (BLADE) ×2 IMPLANT
BNDG CMPR MED 10X6 ELC LF (GAUZE/BANDAGES/DRESSINGS) ×1
BNDG ELASTIC 4X5.8 VLCR STR LF (GAUZE/BANDAGES/DRESSINGS) ×2 IMPLANT
BNDG ELASTIC 6X10 VLCR STRL LF (GAUZE/BANDAGES/DRESSINGS) ×1 IMPLANT
BNDG ELASTIC 6X5.8 VLCR STR LF (GAUZE/BANDAGES/DRESSINGS) ×2 IMPLANT
BTRY SRG DRVR LF (MISCELLANEOUS) ×3
CANISTER WOUND CARE 500ML ATS (WOUND CARE) ×1 IMPLANT
CEMENT BONE REFOBACIN R1X40 US (Cement) ×5 IMPLANT
CHLORAPREP W/TINT 26 (MISCELLANEOUS) ×4 IMPLANT
COVER SURGICAL LIGHT HANDLE (MISCELLANEOUS) ×2 IMPLANT
CUFF TOURN SGL QUICK 34 (TOURNIQUET CUFF) ×2
CUFF TRNQT CYL 34X4.125X (TOURNIQUET CUFF) ×1 IMPLANT
DECANTER SPIKE VIAL GLASS SM (MISCELLANEOUS) ×4 IMPLANT
DERMABOND ADVANCED (GAUZE/BANDAGES/DRESSINGS) ×2
DERMABOND ADVANCED .7 DNX12 (GAUZE/BANDAGES/DRESSINGS) ×2 IMPLANT
DRAPE INCISE IOBAN 66X45 STRL (DRAPES) ×6 IMPLANT
DRAPE SHEET LG 3/4 BI-LAMINATE (DRAPES) ×6 IMPLANT
DRAPE U-SHAPE 47X51 STRL (DRAPES) ×2 IMPLANT
DRSG AQUACEL AG ADV 3.5X10 (GAUZE/BANDAGES/DRESSINGS) ×2 IMPLANT
DRSG TEGADERM 4X4.75 (GAUZE/BANDAGES/DRESSINGS) IMPLANT
ELECT BLADE TIP CTD 4 INCH (ELECTRODE) ×2 IMPLANT
ELECT REM PT RETURN 15FT ADLT (MISCELLANEOUS) ×2 IMPLANT
EVACUATOR 1/8 PVC DRAIN (DRAIN) ×1 IMPLANT
FEMUR CMT CR STD SZ 6 RT KNEE (Joint) ×2 IMPLANT
FEMUR CMTD CR STD SZ 6 RT KNEE (Joint) IMPLANT
GAUZE SPONGE 4X4 12PLY STRL (GAUZE/BANDAGES/DRESSINGS) ×2 IMPLANT
GLOVE SRG 8 PF TXTR STRL LF DI (GLOVE) ×2 IMPLANT
GLOVE SURG ENC MOIS LTX SZ8.5 (GLOVE) ×4 IMPLANT
GLOVE SURG ENC TEXT LTX SZ7.5 (GLOVE) ×6 IMPLANT
GLOVE SURG UNDER POLY LF SZ8 (GLOVE) ×4
GLOVE SURG UNDER POLY LF SZ8.5 (GLOVE) ×2 IMPLANT
GOWN SPEC L3 XXLG W/TWL (GOWN DISPOSABLE) ×2 IMPLANT
GOWN SPEC L4 XLG W/TWL (GOWN DISPOSABLE) ×2 IMPLANT
HANDPIECE INTERPULSE COAX TIP (DISPOSABLE) ×2
HDLS TROCR DRIL PIN KNEE 75 (PIN) ×2
HOLDER FOLEY CATH W/STRAP (MISCELLANEOUS) ×2 IMPLANT
HOOD PEEL AWAY FLYTE STAYCOOL (MISCELLANEOUS) ×6 IMPLANT
JET LAVAGE IRRISEPT WOUND (IRRIGATION / IRRIGATOR)
LAVAGE JET IRRISEPT WOUND (IRRIGATION / IRRIGATOR) IMPLANT
LINER TIB ASF PS CD/3-9 11 RT (Liner) ×1 IMPLANT
MARKER SKIN DUAL TIP RULER LAB (MISCELLANEOUS) ×2 IMPLANT
NDL SAFETY ECLIPSE 18X1.5 (NEEDLE) ×1 IMPLANT
NDL SPNL 18GX3.5 QUINCKE PK (NEEDLE) ×1 IMPLANT
NEEDLE HYPO 18GX1.5 SHARP (NEEDLE) ×2
NEEDLE SPNL 18GX3.5 QUINCKE PK (NEEDLE) ×2 IMPLANT
NS IRRIG 1000ML POUR BTL (IV SOLUTION) ×2 IMPLANT
PACK TOTAL KNEE CUSTOM (KITS) ×2 IMPLANT
PADDING CAST COTTON 6X4 STRL (CAST SUPPLIES) ×2 IMPLANT
PIN DRILL HDLS TROCAR 75 4PK (PIN) IMPLANT
PROTECTOR NERVE ULNAR (MISCELLANEOUS) ×2 IMPLANT
SAW OSC TIP CART 19.5X105X1.3 (SAW) ×2 IMPLANT
SEALER BIPOLAR AQUA 6.0 (INSTRUMENTS) ×2 IMPLANT
SET HNDPC FAN SPRY TIP SCT (DISPOSABLE) ×1 IMPLANT
SET PAD KNEE POSITIONER (MISCELLANEOUS) ×2 IMPLANT
SPONGE DRAIN TRACH 4X4 STRL 2S (GAUZE/BANDAGES/DRESSINGS) IMPLANT
SUT MNCRL AB 3-0 PS2 18 (SUTURE) ×2 IMPLANT
SUT MNCRL AB 4-0 PS2 18 (SUTURE) ×2 IMPLANT
SUT MON AB 2-0 CT1 36 (SUTURE) ×3 IMPLANT
SUT STRATAFIX PDO 1 14 VIOLET (SUTURE) ×2
SUT STRATFX PDO 1 14 VIOLET (SUTURE) ×1
SUT VIC AB 1 CTX 36 (SUTURE) ×4
SUT VIC AB 1 CTX36XBRD ANBCTR (SUTURE) ×2 IMPLANT
SUT VIC AB 2-0 CT1 27 (SUTURE) ×2
SUT VIC AB 2-0 CT1 TAPERPNT 27 (SUTURE) ×1 IMPLANT
SUTURE STRATFX PDO 1 14 VIOLET (SUTURE) ×1 IMPLANT
SYR 3ML LL SCALE MARK (SYRINGE) ×2 IMPLANT
TOWER CARTRIDGE SMART MIX (DISPOSABLE) IMPLANT
TRAY FOLEY MTR SLVR 16FR STAT (SET/KITS/TRAYS/PACK) IMPLANT
TUBE SUCTION HIGH CAP CLEAR NV (SUCTIONS) ×2 IMPLANT
WATER STERILE IRR 1000ML POUR (IV SOLUTION) ×4 IMPLANT

## 2020-11-13 NOTE — Progress Notes (Signed)
[facility]  Date of Admission:  11/09/2020           Reason for visit: Follow up on septic arthritis/OM  Current antibiotics: Cefazolin 10/18-present  ASSESSMENT:    50 y.o. female admitted with:  MSSA native right knee septic arthritis and OM involving distal femur, proximal tibia, and patella Type 1 DM Protein calorie malnutrition  Recent knee aspirate cultures in the clinic grew MSSA and repeat aspirate 10/18 here with MSSA as well.  MRI with extensive findings as noted above.  Baseline ESR 94 and she is going to the OR today with Dr Lyla Glassing.  RECOMMENDATIONS:    Continue cefazolin OR today.  Will follow along post-op Glycemic control, nutrition, wound care PICC line Dr Linus Salmons will be here Monday and I am available as needed over the weekend   Principal Problem:   Septic joint of right knee joint ([place]) Active Problems:   Type 1 diabetes mellitus ([place])   Depression   Bipolar 1 disorder (HCC)   Nausea & vomiting   HLD (hyperlipidemia)   Unspecified severe protein-calorie malnutrition ([facility])   Protein-calorie malnutrition, severe    MEDICATIONS:    Scheduled Meds: . amLODipine  10 mg Oral Daily  . carbamazepine  1,200 mg Oral QHS  . carbamazepine  200 mg Oral q morning  . feeding supplement (GLUCERNA SHAKE)  237 mL Oral TID BM  . insulin aspart  0-5 Units Subcutaneous QHS  . insulin aspart  0-9 Units Subcutaneous TID WC  . insulin aspart  5 Units Subcutaneous TID WC  . insulin glargine-yfgn  25 Units Subcutaneous Daily  . lamoTRIgine  400 mg Oral Daily  . lithium carbonate  300 mg Oral QPM  . multivitamin with minerals  1 tablet Oral Daily  . pantoprazole  40 mg Oral Daily  . povidone-iodine  2 application Topical Once  . rosuvastatin  40 mg Oral Daily  . ziprasidone  120 mg Oral QHS  . zolpidem  5 mg Oral QHS   Continuous Infusions: .  ceFAZolin (ANCEF) IV 2 g (11/13/20 0815)  .  ceFAZolin (ANCEF) IV Stopped (11/13/20  0538)  . tranexamic acid Stopped (11/13/20 0332)   PRN Meds:.acetaminophen **OR** acetaminophen, alum & mag hydroxide-simeth, cyclobenzaprine, LORazepam, meclizine, ondansetron (ZOFRAN) IV **OR** ondansetron, oxyCODONE-acetaminophen  SUBJECTIVE:   24 hour events:  No acute events Going to OR today Hyperglycemic BCx negative   No new complaints.  No fevers, chills, tolerating abx.  Waiting on OR.  Review of Systems  All other systems reviewed and are negative.    OBJECTIVE:   Blood pressure (!) 169/83, pulse 97, temperature 98 F (36.7 C), temperature source Oral, resp. rate 18, height '5\' 7"'  (1.702 m), weight 66.3 kg, SpO2 100 %. Body mass index is 22.88 kg/m.  Physical Exam Constitutional:      General: She is not in acute distress.    Appearance: Normal appearance.  HENT:     Head: Normocephalic and atraumatic.  Eyes:     Extraocular Movements: Extraocular movements intact.     Conjunctiva/sclera: Conjunctivae normal.  Pulmonary:     Effort: Pulmonary effort is normal. No respiratory distress.  Musculoskeletal:     Cervical back: Normal range of motion and neck supple.  Neurological:     General: No focal deficit present.     Mental Status: She is alert and oriented to person, place, and time.  Psychiatric:        Mood  and Affect: Mood normal.        Behavior: Behavior normal.     Lab Results: Lab Results  Component Value Date   WBC 5.4 11/11/2020   HGB 10.1 (L) 11/11/2020   HCT 33.3 (L) 11/11/2020   MCV 88.3 11/11/2020   PLT 249 11/11/2020    Lab Results  Component Value Date   NA 134 (L) 11/10/2020   K 3.5 11/10/2020   CO2 25 11/10/2020   GLUCOSE 117 (H) 11/10/2020   BUN 10 11/10/2020   CREATININE 0.53 11/10/2020   CALCIUM 8.6 (L) 11/10/2020   GFRNONAA >60 11/10/2020   GFRAA >60 11/23/2015    Lab Results  Component Value Date   ALT 10 11/10/2020   AST 12 (L) 11/10/2020   ALKPHOS 75 11/10/2020   BILITOT 0.5 11/10/2020       Component  Value Date/Time   CRP 5.4 (H) 11/09/2020 1751       Component Value Date/Time   ESRSEDRATE 94 (H) 11/09/2020 1751     I have reviewed the micro and lab results in Epic.  Imaging: MR BRAIN WO CONTRAST  Result Date: 11/12/2020 CLINICAL DATA:  Dizziness EXAM: MRI HEAD WITHOUT CONTRAST TECHNIQUE: Multiplanar, multiecho pulse sequences of the brain and surrounding structures were obtained without intravenous contrast. COMPARISON:  CT head 11/11/2020 FINDINGS: Brain: No acute infarction, hemorrhage, hydrocephalus, extra-axial collection or mass lesion. Mild cortical atrophy for age. Normal white matter. Vascular: Normal arterial flow voids. Skull and upper cervical spine: No focal skeletal lesion. Sinuses/Orbits: Negative Other: None IMPRESSION: Mild cerebral atrophy.  No acute abnormality. Electronically Signed   By: Franchot Gallo M.D.   On: 11/12/2020 13:30   [place] EKG SITE RITE  Result Date: 11/13/2020 If Site Rite image not attached, placement could not be confirmed due to current cardiac rhythm.    Imaging  independently reviewed in Epic.    Raynelle High[facility] for [facility] [phone] pager 11/13/2020, 2:30 PM  I spent greater than 35 minutes with the patient including greater than 50% of time in face to face counsel of the patient and in coordination of their care.

## 2020-11-13 NOTE — Op Note (Signed)
OPERATIVE REPORT  SURGEON: Samson Frederic, MD   ASSISTANT: Barrie Dunker, PA-C.  PREOPERATIVE DIAGNOSIS: Chronic native septic arthritis with osteomyelitis, Right knee.   POSTOPERATIVE DIAGNOSIS: Chronic native septic arthritis with osteomyelitis, bone loss, and severe stiffness, Right knee.   PROCEDURE: 1. Open arthrotomy Right knee with excisional debridement of bone, cartilage, and synovium.  2. Placement of Prostalac articulating spacer right knee. 3. Application of negative pressure incisional dressing.  IMPLANTS: Zimmer Persona CR femur, size 6. Vivacit-E polyethelyene insert, size 11 mm, CR. Refobicin R bone cement with 2.4 g Tobramycin per pack.  ANESTHESIA:  General  TOURNIQUET TIME: Not utilized.  ESTIMATED BLOOD LOSS:-450 mL    ANTIBIOTICS: 2g Ancef.  SPECIMENS: Right knee synovial fluid culture swab. Right tibia bone for tissue culture.  DRAINS: Medium HV x1. Prevena negative pressure incisional dressing.  COMPLICATIONS: None   CONDITION: PACU - hemodynamically stable.   BRIEF CLINICAL NOTE: Molly [name] is a 50 y.o. female with chronic native septic arthritis of the right knee with osteomyelitis, stiffness, and bone loss.  She had an aspiration of the right knee which grew MSSA.  I was asked to take over care while the patient was in the hospital.  She was indicated for debridement of the right knee and placement of antibiotic impregnated spacer.  We discussed the risk, benefits, and alternatives.  Patient understands that she will need additional surgeries in the future which could include revision total knee arthroplasty, arthrodesis, or even above-the-knee amputation.  PROCEDURE IN DETAIL: Surgical site was marked by myself. Once inside the operative room, general anesthesia was obtained, and a foley catheter was inserted. The patient was then positioned and the lower extremity was prepped and draped in the normal sterile surgical fashion.  A time-out was  called verifying side and site of surgery. The patient received IV antibiotics within 60 minutes of beginning the procedure.   Under anesthesia, the patient had roughly a 60 degree flexion contracture with further flexion down to about 80 degrees, giving her a 20 degree arc of motion.  An anterior approach to the knee was performed utilizing a medial parapatellar arthrotomy.  Purulent fluid was tunneling into the capsule just anterior to the MCL, essentially over the area of the proximal medial bone defect.  Synovial tissue throughout the knee was chronically in flamed.  There was purulent material within the knee.  A representative swab of the joint fluid was sent for aerobic and anaerobic culture.  Radical synovectomy of the medial and lateral gutters was performed. A medial release was performed and the patellar fat pad was excised.  The articular cartilage throughout the knee was heavily damaged.  There was extensive erosion of the posterior condyles of the femur as well as the proximal tibia.  There was an uncontained defect of the proximal medial tibia.    A 5-degree distal femur valgus cut was made with an intramedullary guide.  Inspection of the patella revealed erosion of the central area of the cartilage with purulent material emanating from the bone underneath.  Freehand patellar resection was performed, and I was able to easily cut underneath the affected area of bone to fresh bone.  The proximal tibial resection was performed using an extramedullary guide. The menisci were excised. A spacer block was placed, and the extension gap was still extremely tight.  At this point, the knee was flexed and laminar spreaders were placed.  A radical posterior synovectomy was performed under direct visualization.  The spacer block was replaced,  and the extension gap was improved, but still tight.  I then resected 2 additional millimeters of bone off the distal femur.  Spacer block was replaced, and the gaps  were much more symmetrical.  The distal femur was sized using the 3-degree external rotation guide referencing the posterior femoral cortex. The appropriate 4-in-1 cutting block was pinned into place, and the remaining femoral cuts were performed, taking care to protect the MCL. The depth of resection provided viable bone.  The proximal tibia was then sized.  I curetted the medial tibial defect to a stable bony base.  Trial femur and trial polyliner were placed.  I was able to achieve full extension.  She flexed down to about 80 degrees.  There was adequate stability. The cut bony surfaces were irrigated with pulse lavage.  I enlarged the drill hole in the distal femur using a 15 mm drill.  I I also drilled the proximal tibia with a 15 mm drill bit.  On the back table, I mixed 1 pack of cement with 2.4 g of tobramycin.  I created small intramedullary stems for the distal femur and proximal tibia.  I also used cement to fill her defect in the proximal medial tibia.  Additional cement was mixed on the back table.  The CR femoral component and polyliner were loosely cemented after inserting the intramedullary cement dowels.  The knee was then brought into extension while the cement polymerized.  There was adequate stability.   The wound was copiously irrigated with Irrisept solution followed by normal saline with pulse lavage.  Marcaine solution was injected into the periarticular soft tissue.  A medium Hemovac drain was inserted into the lateral gutter.  The wound was closed in layers using #1 Vicryl and strata fix for the fascia, 2-0 Vicryl for the subcutaneous fat, 2-0 Monocryl for the deep dermal layer, and staples for the skin.  Portable Prevena dressing was placed according to manufacturer's instructions.  Suction was hooked up to 125 mmHg without any leak.  Bulky dressing was placed.  A knee immobilizer was placed.  The patient was then extubated, and transported to the recovery room in stable ondition.   Sponge, needle, and instrument counts were correct at the end of the case x2.  The patient tolerated the procedure well and there were no known complications.  Please note that a surgical assistant was a medical necessity for this procedure in order to perform it in a safe and expeditious manner. Surgical assistant was necessary to retract the ligaments and vital neurovascular structures to prevent injury to them and also necessary for proper positioning of the limb to allow for anatomic placement of the prosthesis.  POSTOPERATIVE PLAN: The patient will be readmitted to the hospitalist service.  Knee immobilizer at all times.  50% weightbearing right lower extremity with a walker.  Begin aspirin 81 mg p.o. twice daily for DVT prophylaxis x6 weeks.  Patient already has a PICC line and ID consult in place.  Follow intraoperative cultures.  She will need 6 weeks minimum of IV antibiotics.  Once she has completed her IV antibiotics, we will give her a 2-week antibiotic holiday and then repeat her inflammatory markers.  Upon discharge, house VAC suction unit will be switched for a portable Prevena suction unit.  She will need to follow-up in the office within 7 days of discharge for removal of the negative pressure incisional dressing.

## 2020-11-13 NOTE — Anesthesia Preprocedure Evaluation (Addendum)
Anesthesia Evaluation  Patient identified by MRN, date of birth, ID band Patient awake    Reviewed: Allergy & Precautions, NPO status , Patient's Chart, lab work & pertinent test results  History of Anesthesia Complications Negative for: history of anesthetic complications  Airway Mallampati: II  TM Distance: >3 FB Neck ROM: Full    Dental  (+) Dental Advisory Given   Pulmonary former smoker,    Pulmonary exam normal breath sounds clear to auscultation       Cardiovascular hypertension, Pt. on medications Normal cardiovascular exam Rhythm:Regular Rate:Normal     Neuro/Psych  Headaches, PSYCHIATRIC DISORDERS Depression Bipolar Disorder    GI/Hepatic negative GI ROS, Neg liver ROS,   Endo/Other  diabetes, Type 1, Insulin Dependent  Renal/GU negative Renal ROS  negative genitourinary   Musculoskeletal  (+) Arthritis , Septic arthritis right knee   Abdominal   Peds  Hematology  (+) anemia , Hgb 10.1   Anesthesia Other Findings Day of surgery medications reviewed with patient.  Reproductive/Obstetrics negative OB ROS                           Anesthesia Physical Anesthesia Plan  ASA: 3  Anesthesia Plan: General   Post-op Pain Management: GA combined w/ Regional for post-op pain   Induction: Intravenous  PONV Risk Score and Plan: 3 and Treatment may vary due to age or medical condition, Ondansetron, Dexamethasone and Midazolam  Airway Management Planned: Oral ETT  Additional Equipment: None  Intra-op Plan:   Post-operative Plan: Extubation in OR  Informed Consent: I have reviewed the patients History and Physical, chart, labs and discussed the procedure including the risks, benefits and alternatives for the proposed anesthesia with the patient or authorized representative who has indicated his/her understanding and acceptance.     Dental advisory given  Plan Discussed with:  CRNA  Anesthesia Plan Comments:       Anesthesia Quick Evaluation

## 2020-11-13 NOTE — Interval H&P Note (Signed)
History and Physical Interval Note:  11/13/2020 3:43 PM  Molly [name]  has presented today for surgery, with the diagnosis of septic arthritis right knee.  The various methods of treatment have been discussed with the patient and family. After consideration of risks, benefits and other options for treatment, the patient has consented to  Procedure(s) with comments: DEBRIDEMENT KNEE WITH PLACEMENT ARTICULATING ANTIBIOTIC SPACER (Right) - cultures,zimmer cement, tobramycin 2.4gm per pack of cement(will need 4) as a surgical intervention.  The patient's history has been reviewed, patient examined, no change in status, stable for surgery.  I have reviewed the patient's chart and labs.  Questions were answered to the patient's satisfaction.     Iline Oven [employer]

## 2020-11-13 NOTE — Progress Notes (Signed)
PT Cancellation Note  Patient Details Name: SISTER CARBONE MRN: [id number] DOB: 1970-12-15   Cancelled Treatment:    Reason Eval/Treat Not Completed: Medical issues which prohibited therapy, patient scheduled for surgery today.   Rada Hay 11/13/2020, 7:01 AM Blanchard Kelch PT Acute Rehabilitation Services Pager [phone] Office [phone]

## 2020-11-13 NOTE — Progress Notes (Signed)
Patient arrived to floor from PACU and received report from PACU nurse at bedside. Patient asleep. VSS. Will continue to assess.

## 2020-11-13 NOTE — Anesthesia Procedure Notes (Addendum)
Anesthesia Regional Block: Adductor canal block   Pre-Anesthetic Checklist: , timeout performed,  Correct Patient, Correct Site, Correct Laterality,  Correct Procedure, Correct Position, site marked,  Risks and benefits discussed,  Surgical consent,  Pre-op evaluation,  At surgeon's request and post-op pain management  Laterality: Lower  Prep: chloraprep       Needles:  Injection technique: Single-shot  Needle Type: Stimiplex     Needle Length: 9cm  Needle Gauge: 21     Additional Needles:   Procedures:,,,, ultrasound used (permanent image in chart),,    Narrative:  Start time: 11/13/2020 3:48 PM End time: 11/13/2020 4:10 PM Injection made incrementally with aspirations every 5 mL.  Performed by: Personally  Anesthesiologist: Lewie Loron, MD  Additional Notes: BP cuff, EKG monitors applied. Sedation begun. Artery and nerve location verified with ultrasound. Anesthetic injected incrementally (32ml), slowly, and after negative aspirations under direct u/s guidance. Good fascial/perineural spread. Tolerated well.

## 2020-11-13 NOTE — Progress Notes (Signed)
Assisted Dr. John Germeroth with Right Knee Adductor Canal block. Side rails up, monitors on throughout procedure. See vital signs in flow sheet. Tolerated Procedure well.  

## 2020-11-13 NOTE — Progress Notes (Signed)
Peripherally Inserted Central Catheter Placement  The IV Nurse has discussed with the patient and/or persons authorized to consent for the patient, the purpose of this procedure and the potential benefits and risks involved with this procedure.  The benefits include less needle sticks, lab draws from the catheter, and the patient may be discharged home with the catheter. Risks include, but not limited to, infection, bleeding, blood clot (thrombus formation), and puncture of an artery; nerve damage and irregular heartbeat and possibility to perform a PICC exchange if needed/ordered by physician.  Alternatives to this procedure were also discussed.  Bard Power PICC patient education guide, fact sheet on infection prevention and patient information card has been provided to patient /or left at bedside.    PICC Placement Documentation  PICC Single Lumen 11/13/20 Right Basilic 36 cm 0 cm (Active)  Indication for Insertion or Continuance of Line Home intravenous therapies (PICC only) 11/13/20 1500  Exposed Catheter (cm) 0 cm 11/13/20 1500  Site Assessment Clean;Dry;Intact 11/13/20 1500  Line Status Flushed;Blood return noted 11/13/20 1500  Dressing Type Transparent 11/13/20 1500  Dressing Status Clean;Dry;Intact 11/13/20 1500  Antimicrobial disc in place? Yes 11/13/20 1500  Dressing Change Due 11/20/20 11/13/20 1500       Stacie Glaze Horton 11/13/2020, 3:50 PM

## 2020-11-13 NOTE — Progress Notes (Signed)
Pharmacy Antibiotic Note  Molly [name] is a 50 y.o. female admitted on 11/09/2020 with  septic arthritis of the right knee with OM and bone loss .  Pharmacy has been consulted for cefazolin dosing.  Patient is scheduled for I&D with placement of articulating antibiotic spacer later today. Anticipate patient will need prolonged course of antibiotics requiring PICC line and OPAT consult. Antibiotics were held on admission until IR aspirate was obtained, culture growing MSSA.   Plan: Continue cefazolin 2 g IV q8h Monitor renal function  If prolonged course of antibiotics required, please place OPAT pharmacy consult once stop date is known.  Height: 5\' 7"  (170.2 cm) Weight: 66.3 kg (146 lb 1.6 oz) IBW/kg (Calculated) : 61.6  Temp (24hrs), Avg:98.5 F (36.9 C), Min:98 F (36.7 C), Max:98.9 F (37.2 C)  Recent Labs  Lab 11/09/20 1751 11/10/20 0439 11/11/20 0503  WBC 9.0 6.7 5.4  CREATININE 0.52 0.53  --   LATICACIDVEN 1.7  --   --     Estimated Creatinine Clearance: 80.9 mL/min (by C-G formula based on SCr of 0.53 mg/dL).    Allergies  Allergen Reactions   Varenicline Tartrate     REACTION: rash    Antimicrobials this admission: cefazolin 10/18 >>   Dose adjustments this admission:  Microbiology results: 10/18 BCx: ngtd 10/18 right knee joint fluid: abundant MSSA  Thank you for allowing pharmacy to be a part of this patient's care.  11/18, PharmD 11/13/2020 8:24 AM

## 2020-11-13 NOTE — Transfer of Care (Signed)
Immediate Anesthesia Transfer of Care Note  Patient: Molly [name]  Procedure(s) Performed: DEBRIDEMENT KNEE WITH PLACEMENT ARTICULATING ANTIBIOTIC SPACER (Right: Knee)  Patient Location: PACU  Anesthesia Type:General  Level of Consciousness: awake, drowsy and patient cooperative  Airway & Oxygen Therapy: Patient Spontanous Breathing and Patient connected to face mask oxygen  Post-op Assessment: Report given to RN, Post -op Vital signs reviewed and stable and Patient moving all extremities X 4  Post vital signs: stable  Last Vitals:  Vitals Value Taken Time  BP 154/78 11/13/20 2045  Temp    Pulse 107 11/13/20 2048  Resp 11 11/13/20 2048  SpO2 100 % 11/13/20 2048  Vitals shown include unvalidated device data.  Last Pain:  Vitals:   11/13/20 1544  TempSrc:   PainSc: 0-No pain      Patients Stated Pain Goal: 2 (11/09/20 1858)  Complications: No notable events documented.

## 2020-11-13 NOTE — Anesthesia Procedure Notes (Signed)
Procedure Name: Intubation Date/Time: 11/13/2020 4:40 PM Performed by: Cleda Daub, CRNA Pre-anesthesia Checklist: Patient identified, Emergency Drugs available, Suction available and Patient being monitored Patient Re-evaluated:Patient Re-evaluated prior to induction Oxygen Delivery Method: Circle system utilized Preoxygenation: Pre-oxygenation with 100% oxygen Induction Type: IV induction Ventilation: Mask ventilation without difficulty Laryngoscope Size: Mac and 3 Grade View: Grade I Tube type: Oral Tube size: 7.0 mm Number of attempts: 1 Airway Equipment and Method: Stylet and Oral airway Placement Confirmation: ETT inserted through vocal cords under direct vision, positive ETCO2 and breath sounds checked- equal and bilateral Secured at: 21 cm Tube secured with: Tape Dental Injury: Teeth and Oropharynx as per pre-operative assessment

## 2020-11-13 NOTE — Progress Notes (Addendum)
PROGRESS NOTE    Molly [name]  ZDG:[id number] DOB: May 24, 1970 DOA: 11/09/2020 PCP: Andria Frames, PA-C   Brief Narrative: Molly [name] is a 50 y.o.  female with medical history significant of diabetes mellitus type 1, bipolar disorder, hyperlipidemia, insomnia. Patient presented secondary to a right knee septic arthritis with evidence of associated osteomyelitis on MRI.  Infectious disease and orthopedics consulted.  Orthopedics plan I&D with placement of articulating versus static antibiotic spacer this afternoon.   Assessment & Plan:   Principal Problem:   Septic joint of right knee joint ([place]) Active Problems:   Type 1 diabetes mellitus (HCC)   Depression   Bipolar 1 disorder (HCC)   Nausea & vomiting   HLD (hyperlipidemia)   Unspecified severe protein-calorie malnutrition (HCC)   Protein-calorie malnutrition, severe   MSSA right knee septic arthritis and acute osteomyelitis. -Follows with Dr. Percell Miller, orthopedics as outpatient - 3 months history of right knee pain, persistent and gradually worsening along with erythema and swelling.  Initially evaluated by orthopedics, work-up including DVT work-up was negative, received intra-articular steroid injection with temporary pain relief. - S/p joint aspiration in office week prior to admission by orthopedics-reportedly grew MSSA.  Subsequently hospitalized - S/p IR aspirated 1 mL of purulent and bloody synovial fluid on 10/18, gram stain shows gram-positive cocci, reintubated for better growth. - MRI right knee shows right knee septic arthritis with extensive changes of acute osteomyelitis involving the distal femur, proximal tibia and patella along with findings suggestive of right distal thigh myositis. - CRP 5.4.  ESR 94. - ID consulted and patient now on IV cefazolin - Dr. Lyla Glassing, orthopedics input appreciated and plans I&D with placement of articulating versus static antibiotic spacer 10/21. - Knee joint aspirate  from 10/18: Abundant Staph aureus.  Surveillance blood cultures x2 from 10/19: Negative to date.  Nausea/intermittent vomiting -Reports 3 weeks symptoms.  Intermittent vomiting at home but none since hospitalization.  Reports some heartburn. - Maximize PPI, as needed Maalox, supportive treatment with antiemetics and monitor. -Improved.  Reported that she tolerated diet yesterday without nausea or vomiting. - Lithium level low.  Tegretol level in process.  Dizziness/vertigo: - Patient reports onset since 2 nights but not really very clear.  States that she feels that she is spinning around. - CT head: No acute findings.  - Initiated meclizine as needed for vertigo.  Continues to report significant vertigo, worse with movement of head and neck and an upright position.  Vestibular rehab per PT-pending. - Lithium level low -MRI brain without acute findings.  Patient is overall a poor historian, also significant overlay of anxiety, unclear regarding accuracy of her symptoms.   Diabetes mellitus, type 1 -Hemoglobin A1C of 8.5% from 8/22. Patient initially requested to manage insulin with her pump but now her pump is not working. Novolog basal rate of 1.2 units per hour via pump. - Hemoglobin A1c 6.5 on 10/18 suggesting good outpatient control. -Started Semglee 25 units daily and Novolog 5 units TID with meals -SSI qAC/HS - CBGs labile, as low as 97 mg per DL at 9 PM last night, up to 258 this morning.  Diabetes coordinator input appreciated.  Changed SSI from moderate to sensitive fibroid hypoglycemic episodes. - If tolerating oral intake well, could consider stopping Glucerna.  Essential hypertension - Resumed amlodipine 5 Mg daily yesterday.  Blood pressure still up.  Increase amlodipine to 10 Mg daily. - Added hydralazine as needed as well.  Anemia suspect chronic disease. Unsure if  acute or chronic. No history of bleeding. Recent outside records suggesting stable hemoglobin from earlier  this month -Anemia panel reviewed: Iron 30, ferritin 301 - Stable.  Minimize lab draws as able.   Hyperlipidemia -Continue Crestor  Hypokalemia Potassium of 2.4 on admission. Resolved with potassium supplementation   Insomnia -Continue Ambien qHS   Depression Bipolar 1 disorder -Continue Tegretol, lamotrigine, lithium (doses verified with prescriber per pharmacy) and Geodon. - Lithium level low.  Tegretol level pending.  DVT prophylaxis: Lovenox Code Status:   Code Status: Full Code Family Communication: I discussed in detail with patient's friend via phone, updated care and answered all questions.  She was appreciative of the call. Disposition Plan: To be determined pending surgery.  Likely will need SNF.   Consultants:  Orthopedic surgery Interventional radiology Infectious disease  Procedures:  RIGHT KNEE ASPIRATION (11/10/2020)  Antimicrobials: None    Subjective: No nausea or vomiting yesterday.  Reported that she tolerated diet.  Still has "dizziness" and confirms vertigo again.  Some cramping right lower extremity pain.  Objective: Vitals:   11/12/20 0431 11/12/20 1455 11/12/20 2043 11/13/20 0626  BP: (!) 153/75 (!) 150/80 (!) 146/76 (!) 169/83  Pulse: 100 (!) 103 (!) 102 97  Resp: '18 20 18 18  ' Temp: 99.2 F (37.3 C) 98.9 F (37.2 C) 98.6 F (37 C) 98 F (36.7 C)  TempSrc: Oral Oral Oral Oral  SpO2: 100% 99% 100% 100%  Weight:      Height:        Intake/Output Summary (Last 24 hours) at 11/13/2020 1429 Last data filed at 11/13/2020 1140 Gross per 24 hour  Intake 0 ml  Output 650 ml  Net -650 ml    Filed Weights   11/10/20 0800  Weight: 66.3 kg    Examination:  General exam: Young female, moderately built and nourished lying comfortably propped up in bed without distress.  Respiratory system: Clear to auscultation.  No increased work of breathing. Cardiovascular system: S1 and S2 heard, RRR.  No JVD, murmurs or pedal  edema. Gastrointestinal system: Abdomen is nondistended, soft and nontender.  No organomegaly or masses appreciated.  Normal bowel sounds heard. Central nervous system: Alert and oriented with no focal deficits.  Has a nasal twang to her voice.  No nystagmus appreciated. Musculoskeletal: Right knee with diffuse swelling compared to the left, mild increased warmth, no further erythema noted, not tender to touch.  The swelling overall appears slightly less compared to 2 days ago. Skin: No cyanosis. No rashes Psychiatry: Judgement and insight appear normal. Mood & affect appropriate.     Data Reviewed: I have personally reviewed following labs and imaging studies  CBC Lab Results  Component Value Date   WBC 5.4 11/11/2020   RBC 3.77 (L) 11/11/2020   HGB 10.1 (L) 11/11/2020   HCT 33.3 (L) 11/11/2020   MCV 88.3 11/11/2020   MCH 26.8 11/11/2020   PLT 249 11/11/2020   MCHC 30.3 11/11/2020   RDW 16.1 (H) 11/11/2020   LYMPHSABS 0.9 11/09/2020   MONOABS 0.7 11/09/2020   EOSABS 0.0 11/09/2020   BASOSABS 0.0 83/33/8329     Last metabolic panel Lab Results  Component Value Date   NA 134 (L) 11/10/2020   K 3.5 11/10/2020   CL 102 11/10/2020   CO2 25 11/10/2020   BUN 10 11/10/2020   CREATININE 0.53 11/10/2020   GLUCOSE 117 (H) 11/10/2020   GFRNONAA >60 11/10/2020   GFRAA >60 11/23/2015   CALCIUM 8.6 (L) 11/10/2020  PHOS 2.1 (L) 11/28/2009   PROT 6.8 11/10/2020   ALBUMIN 2.8 (L) 11/10/2020   BILITOT 0.5 11/10/2020   ALKPHOS 75 11/10/2020   AST 12 (L) 11/10/2020   ALT 10 11/10/2020   ANIONGAP 7 11/10/2020    CBG (last 3)  Recent Labs    11/13/20 0736 11/13/20 1250 11/13/20 1408  GLUCAP 315* 266* 189*     GFR: Estimated Creatinine Clearance: 80.9 mL/min (by C-G formula based on SCr of 0.53 mg/dL).  Coagulation Profile: No results for input(s): INR, PROTIME in the last 168 hours.  Recent Results (from the past 240 hour(s))  Resp Panel by RT-PCR (Flu A&B, Covid)  Nasopharyngeal Swab     Status: None   Collection Time: 11/09/20  9:30 PM   Specimen: Nasopharyngeal Swab; Nasopharyngeal(NP) swabs in vial transport medium  Result Value Ref Range Status   SARS Coronavirus 2 by RT PCR NEGATIVE NEGATIVE Final    Comment: (NOTE) SARS-CoV-2 target nucleic acids are NOT DETECTED.  The SARS-CoV-2 RNA is generally detectable in upper respiratory specimens during the acute phase of infection. The lowest concentration of SARS-CoV-2 viral copies this assay can detect is 138 copies/mL. A negative result does not preclude SARS-Cov-2 infection and should not be used as the sole basis for treatment or other patient management decisions. A negative result may occur with  improper specimen collection/handling, submission of specimen other than nasopharyngeal swab, presence of viral mutation(s) within the areas targeted by this assay, and inadequate number of viral copies(<138 copies/mL). A negative result must be combined with clinical observations, patient history, and epidemiological information. The expected result is Negative.  Fact Sheet for Patients:  EntrepreneurPulse.com.au  Fact Sheet for Healthcare Providers:  IncredibleEmployment.be  This test is no t yet approved or cleared by the [place] FDA and  has been authorized for detection and/or diagnosis of SARS-CoV-2 by FDA under an Emergency Use Authorization (EUA). This EUA will remain  in effect (meaning this test can be used) for the duration of the COVID-19 declaration under Section 564(b)(1) of the Act, 21 U.S.C.section 360bbb-3(b)(1), unless the authorization is terminated  or revoked sooner.       Influenza A by PCR NEGATIVE NEGATIVE Final   Influenza B by PCR NEGATIVE NEGATIVE Final    Comment: (NOTE) The Xpert Xpress SARS-CoV-2/FLU/RSV plus assay is intended as an aid in the diagnosis of influenza from Nasopharyngeal swab specimens and should not be  used as a sole basis for treatment. Nasal washings and aspirates are unacceptable for Xpert Xpress SARS-CoV-2/FLU/RSV testing.  Fact Sheet for Patients: EntrepreneurPulse.com.au  Fact Sheet for Healthcare Providers: IncredibleEmployment.be  This test is not yet approved or cleared by the [place] FDA and has been authorized for detection and/or diagnosis of SARS-CoV-2 by FDA under an Emergency Use Authorization (EUA). This EUA will remain in effect (meaning this test can be used) for the duration of the COVID-19 declaration under Section 564(b)(1) of the Act, 21 U.S.C. section 360bbb-3(b)(1), unless the authorization is terminated or revoked.  Performed at [facility], [place] [address], [place], [place] [address]   Body fluid culture w Gram Stain     Status: None   Collection Time: 11/10/20  9:10 AM   Specimen: KNEE; Body Fluid  Result Value Ref Range Status   Specimen Description   Final    KNEE RT KNEE JOINT Performed at [place] [address], [place], [place] [address]    Special Requests   Final  NONE Performed at [facility], [place] [address], [place], [place] [address]    Gram Stain   Final    MODERATE WBC PRESENT, PREDOMINANTLY PMN MODERATE GRAM POSITIVE COCCI Performed at [facility], [place] [address] [place], [place] [address]    Culture ABUNDANT STAPHYLOCOCCUS AUREUS  Final   Report Status 11/12/2020 FINAL  Final   Organism ID, Bacteria STAPHYLOCOCCUS AUREUS  Final      Susceptibility   Staphylococcus aureus - MIC*    CIPROFLOXACIN >=8 RESISTANT Resistant     ERYTHROMYCIN <=0.25 SENSITIVE Sensitive     GENTAMICIN <=0.5 SENSITIVE Sensitive     OXACILLIN <=0.25 SENSITIVE Sensitive     TETRACYCLINE <=1 SENSITIVE Sensitive     VANCOMYCIN <=0.5 SENSITIVE Sensitive     TRIMETH/SULFA <=10 SENSITIVE Sensitive     CLINDAMYCIN <=0.25 SENSITIVE Sensitive      RIFAMPIN <=0.5 SENSITIVE Sensitive     Inducible Clindamycin NEGATIVE Sensitive     * ABUNDANT STAPHYLOCOCCUS AUREUS  Anaerobic culture w Gram Stain     Status: None (Preliminary result)   Collection Time: 11/10/20  9:12 AM   Specimen: KNEE  Result Value Ref Range Status   Specimen Description   Final    KNEE RT KNEE JOINT Performed at [facility], [place] [address] [place], [place] [address]    Special Requests   Final    NONE Performed at [facility], [place] [address] [place], [place] [address]    Gram Stain   Final    MODERATE WBC PRESENT, PREDOMINANTLY PMN MODERATE GRAM POSITIVE COCCI Performed at [facility], [place] [address], [place], [place] [address]    Culture   Final    NO ANAEROBES ISOLATED; CULTURE IN PROGRESS FOR 5 DAYS   Report Status PENDING  Incomplete  Culture, blood (routine x 2)     Status: None (Preliminary result)   Collection Time: 11/10/20  2:22 PM   Specimen: BLOOD  Result Value Ref Range Status   Specimen Description   Final    BLOOD RIGHT ANTECUBITAL Performed at [place] [address], [place], [place] [address]    Special Requests   Final    BOTTLES DRAWN AEROBIC AND ANAEROBIC Blood Culture adequate volume Performed at [place] [address], [place], [place] [address]    Culture   Final    NO GROWTH 3 DAYS Performed at [facility], [place] [address] [place], [place] [address]    Report Status PENDING  Incomplete  Culture, blood (routine x 2)     Status: None (Preliminary result)   Collection Time: 11/10/20  2:22 PM   Specimen: BLOOD  Result Value Ref Range Status   Specimen Description   Final    BLOOD RIGHT ANTECUBITAL Performed at [place] [address], [place], [place] [address]    Special Requests   Final    BOTTLES DRAWN AEROBIC AND ANAEROBIC Blood Culture adequate volume Performed at [place] [address] [place], [place] [address]    Culture   Final    NO GROWTH 3 DAYS Performed at [facility], [place] [address], [place],  [address]    Report Status PENDING  Incomplete  Surgical pcr screen     Status: Abnormal   Collection Time: 11/13/20  6:25 AM  Result Value Ref Range Status   MRSA, PCR NEGATIVE NEGATIVE Final  Staphylococcus aureus POSITIVE (A) NEGATIVE Final    Comment: (NOTE) The Xpert SA Assay (FDA approved for NASAL specimens in patients 58 years of age and older), is one component of a comprehensive surveillance program. It is not intended to diagnose infection nor to guide or monitor treatment. Performed at [facility], [place] [address], [place], [place] [address]          Radiology Studies: MR BRAIN WO CONTRAST  Result Date: 11/12/2020 CLINICAL DATA:  Dizziness EXAM: MRI HEAD WITHOUT CONTRAST TECHNIQUE: Multiplanar, multiecho pulse sequences of the brain and surrounding structures were obtained without intravenous contrast. COMPARISON:  CT head 11/11/2020 FINDINGS: Brain: No acute infarction, hemorrhage, hydrocephalus, extra-axial collection or mass lesion. Mild cortical atrophy for age. Normal white matter. Vascular: Normal arterial flow voids. Skull and upper cervical spine: No focal skeletal lesion. Sinuses/Orbits: Negative Other: None IMPRESSION: Mild cerebral atrophy.  No acute abnormality. Electronically Signed   By: Franchot Gallo M.D.   On: 11/12/2020 13:30   [place] EKG SITE RITE  Result Date: 11/13/2020 If Site Rite image not attached, placement could not be confirmed due to current cardiac rhythm.       Scheduled Meds:  amLODipine  10 mg Oral Daily   carbamazepine  1,200 mg Oral QHS   carbamazepine  200 mg Oral q morning   feeding supplement (GLUCERNA SHAKE)  237 mL Oral TID BM   insulin aspart  0-5 Units Subcutaneous QHS   insulin aspart  0-9 Units Subcutaneous TID WC   insulin aspart  5 Units  Subcutaneous TID WC   insulin glargine-yfgn  25 Units Subcutaneous Daily   lamoTRIgine  400 mg Oral Daily   lithium carbonate  300 mg Oral QPM   multivitamin with minerals  1 tablet Oral Daily   pantoprazole  40 mg Oral Daily   povidone-iodine  2 application Topical Once   rosuvastatin  40 mg Oral Daily   ziprasidone  120 mg Oral QHS   zolpidem  5 mg Oral QHS   Continuous Infusions:   ceFAZolin (ANCEF) IV 2 g (11/13/20 0815)    ceFAZolin (ANCEF) IV Stopped (11/13/20 0538)   tranexamic acid Stopped (11/13/20 0332)     LOS: 4 days    Vernell Leep, MD, [place], [facility]. [facility]  To contact the attending provider between 7A-7P or the covering provider during after hours 7P-7A, please log into the web site [email or web address] and access using universal [place] password for that web site. If you do not have the password, please call the hospital operator.

## 2020-11-14 DIAGNOSIS — D62 Acute posthemorrhagic anemia: Secondary | ICD-10-CM

## 2020-11-14 DIAGNOSIS — M00061 Staphylococcal arthritis, right knee: Secondary | ICD-10-CM | POA: Diagnosis not present

## 2020-11-14 DIAGNOSIS — R110 Nausea: Secondary | ICD-10-CM

## 2020-11-14 DIAGNOSIS — Z9889 Other specified postprocedural states: Secondary | ICD-10-CM

## 2020-11-14 LAB — CBC
HCT: 25.6 % — ABNORMAL LOW (ref 36.0–46.0)
HCT: 23.4 % — ABNORMAL LOW (ref 36.0–46.0)
Hemoglobin: 7.8 g/dL — ABNORMAL LOW (ref 12.0–15.0)
Hemoglobin: 7.2 g/dL — ABNORMAL LOW (ref 12.0–15.0)
MCH: 27.1 pg (ref 26.0–34.0)
MCH: 26.9 pg (ref 26.0–34.0)
MCHC: 30.5 g/dL (ref 30.0–36.0)
MCHC: 30.8 g/dL (ref 30.0–36.0)
MCV: 88.9 fL (ref 80.0–100.0)
MCV: 87.3 fL (ref 80.0–100.0)
Platelets: 263 10*3/uL (ref 150–400)
Platelets: 262 10*3/uL (ref 150–400)
RBC: 2.88 MIL/uL — ABNORMAL LOW (ref 3.87–5.11)
RBC: 2.68 MIL/uL — ABNORMAL LOW (ref 3.87–5.11)
RDW: 16.3 % — ABNORMAL HIGH (ref 11.5–15.5)
RDW: 16.5 % — ABNORMAL HIGH (ref 11.5–15.5)
WBC: 11.4 10*3/uL — ABNORMAL HIGH (ref 4.0–10.5)
WBC: 8.2 10*3/uL (ref 4.0–10.5)
nRBC: 0 % (ref 0.0–0.2)
nRBC: 0 % (ref 0.0–0.2)

## 2020-11-14 LAB — COMPREHENSIVE METABOLIC PANEL
ALT: 9 U/L (ref 0–44)
AST: 13 U/L — ABNORMAL LOW (ref 15–41)
Albumin: 2.5 g/dL — ABNORMAL LOW (ref 3.5–5.0)
Alkaline Phosphatase: 67 U/L (ref 38–126)
Anion gap: 6 (ref 5–15)
BUN: 8 mg/dL (ref 6–20)
CO2: 24 mmol/L (ref 22–32)
Calcium: 8.1 mg/dL — ABNORMAL LOW (ref 8.9–10.3)
Chloride: 103 mmol/L (ref 98–111)
Creatinine, Ser: 0.48 mg/dL (ref 0.44–1.00)
GFR, Estimated: >60 mL/min (ref >60)
Glucose, Bld: 216 mg/dL — ABNORMAL HIGH (ref 70–99)
Potassium: 4.1 mmol/L (ref 3.5–5.1)
Sodium: 133 mmol/L — ABNORMAL LOW (ref 135–145)
Total Bilirubin: 0.4 mg/dL (ref 0.3–1.2)
Total Protein: 5.7 g/dL — ABNORMAL LOW (ref 6.5–8.1)

## 2020-11-14 LAB — GLUCOSE, CAPILLARY
Glucose-Capillary: 169 mg/dL — ABNORMAL HIGH (ref 70–99)
Glucose-Capillary: 219 mg/dL — ABNORMAL HIGH (ref 70–99)
Glucose-Capillary: 192 mg/dL — ABNORMAL HIGH (ref 70–99)
Glucose-Capillary: 147 mg/dL — ABNORMAL HIGH (ref 70–99)
Glucose-Capillary: 148 mg/dL — ABNORMAL HIGH (ref 70–99)

## 2020-11-14 LAB — PREPARE RBC (CROSSMATCH)

## 2020-11-14 MED ORDER — MUPIROCIN 2 % EX OINT
1.0000 "application " | TOPICAL_OINTMENT | Freq: Two times a day (BID) | CUTANEOUS | Status: AC
  Administered 2020-11-14 – 2020-11-18 (×10): 1 via NASAL
  Filled 2020-11-14 (×3): qty 22

## 2020-11-14 MED ORDER — CHLORHEXIDINE GLUCONATE CLOTH 2 % EX PADS
6.0000 | MEDICATED_PAD | Freq: Every day | CUTANEOUS | Status: AC
  Administered 2020-11-14 – 2020-11-18 (×4): 6 via TOPICAL

## 2020-11-14 MED ORDER — METOPROLOL TARTRATE 12.5 MG HALF TABLET
12.5000 mg | ORAL_TABLET | Freq: Two times a day (BID) | ORAL | Status: DC
  Administered 2020-11-15 – 2020-11-16 (×4): 12.5 mg via ORAL
  Filled 2020-11-14 (×4): qty 1

## 2020-11-14 MED ORDER — CARBAMAZEPINE 200 MG PO TABS
1000.0000 mg | ORAL_TABLET | Freq: Every day | ORAL | Status: DC
  Administered 2020-11-15 – 2020-11-18 (×5): 1000 mg via ORAL
  Filled 2020-11-14 (×6): qty 5

## 2020-11-14 MED ORDER — HYDRALAZINE HCL 25 MG PO TABS: 25.0000 mg | ORAL_TABLET | Freq: Three times a day (TID) | ORAL | Status: DC | PRN

## 2020-11-14 MED ORDER — SCOPOLAMINE 1 MG/3DAYS TD PT72
1.0000 | MEDICATED_PATCH | TRANSDERMAL | Status: DC
  Administered 2020-11-14: 1.5 mg via TRANSDERMAL
  Filled 2020-11-14: qty 1

## 2020-11-14 MED ORDER — SODIUM CHLORIDE 0.9% IV SOLUTION: Freq: Once | INTRAVENOUS | Status: DC

## 2020-11-14 NOTE — Progress Notes (Signed)
Patient ID: Molly [name], female   DOB: November 08, 1970, 50 y.o.   MRN: [id number] Subjective: 1 Day Post-Op Procedure(s) (LRB): DEBRIDEMENT KNEE WITH PLACEMENT ARTICULATING ANTIBIOTIC SPACER (Right)    Patient reports pain as moderate however most significant issue is with PONV She has all meds ordered for this but states that Zofran hasn't helped in past.  Objective:   VITALS:   Vitals:   11/14/20 0405 11/14/20 0808  BP: (!) 154/72 (!) 180/70  Pulse: (!) 109 (!) 107  Resp: 20 20  Temp: 97.9 F (36.6 C) 99 F (37.2 C)  SpO2: 100% 100%    Neurovascular intact Incision: dressing C/D/I - post op drain in place as well as suction dressing  LABS Recent Labs    11/13/20 1727 11/13/20 1959 11/14/20 0255  HGB 15.3* 8.8* 7.8*  HCT 45.0 26.0* 25.6*  WBC  --   --  11.4*  PLT  --   --  263    Recent Labs    11/13/20 1727 11/13/20 1959 11/14/20 0255  NA 135 135 133*  K 3.7 4.5 4.1  BUN 4* 4* 8  CREATININE 0.30* 0.40* 0.48  GLUCOSE 208* 132* 216*    No results for input(s): LABPT, INR in the last 72 hours.   Assessment/Plan: 1 Day Post-Op Procedure(s) (LRB): DEBRIDEMENT KNEE WITH PLACEMENT ARTICULATING ANTIBIOTIC SPACER (Right)   Advance diet - slowly, encourage mild PO intake to help with medication needs Up with therapy - may not feel up to doing much if nausea is too much Continue ABX therapy due to infected right native knee Dressing and HV drain per [place]  PIC line will be needed for 6 weeks of IV antibiotics

## 2020-11-14 NOTE — Progress Notes (Signed)
PROGRESS NOTE    Molly [name]  KTG:[id number] DOB: Jun 22, 1970 DOA: 11/09/2020 PCP: Andria Frames, PA-C   Brief Narrative: Molly [name] is a 50 y.o.  female with medical history significant of diabetes mellitus type 1, bipolar disorder, hyperlipidemia, insomnia. Patient presented secondary to a right knee septic arthritis with evidence of associated osteomyelitis on MRI.  Infectious disease and orthopedics consulted.  Orthopedics consulted and s/p debridement of right knee with placement of articulating antibiotic spacer 10/21.   Assessment & Plan:   Principal Problem:   Septic joint of right knee joint ([place]) Active Problems:   Type 1 diabetes mellitus (HCC)   Depression   Bipolar 1 disorder (HCC)   Nausea & vomiting   HLD (hyperlipidemia)   Unspecified severe protein-calorie malnutrition (HCC)   Protein-calorie malnutrition, severe   MSSA right native knee septic arthritis and acute osteomyelitis. -Follows with Dr. Percell Miller, orthopedics as outpatient - 3 months history of right knee pain, persistent and gradually worsening along with erythema and swelling.  Initially evaluated by orthopedics, work-up including DVT work-up was negative, received intra-articular steroid injection with temporary pain relief. - S/p joint aspiration in office week prior to admission by orthopedics-reportedly grew MSSA.  Subsequently hospitalized - S/p IR aspirated 1 mL of purulent and bloody synovial fluid on 10/18, gram stain shows gram-positive cocci, reintubated for better growth. - MRI right knee shows right knee septic arthritis with extensive changes of acute osteomyelitis involving the distal femur, proximal tibia and patella along with findings suggestive of right distal thigh myositis. - CRP 5.4.  ESR 94. - ID consulted and patient now on IV cefazolin - Dr. Lyla Glassing, orthopedics input appreciated and s/p debridement of right knee with placement of articulating antibiotic spacer  10/21.. - Knee joint aspirate from 10/18: Abundant Staph aureus.  Surveillance blood cultures x2 from 10/19: Negative to date. - Orthopedics following.  Nausea/intermittent vomiting -Reports 3 weeks symptoms.  Intermittent vomiting at home but none since hospitalization.  Reports some heartburn. - Maximize PPI, as needed Maalox, supportive treatment with antiemetics and monitor. - her nausea and vomiting had improved preop but now worse postop nausea.  We will add scopolamine patch. - Lithium level low.  Tegretol level in process. - Now worse postop/postop nausea.  Will add scopolamine patch. - No DKA.  Dizziness/vertigo: - Patient reports onset since 2 nights but not really very clear.  States that she feels that she is spinning around. - CT head: No acute findings.  - Initiated meclizine as needed for vertigo.  Continues to report significant vertigo, worse with movement of head and neck and an upright position.  Vestibular rehab per PT-pending. - Lithium level low -MRI brain without acute findings.  Patient is overall a poor historian, also significant overlay of anxiety, unclear regarding accuracy of her symptoms.   Diabetes mellitus, type 1 -Hemoglobin A1C of 8.5% from 8/22. Patient initially requested to manage insulin with her pump but now her pump is not working. Novolog basal rate of 1.2 units per hour via pump. - Hemoglobin A1c 6.5 on 10/18 suggesting good outpatient control. -Started Semglee 25 units daily and Novolog 5 units TID with meals and sensitive SSI. -Improved DM control.  Essential hypertension - Resumed amlodipine 5 Mg daily yesterday.  Blood pressure still up.  Increase amlodipine to 10 Mg daily. - Add metoprolol 25 Mg twice daily and hydralazine as needed.  Acute post op blood loss anemia complicating anemia suspect chronic disease. Unsure if acute or chronic.  No history of bleeding. Recent outside records suggesting stable hemoglobin from earlier this  month -Anemia panel reviewed: Iron 30, ferritin 301 - EBL at surgery: Approximately 450 mL. - Hemoglobin dropped from 10.1 preop to 7.8 g postop.  We will recheck CBC now and daily.  Transfuse if hemoglobin 7 g or less.   Hyperlipidemia -Continue Crestor  Hypokalemia Replaced.   Insomnia -Continue Ambien qHS   Depression Bipolar 1 disorder -Continue Tegretol, lamotrigine, lithium (doses verified with prescriber per pharmacy) and Geodon. - Lithium level low.  Tegretol level, total: 13.8, free: 3.6.  We will reduce p.m. dose of Tegretol marginally.  DVT prophylaxis: Lovenox Code Status:   Code Status: Full Code Family Communication: I discussed in detail with patient's friend via phone on 10/21, updated care and answered all questions.  She was appreciative of the call. Disposition Plan: To be determined pending surgery.  Likely will need SNF.   Consultants:  Orthopedic surgery Interventional radiology Infectious disease  Procedures:  RIGHT KNEE ASPIRATION (11/10/2020)  11/13/2020: 1) Open arthrotomy Right knee with excisional debridement of bone, cartilage, and synovium. 2. Placement of Prostalac articulating spacer right knee.3. Application of negative pressure incisional dressing.    Antimicrobials: As above.   Subjective: Persistent nausea.  Some gagging.  Appears worse than preop.  Appropriate postop right knee pain.  No abdominal pain.  Still dizzy.  Objective: Vitals:   11/14/20 0405 11/14/20 0808 11/14/20 1214 11/14/20 1406  BP: (!) 154/72 (!) 180/70 (!) 164/66 (!) 180/71  Pulse: (!) 109 (!) 107 (!) 112 (!) 118  Resp: _0 Temp: 97.9 F (36.6 C) 99 F (37.2 C) 100.1 F (37.8 C) 100.3 F (37.9 C)  TempSrc: Oral Oral Oral Oral  SpO2: 100% 100% 100% 99%  Weight:      Height:        Intake/Output Summary (Last 24 hours) at 11/14/2020 1500 Last data filed at 11/14/2020 7616 Gross per 24 hour  Intake 2400 ml  Output 1450 ml  Net 950 ml     Filed Weights   11/10/20 0800 11/13/20 1501  Weight: 66.3 kg 66.3 kg    Examination:  General exam: Young female, moderately built and nourished lying comfortably propped up in bed without distress.  Looks uncomfortable though. Respiratory system: Clear to auscultation.  No increased work of breathing. Cardiovascular system: S1 and S2 heard, RRR.  No JVD, murmurs or pedal edema. Gastrointestinal system: Abdomen is nondistended, soft and nontender.  No organomegaly or masses appreciated.  Normal bowel sounds heard. Central nervous system: Alert and oriented with no focal deficits.  Has a nasal twang to her voice.  No nystagmus appreciated. Musculoskeletal: Right knee with wound VAC in place.  Now has right upper arm PICC line. Skin: No cyanosis. No rashes Psychiatry: Judgement and insight appear normal. Mood & affect appropriate.     Data Reviewed: I have personally reviewed following labs and imaging studies  CBC Lab Results  Component Value Date   WBC 11.4 (H) 11/14/2020   RBC 2.88 (L) 11/14/2020   HGB 7.8 (L) 11/14/2020   HCT 25.6 (L) 11/14/2020   MCV 88.9 11/14/2020   MCH 27.1 11/14/2020   PLT 263 11/14/2020   MCHC 30.5 11/14/2020   RDW 16.3 (H) 11/14/2020   LYMPHSABS 0.9 11/09/2020   MONOABS 0.7 11/09/2020   EOSABS 0.0 11/09/2020   BASOSABS 0.0 07/37/1062     Last metabolic panel Lab Results  Component Value Date   NA 133 (L)  11/14/2020   K 4.1 11/14/2020   CL 103 11/14/2020   CO2 24 11/14/2020   BUN 8 11/14/2020   CREATININE 0.48 11/14/2020   GLUCOSE 216 (H) 11/14/2020   GFRNONAA >60 11/14/2020   GFRAA >60 11/23/2015   CALCIUM 8.1 (L) 11/14/2020   PHOS 2.1 (L) 11/28/2009   PROT 5.7 (L) 11/14/2020   ALBUMIN 2.5 (L) 11/14/2020   BILITOT 0.4 11/14/2020   ALKPHOS 67 11/14/2020   AST 13 (L) 11/14/2020   ALT 9 11/14/2020   ANIONGAP 6 11/14/2020    CBG (last 3)  Recent Labs    11/13/20 2214 11/14/20 0805 11/14/20 1209  GLUCAP 148* 169* 219*      GFR: Estimated Creatinine Clearance: 80.9 mL/min (by C-G formula based on SCr of 0.48 mg/dL).  Coagulation Profile: No results for input(s): INR, PROTIME in the last 168 hours.  Recent Results (from the past 240 hour(s))  Resp Panel by RT-PCR (Flu A&B, Covid) Nasopharyngeal Swab     Status: None   Collection Time: 11/09/20  9:30 PM   Specimen: Nasopharyngeal Swab; Nasopharyngeal(NP) swabs in vial transport medium  Result Value Ref Range Status   SARS Coronavirus 2 by RT PCR NEGATIVE NEGATIVE Final    Comment: (NOTE) SARS-CoV-2 target nucleic acids are NOT DETECTED.  The SARS-CoV-2 RNA is generally detectable in upper respiratory specimens during the acute phase of infection. The lowest concentration of SARS-CoV-2 viral copies this assay can detect is 138 copies/mL. A negative result does not preclude SARS-Cov-2 infection and should not be used as the sole basis for treatment or other patient management decisions. A negative result may occur with  improper specimen collection/handling, submission of specimen other than nasopharyngeal swab, presence of viral mutation(s) within the areas targeted by this assay, and inadequate number of viral copies(<138 copies/mL). A negative result must be combined with clinical observations, patient history, and epidemiological information. The expected result is Negative.  Fact Sheet for Patients:  EntrepreneurPulse.com.au  Fact Sheet for Healthcare Providers:  IncredibleEmployment.be  This test is no t yet approved or cleared by the [place] FDA and  has been authorized for detection and/or diagnosis of SARS-CoV-2 by FDA under an Emergency Use Authorization (EUA). This EUA will remain  in effect (meaning this test can be used) for the duration of the COVID-19 declaration under Section 564(b)(1) of the Act, 21 U.S.C.section 360bbb-3(b)(1), unless the authorization is terminated  or revoked sooner.        Influenza A by PCR NEGATIVE NEGATIVE Final   Influenza B by PCR NEGATIVE NEGATIVE Final    Comment: (NOTE) The Xpert Xpress SARS-CoV-2/FLU/RSV plus assay is intended as an aid in the diagnosis of influenza from Nasopharyngeal swab specimens and should not be used as a sole basis for treatment. Nasal washings and aspirates are unacceptable for Xpert Xpress SARS-CoV-2/FLU/RSV testing.  Fact Sheet for Patients: EntrepreneurPulse.com.au  Fact Sheet for Healthcare Providers: IncredibleEmployment.be  This test is not yet approved or cleared by the [place] FDA and has been authorized for detection and/or diagnosis of SARS-CoV-2 by FDA under an Emergency Use Authorization (EUA). This EUA will remain in effect (meaning this test can be used) for the duration of the COVID-19 declaration under Section 564(b)(1) of the Act, 21 U.S.C. section 360bbb-3(b)(1), unless the authorization is terminated or revoked.  Performed at [facility], [place] [address], [place], [place] [address]   Body fluid culture w Gram Stain     Status: None   Collection Time:  11/10/20  9:10 AM   Specimen: KNEE; Body Fluid  Result Value Ref Range Status   Specimen Description   Final    KNEE RT KNEE JOINT Performed at [place] [address], [place], [place] [address]    Special Requests   Final    NONE Performed at [facility], [place] [address], [place], [place] [address]    Gram Stain   Final    MODERATE WBC PRESENT, PREDOMINANTLY PMN MODERATE GRAM POSITIVE COCCI Performed at [facility], [place] [address] [place], [name] [address]    Culture ABUNDANT STAPHYLOCOCCUS AUREUS  Final   Report Status 11/12/2020 FINAL  Final   Organism ID, Bacteria STAPHYLOCOCCUS AUREUS  Final      Susceptibility   Staphylococcus aureus - MIC*    CIPROFLOXACIN >=8 RESISTANT Resistant     ERYTHROMYCIN <=0.25  SENSITIVE Sensitive     GENTAMICIN <=0.5 SENSITIVE Sensitive     OXACILLIN <=0.25 SENSITIVE Sensitive     TETRACYCLINE <=1 SENSITIVE Sensitive     VANCOMYCIN <=0.5 SENSITIVE Sensitive     TRIMETH/SULFA <=10 SENSITIVE Sensitive     CLINDAMYCIN <=0.25 SENSITIVE Sensitive     RIFAMPIN <=0.5 SENSITIVE Sensitive     Inducible Clindamycin NEGATIVE Sensitive     * ABUNDANT STAPHYLOCOCCUS AUREUS  Anaerobic culture w Gram Stain     Status: None (Preliminary result)   Collection Time: 11/10/20  9:12 AM   Specimen: KNEE  Result Value Ref Range Status   Specimen Description   Final    KNEE RT KNEE JOINT Performed at [facility], [place] [address], [place], [place] [address]    Special Requests   Final    NONE Performed at [facility], [place] [address], [place], [place] [address]    Gram Stain   Final    MODERATE WBC PRESENT, PREDOMINANTLY PMN MODERATE GRAM POSITIVE COCCI Performed at [facility], [place] [address] [place], [place] [address]    Culture   Final    NO ANAEROBES ISOLATED; CULTURE IN PROGRESS FOR 5 DAYS   Report Status PENDING  Incomplete  Culture, blood (routine x 2)     Status: None (Preliminary result)   Collection Time: 11/10/20  2:22 PM   Specimen: BLOOD  Result Value Ref Range Status   Specimen Description   Final    BLOOD RIGHT ANTECUBITAL Performed at [place] [address], [place], [place] [address]    Special Requests   Final    BOTTLES DRAWN AEROBIC AND ANAEROBIC Blood Culture adequate volume Performed at [place] [address] [place], [place] [address]    Culture   Final    NO GROWTH 4 DAYS Performed at [facility], [place] [address], [place], [place] [address]    Report Status PENDING  Incomplete  Culture, blood (routine x 2)     Status: None (Preliminary result)   Collection Time: 11/10/20  2:22 PM   Specimen: BLOOD  Result Value Ref Range Status   Specimen  Description   Final    BLOOD RIGHT ANTECUBITAL Performed at [place] [address], [place], [place] [address]    Special Requests   Final    BOTTLES DRAWN AEROBIC AND ANAEROBIC Blood Culture adequate volume Performed at [place] [address], [place], [place] [address]    Culture   Final    NO GROWTH  4 DAYS Performed at [facility], [place] [address] [place], [place] [address]    Report Status PENDING  Incomplete  Surgical pcr screen     Status: Abnormal   Collection Time: 11/13/20  6:25 AM  Result Value Ref Range Status   MRSA, PCR NEGATIVE NEGATIVE Final   Staphylococcus aureus POSITIVE (A) NEGATIVE Final    Comment: (NOTE) The Xpert SA Assay (FDA approved for NASAL specimens in patients 11 years of age and older), is one component of a comprehensive surveillance program. It is not intended to diagnose infection nor to guide or monitor treatment. Performed at [facility], [place] [address], [place], [place] [address]   Aerobic/Anaerobic Culture w Gram Stain (surgical/deep wound)     Status: None (Preliminary result)   Collection Time: 11/13/20  5:31 PM   Specimen: Wound  Result Value Ref Range Status   Specimen Description JOINT FLUID RIGHT KNEE  Final   Special Requests SWABS  Final   Gram Stain   Final    NO SQUAMOUS EPITHELIAL CELLS SEEN FEW WBC SEEN NO ORGANISMS SEEN Performed at [facility], [address], [place], [place] [address]    Culture PENDING  Incomplete   Report Status PENDING  Incomplete  Aerobic/Anaerobic Culture w Gram Stain (surgical/deep wound)     Status: None (Preliminary result)   Collection Time: 11/13/20  5:48 PM   Specimen: Bone; Tissue  Result Value Ref Range Status   Specimen Description   Final    BONE RIGHT PROXIMAL TIBIA Performed at [facility], [place] [address], [place], [place] [address]    Special Requests   Final    NONE Performed  at [facility], [place] [address], [place], [place] [address]    Gram Stain   Final    NO SQUAMOUS EPITHELIAL CELLS SEEN FEW WBC SEEN NO ORGANISMS SEEN Performed at [facility], [place] [address] [place], [place] [address]    Culture PENDING  Incomplete   Report Status PENDING  Incomplete         Radiology Studies: DG Knee Right Port  Result Date: 11/13/2020 CLINICAL DATA:  Postop EXAM: PORTABLE RIGHT KNEE - 1-2 VIEW COMPARISON:  11/11/2020 FINDINGS: Interval debridement of right knee with placement of right femoral prosthetic and proximal tibial spacer device. No fracture seen. Placement of suprapatellar drainage catheter. IMPRESSION: Interval postsurgical changes of the right knee Electronically Signed   By: Donavan Foil M.D.   On: 11/13/2020 21:05   [place] EKG SITE RITE  Result Date: 11/13/2020 If Site Rite image not attached, placement could not be confirmed due to current cardiac rhythm.       Scheduled Meds:  amLODipine  10 mg Oral Daily   aspirin  81 mg Oral BID   carbamazepine  1,200 mg Oral QHS   carbamazepine  200 mg Oral q morning   Chlorhexidine Gluconate Cloth  6 each Topical Daily   Chlorhexidine Gluconate Cloth  6 each Topical Daily   Chlorhexidine Gluconate Cloth  6 each Topical Q0600   docusate sodium  100 mg Oral BID   feeding supplement (GLUCERNA SHAKE)  237 mL Oral TID BM   insulin aspart  0-5 Units Subcutaneous QHS   insulin aspart  0-9 Units Subcutaneous TID WC   insulin aspart  5 Units Subcutaneous TID WC   insulin glargine-yfgn  25 Units Subcutaneous Daily   lamoTRIgine  400 mg Oral Daily   lithium  carbonate  300 mg Oral QPM   multivitamin with minerals  1 tablet Oral Daily   mupirocin ointment  1 application Nasal BID   pantoprazole  40 mg Oral Daily   rosuvastatin  40 mg Oral Daily   senna  1 tablet Oral BID   sodium chloride flush  10-40 mL Intracatheter Q12H   ziprasidone  120 mg Oral QHS   zolpidem  5 mg Oral QHS    Continuous Infusions:  sodium chloride 150 mL/hr at 11/14/20 1342    ceFAZolin (ANCEF) IV 2 g (11/14/20 1004)     LOS: 5 days    Vernell Leep, MD, [place], [facility]. [facility]  To contact the attending provider between 7A-7P or the covering provider during after hours 7P-7A, please log into the web site [email or web address] and access using universal [place] password for that web site. If you do not have the password, please call the hospital operator.

## 2020-11-14 NOTE — Evaluation (Signed)
Physical Therapy Evaluation Patient Details Name: Molly [name] MRN: [id number] DOB: Jun 29, 1970 Today's Date: 11/14/2020  History of Present Illness  Molly [name] is a 50 y.o.  female with medical history significant of diabetes mellitus type 1, bipolar disorder, hyperlipidemia, insomnia. Patient presented secondary to a right knee septic arthritis with evidence of associated osteomyelitis on MRI.  S / P PPROCEDURE: 1. Open arthrotomy Right knee with excisional debridement of bone, cartilage, and synovium.   2. Placement of Prostalac articulating spacer right knee.  3. Application of negative pressure incisional dressing on 11/13/20  Clinical Impression  The patient is  willing to  sit up on bed edge if tolerated. Patient  able to self assist moving RLE(In KI)  to bed edge, therapist supported along with patient. Patient did  stand with mod assistance of 2 at [facility] and scooted a few feet along bed. Patient did not have right foot on the ground. Assisted back to sitting and into bed, requiring mod assistance with right leg support.  Patient reports several months of debility due to right knee.   Patient reports that her family pushes her in her house in a transport chair. Patient has to been ambulating into BR with crutches(chair does not fit).  Patient reports that to roll from room to room, she has to have right knee flexed to make corners. Patient describes going up/down steps scooting on her buttocks with someone supporting the right leg.  Patient  would like to return home with family support.  Broached with patient option for SNF but patient is to have another surgery in  ` 6 weeks. Will work with patient on short distance gait , she may be able to tolerate more ambulation now, with surgical intervention. Also address if she will be able to continue to mobilize in a transport chair as it has no elevating leg rest.  Patient quite anxious at times, frequent dry heaves and reports feeling  Dizzy.  BP-sup-155/71/108 HR, sitting 135/75 and 138/76/ 128 HR max standing.  Patient also reports that when she feels dizzy, her arms  become weak and tingling and she cannot use them. This was not noted  during therapy.  Pt admitted with above diagnosis.  Pt currently with functional limitations due to the deficits listed below (see PT Problem List). Pt will benefit from skilled PT to increase their independence and safety with mobility to allow discharge to the venue listed below.      Recommendations for follow up therapy are one component of a multi-disciplinary discharge planning process, led by the attending physician.  Recommendations may be updated based on patient status, additional functional criteria and insurance authorization.  Follow Up Recommendations Home health PT    Equipment Recommendations  Rolling walker with 5" wheels    Recommendations for Other Services       Precautions / Restrictions Precautions Required Braces or Orthoses: Knee Immobilizer - Right Knee Immobilizer - Right: On at all times Restrictions Weight Bearing Restrictions: Yes RLE Weight Bearing: Partial weight bearing RLE Partial Weight Bearing Percentage or Pounds: 50      Mobility  Bed Mobility Overal bed mobility: Needs Assistance Bed Mobility: Supine to Sit;Sit to Supine     Supine to sit: Mod assist Sit to supine: Mod assist   General bed mobility comments: patient  moved right leg  toward bed edge, therapist supported while lowering  foot to floor, Patient used bed rail to turn on bed. Patient required support of right leg  to place back onto bed.    Transfers Overall transfer level: Needs assistance Equipment used: Rolling walker (2 wheeled) Transfers: Sit to/from Stand Sit to Stand: Mod assist;+2 physical assistance;+2 safety/equipment         General transfer comment: cues for hand placement and PWB. Patient did not place weight on rt. leg when standing. Able to scoot on  left foot  and slightly hop to move toward HOB while standing at RW. Could not make it to recliner, dizzy and nausea. Cues for sitting  down support of right leg  Ambulation/Gait                Stairs            Wheelchair Mobility    Modified Rankin (Stroke Patients Only)       Balance Overall balance assessment: Needs assistance Sitting-balance support: Feet supported;Bilateral upper extremity supported Sitting balance-Leahy Scale: Fair Sitting balance - Comments: supports on UE's-guarding   Standing balance support: During functional activity;Bilateral upper extremity supported Standing balance-Leahy Scale: Poor Standing balance comment: reliant on RW                             Pertinent Vitals/Pain Pain Score: 8  Pain Location: right knee Pain Descriptors / Indicators: Aching;Grimacing Pain Intervention(s): Monitored during session;Premedicated before session;Limited activity within patient's tolerance;Repositioned    Home Living Family/patient expects to be discharged to:: Private residence Living Arrangements: Parent;Children Available Help at Discharge: Family;Available 24 hours/day Type of Home: House Home Access: Stairs to enter Entrance Stairs-Rails: None Entrance Stairs-Number of Steps: 7- a door opens out. Home Layout: One level Home Equipment: Crutches;Transport chair      Prior Function Level of Independence: Needs assistance   Gait / Transfers Assistance Needed: Patient reports  uses transprt chair for mobility except  has to use crutches  to get into BR, Described going up the steps on her bottom w/ someone supporting her  RLE, then gets into WC at top.  ADL's / Homemaking Assistance Needed: sponge baths x several months.        Hand Dominance        Extremity/Trunk Assessment   Upper Extremity Assessment Upper Extremity Assessment: Overall WFL for tasks assessed (somewhat tremulous)    Lower Extremity  Assessment Lower Extremity Assessment: RLE deficits/detail RLE Deficits / Details: in KI, Patient  lifts leg with UE's to move leg    Cervical / Trunk Assessment Cervical / Trunk Assessment: Normal  Communication      Cognition Arousal/Alertness: Awake/alert Behavior During Therapy: Restless;Anxious Overall Cognitive Status: Within Functional Limits for tasks assessed                                 General Comments: frequently has dry heaves and states" I am dizzy."      General Comments      Exercises     Assessment/Plan    PT Assessment Patient needs continued PT services  PT Problem List Decreased strength;Decreased mobility;Decreased safety awareness;Decreased range of motion;Decreased knowledge of precautions;Decreased activity tolerance;Decreased balance;Cardiopulmonary status limiting activity;Pain       PT Treatment Interventions DME instruction;Therapeutic activities;Gait training;Therapeutic exercise;Patient/family education;Functional mobility training    PT Goals (Current goals can be found in the Care Plan section)  Acute Rehab PT Goals Patient Stated Goal: to go home PT Goal Formulation: With patient Time For Goal Achievement:  11/28/20 Potential to Achieve Goals: Fair    Frequency Min 6X/week   Barriers to discharge        Co-evaluation               AM-PAC PT "6 Clicks" Mobility  Outcome Measure Help needed turning from your back to your side while in a flat bed without using bedrails?: A Lot Help needed moving from lying on your back to sitting on the side of a flat bed without using bedrails?: A Lot Help needed moving to and from a bed to a chair (including a wheelchair)?: Total Help needed standing up from a chair using your arms (e.g., wheelchair or bedside chair)?: Total Help needed to walk in hospital room?: Total Help needed climbing 3-5 steps with a railing? : Total 6 Click Score: 8    End of Session Equipment  Utilized During Treatment: Gait belt;Right knee immobilizer Activity Tolerance: Patient limited by pain;Patient limited by fatigue Patient left: in bed;with call bell/phone within reach;with bed alarm set Nurse Communication: Mobility status PT Visit Diagnosis: Unsteadiness on feet (R26.81);Muscle weakness (generalized) (M62.81);Difficulty in walking, not elsewhere classified (R26.2);Pain Pain - Right/Left: Right Pain - part of body: Knee    Time: 8016-5537 PT Time Calculation (min) (ACUTE ONLY): 30 min   Charges:   PT Evaluation $PT Eval Moderate Complexity: 1 Mod PT Treatments $Therapeutic Activity: 8-22 mins        Blanchard Kelch PT Acute Rehabilitation Services Pager [phone] Office [phone]   Rada Hay 11/14/2020, 2:23 PM

## 2020-11-15 DIAGNOSIS — M00061 Staphylococcal arthritis, right knee: Secondary | ICD-10-CM | POA: Diagnosis not present

## 2020-11-15 DIAGNOSIS — R112 Nausea with vomiting, unspecified: Secondary | ICD-10-CM | POA: Diagnosis not present

## 2020-11-15 DIAGNOSIS — D62 Acute posthemorrhagic anemia: Secondary | ICD-10-CM | POA: Diagnosis not present

## 2020-11-15 LAB — COMPREHENSIVE METABOLIC PANEL
ALT: 8 U/L (ref 0–44)
AST: 13 U/L — ABNORMAL LOW (ref 15–41)
Albumin: 2.4 g/dL — ABNORMAL LOW (ref 3.5–5.0)
Alkaline Phosphatase: 69 U/L (ref 38–126)
Anion gap: 5 (ref 5–15)
BUN: <5 mg/dL — ABNORMAL LOW (ref 6–20)
CO2: 25 mmol/L (ref 22–32)
Calcium: 8.2 mg/dL — ABNORMAL LOW (ref 8.9–10.3)
Chloride: 103 mmol/L (ref 98–111)
Creatinine, Ser: 0.42 mg/dL — ABNORMAL LOW (ref 0.44–1.00)
GFR, Estimated: >60 mL/min (ref >60)
Glucose, Bld: 215 mg/dL — ABNORMAL HIGH (ref 70–99)
Potassium: 3.6 mmol/L (ref 3.5–5.1)
Sodium: 133 mmol/L — ABNORMAL LOW (ref 135–145)
Total Bilirubin: 0.3 mg/dL (ref 0.3–1.2)
Total Protein: 5.6 g/dL — ABNORMAL LOW (ref 6.5–8.1)

## 2020-11-15 LAB — CBC
HCT: 20.9 % — ABNORMAL LOW (ref 36.0–46.0)
Hemoglobin: 6.4 g/dL — CL (ref 12.0–15.0)
MCH: 27.2 pg (ref 26.0–34.0)
MCHC: 30.6 g/dL (ref 30.0–36.0)
MCV: 88.9 fL (ref 80.0–100.0)
Platelets: 184 10*3/uL (ref 150–400)
RBC: 2.35 MIL/uL — ABNORMAL LOW (ref 3.87–5.11)
RDW: 16.7 % — ABNORMAL HIGH (ref 11.5–15.5)
WBC: 6.3 10*3/uL (ref 4.0–10.5)
nRBC: 0 % (ref 0.0–0.2)

## 2020-11-15 LAB — HEMOGLOBIN AND HEMATOCRIT, BLOOD
HCT: 31.5 % — ABNORMAL LOW (ref 36.0–46.0)
Hemoglobin: 10.3 g/dL — ABNORMAL LOW (ref 12.0–15.0)

## 2020-11-15 LAB — CULTURE, BLOOD (ROUTINE X 2)
Culture: NO GROWTH
Culture: NO GROWTH
Special Requests: ADEQUATE
Special Requests: ADEQUATE

## 2020-11-15 LAB — ANAEROBIC CULTURE W GRAM STAIN

## 2020-11-15 LAB — GLUCOSE, CAPILLARY
Glucose-Capillary: 226 mg/dL — ABNORMAL HIGH (ref 70–99)
Glucose-Capillary: 304 mg/dL — ABNORMAL HIGH (ref 70–99)
Glucose-Capillary: 169 mg/dL — ABNORMAL HIGH (ref 70–99)
Glucose-Capillary: 118 mg/dL — ABNORMAL HIGH (ref 70–99)

## 2020-11-15 LAB — PREPARE RBC (CROSSMATCH)

## 2020-11-15 MED ORDER — SODIUM CHLORIDE 0.9% IV SOLUTION: Freq: Once | INTRAVENOUS | Status: DC

## 2020-11-15 MED ORDER — HYDRALAZINE HCL 20 MG/ML IJ SOLN: 10.0000 mg | Freq: Four times a day (QID) | INTRAMUSCULAR | Status: DC | PRN

## 2020-11-15 NOTE — Progress Notes (Signed)
PROGRESS NOTE    Molly [name]  HFW:[id number] DOB: 1970/02/21 DOA: 11/09/2020 PCP: Andria Frames, PA-C   Brief Narrative: Molly [name] is a 50 y.o.  female with medical history significant of diabetes mellitus type 1, bipolar disorder, hyperlipidemia, insomnia. Patient presented secondary to a right knee septic arthritis with evidence of associated osteomyelitis on MRI.  Infectious disease and orthopedics consulted.  Orthopedics consulted and s/p debridement of right knee with placement of articulating antibiotic spacer 10/21.  Intractable nausea and vomiting.  [facility] was consulted on 10/23.  Postop acute blood loss anemia, transfusing PRBC.   Assessment & Plan:   Principal Problem:   Septic joint of right knee joint ([place]) Active Problems:   Type 1 diabetes mellitus (HCC)   Depression   Bipolar 1 disorder (HCC)   Nausea & vomiting   HLD (hyperlipidemia)   Unspecified severe protein-calorie malnutrition (HCC)   Protein-calorie malnutrition, severe   MSSA right native knee septic arthritis and acute osteomyelitis. -Follows with Dr. Percell Miller, orthopedics as outpatient - 3 months history of right knee pain, persistent and gradually worsening along with erythema and swelling.  Initially evaluated by orthopedics, work-up including DVT work-up was negative, received intra-articular steroid injection with temporary pain relief. - S/p joint aspiration in office week prior to admission by orthopedics-reportedly grew MSSA.  Subsequently hospitalized - S/p IR aspirated 1 mL of purulent and bloody synovial fluid on 10/18, gram stain shows gram-positive cocci, reintubated for better growth. - MRI right knee shows right knee septic arthritis with extensive changes of acute osteomyelitis involving the distal femur, proximal tibia and patella along with findings suggestive of right distal thigh myositis. - CRP 5.4.  ESR 94. - ID consulted and patient now on IV cefazolin - Dr.  Lyla Glassing, orthopedics input appreciated and s/p debridement of right knee with placement of articulating antibiotic spacer 10/21.. - Knee joint aspirate from 10/18: Abundant Staph aureus.  Surveillance blood cultures x2 from 10/19: Negative to date. - Orthopedics following and recommend 50% weightbearing with walker, knee immobilizer to be in place at all times.  Still has drain with bloody drainage in it.  Nausea/intermittent vomiting -Reports 3 weeks symptoms.  Intermittent vomiting at home but none since hospitalization.  Reports some heartburn. - Maximize PPI, as needed Maalox, supportive treatment with antiemetics and monitor. - her nausea and vomiting had improved preop but now worse postop nausea.  We will add scopolamine patch. - Lithium level low.  Tegretol level in process. - Now worse postop/postop nausea.  Will add scopolamine patch. - No DKA. - Ongoing intractable nausea and vomiting.  [facility] consulted and input appreciated.  DD PUD +/- partial gastric outlet obstruction, gastroparesis, systemic reaction to infection, medication side effect etc.  Planning EGD 10/24.  Dizziness/vertigo: - Patient reports onset since 2 nights but not really very clear.  States that she feels that she is spinning around. - CT head: No acute findings.  - Initiated meclizine as needed for vertigo.  Continues to report significant vertigo, worse with movement of head and neck and an upright position.  Vestibular rehab per PT-pending. - Lithium level low -MRI brain without acute findings.  Patient is overall a poor historian, also significant overlay of anxiety, unclear regarding accuracy of her symptoms.   Diabetes mellitus, type 1 -Hemoglobin A1C of 8.5% from 8/22. Patient initially requested to manage insulin with her pump but now her pump is not working. Novolog basal rate of 1.2 units per hour via pump. -  Hemoglobin A1c 6.5 on 10/18 suggesting good outpatient control. -Started Semglee 25 units  daily and Novolog 5 units TID with meals and sensitive SSI. - DM control labile.  CBGs up to 226 and 304 this morning despite getting her Semglee and NovoLog SSI plus mealtime.  She did get a dose of Decadron IV on 10/22 and may be related to this.  Continue to monitor and may need adjustment of insulins.  Essential hypertension - Amlodipine increased this admission to 10 mg daily. - Metoprolol 25 Mg twice daily added.   - BP is better but still mildly uncontrolled.  Added as needed IV hydralazine.  Acute post op blood loss anemia complicating anemia suspect chronic disease. Unsure if acute or chronic. No history of bleeding. Recent outside records suggesting stable hemoglobin from earlier this month -Anemia panel reviewed: Iron 30, ferritin 301 - EBL at surgery: Approximately 450 mL. - Hemoglobin dropped from 10.1 preop to 7.8 g postop.  Repeat check on 10/22 PM was 7.2.  1 unit of PRBC was ordered but never given for unclear reasons.  Follow-up CBC 10/23 had hemoglobin of 6.4.  Transfusing 2 units PRBC. - Follow CBC and transfuse for hemoglobin 7 g or less.   Hyperlipidemia -Continue Crestor  Hypokalemia Replaced.   Insomnia -Continue Ambien qHS   Depression Bipolar 1 disorder -Continue Tegretol, lamotrigine, lithium (doses verified with prescriber per pharmacy) and Geodon. - Lithium level low.  Tegretol level, total: 13.8, free: 3.6.  We will reduce p.m. dose of Tegretol marginally.  DVT prophylaxis: Lovenox Code Status:   Code Status: Full Code Family Communication: I discussed in detail with patient's friend via phone on 10/21, updated care and answered all questions.  She was appreciative of the call. Disposition Plan: To be determined pending surgery.  Likely will need SNF.   Consultants:  Orthopedic surgery Interventional radiology Infectious disease [facility]  Procedures:  RIGHT KNEE ASPIRATION (11/10/2020)  11/13/2020: 1) Open arthrotomy Right knee with excisional  debridement of bone, cartilage, and synovium. 2. Placement of Prostalac articulating spacer right knee.3. Application of negative pressure incisional dressing.    Antimicrobials: As above.   Subjective: "I want to see a GI doctor".  Significant issues with nausea, vomiting and poor oral intake yesterday despite multiple medication adjustments.  No abdominal pain.  Reports BM 2 days into admission and none since.  Not much right knee postop pain.  Objective: Vitals:   11/15/20 1042 11/15/20 1112 11/15/20 1412 11/15/20 1416  BP: (!) 172/69 (!) 164/71 (!) 183/73 (!) 168/87  Pulse: (!) 101 (!) 103 (!) 102   Resp:  _0 Temp: 99.3 F (37.4 C) 99.7 F (37.6 C) 98.8 F (37.1 C) 98.7 F (37.1 C)  TempSrc: Oral Oral Oral Oral  SpO2: 97% 98% 100% 100%  Weight:      Height:        Intake/Output Summary (Last 24 hours) at 11/15/2020 1435 Last data filed at 11/15/2020 1416 Gross per 24 hour  Intake 1571.33 ml  Output 1950 ml  Net -378.67 ml    Filed Weights   11/10/20 0800 11/13/20 1501  Weight: 66.3 kg 66.3 kg    Examination:  General exam: Young female, moderately built and nourished lying comfortably propped up in bed without distress.  Looks comfortable this morning compared to yesterday. Respiratory system: Clear to auscultation.  No increased work of breathing. Cardiovascular system: S1 and S2 heard, RRR.  No JVD, murmurs or pedal edema.   Gastrointestinal system:  Abdomen is nondistended, soft and nontender.  No organomegaly or masses appreciated.  Normal bowel sounds heard. Central nervous system: Alert and oriented with no focal deficits.  Has a nasal twang to her voice.  No nystagmus appreciated. Musculoskeletal: Right knee pain knee immobilizer.  Drain visible with blood in it.  Right upper arm PICC line. Skin: No cyanosis. No rashes Psychiatry: Judgement and insight appear normal. Mood & affect appropriate.     Data Reviewed: I have personally reviewed  following labs and imaging studies  CBC Lab Results  Component Value Date   WBC 6.3 11/15/2020   RBC 2.35 (L) 11/15/2020   HGB 6.4 (LL) 11/15/2020   HCT 20.9 (L) 11/15/2020   MCV 88.9 11/15/2020   MCH 27.2 11/15/2020   PLT 184 11/15/2020   MCHC 30.6 11/15/2020   RDW 16.7 (H) 11/15/2020   LYMPHSABS 0.9 11/09/2020   MONOABS 0.7 11/09/2020   EOSABS 0.0 11/09/2020   BASOSABS 0.0 50/93/2671     Last metabolic panel Lab Results  Component Value Date   NA 133 (L) 11/15/2020   K 3.6 11/15/2020   CL 103 11/15/2020   CO2 25 11/15/2020   BUN <5 (L) 11/15/2020   CREATININE 0.42 (L) 11/15/2020   GLUCOSE 215 (H) 11/15/2020   GFRNONAA >60 11/15/2020   GFRAA >60 11/23/2015   CALCIUM 8.2 (L) 11/15/2020   PHOS 2.1 (L) 11/28/2009   PROT 5.6 (L) 11/15/2020   ALBUMIN 2.4 (L) 11/15/2020   BILITOT 0.3 11/15/2020   ALKPHOS 69 11/15/2020   AST 13 (L) 11/15/2020   ALT 8 11/15/2020   ANIONGAP 5 11/15/2020    CBG (last 3)  Recent Labs    11/14/20 2158 11/15/20 0738 11/15/20 1156  GLUCAP 148* 226* 304*     GFR: Estimated Creatinine Clearance: 80.9 mL/min (A) (by C-G formula based on SCr of 0.42 mg/dL (L)).  Coagulation Profile: No results for input(s): INR, PROTIME in the last 168 hours.  Recent Results (from the past 240 hour(s))  Resp Panel by RT-PCR (Flu A&B, Covid) Nasopharyngeal Swab     Status: None   Collection Time: 11/09/20  9:30 PM   Specimen: Nasopharyngeal Swab; Nasopharyngeal(NP) swabs in vial transport medium  Result Value Ref Range Status   SARS Coronavirus 2 by RT PCR NEGATIVE NEGATIVE Final    Comment: (NOTE) SARS-CoV-2 target nucleic acids are NOT DETECTED.  The SARS-CoV-2 RNA is generally detectable in upper respiratory specimens during the acute phase of infection. The lowest concentration of SARS-CoV-2 viral copies this assay can detect is 138 copies/mL. A negative result does not preclude SARS-Cov-2 infection and should not be used as the sole basis  for treatment or other patient management decisions. A negative result may occur with  improper specimen collection/handling, submission of specimen other than nasopharyngeal swab, presence of viral mutation(s) within the areas targeted by this assay, and inadequate number of viral copies(<138 copies/mL). A negative result must be combined with clinical observations, patient history, and epidemiological information. The expected result is Negative.  Fact Sheet for Patients:  EntrepreneurPulse.com.au  Fact Sheet for Healthcare Providers:  IncredibleEmployment.be  This test is no t yet approved or cleared by the [place] FDA and  has been authorized for detection and/or diagnosis of SARS-CoV-2 by FDA under an Emergency Use Authorization (EUA). This EUA will remain  in effect (meaning this test can be used) for the duration of the COVID-19 declaration under Section 564(b)(1) of the Act, 21 U.S.C.section 360bbb-3(b)(1), unless the authorization is  terminated  or revoked sooner.       Influenza A by PCR NEGATIVE NEGATIVE Final   Influenza B by PCR NEGATIVE NEGATIVE Final    Comment: (NOTE) The Xpert Xpress SARS-CoV-2/FLU/RSV plus assay is intended as an aid in the diagnosis of influenza from Nasopharyngeal swab specimens and should not be used as a sole basis for treatment. Nasal washings and aspirates are unacceptable for Xpert Xpress SARS-CoV-2/FLU/RSV testing.  Fact Sheet for Patients: EntrepreneurPulse.com.au  Fact Sheet for Healthcare Providers: IncredibleEmployment.be  This test is not yet approved or cleared by the [place] FDA and has been authorized for detection and/or diagnosis of SARS-CoV-2 by FDA under an Emergency Use Authorization (EUA). This EUA will remain in effect (meaning this test can be used) for the duration of the COVID-19 declaration under Section 564(b)(1) of the Act, 21  U.S.C. section 360bbb-3(b)(1), unless the authorization is terminated or revoked.  Performed at [facility], [place] [address] [place], [place] [address]   Body fluid culture w Gram Stain     Status: None   Collection Time: 11/10/20  9:10 AM   Specimen: KNEE; Body Fluid  Result Value Ref Range Status   Specimen Description   Final    KNEE RT KNEE JOINT Performed at [place] [address] [place], [place] [address]    Special Requests   Final    NONE Performed at [facility], [place] [address] [place] [address] [place], [place] [address]    Gram Stain   Final    MODERATE WBC PRESENT, PREDOMINANTLY PMN MODERATE GRAM POSITIVE COCCI Performed at [facility], [place] [address] [place], [place] [address]    Culture ABUNDANT STAPHYLOCOCCUS AUREUS  Final   Report Status 11/12/2020 FINAL  Final   Organism ID, Bacteria STAPHYLOCOCCUS AUREUS  Final      Susceptibility   Staphylococcus aureus - MIC*    CIPROFLOXACIN >=8 RESISTANT Resistant     ERYTHROMYCIN <=0.25 SENSITIVE Sensitive     GENTAMICIN <=0.5 SENSITIVE Sensitive     OXACILLIN <=0.25 SENSITIVE Sensitive     TETRACYCLINE <=1 SENSITIVE Sensitive     VANCOMYCIN <=0.5 SENSITIVE Sensitive     TRIMETH/SULFA <=10 SENSITIVE Sensitive     CLINDAMYCIN <=0.25 SENSITIVE Sensitive     RIFAMPIN <=0.5 SENSITIVE Sensitive     Inducible Clindamycin NEGATIVE Sensitive     * ABUNDANT STAPHYLOCOCCUS AUREUS  Anaerobic culture w Gram Stain     Status: None (Preliminary result)   Collection Time: 11/10/20  9:12 AM   Specimen: KNEE  Result Value Ref Range Status   Specimen Description   Final    KNEE RT KNEE JOINT Performed at [facility], [place] [address] [place], [place] [address]    Special Requests   Final    NONE Performed at [facility], [place] [address], [place], [place] [address]    Gram Stain   Final    MODERATE WBC PRESENT, PREDOMINANTLY  PMN MODERATE GRAM POSITIVE COCCI Performed at [facility], [place] [address] [place], [place] [address]    Culture   Final    NO ANAEROBES ISOLATED; CULTURE IN PROGRESS FOR 5 DAYS   Report Status PENDING  Incomplete  Culture, blood (routine x 2)     Status: None   Collection Time: 11/10/20  2:22 PM   Specimen: BLOOD  Result Value Ref Range Status   Specimen Description   Final    BLOOD RIGHT  ANTECUBITAL Performed at [facility], [place] [address], [place], [place] [address]    Special Requests   Final    BOTTLES DRAWN AEROBIC AND ANAEROBIC Blood Culture adequate volume Performed at [place] [address], [place], [place] [address]    Culture   Final    NO GROWTH 5 DAYS Performed at [facility], [place] [address] [place], [place] [address]    Report Status 11/15/2020 FINAL  Final  Culture, blood (routine x 2)     Status: None   Collection Time: 11/10/20  2:22 PM   Specimen: BLOOD  Result Value Ref Range Status   Specimen Description   Final    BLOOD RIGHT ANTECUBITAL Performed at [place] [address], [place], [place] [address]    Special Requests   Final    BOTTLES DRAWN AEROBIC AND ANAEROBIC Blood Culture adequate volume Performed at [place] [address] [place], [place] [address]    Culture   Final    NO GROWTH 5 DAYS Performed at [facility], [place] [address] [place], [place] [address]    Report Status 11/15/2020 FINAL  Final  Surgical pcr screen     Status: Abnormal   Collection Time: 11/13/20  6:25 AM  Result Value Ref Range Status   MRSA, PCR NEGATIVE NEGATIVE Final   Staphylococcus aureus POSITIVE (A) NEGATIVE Final    Comment: (NOTE) The Xpert SA Assay (FDA approved for NASAL specimens in patients 4 years of age and older), is one component of a comprehensive surveillance program. It is not intended to diagnose infection nor to guide or monitor  treatment. Performed at [facility], [place] [address] [place], [place] [address]   Aerobic/Anaerobic Culture w Gram Stain (surgical/deep wound)     Status: None (Preliminary result)   Collection Time: 11/13/20  5:31 PM   Specimen: Wound  Result Value Ref Range Status   Specimen Description JOINT FLUID RIGHT KNEE  Final   Special Requests SWABS  Final   Gram Stain   Final    NO SQUAMOUS EPITHELIAL CELLS SEEN FEW WBC SEEN NO ORGANISMS SEEN    Culture   Final    CULTURE REINCUBATED FOR BETTER GROWTH Performed at [facility], [place] [address], [place], [place] [address]    Report Status PENDING  Incomplete  Aerobic/Anaerobic Culture w Gram Stain (surgical/deep wound)     Status: None (Preliminary result)   Collection Time: 11/13/20  5:48 PM   Specimen: Bone; Tissue  Result Value Ref Range Status   Specimen Description   Final    BONE RIGHT PROXIMAL TIBIA Performed at [place] [address], [place], [place] [address]    Special Requests   Final    NONE Performed at [facility], [place] [address], [place], [place] [address]    Gram Stain   Final    NO SQUAMOUS EPITHELIAL CELLS SEEN FEW WBC SEEN NO ORGANISMS SEEN    Culture   Final    CULTURE REINCUBATED FOR BETTER GROWTH Performed at [facility], [place] [address], [place], [place] [address]    Report Status PENDING  Incomplete         Radiology Studies: DG Knee Right Port  Result Date: 11/13/2020 CLINICAL DATA:  Postop EXAM: PORTABLE RIGHT KNEE - 1-2 VIEW COMPARISON:  11/11/2020 FINDINGS: Interval debridement of right knee with placement of right  femoral prosthetic and proximal tibial spacer device. No fracture seen. Placement of suprapatellar drainage catheter. IMPRESSION: Interval postsurgical changes of the right knee Electronically Signed   By: Donavan Foil M.D.   On: 11/13/2020 21:05        Scheduled Meds:  sodium chloride   Intravenous  Once   sodium chloride   Intravenous Once   amLODipine  10 mg Oral Daily   aspirin  81 mg Oral BID   carbamazepine  1,000 mg Oral QHS   carbamazepine  200 mg Oral q morning   Chlorhexidine Gluconate Cloth  6 each Topical Q0600   docusate sodium  100 mg Oral BID   feeding supplement (GLUCERNA SHAKE)  237 mL Oral TID BM   insulin aspart  0-5 Units Subcutaneous QHS   insulin aspart  0-9 Units Subcutaneous TID WC   insulin aspart  5 Units Subcutaneous TID WC   insulin glargine-yfgn  25 Units Subcutaneous Daily   lamoTRIgine  400 mg Oral Daily   lithium carbonate  300 mg Oral QPM   metoprolol tartrate  12.5 mg Oral BID   multivitamin with minerals  1 tablet Oral Daily   mupirocin ointment  1 application Nasal BID   pantoprazole  40 mg Oral Daily   rosuvastatin  40 mg Oral Daily   scopolamine  1 patch Transdermal Q72H   senna  1 tablet Oral BID   sodium chloride flush  10-40 mL Intracatheter Q12H   ziprasidone  120 mg Oral QHS   zolpidem  5 mg Oral QHS   Continuous Infusions:  sodium chloride 50 mL/hr at 11/14/20 1753    ceFAZolin (ANCEF) IV 2 g (11/15/20 0032)     LOS: 6 days    Vernell Leep, MD, [place], [facility]  To contact the attending provider between 7A-7P or the covering provider during after hours 7P-7A, please log into the web site [email or web address] and access using universal [place] password for that web site. If you do not have the password, please call the hospital operator.

## 2020-11-15 NOTE — Progress Notes (Signed)
Subjective: 2 Days Post-Op Procedure(s) (LRB): DEBRIDEMENT KNEE WITH PLACEMENT ARTICULATING ANTIBIOTIC SPACER (Right) Patient reports pain as mild.   Seen in rounds for [place] Overall doing okay Was not able to do much with therapy yesterday No acute events overnight Patient is currently receiving 1 unit of blood which is being follow by hospitalist  Objective: Vital signs in last 24 hours: Temp:  [99.1 F (37.3 C)-100.3 F (37.9 C)] 99.7 F (37.6 C) (10/23 0555) Pulse Rate:  [97-118] 97 (10/23 0555) Resp:  [18-24] 22 (10/23 0555) BP: (140-180)/(62-85) 153/64 (10/23 0555) SpO2:  [98 %-100 %] 100 % (10/23 0555)  Intake/Output from previous day: 10/22 0701 - 10/23 0700 In: 240 [P.O.:240] Out: 3040 [Urine:2950; Drains:90] Intake/Output this shift: No intake/output data recorded.  Recent Labs    11/13/20 1727 11/13/20 1959 11/14/20 0255 11/14/20 1905 11/15/20 0324  HGB 15.3* 8.8* 7.8* 7.2* 6.4*   Recent Labs    11/14/20 1905 11/15/20 0324  WBC 8.2 6.3  RBC 2.68* 2.35*  HCT 23.4* 20.9*  PLT 262 184   Recent Labs    11/14/20 0255 11/15/20 0324  NA 133* 133*  K 4.1 3.6  CL 103 103  CO2 24 25  BUN 8 <5*  CREATININE 0.48 0.42*  GLUCOSE 216* 215*  CALCIUM 8.1* 8.2*   No results for input(s): LABPT, INR in the last 72 hours.  Neurologically intact Neurovascular intact Sensation intact distally Intact pulses distally Dorsiflexion/Plantar flexion intact Incision: dressing C/D/I Compartment soft   Assessment/Plan: 2 Days Post-Op Procedure(s) (LRB): DEBRIDEMENT KNEE WITH PLACEMENT ARTICULATING ANTIBIOTIC SPACER (Right) Up with therapy, 50% WB with walker, KI needs to be in place at all times Left drainage today, producing significant amount on exam Most likely will not be discharged home today due to low hemoglobin level as well as difficulty getting up and ambulating with therapy.    Denman George [facility] [phone] 11/15/2020, 9:12  AM

## 2020-11-15 NOTE — Anesthesia Postprocedure Evaluation (Signed)
Anesthesia Post Note  Patient: Molly [name]  Procedure(s) Performed: DEBRIDEMENT KNEE WITH PLACEMENT ARTICULATING ANTIBIOTIC SPACER (Right: Knee)     Patient location during evaluation: PACU Anesthesia Type: General Level of consciousness: sedated and patient cooperative Pain management: pain level controlled Vital Signs Assessment: post-procedure vital signs reviewed and stable Respiratory status: spontaneous breathing Cardiovascular status: stable Anesthetic complications: no   No notable events documented.  Last Vitals:  Vitals:   11/15/20 1042 11/15/20 1112  BP: (!) 172/69 (!) 164/71  Pulse: (!) 101 (!) 103  Resp:  18  Temp: 37.4 C 37.6 C  SpO2: 97% 98%    Last Pain:  Vitals:   11/15/20 1112  TempSrc: Oral  PainSc:                  Lewie Loron

## 2020-11-15 NOTE — Consult Note (Signed)
[facility] Consultation Note  Referring Provider: [facility] Primary Care Physician:  Chyrel Masson  Reason for Consultation:  nausea/vomiting  HPI: Molly [name] is a 50 y.o. female whom we've been asked to see for nausea/vomiting.  She has history of previously well-controlled diabetes since adolescence and without any significant GI issues until about one month ago.  She reports having some right knee pain for 3 months that, eventually, showed evidence of joint infection after evaluation/assessment with many different orthopedic practices.  During this time, she was taking up to 5 ibuprofen per day to help with knee pain, but stopped taking NSAIDs about one month ago.  She has had, over the past one month, nausea and vomiting to foods and liquids.  Vomiting is typically brown liquid, and typically occurs at least 2 hours after eating.  No melena or hematemesis or hematochezia.  No prior endoscopy or colonoscopy.  Occasional use of narcotics post-operatively.  Metoclopramide here in hospital is only making modest improvements.  Has lost over 40 lbs over the past one month, due to inability to eat.  Has early satiety.   Past Medical History:  Diagnosis Date   ALLERGIC RHINITIS CAUSE UNSPECIFIED 03/06/2007   Bipolar 1 disorder (HCC)    CONTUSION, ANKLE 08/28/2007   DEPRESSION 08/03/2006   DIABETES MELLITUS, TYPE I 08/03/2006   DM retinopathy (HCC)    Encounter for long-term (current) use of other medications    Hyperlipidemia    MIGRAINE, CHRONIC 03/06/2007   Osteoporosis    SEBACEOUS CYST, INFECTED 07/13/2007   SMOKER 08/11/2009   THROMBOCYTOPENIA 05/07/2008   TINEA VERSICOLOR 07/13/2007    Past Surgical History:  Procedure Laterality Date   Abnormal U/S  12/10/1996   ELECTROCARDIOGRAM  06/13/2006   TUBAL LIGATION      Prior to Admission medications   Medication Sig Start Date End Date Taking? Authorizing Provider  amLODipine (NORVASC) 5 MG tablet Take 5  mg by mouth daily.   Yes [provider]  Calcium Carbonate (CALCIUM 600 PO) Take 1 tablet by mouth 2 (two) times daily.    Yes [provider]  carbamazepine (TEGRETOL) 200 MG tablet Take 2-4 tablets (400-800 mg total) by mouth 2 (two) times daily. 5 in the morning/ 1 in the evening Patient taking differently: No sig reported 01/02/18  Yes Olive Bass, FNP  cetirizine-pseudoephedrine (ZYRTEC-D) 5-120 MG tablet Take 1 tablet by mouth daily. 03/22/17  Yes Romero Belling, MD  cyclobenzaprine (FLEXERIL) 10 MG tablet Take 20 mg by mouth at bedtime.   Yes [provider]  FLOVENT DISKUS 50 MCG/BLIST diskus inhaler INHALE 1 PUFF BY MOUTH INTO THE LUNGS 2 TIMES DAILY Patient taking differently: Inhale 1 puff into the lungs 2 (two) times daily. 12/07/18  Yes Olive Bass, FNP  KLOR-CON M20 20 MEQ tablet Take 20 mEq by mouth daily. 11/02/20  Yes [provider]  lamoTRIgine (LAMICTAL) 200 MG tablet Take 200 mg by mouth 2 (two) times daily.    Yes [provider]  lithium carbonate (LITHOBID) 300 MG CR tablet Take 300 mg by mouth daily.   Yes [provider]  LYUMJEV 100 UNIT/ML SOLN Inject 150 Units into the skin See admin instructions. Insulin pump 11/04/20  Yes [provider]  Multiple Vitamin (MULTIVITAMIN) tablet Take 1 tablet by mouth daily.   Yes [provider]  ondansetron (ZOFRAN) 4 MG tablet TAKE 1 TABLET BY MOUTH EVERY 8 HOURS AS NEEDED FOR NAUSEA AND VOMITING Patient  taking differently: Take 4 mg by mouth every 8 (eight) hours as needed for nausea or vomiting. 10/21/17  Yes Romero Belling, MD  rosuvastatin (CRESTOR) 40 MG tablet TAKE 1 TABLET BY MOUTH DAILY GENERIC EQUIVALENT FOR CRESTOR Patient taking differently: Take 40 mg by mouth daily. 12/10/18  Yes Olive Bass, FNP  Sennosides 25 MG TABS Take 1 tablet by mouth daily.   Yes [provider]  ziprasidone (GEODON) 60 MG capsule Take  120 mg by mouth at bedtime. Take 2 by mouth at bedtime   Yes [provider]  zolpidem (AMBIEN) 10 MG tablet Take 20 mg by mouth at bedtime. 11/20/15  Yes [provider]  Alcohol Swabs (ALCOHOL PREP) 70 % PADS 8/day  250.01 10/27/11   Romero Belling, MD  alendronate (FOSAMAX) 70 MG tablet TAKE 1 TABLET BY MOUTH EVERY 7 DAYS. TAKE WITH FULL GLASS OF WATER ON AN EMPTY STOMACH Patient not taking: No sig reported 08/01/16   Romero Belling, MD  cetirizine-pseudoephedrine (ZYRTEC-D) 5-120 MG tablet Take 1 tablet by mouth 2 (two) times daily. Patient not taking: No sig reported 04/27/18   Olive Bass, FNP  glucose blood (FREESTYLE LITE) test strip 8/day dx 250.01, and lancets 06/01/15   Romero Belling, MD  Insulin Infusion Pump Supplies (MINIMED INFUSION SET-MMT 398) MISC Inject 1 Device into the skin 3 days. Change every 3 days Patient not taking: Reported on 11/09/2020 06/01/15   Romero Belling, MD  Insulin Infusion Pump Supplies (PARADIGM RESERVOIR ) MISC Inject 1 Device into the skin 3 days. Change every 3 days 06/01/15   Romero Belling, MD  insulin regular (NOVOLIN R) 100 units/mL injection INJECT A TOTAL OF 100 UNITS UNDER THE SKIN EVERY DAY VIA INSULIN PUMP; CALL TO SCHEDULE APPT ASAP Patient not taking: Reported on 11/09/2020 03/20/18   Romero Belling, MD  Lancets (FREESTYLE) lancets Use as instructed dx 250.01 08/10/12   Romero Belling, MD  rizatriptan (MAXALT) 10 MG tablet Take 1 tablet (10 mg total) by mouth as needed for migraine. May repeat in 2 hours if needed Patient not taking: No sig reported 11/09/16   Romero Belling, MD  triamcinolone cream (KENALOG) 0.1 % Apply 1 application topically daily. Patient not taking: No sig reported 07/26/17   [name], MD    Current Facility-Administered Medications  Medication Dose Route Frequency Provider Last Rate Last Admin   0.9 %  sodium chloride infusion (Manually program via Guardrails IV Fluids)   Intravenous Once Blount,  Andi Devon T, NP       0.9 %  sodium chloride infusion (Manually program via Guardrails IV Fluids)   Intravenous Once Blount, Andi Devon T, NP       0.9 %  sodium chloride infusion   Intravenous Continuous Elease Etienne, MD 50 mL/hr at 11/14/20 1753 Rate Change at 11/14/20 1753   alum & mag hydroxide-simeth (MAALOX/MYLANTA) 200-200-20 MG/5ML suspension 30 mL  30 mL Oral Q4H PRN Samson Frederic, MD   30 mL at 11/14/20 2039   amLODipine (NORVASC) tablet 10 mg  10 mg Oral Daily Swinteck, Arlys John, MD   10 mg at 11/14/20 1047   aspirin chewable tablet 81 mg  81 mg Oral BID Samson Frederic, MD   81 mg at 11/15/20 0002   carbamazepine (TEGRETOL) tablet 1,000 mg  1,000 mg Oral QHS Marcellus Scott D, MD   1,000 mg at 11/15/20 0026   carbamazepine (TEGRETOL) tablet 200 mg  200 mg Oral q morning Samson Frederic, MD   200  mg at 11/14/20 1100   ceFAZolin (ANCEF) IVPB 2g/100 mL premix  2 g Intravenous Q8H Swinteck, Arlys John, MD 200 mL/hr at 11/15/20 0032 2 g at 11/15/20 0032   Chlorhexidine Gluconate Cloth 2 % PADS 6 each  6 each Topical Q0600 Elease Etienne, MD   6 each at 11/14/20 0559   diphenhydrAMINE (BENADRYL) 12.5 MG/5ML elixir 12.5-25 mg  12.5-25 mg Oral Q4H PRN Swinteck, Arlys John, MD       docusate sodium (COLACE) capsule 100 mg  100 mg Oral BID Samson Frederic, MD   100 mg at 11/15/20 0002   feeding supplement (GLUCERNA SHAKE) (GLUCERNA SHAKE) liquid 237 mL  237 mL Oral TID BM Samson Frederic, MD   237 mL at 11/14/20 1352   hydrALAZINE (APRESOLINE) tablet 25 mg  25 mg Oral Q8H PRN Hongalgi, Maximino Greenland, MD       insulin aspart (novoLOG) injection 0-5 Units  0-5 Units Subcutaneous QHS Swinteck, Arlys John, MD       insulin aspart (novoLOG) injection 0-9 Units  0-9 Units Subcutaneous TID WC Samson Frederic, MD   2 Units at 11/14/20 1700   insulin aspart (novoLOG) injection 5 Units  5 Units Subcutaneous TID WC Samson Frederic, MD   5 Units at 11/14/20 1229   insulin glargine-yfgn (SEMGLEE) injection 25 Units  25 Units  Subcutaneous Daily Samson Frederic, MD   25 Units at 11/14/20 1048   lamoTRIgine (LAMICTAL) tablet 400 mg  400 mg Oral Daily Swinteck, Arlys John, MD   400 mg at 11/14/20 1047   lithium carbonate (LITHOBID) CR tablet 300 mg  300 mg Oral QPM Swinteck, Arlys John, MD   300 mg at 11/14/20 1810   LORazepam (ATIVAN) injection 1 mg  1 mg Intravenous Q8H PRN Samson Frederic, MD   1 mg at 11/14/20 2033   meclizine (ANTIVERT) tablet 25 mg  25 mg Oral TID PRN Samson Frederic, MD   25 mg at 11/12/20 2205   menthol-cetylpyridinium (CEPACOL) lozenge 3 mg  1 lozenge Oral PRN Swinteck, Arlys John, MD       Or   phenol (CHLORASEPTIC) mouth spray 1 spray  1 spray Mouth/Throat PRN Swinteck, Arlys John, MD       metoCLOPramide (REGLAN) tablet 5-10 mg  5-10 mg Oral Q8H PRN Swinteck, Arlys John, MD       Or   metoCLOPramide (REGLAN) injection 5-10 mg  5-10 mg Intravenous Q8H PRN Swinteck, Arlys John, MD   10 mg at 11/14/20 2032   metoprolol tartrate (LOPRESSOR) tablet 12.5 mg  12.5 mg Oral BID Marcellus Scott D, MD   12.5 mg at 11/15/20 0003   multivitamin with minerals tablet 1 tablet  1 tablet Oral Daily Swinteck, Arlys John, MD   1 tablet at 11/14/20 1047   mupirocin ointment (BACTROBAN) 2 % 1 application  1 application Nasal BID Elease Etienne, MD   1 application at 11/15/20 0030   ondansetron (ZOFRAN) tablet 4 mg  4 mg Oral Q6H PRN Swinteck, Arlys John, MD       Or   ondansetron [facility]) injection 4 mg  4 mg Intravenous Q6H PRN Swinteck, Arlys John, MD   4 mg at 11/14/20 1830   oxyCODONE-acetaminophen (PERCOCET/ROXICET) 5-325 MG per tablet 1-2 tablet  1-2 tablet Oral Q4H PRN Samson Frederic, MD   2 tablet at 11/14/20 0506   pantoprazole (PROTONIX) EC tablet 40 mg  40 mg Oral Daily Swinteck, Arlys John, MD   40 mg at 11/14/20 1047   polyethylene glycol (MIRALAX / GLYCOLAX) packet 17 g  17  g Oral Daily PRN Samson Frederic, MD       rosuvastatin (CRESTOR) tablet 40 mg  40 mg Oral Daily Swinteck, Arlys John, MD   40 mg at 11/13/20 1002   scopolamine (TRANSDERM-SCOP)  1 MG/3DAYS 1.5 mg  1 patch Transdermal Q72H Hongalgi, Anand D, MD   1.5 mg at 11/14/20 1700   senna (SENOKOT) tablet 8.6 mg  1 tablet Oral BID Samson Frederic, MD   8.6 mg at 11/15/20 0002   sodium chloride flush (NS) 0.9 % injection 10-40 mL  10-40 mL Intracatheter Q12H Swinteck, Arlys John, MD   10 mL at 11/14/20 1049   sodium chloride flush (NS) 0.9 % injection 10-40 mL  10-40 mL Intracatheter PRN Swinteck, Arlys John, MD       ziprasidone (GEODON) capsule 120 mg  120 mg Oral QHS Samson Frederic, MD   120 mg at 11/15/20 0027   zolpidem (AMBIEN) tablet 5 mg  5 mg Oral QHS Swinteck, Arlys John, MD   5 mg at 11/15/20 0003    Allergies as of 11/09/2020 - Review Complete 11/09/2020  Allergen Reaction Noted   Varenicline tartrate  08/11/2009    Family History  Problem Relation Age of Onset   Diabetes Father        (Oral Agents)   Diabetes Paternal Grandmother    Hypertension Paternal Grandmother    Diabetes Paternal Grandfather    Cancer Paternal Grandfather        colon   Hypertension Paternal Grandfather     Social History   Socioeconomic History   Marital status: Widowed    Spouse name: Not on file   Number of children: Not on file   Years of education: Not on file   Highest education level: Not on file  Occupational History   Not on file  Tobacco Use   Smoking status: Former    Types: Cigarettes    Quit date: 10/16/2010    Years since quitting: 10.0   Smokeless tobacco: Never  Vaping Use   Vaping Use: Never used  Substance and Sexual Activity   Alcohol use: No   Drug use: No   Sexual activity: Not Currently    Comment: tubal ligation, intercourse unknown , 5 seuxal partners,des neg  Other Topics Concern   Not on file  Social History Narrative   Does not work outside the home   Social Determinants of Health   Financial Resource Strain: Not on file  Food Insecurity: Not on file  Transportation Needs: Not on file  Physical Activity: Not on file  Stress: Not on file  Social  Connections: Not on file  Intimate Partner Violence: Not on file    Review of Systems: As per HPI, all others negative  Physical Exam: Vital signs in last 24 hours: Temp:  [99.1 F (37.3 C)-100.3 F (37.9 C)] 99.7 F (37.6 C) (10/23 1112) Pulse Rate:  [97-118] 103 (10/23 1112) Resp:  [18-24] 18 (10/23 1112) BP: (140-180)/(62-85) 164/71 (10/23 1112) SpO2:  [97 %-100 %] 98 % (10/23 1112) Last BM Date: 11/10/20 General:   Alert,  Well-developed, well-nourished, pleasant and cooperative in NAD Head:  Normocephalic and atraumatic. Eyes:  Sclera clear, no icterus.   Conjunctiva pale Ears:  Normal auditory acuity. Nose:  No deformity, discharge,  or lesions. Mouth:  No deformity or lesions.  Oropharynx pale and dry Neck:  Supple; no masses or thyromegaly. Abdomen:  Soft, nontender and nondistended. No masses, hepatosplenomegaly or hernias noted. Normal bowel sounds, without guarding, and without rebound.  Msk:  Symmetrical without gross deformities. Normal posture. Pulses:  Normal pulses noted. Extremities:  Without clubbing or edema. Neurologic:  Alert and  oriented x4;  grossly normal neurologically. Skin:  Pale, otherwise intact without significant lesions or rashes. Psych:  Alert and cooperative. Normal mood and affect.   Lab Results: Recent Labs    11/14/20 0255 11/14/20 1905 11/15/20 0324  WBC 11.4* 8.2 6.3  HGB 7.8* 7.2* 6.4*  HCT 25.6* 23.4* 20.9*  PLT 263 262 184   BMET Recent Labs    11/13/20 1959 11/14/20 0255 11/15/20 0324  NA 135 133* 133*  K 4.5 4.1 3.6  CL 101 103 103  CO2  --  24 25  GLUCOSE 132* 216* 215*  BUN 4* 8 <5*  CREATININE 0.40* 0.48 0.42*  CALCIUM  --  8.1* 8.2*   LFT Recent Labs    11/15/20 0324  PROT 5.6*  ALBUMIN 2.4*  AST 13*  ALT 8  ALKPHOS 69  BILITOT 0.3   PT/INR No results for input(s): LABPROT, INR in the last 72 hours.  Studies/Results: DG Knee Right Port  Result Date: 11/13/2020 CLINICAL DATA:  Postop EXAM:  PORTABLE RIGHT KNEE - 1-2 VIEW COMPARISON:  11/11/2020 FINDINGS: Interval debridement of right knee with placement of right femoral prosthetic and proximal tibial spacer device. No fracture seen. Placement of suprapatellar drainage catheter. IMPRESSION: Interval postsurgical changes of the right knee Electronically Signed   By: Jasmine Pang M.D.   On: 11/13/2020 21:05    Impression:   Nausea/vomiting.  Septic knee s/p surgery.  Weight loss 40+ lbs.  Anemia.  Not convinced all of this is from surgical losses.  Overall GI constellation of symptoms worrisome for peptic ulcer disease +/- partial gastric outlet obstruction.  Other possibilities include gastroparesis, systemic (n/v) reaction to infection, medication side effect, other.  Plan:   Endoscopy tomorrow for further evaluation.  I have discussed case with Dr. Linna Caprice (Orthopedics), who tells me that it is fine for patient to have endoscopy on left side and stay on left side for ~ 45 minutes, so long as her knee stabilizer is kept on. Risks (bleeding, infection, bowel perforation that could require surgery, sedation-related changes in cardiopulmonary systems), benefits (identification and possible treatment of source of symptoms, exclusion of certain causes of symptoms), and alternatives (watchful waiting, radiographic imaging studies, empiric medical treatment) of upper endoscopy (EGD) were explained to patient/family in detail and patient wishes to proceed.  Further management pending EGD findings.  [facility] will follow.   LOS: 6 days   Lasonja Lakins M  11/15/2020, 12:15 PM  Cell [phone] If no answer or after 5 PM call [phone]

## 2020-11-15 NOTE — Evaluation (Signed)
Occupational Therapy Evaluation Patient Details Name: Molly [name] MRN: [id number] DOB: Jan 15, 1971 Today's Date: 11/15/2020   History of Present Illness Molly [name] is a 50 y.o.  female with medical history significant of diabetes mellitus type 1, bipolar disorder, hyperlipidemia, insomnia. Patient presented secondary to a right knee septic arthritis with evidence of associated osteomyelitis on MRI.  S / P PPROCEDURE: 1. Open arthrotomy Right knee with excisional debridement of bone, cartilage, and synovium.   2. Placement of Prostalac articulating spacer right knee.  3. Application of negative pressure incisional dressing on 11/13/20   Clinical Impression   For the past 3 months, Pt has been sponge bathing, using DME for mobility and getting assist for ADL/IADL from family. 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  Today she was able to perform SPT at min A +2 safety with VC for safe hand placement and WB precautions. She was in the recliner and able to perform sit<>stand for peri care. Tolerated sitting in chair for approx 8 min before requesting return to bed. Educated in benefits of sitting up- respiratory and digestion etc. Pt wanted to return to bed for pain management. Pt is overall mod A for bed mobility, and did NOT experience any nausea or dizziness during session today. Pt will benefit from skilled OT at the acute level and also post-acute at the SNF or HHOT level. Pt stated that the MD wants her to go to "rehab" prior to her sx in 6 weeks. No matter which venue she will require post-acute OT.      Recommendations for follow up therapy are one component of a multi-disciplinary discharge planning process, led by the attending physician.  Recommendations may be updated based on patient status, additional functional criteria and insurance authorization.   Follow Up Recommendations  SNF (Pt agreeable)    Equipment Recommendations  3 in  1 bedside commode;Other (comment) (RW)    Recommendations for Other Services PT consult     Precautions / Restrictions Precautions Required Braces or Orthoses: Knee Immobilizer - Right Knee Immobilizer - Right: On at all times Restrictions Weight Bearing Restrictions: Yes RLE Weight Bearing: Partial weight bearing RLE Partial Weight Bearing Percentage or Pounds: 50 Other Position/Activity Restrictions: knee immobilizer at all times      Mobility Bed Mobility Overal bed mobility: Needs Assistance Bed Mobility: Supine to Sit;Sit to Supine     Supine to sit: Mod assist Sit to supine: Mod assist   General bed mobility comments: patient  moved right leg  toward bed edge, therapist supported while lowering  foot to floor, Patient used bed rail to turn on bed. Patient required support of right leg to place back onto bed.    Transfers Overall transfer level: Needs assistance Equipment used: Rolling walker (2 wheeled) Transfers: Sit to/from [employer] Sit to Stand: +2 safety/equipment;Min assist Stand pivot transfers: Min assist;+2 safety/equipment       General transfer comment: cues for hand placement and PWB. improved tolerance and no dizziness in standing today. shimmies left foot for pivot transfer    Balance Overall balance assessment: Needs assistance Sitting-balance support: Feet supported;Bilateral upper extremity supported Sitting balance-Leahy Scale: Fair Sitting balance - Comments: supports on UE's-guarding   Standing balance support: During functional activity;Bilateral upper extremity supported Standing balance-Leahy Scale: Poor Standing balance comment: reliant on RW  ADL either performed or assessed with clinical judgement   ADL Overall ADL's : Needs assistance/impaired Eating/Feeding: Modified independent;Bed level Eating/Feeding Details (indicate cue type and reason): ate 1/2 lunch - Salmon, carrots,  and green beans Grooming: Wash/dry face;Wash/dry hands;Oral care;Set up;Sitting Grooming Details (indicate cue type and reason): in recliner Upper Body Bathing: Moderate assistance;Sitting Upper Body Bathing Details (indicate cue type and reason): for back Lower Body Bathing: Moderate assistance Lower Body Bathing Details (indicate cue type and reason): kness down Upper Body Dressing : Set up   Lower Body Dressing: Maximal assistance   Toilet Transfer: Minimal assistance;+2 for safety/equipment;Stand-pivot;RW Toilet Transfer Details (indicate cue type and reason): maintaining WB precautions Toileting- Clothing Manipulation and Hygiene: Minimal assistance;Sit to/from stand       Functional mobility during ADLs: Minimal assistance;+2 for safety/equipment;Rolling walker       Vision Baseline Vision/History: 1 Wears glasses Ability to See in Adequate Light: 0 Adequate Patient Visual Report: No change from baseline Vision Assessment?: No apparent visual deficits     Perception     Praxis      Pertinent Vitals/Pain Pain Assessment: Faces Faces Pain Scale: Hurts little more Pain Location: right knee Pain Descriptors / Indicators: Aching;Grimacing Pain Intervention(s): Limited activity within patient's tolerance;Monitored during session;Repositioned     Hand Dominance Right   Extremity/Trunk Assessment Upper Extremity Assessment Upper Extremity Assessment: Overall WFL for tasks assessed   Lower Extremity Assessment Lower Extremity Assessment: Defer to PT evaluation   Cervical / Trunk Assessment Cervical / Trunk Assessment: Normal   Communication Communication Communication: No difficulties   Cognition Arousal/Alertness: Awake/alert Behavior During Therapy: WFL for tasks assessed/performed;Anxious (slightly anxious) Overall Cognitive Status: Within Functional Limits for tasks assessed                                     General Comments        Exercises     Shoulder Instructions      Home Living Family/patient expects to be discharged to:: Private residence Living Arrangements: Parent;Children Available Help at Discharge: Family;Available 24 hours/day Type of Home: House Home Access: Stairs to enter [employer] of Steps: 7- a door opens out. Entrance Stairs-Rails: None Home Layout: One level     Bathroom Shower/Tub: Chief Strategy Officer: Standard Bathroom Accessibility: No   Home Equipment: Crutches;Transport chair          Prior Functioning/Environment Level of Independence: Needs assistance  Gait / Transfers Assistance Needed: Patient reports  uses transprt chair for mobility except  has to use crutches  to get into BR, Described going up the steps on her bottom w/ someone supporting her  RLE, then gets into WC at top. ADL's / Homemaking Assistance Needed: sponge baths x several months.   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 would like to get back to that        OT Problem List: Decreased strength;Decreased range of motion;Decreased activity tolerance;Impaired balance (sitting and/or standing);Decreased knowledge of use of DME or AE;Pain      OT Treatment/Interventions: Self-care/ADL training;DME and/or AE instruction;Therapeutic activities;Patient/family education;Balance training    OT Goals(Current goals can be found in the care plan section) Acute Rehab OT Goals Patient Stated Goal: get as strong as possible prior to next surgery OT Goal Formulation: With patient Time For Goal Achievement: 11/30/20 Potential to Achieve Goals:  Good ADL Goals Pt Will Perform Grooming: with set-up;sitting Pt Will Perform Upper Body Dressing: with modified independence;sitting Pt Will Perform Lower Body Dressing: with supervision;with adaptive equipment;sit to/from stand Pt Will Transfer to Toilet: with supervision;ambulating Pt  Will Perform Toileting - Clothing Manipulation and hygiene: with modified independence;sit to/from stand Additional ADL Goal #1: Pt will perform bed mobility at mod I level prior to engaging in ADL  OT Frequency: Min 2X/week   Barriers to D/C:            Co-evaluation              AM-PAC OT "6 Clicks" Daily Activity     Outcome Measure Help from another person eating meals?: None Help from another person taking care of personal grooming?: A Little Help from another person toileting, which includes using toliet, bedpan, or urinal?: A Lot Help from another person bathing (including washing, rinsing, drying)?: A Lot Help from another person to put on and taking off regular upper body clothing?: A Little Help from another person to put on and taking off regular lower body clothing?: A Lot 6 Click Score: 16   End of Session Equipment Utilized During Treatment: Gait belt;Rolling walker;Right knee immobilizer Nurse Communication: Mobility status;Precautions;Weight bearing status  Activity Tolerance: Patient tolerated treatment well Patient left: in bed;with call bell/phone within reach;with bed alarm set  OT Visit Diagnosis: Unsteadiness on feet (R26.81);Muscle weakness (generalized) (M62.81);Pain Pain - Right/Left: Right Pain - part of body: Knee                Time: 1206-1238 OT Time Calculation (min): 32 min Charges:  OT General Charges $OT Visit: 1 Visit OT Evaluation $OT Eval Moderate Complexity: 1 Mod OT Treatments $Self Care/Home Management : 8-22 mins  Nyoka Cowden OTR/L Acute Rehabilitation Services Pager: [phone] Office: [phone]   Evern Bio Joory Gough 11/15/2020, 3:55 PM

## 2020-11-15 NOTE — Progress Notes (Signed)
   11/14/20 2011  Vitals  Temp 100.3 F (37.9 C)  Temp Source Oral  BP (!) 163/62  MAP (mmHg) 89  BP Location Left Arm  BP Method Automatic  Patient Position (if appropriate) Lying  Pulse Rate (!) 112  Pulse Rate Source Monitor  Resp 18  Level of Consciousness  Level of Consciousness Alert  MEWS COLOR  MEWS Score Color Yellow  Oxygen Therapy  SpO2 100 %  O2 Device Room Air  Pain Assessment  Pain Scale 0-10  Pain Score 5  Faces Pain Scale 6  Pain Type Surgical pain;Acute pain  Pain Location Knee  Pain Orientation Right  Pain Radiating Towards up & down leg  Pain Descriptors / Indicators Aching;Throbbing  Pain Frequency Intermittent  Pain Onset On-going  Patients Stated Pain Goal 0  Pain Intervention(s) Refused;Repositioned  Multiple Pain Sites No  PAINAD (Pain Assessment in Advanced Dementia)  Breathing 1  Negative Vocalization 0  Facial Expression 0  Body Language 1  Consolability 0  PAINAD Score 2  Complaints & Interventions  Complains of Heartburn  Interventions Medication (see MAR)  MEWS Score  MEWS Temp 0  MEWS Systolic 0  MEWS Pulse 2  MEWS RR 0  MEWS LOC 0  MEWS Score 2

## 2020-11-15 NOTE — H&P (View-Only) (Signed)
[facility] Consultation Note  Referring Provider: [facility] Primary Care Physician:  Chyrel Masson  Reason for Consultation:  nausea/vomiting  HPI: Molly [name] is a 50 y.o. female whom we've been asked to see for nausea/vomiting.  She has history of previously well-controlled diabetes since adolescence and without any significant GI issues until about one month ago.  She reports having some right knee pain for 3 months that, eventually, showed evidence of joint infection after evaluation/assessment with many different orthopedic practices.  During this time, she was taking up to 5 ibuprofen per day to help with knee pain, but stopped taking NSAIDs about one month ago.  She has had, over the past one month, nausea and vomiting to foods and liquids.  Vomiting is typically brown liquid, and typically occurs at least 2 hours after eating.  No melena or hematemesis or hematochezia.  No prior endoscopy or colonoscopy.  Occasional use of narcotics post-operatively.  Metoclopramide here in hospital is only making modest improvements.  Has lost over 40 lbs over the past one month, due to inability to eat.  Has early satiety.   Past Medical History:  Diagnosis Date   ALLERGIC RHINITIS CAUSE UNSPECIFIED 03/06/2007   Bipolar 1 disorder (HCC)    CONTUSION, ANKLE 08/28/2007   DEPRESSION 08/03/2006   DIABETES MELLITUS, TYPE I 08/03/2006   DM retinopathy (HCC)    Encounter for long-term (current) use of other medications    Hyperlipidemia    MIGRAINE, CHRONIC 03/06/2007   Osteoporosis    SEBACEOUS CYST, INFECTED 07/13/2007   SMOKER 08/11/2009   THROMBOCYTOPENIA 05/07/2008   TINEA VERSICOLOR 07/13/2007    Past Surgical History:  Procedure Laterality Date   Abnormal U/S  12/10/1996   ELECTROCARDIOGRAM  06/13/2006   TUBAL LIGATION      Prior to Admission medications   Medication Sig Start Date End Date Taking? Authorizing Provider  amLODipine (NORVASC) 5 MG tablet Take 5  mg by mouth daily.   Yes [provider]  Calcium Carbonate (CALCIUM 600 PO) Take 1 tablet by mouth 2 (two) times daily.    Yes [provider]  carbamazepine (TEGRETOL) 200 MG tablet Take 2-4 tablets (400-800 mg total) by mouth 2 (two) times daily. 5 in the morning/ 1 in the evening Patient taking differently: No sig reported 01/02/18  Yes Olive Bass, FNP  cetirizine-pseudoephedrine (ZYRTEC-D) 5-120 MG tablet Take 1 tablet by mouth daily. 03/22/17  Yes Romero Belling, MD  cyclobenzaprine (FLEXERIL) 10 MG tablet Take 20 mg by mouth at bedtime.   Yes [provider]  FLOVENT DISKUS 50 MCG/BLIST diskus inhaler INHALE 1 PUFF BY MOUTH INTO THE LUNGS 2 TIMES DAILY Patient taking differently: Inhale 1 puff into the lungs 2 (two) times daily. 12/07/18  Yes Olive Bass, FNP  KLOR-CON M20 20 MEQ tablet Take 20 mEq by mouth daily. 11/02/20  Yes [provider]  lamoTRIgine (LAMICTAL) 200 MG tablet Take 200 mg by mouth 2 (two) times daily.    Yes [provider]  lithium carbonate (LITHOBID) 300 MG CR tablet Take 300 mg by mouth daily.   Yes [provider]  LYUMJEV 100 UNIT/ML SOLN Inject 150 Units into the skin See admin instructions. Insulin pump 11/04/20  Yes [provider]  Multiple Vitamin (MULTIVITAMIN) tablet Take 1 tablet by mouth daily.   Yes [provider]  ondansetron (ZOFRAN) 4 MG tablet TAKE 1 TABLET BY MOUTH EVERY 8 HOURS AS NEEDED FOR NAUSEA AND VOMITING Patient  taking differently: Take 4 mg by mouth every 8 (eight) hours as needed for nausea or vomiting. 10/21/17  Yes Ellison, Sean, MD  rosuvastatin (CRESTOR) 40 MG tablet TAKE 1 TABLET BY MOUTH DAILY GENERIC EQUIVALENT FOR CRESTOR Patient taking differently: Take 40 mg by mouth daily. 12/10/18  Yes Murray, Laura Woodruff, FNP  Sennosides 25 MG TABS Take 1 tablet by mouth daily.   Yes [provider]  ziprasidone (GEODON) 60 MG capsule Take  120 mg by mouth at bedtime. Take 2 by mouth at bedtime   Yes [provider]  zolpidem (AMBIEN) 10 MG tablet Take 20 mg by mouth at bedtime. 11/20/15  Yes [provider]  Alcohol Swabs (ALCOHOL PREP) 70 % PADS 8/day  250.01 10/27/11   Ellison, Sean, MD  alendronate (FOSAMAX) 70 MG tablet TAKE 1 TABLET BY MOUTH EVERY 7 DAYS. TAKE WITH FULL GLASS OF WATER ON AN EMPTY STOMACH Patient not taking: No sig reported 08/01/16   Ellison, Sean, MD  cetirizine-pseudoephedrine (ZYRTEC-D) 5-120 MG tablet Take 1 tablet by mouth 2 (two) times daily. Patient not taking: No sig reported 04/27/18   Murray, Laura Woodruff, FNP  glucose blood (FREESTYLE LITE) test strip 8/day dx 250.01, and lancets 06/01/15   Ellison, Sean, MD  Insulin Infusion Pump Supplies (MINIMED INFUSION SET-MMT 398) MISC Inject 1 Device into the skin 3 days. Change every 3 days Patient not taking: Reported on 11/09/2020 06/01/15   Ellison, Sean, MD  Insulin Infusion Pump Supplies (PARADIGM RESERVOIR 3ML) MISC Inject 1 Device into the skin 3 days. Change every 3 days 06/01/15   Ellison, Sean, MD  insulin regular (NOVOLIN R) 100 units/mL injection INJECT A TOTAL OF 100 UNITS UNDER THE SKIN EVERY DAY VIA INSULIN PUMP; CALL TO SCHEDULE APPT ASAP Patient not taking: Reported on 11/09/2020 03/20/18   Ellison, Sean, MD  Lancets (FREESTYLE) lancets Use as instructed dx 250.01 08/10/12   Ellison, Sean, MD  rizatriptan (MAXALT) 10 MG tablet Take 1 tablet (10 mg total) by mouth as needed for migraine. May repeat in 2 hours if needed Patient not taking: No sig reported 11/09/16   Ellison, Sean, MD  triamcinolone cream (KENALOG) 0.1 % Apply 1 application topically daily. Patient not taking: No sig reported 07/26/17   Lavoie, Marie-Lyne, MD    Current Facility-Administered Medications  Medication Dose Route Frequency Provider Last Rate Last Admin   0.9 %  sodium chloride infusion (Manually program via Guardrails IV Fluids)   Intravenous Once Blount,  Xenia T, NP       0.9 %  sodium chloride infusion (Manually program via Guardrails IV Fluids)   Intravenous Once Blount, Xenia T, NP       0.9 %  sodium chloride infusion   Intravenous Continuous Hongalgi, Anand D, MD 50 mL/hr at 11/14/20 1753 Rate Change at 11/14/20 1753   alum & mag hydroxide-simeth (MAALOX/MYLANTA) 200-200-20 MG/5ML suspension 30 mL  30 mL Oral Q4H PRN Swinteck, Brian, MD   30 mL at 11/14/20 2039   amLODipine (NORVASC) tablet 10 mg  10 mg Oral Daily Swinteck, Brian, MD   10 mg at 11/14/20 1047   aspirin chewable tablet 81 mg  81 mg Oral BID Swinteck, Brian, MD   81 mg at 11/15/20 0002   carbamazepine (TEGRETOL) tablet 1,000 mg  1,000 mg Oral QHS Hongalgi, Anand D, MD   1,000 mg at 11/15/20 0026   carbamazepine (TEGRETOL) tablet 200 mg  200 mg Oral q morning Swinteck, Brian, MD   200   mg at 11/14/20 1100   ceFAZolin (ANCEF) IVPB 2g/100 mL premix  2 g Intravenous Q8H Swinteck, Arlys John, MD 200 mL/hr at 11/15/20 0032 2 g at 11/15/20 0032   Chlorhexidine Gluconate Cloth 2 % PADS 6 each  6 each Topical Q0600 Elease Etienne, MD   6 each at 11/14/20 0559   diphenhydrAMINE (BENADRYL) 12.5 MG/5ML elixir 12.5-25 mg  12.5-25 mg Oral Q4H PRN Swinteck, Arlys John, MD       docusate sodium (COLACE) capsule 100 mg  100 mg Oral BID Samson Frederic, MD   100 mg at 11/15/20 0002   feeding supplement (GLUCERNA SHAKE) (GLUCERNA SHAKE) liquid 237 mL  237 mL Oral TID BM Samson Frederic, MD   237 mL at 11/14/20 1352   hydrALAZINE (APRESOLINE) tablet 25 mg  25 mg Oral Q8H PRN Hongalgi, Maximino Greenland, MD       insulin aspart (novoLOG) injection 0-5 Units  0-5 Units Subcutaneous QHS Swinteck, Arlys John, MD       insulin aspart (novoLOG) injection 0-9 Units  0-9 Units Subcutaneous TID WC Samson Frederic, MD   2 Units at 11/14/20 1700   insulin aspart (novoLOG) injection 5 Units  5 Units Subcutaneous TID WC Samson Frederic, MD   5 Units at 11/14/20 1229   insulin glargine-yfgn (SEMGLEE) injection 25 Units  25 Units  Subcutaneous Daily Samson Frederic, MD   25 Units at 11/14/20 1048   lamoTRIgine (LAMICTAL) tablet 400 mg  400 mg Oral Daily Swinteck, Arlys John, MD   400 mg at 11/14/20 1047   lithium carbonate (LITHOBID) CR tablet 300 mg  300 mg Oral QPM Swinteck, Arlys John, MD   300 mg at 11/14/20 1810   LORazepam (ATIVAN) injection 1 mg  1 mg Intravenous Q8H PRN Samson Frederic, MD   1 mg at 11/14/20 2033   meclizine (ANTIVERT) tablet 25 mg  25 mg Oral TID PRN Samson Frederic, MD   25 mg at 11/12/20 2205   menthol-cetylpyridinium (CEPACOL) lozenge 3 mg  1 lozenge Oral PRN Swinteck, Arlys John, MD       Or   phenol (CHLORASEPTIC) mouth spray 1 spray  1 spray Mouth/Throat PRN Swinteck, Arlys John, MD       metoCLOPramide (REGLAN) tablet 5-10 mg  5-10 mg Oral Q8H PRN Swinteck, Arlys John, MD       Or   metoCLOPramide (REGLAN) injection 5-10 mg  5-10 mg Intravenous Q8H PRN Swinteck, Arlys John, MD   10 mg at 11/14/20 2032   metoprolol tartrate (LOPRESSOR) tablet 12.5 mg  12.5 mg Oral BID Marcellus Scott D, MD   12.5 mg at 11/15/20 0003   multivitamin with minerals tablet 1 tablet  1 tablet Oral Daily Swinteck, Arlys John, MD   1 tablet at 11/14/20 1047   mupirocin ointment (BACTROBAN) 2 % 1 application  1 application Nasal BID Elease Etienne, MD   1 application at 11/15/20 0030   ondansetron (ZOFRAN) tablet 4 mg  4 mg Oral Q6H PRN Swinteck, Arlys John, MD       Or   ondansetron [facility]) injection 4 mg  4 mg Intravenous Q6H PRN Swinteck, Arlys John, MD   4 mg at 11/14/20 1830   oxyCODONE-acetaminophen (PERCOCET/ROXICET) 5-325 MG per tablet 1-2 tablet  1-2 tablet Oral Q4H PRN Samson Frederic, MD   2 tablet at 11/14/20 0506   pantoprazole (PROTONIX) EC tablet 40 mg  40 mg Oral Daily Swinteck, Arlys John, MD   40 mg at 11/14/20 1047   polyethylene glycol (MIRALAX / GLYCOLAX) packet 17 g  17  g Oral Daily PRN Samson Frederic, MD       rosuvastatin (CRESTOR) tablet 40 mg  40 mg Oral Daily Swinteck, Arlys John, MD   40 mg at 11/13/20 1002   scopolamine (TRANSDERM-SCOP)  1 MG/3DAYS 1.5 mg  1 patch Transdermal Q72H Hongalgi, Anand D, MD   1.5 mg at 11/14/20 1700   senna (SENOKOT) tablet 8.6 mg  1 tablet Oral BID Samson Frederic, MD   8.6 mg at 11/15/20 0002   sodium chloride flush (NS) 0.9 % injection 10-40 mL  10-40 mL Intracatheter Q12H Swinteck, Arlys John, MD   10 mL at 11/14/20 1049   sodium chloride flush (NS) 0.9 % injection 10-40 mL  10-40 mL Intracatheter PRN Swinteck, Arlys John, MD       ziprasidone (GEODON) capsule 120 mg  120 mg Oral QHS Samson Frederic, MD   120 mg at 11/15/20 0027   zolpidem (AMBIEN) tablet 5 mg  5 mg Oral QHS Swinteck, Arlys John, MD   5 mg at 11/15/20 0003    Allergies as of 11/09/2020 - Review Complete 11/09/2020  Allergen Reaction Noted   Varenicline tartrate  08/11/2009    Family History  Problem Relation Age of Onset   Diabetes Father        (Oral Agents)   Diabetes Paternal Grandmother    Hypertension Paternal Grandmother    Diabetes Paternal Grandfather    Cancer Paternal Grandfather        colon   Hypertension Paternal Grandfather     Social History   Socioeconomic History   Marital status: Widowed    Spouse name: Not on file   Number of children: Not on file   Years of education: Not on file   Highest education level: Not on file  Occupational History   Not on file  Tobacco Use   Smoking status: Former    Types: Cigarettes    Quit date: 10/16/2010    Years since quitting: 10.0   Smokeless tobacco: Never  Vaping Use   Vaping Use: Never used  Substance and Sexual Activity   Alcohol use: No   Drug use: No   Sexual activity: Not Currently    Comment: tubal ligation, intercourse unknown , 5 seuxal partners,des neg  Other Topics Concern   Not on file  Social History Narrative   Does not work outside the home   Social Determinants of Health   Financial Resource Strain: Not on file  Food Insecurity: Not on file  Transportation Needs: Not on file  Physical Activity: Not on file  Stress: Not on file  Social  Connections: Not on file  Intimate Partner Violence: Not on file    Review of Systems: As per HPI, all others negative  Physical Exam: Vital signs in last 24 hours: Temp:  [99.1 F (37.3 C)-100.3 F (37.9 C)] 99.7 F (37.6 C) (10/23 1112) Pulse Rate:  [97-118] 103 (10/23 1112) Resp:  [18-24] 18 (10/23 1112) BP: (140-180)/(62-85) 164/71 (10/23 1112) SpO2:  [97 %-100 %] 98 % (10/23 1112) Last BM Date: 11/10/20 General:   Alert,  Well-developed, well-nourished, pleasant and cooperative in NAD Head:  Normocephalic and atraumatic. Eyes:  Sclera clear, no icterus.   Conjunctiva pale Ears:  Normal auditory acuity. Nose:  No deformity, discharge,  or lesions. Mouth:  No deformity or lesions.  Oropharynx pale and dry Neck:  Supple; no masses or thyromegaly. Abdomen:  Soft, nontender and nondistended. No masses, hepatosplenomegaly or hernias noted. Normal bowel sounds, without guarding, and without rebound.  Msk:  Symmetrical without gross deformities. Normal posture. Pulses:  Normal pulses noted. Extremities:  Without clubbing or edema. Neurologic:  Alert and  oriented x4;  grossly normal neurologically. Skin:  Pale, otherwise intact without significant lesions or rashes. Psych:  Alert and cooperative. Normal mood and affect.   Lab Results: Recent Labs    11/14/20 0255 11/14/20 1905 11/15/20 0324  WBC 11.4* 8.2 6.3  HGB 7.8* 7.2* 6.4*  HCT 25.6* 23.4* 20.9*  PLT 263 262 184   BMET Recent Labs    11/13/20 1959 11/14/20 0255 11/15/20 0324  NA 135 133* 133*  K 4.5 4.1 3.6  CL 101 103 103  CO2  --  24 25  GLUCOSE 132* 216* 215*  BUN 4* 8 <5*  CREATININE 0.40* 0.48 0.42*  CALCIUM  --  8.1* 8.2*   LFT Recent Labs    11/15/20 0324  PROT 5.6*  ALBUMIN 2.4*  AST 13*  ALT 8  ALKPHOS 69  BILITOT 0.3   PT/INR No results for input(s): LABPROT, INR in the last 72 hours.  Studies/Results: DG Knee Right Port  Result Date: 11/13/2020 CLINICAL DATA:  Postop EXAM:  PORTABLE RIGHT KNEE - 1-2 VIEW COMPARISON:  11/11/2020 FINDINGS: Interval debridement of right knee with placement of right femoral prosthetic and proximal tibial spacer device. No fracture seen. Placement of suprapatellar drainage catheter. IMPRESSION: Interval postsurgical changes of the right knee Electronically Signed   By: Jasmine Pang M.D.   On: 11/13/2020 21:05    Impression:   Nausea/vomiting.  Septic knee s/p surgery.  Weight loss 40+ lbs.  Anemia.  Not convinced all of this is from surgical losses.  Overall GI constellation of symptoms worrisome for peptic ulcer disease +/- partial gastric outlet obstruction.  Other possibilities include gastroparesis, systemic (n/v) reaction to infection, medication side effect, other.  Plan:   Endoscopy tomorrow for further evaluation.  I have discussed case with Dr. Linna Caprice (Orthopedics), who tells me that it is fine for patient to have endoscopy on left side and stay on left side for ~ 45 minutes, so long as her knee stabilizer is kept on. Risks (bleeding, infection, bowel perforation that could require surgery, sedation-related changes in cardiopulmonary systems), benefits (identification and possible treatment of source of symptoms, exclusion of certain causes of symptoms), and alternatives (watchful waiting, radiographic imaging studies, empiric medical treatment) of upper endoscopy (EGD) were explained to patient/family in detail and patient wishes to proceed.  Further management pending EGD findings.  [facility] will follow.   LOS: 6 days   Barbara Keng M  11/15/2020, 12:15 PM  Cell [phone] If no answer or after 5 PM call [phone]

## 2020-11-16 ENCOUNTER — Inpatient Hospital Stay (HOSPITAL_COMMUNITY): Payer: Managed Care, Other (non HMO) | Admitting: Anesthesiology

## 2020-11-16 ENCOUNTER — Encounter (HOSPITAL_COMMUNITY)
Admission: AD | Disposition: A | Discharge status: Skilled Nursing Facility | Payer: Self-pay | Source: Home / Self Care | Attending: Internal Medicine

## 2020-11-16 ENCOUNTER — Encounter (HOSPITAL_COMMUNITY): Payer: Self-pay | Admitting: Family Medicine

## 2020-11-16 HISTORY — PX: BIOPSY: SHX5522

## 2020-11-16 HISTORY — PX: ESOPHAGOGASTRODUODENOSCOPY (EGD) WITH PROPOFOL: SHX5813

## 2020-11-16 LAB — GLUCOSE, CAPILLARY
Glucose-Capillary: 174 mg/dL — ABNORMAL HIGH (ref 70–99)
Glucose-Capillary: 196 mg/dL — ABNORMAL HIGH (ref 70–99)
Glucose-Capillary: 136 mg/dL — ABNORMAL HIGH (ref 70–99)
Glucose-Capillary: 120 mg/dL — ABNORMAL HIGH (ref 70–99)

## 2020-11-16 LAB — BASIC METABOLIC PANEL
Anion gap: 4 — ABNORMAL LOW (ref 5–15)
BUN: 5 mg/dL — ABNORMAL LOW (ref 6–20)
CO2: 28 mmol/L (ref 22–32)
Calcium: 8.4 mg/dL — ABNORMAL LOW (ref 8.9–10.3)
Chloride: 104 mmol/L (ref 98–111)
Creatinine, Ser: 0.3 mg/dL — ABNORMAL LOW (ref 0.44–1.00)
GFR, Estimated: >60 mL/min (ref >60)
Glucose, Bld: 163 mg/dL — ABNORMAL HIGH (ref 70–99)
Potassium: 3.2 mmol/L — ABNORMAL LOW (ref 3.5–5.1)
Sodium: 136 mmol/L (ref 135–145)

## 2020-11-16 LAB — CBC
HCT: 29.4 % — ABNORMAL LOW (ref 36.0–46.0)
Hemoglobin: 9.6 g/dL — ABNORMAL LOW (ref 12.0–15.0)
MCH: 28.6 pg (ref 26.0–34.0)
MCHC: 32.7 g/dL (ref 30.0–36.0)
MCV: 87.5 fL (ref 80.0–100.0)
Platelets: 193 10*3/uL (ref 150–400)
RBC: 3.36 MIL/uL — ABNORMAL LOW (ref 3.87–5.11)
RDW: 15.4 % (ref 11.5–15.5)
WBC: 5.5 10*3/uL (ref 4.0–10.5)
nRBC: 0 % (ref 0.0–0.2)

## 2020-11-16 LAB — HEMOGLOBIN AND HEMATOCRIT, BLOOD
HCT: 30.6 % — ABNORMAL LOW (ref 36.0–46.0)
Hemoglobin: 9.9 g/dL — ABNORMAL LOW (ref 12.0–15.0)

## 2020-11-16 LAB — MAGNESIUM: Magnesium: 2 mg/dL (ref 1.7–2.4)

## 2020-11-16 SURGERY — ESOPHAGOGASTRODUODENOSCOPY (EGD) WITH PROPOFOL
Anesthesia: Monitor Anesthesia Care

## 2020-11-16 MED ORDER — PROPOFOL 500 MG/50ML IV EMUL
INTRAVENOUS | Status: DC | PRN
  Administered 2020-11-16: 150 ug/kg/min via INTRAVENOUS

## 2020-11-16 MED ORDER — METOPROLOL TARTRATE 25 MG PO TABS
25.0000 mg | ORAL_TABLET | Freq: Two times a day (BID) | ORAL | Status: DC
  Administered 2020-11-16 – 2020-11-19 (×6): 25 mg via ORAL
  Filled 2020-11-16 (×6): qty 1

## 2020-11-16 MED ORDER — OXYCODONE-ACETAMINOPHEN 5-325 MG PO TABS: 1.0000 | ORAL_TABLET | ORAL | 0 refills | Status: DC | PRN

## 2020-11-16 MED ORDER — INFLUENZA VAC SPLIT QUAD 0.5 ML IM SUSY
0.5000 mL | PREFILLED_SYRINGE | INTRAMUSCULAR | Status: AC
  Administered 2020-11-17: 0.5 mL via INTRAMUSCULAR
  Filled 2020-11-16: qty 0.5

## 2020-11-16 MED ORDER — POTASSIUM CHLORIDE 10 MEQ/100ML IV SOLN
10.0000 meq | INTRAVENOUS | Status: AC
  Administered 2020-11-16 (×4): 10 meq via INTRAVENOUS
  Filled 2020-11-16 (×4): qty 100

## 2020-11-16 MED ORDER — LACTATED RINGERS IV SOLN: INTRAVENOUS | Status: DC | PRN

## 2020-11-16 MED ORDER — PROPOFOL 10 MG/ML IV BOLUS
INTRAVENOUS | Status: DC | PRN
  Administered 2020-11-16: 40 mg via INTRAVENOUS

## 2020-11-16 MED ORDER — LIDOCAINE 2% (20 MG/ML) 5 ML SYRINGE
INTRAMUSCULAR | Status: DC | PRN
  Administered 2020-11-16: 60 mg via INTRAVENOUS

## 2020-11-16 MED ORDER — ASPIRIN 81 MG PO CHEW: 81.0000 mg | CHEWABLE_TABLET | Freq: Two times a day (BID) | ORAL | 1 refills | Status: DC

## 2020-11-16 SURGICAL SUPPLY — 15 items

## 2020-11-16 NOTE — Progress Notes (Signed)
Subjective: 3 Days Post-Op Procedure(s) (LRB): DEBRIDEMENT KNEE WITH PLACEMENT ARTICULATING ANTIBIOTIC SPACER (Right) Patient reports pain as mild.   Overall doing okay No acute events overnight HgB 9.6 this AM  Objective: Vital signs in last 24 hours: Temp:  [98.3 F (36.8 C)-99.7 F (37.6 C)] 98.3 F (36.8 C) (10/24 0329) Pulse Rate:  [89-103] 89 (10/24 0329) Resp:  [18-22] 20 (10/24 0329) BP: (140-183)/(64-87) 140/64 (10/24 0329) SpO2:  [97 %-100 %] 100 % (10/24 0329)  Intake/Output from previous day: 10/23 0701 - 10/24 0700 In: 6164.3 [I.V.:3720.6; Blood:1081.3; IV Piggyback:1362.4] Out: 2890 [Urine:2800; Drains:90] Intake/Output this shift: Total I/O In: -  Out: 500 [Urine:500]  Recent Labs    11/14/20 0255 11/14/20 1905 11/15/20 0324 11/15/20 1844 11/16/20 0304  HGB 7.8* 7.2* 6.4* 10.3* 9.6*    Recent Labs    11/15/20 0324 11/15/20 1844 11/16/20 0304  WBC 6.3  --  5.5  RBC 2.35*  --  3.36*  HCT 20.9* 31.5* 29.4*  PLT 184  --  193    Recent Labs    11/15/20 0324 11/16/20 0304  NA 133* 136  K 3.6 3.2*  CL 103 104  CO2 25 28  BUN <5* 5*  CREATININE 0.42* 0.30*  GLUCOSE 215* 163*  CALCIUM 8.2* 8.4*    No results for input(s): LABPT, INR in the last 72 hours.  Neurologically intact Neurovascular intact Sensation intact distally Intact pulses distally Dorsiflexion/Plantar flexion intact Incision: dressing C/D/I Compartment soft   Assessment/Plan: 3 Days Post-Op Procedure(s) (LRB): DEBRIDEMENT KNEE WITH PLACEMENT ARTICULATING ANTIBIOTIC SPACER (Right) Up with therapy, 50% WB with walker, KI needs to be in place at all times Left drain in place, hemovac can be removed at discharge House vac will need to be converted to Prevena portable unit at discharge Clear for discharge from an orthopedic standpoint.    Darrick Grinder, PA-C [facility] 11/16/2020, 10:04 AM

## 2020-11-16 NOTE — TOC Initial Note (Signed)
Transition of Care [facility]) - Initial/Assessment Note    Patient Details  Name: Molly [name] MRN: [id number] Date of Birth: 10/26/1970  [facility]) CM/SW Contact:    Ida Rogue, LCSW Phone Number: 11/16/2020, 3:17 PM  Clinical Narrative:   Patient seen in follow up to PT/OT recommendation of SNF.  [name] is open to this suggestion as she knows she has a significant convalescence ahead of her, lives alone here in [place].   She wondered what [facility] PT would look like compared to SNF.  Explained. Bed search process and insurance authorization process explained.  Bed search initiated. TOC will continue to follow during the course of hospitalization.              Expected Discharge Plan: Skilled Nursing Facility Barriers to Discharge: SNF Pending bed offer   Patient Goals and CMS Choice     Choice offered to / list presented to : Patient  Expected Discharge Plan and Services Expected Discharge Plan: Skilled Nursing Facility   Discharge Planning Services: CM Consult Post Acute Care Choice: Skilled Nursing Facility Living arrangements for the past 2 months: Single Family Home                                      Prior Living Arrangements/Services Living arrangements for the past 2 months: Single Family Home Lives with:: Self Patient language and need for interpreter reviewed:: Yes        Need for Family Participation in Patient Care: Yes (Comment) Care giver support system in place?: Yes (comment) Current home services: DME Criminal Activity/Legal Involvement Pertinent to Current Situation/Hospitalization: No - Comment as needed  Activities of Daily Living Home Assistive Devices/Equipment: Crutches, Wheelchair ADL Screening (condition at time of admission) Patient's cognitive ability adequate to safely complete daily activities?: Yes Is the patient deaf or have difficulty hearing?: No Does the patient have difficulty seeing, even when wearing  glasses/contacts?: No Does the patient have difficulty concentrating, remembering, or making decisions?: No Patient able to express need for assistance with ADLs?: Yes Does the patient have difficulty dressing or bathing?: Yes Independently performs ADLs?: No Communication: Independent Dressing (OT): Needs assistance Is this a change from baseline?: Pre-admission baseline Grooming: Independent Feeding: Independent Bathing: Needs assistance Is this a change from baseline?: Pre-admission baseline Toileting: Needs assistance Is this a change from baseline?: Pre-admission baseline In/Out Bed: Needs assistance Is this a change from baseline?: Pre-admission baseline Walks in Home: Dependent (wheelchair) Is this a change from baseline?: Pre-admission baseline Does the patient have difficulty walking or climbing stairs?: Yes Weakness of Legs: Right Weakness of Arms/Hands: None  Permission Sought/Granted                  Emotional Assessment Appearance:: Appears stated age Attitude/Demeanor/Rapport: Engaged Affect (typically observed): Appropriate Orientation: : Oriented to Self, Oriented to Place, Oriented to  Time, Oriented to Situation Alcohol / Substance Use: Not Applicable Psych Involvement: No (comment)  Admission diagnosis:  Septic joint of right knee joint (HCC) [M00.9] Patient Active Problem List   Diagnosis Date Noted   Protein-calorie malnutrition, severe 11/11/2020   Unspecified severe protein-calorie malnutrition (HCC) 11/10/2020   Septic joint of right knee joint (HCC) 11/09/2020   Sinusitis 04/20/2017   Otalgia of both ears 03/30/2016   Strain of chest wall 11/30/2015   URI (upper respiratory infection) 11/12/2015   Amenorrhea, secondary 03/02/2015  Recurrent acute sinusitis 05/23/2014   Anemia 04/08/2014   Diabetic ketoacidosis without coma associated with type 1 diabetes mellitus (HCC)    HLD (hyperlipidemia) 04/07/2014   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   Fever 04/07/2014   DKA (diabetic ketoacidoses) 04/06/2014   Nausea & vomiting 04/06/2014   Bipolar 1 disorder (HCC)    Pain in joint, upper arm 10/22/2013   Routine general medical examination at a health care facility 10/04/2013   Visit for occupational health examination 11/10/2012   Nonspecific abnormal electrocardiogram (ECG) (EKG) 10/02/2012   Osteoporosis 10/02/2012   Quit smoking 04/24/2012   Arthralgia of hand, left 12/01/2011   Encounter for long-term (current) use of other medications 09/14/2010   HEMATURIA UNSPECIFIED 08/11/2009   PURE HYPERCHOLESTEROLEMIA 08/05/2008   THROMBOCYTOPENIA 05/07/2008   CONTUSION, ANKLE 08/28/2007   TINEA VERSICOLOR 07/13/2007   SEBACEOUS CYST, INFECTED 07/13/2007   COUGH 04/18/2007   Migraine 03/06/2007   Seasonal and perennial allergic rhinitis 03/06/2007   Type 1 diabetes mellitus (HCC) 08/03/2006   Depression 08/03/2006   PCP:  Virgilio Belling, PA-C Pharmacy:   [id number] - [place], [place] - [address] [place] [address] Phone: [phone] Fax: [phone]  [facility]) [facility] - [place], [place] - [address] [place] [address] Phone: [phone] Fax: [phone]  [facility] [address], [place] - [address] [place] [place] [address] Phone: [phone] Fax: [phone]     Social Determinants of Health (SDOH) Interventions    Readmission Risk Interventions No flowsheet data found.

## 2020-11-16 NOTE — Transfer of Care (Signed)
Immediate Anesthesia Transfer of Care Note  Patient: Molly [name]  Procedure(s) Performed: ESOPHAGOGASTRODUODENOSCOPY (EGD) WITH PROPOFOL BIOPSY  Patient Location: Endoscopy Unit  Anesthesia Type:MAC  Level of Consciousness: awake, alert , oriented and patient cooperative  Airway & Oxygen Therapy: Patient Spontanous Breathing and Patient connected to face mask oxygen  Post-op Assessment: Report given to RN, Post -op Vital signs reviewed and stable and Patient moving all extremities  Post vital signs: Reviewed and stable  Last Vitals:  Vitals Value Taken Time  BP 151/72 11/16/20 1330  Temp    Pulse 87 11/16/20 1331  Resp 29 11/16/20 1331  SpO2 100 % 11/16/20 1331  Vitals shown include unvalidated device data.  Last Pain:  Vitals:   11/16/20 1213  TempSrc: Oral  PainSc: 3       Patients Stated Pain Goal: 0 (16/10/96 0454)  Complications: No notable events documented.

## 2020-11-16 NOTE — Progress Notes (Signed)
PROGRESS NOTE    Molly [name]  MHD:[id number] DOB: 1970/09/17 DOA: 11/09/2020 PCP: Andria Frames, PA-C   Brief Narrative: Molly [name] is a 50 y.o.  female with medical history significant of diabetes mellitus type 1, bipolar disorder, hyperlipidemia, insomnia. Patient presented secondary to a right knee septic arthritis with evidence of associated osteomyelitis on MRI.  Infectious disease and orthopedics consulted.  Orthopedics consulted and s/p debridement of right knee with placement of articulating antibiotic spacer 10/21.  Intractable nausea and vomiting.  [facility] was consulted on 10/23.  Postop acute blood loss anemia, transfusing PRBC.   Assessment & Plan:   Principal Problem:   Septic joint of right knee joint ([place]) Active Problems:   Type 1 diabetes mellitus (HCC)   Depression   Bipolar 1 disorder (HCC)   Nausea & vomiting   HLD (hyperlipidemia)   Unspecified severe protein-calorie malnutrition (HCC)   Protein-calorie malnutrition, severe   MSSA right native knee septic arthritis and acute osteomyelitis. -Follows with Dr. Percell Miller, orthopedics as outpatient - 3 months history of right knee pain, persistent and gradually worsening along with erythema and swelling.  Initially evaluated by orthopedics, work-up including DVT work-up was negative, received intra-articular steroid injection with temporary pain relief. - S/p joint aspiration in office week prior to admission by orthopedics-reportedly grew MSSA.  Subsequently hospitalized - S/p IR aspirated 1 mL of purulent and bloody synovial fluid on 10/18, gram stain shows gram-positive cocci, reintubated for better growth. - MRI right knee shows right knee septic arthritis with extensive changes of acute osteomyelitis involving the distal femur, proximal tibia and patella along with findings suggestive of right distal thigh myositis. - CRP 5.4.  ESR 94. - ID consulted and patient now on IV cefazolin - Dr.  Lyla Glassing, orthopedics input appreciated and s/p debridement of right knee with placement of articulating antibiotic spacer 10/21.. - Knee joint aspirate from 10/18: Abundant Staph aureus.  Surveillance blood cultures x2 from 10/19: Negative to date. - Orthopedics following and recommend 50% weightbearing with walker, knee immobilizer to be in place at all times.   -As per orthopedics, drain can be removed prior to discharge.  Also wound VAC to be changed to [place] at time of discharge and have cleared her for discharge. - As per ID follow-up, IV antibiotics through 12/24/2020 and has outpatient follow-up with Dr. Linus Salmons on 11/14  Nausea/intermittent vomiting -Reports 3 weeks symptoms.  Intermittent vomiting at home but none since hospitalization.  Reports some heartburn. - Maximize PPI, as needed Maalox, supportive treatment with antiemetics and monitor. - her nausea and vomiting had improved preop but now worse postop nausea.  We will add scopolamine patch. - Lithium level low.  Tegretol level in process. - Now worse postop/postop nausea.  Will add scopolamine patch. - No DKA. - Continue to report nausea and vomiting.  Thereby [facility] was consulted.  Underwent EGD 10/24 which showed gastritis, biopsied.  Clear diet and then advance as tolerated.  Dizziness/vertigo: - Patient reports onset since 2 nights but not really very clear.  States that she feels that she is spinning around. - CT head: No acute findings.  - Initiated meclizine as needed for vertigo.  Continues to report significant vertigo, worse with movement of head and neck and an upright position.  Vestibular rehab per PT-pending. - Lithium level low -MRI brain without acute findings.  Patient is overall a poor historian, also significant overlay of anxiety, unclear regarding accuracy of her symptoms.   Diabetes  mellitus, type 1 -Hemoglobin A1C of 8.5% from 8/22. Patient initially requested to manage insulin with her pump but now  her pump is not working. Novolog basal rate of 1.2 units per hour via pump. - Hemoglobin A1c 6.5 on 10/18 suggesting good outpatient control. -Started Semglee 25 units daily and Novolog 5 units TID with meals and sensitive SSI. - Postop, DM control had worsened likely related to a dose of steroids.  This is now improved and is in the high 100s.   Essential hypertension - Amlodipine increased this admission to 10 mg daily. - Metoprolol 25 Mg twice daily added.   - BP is better but still mildly uncontrolled.  Added as needed IV hydralazine.  Acute post op blood loss anemia complicating anemia suspect chronic disease. Unsure if acute or chronic. No history of bleeding. Recent outside records suggesting stable hemoglobin from earlier this month -Anemia panel reviewed: Iron 30, ferritin 301 - EBL at surgery: Approximately 450 mL. - Hemoglobin dropped from 10.1 preop to 7.8 g postop.  Repeat check on 10/22 PM was 7.2.  1 unit of PRBC was ordered but never given for unclear reasons.  Follow-up CBC 10/23 had hemoglobin of 6.4. - S/p 2 units PRBC on 10/23.  Hemoglobin up to 9.9.   Hyperlipidemia -Continue Crestor  Hypokalemia Replace IV and follow.  Magnesium 2.   Insomnia -Continue Ambien qHS   Depression Bipolar 1 disorder -Continue Tegretol, lamotrigine, lithium (doses verified with prescriber per pharmacy) and Geodon. - Lithium level low.  Tegretol level, total: 13.8, free: 3.6.  We will reduce p.m. dose of Tegretol marginally.  DVT prophylaxis: Lovenox Code Status:   Code Status: Full Code Family Communication: I discussed in detail with patient's friend via phone on 10/21, updated care and answered all questions.  She was appreciative of the call. Disposition Plan: Patient should be stable for discharge in the next 1 to 2 days pending toleration of oral diet.  TOC is initiated SNF search and insurance authorization.   Consultants:  Orthopedic surgery Interventional  radiology Infectious disease [facility]  Procedures:  RIGHT KNEE ASPIRATION (11/10/2020)  11/13/2020: 1) Open arthrotomy Right knee with excisional debridement of bone, cartilage, and synovium. 2. Placement of Prostalac articulating spacer right knee.3. Application of negative pressure incisional dressing. EGD 10/24.  Antimicrobials: As above.   Subjective: Seen this morning prior to EGD.  RN was in the room.  She now stated that she had not had nausea for several days which is not accurate because she complained of this yesterday is why GI was consulted.  Appears to be an inconsistent/unreliable historian.  She denied right knee postop pain.  Objective: Vitals:   11/16/20 1213 11/16/20 1331 11/16/20 1340 11/16/20 1349  BP: (!) 175/68 (!) 151/72 (!) 154/66 (!) 157/59  Pulse: 94 84 85 81  Resp: 20 (!) 22 (!) 28 19  Temp: 99.1 F (37.3 C) 98.3 F (36.8 C)    TempSrc: Oral Oral    SpO2: 100% 100% 100% 100%  Weight: 66.3 kg     Height: '5\' 7"'  (1.702 m)       Intake/Output Summary (Last 24 hours) at 11/16/2020 1630 Last data filed at 11/16/2020 1517 Gross per 24 hour  Intake 5677.95 ml  Output 2591 ml  Net 3086.95 ml    Filed Weights   11/10/20 0800 11/13/20 1501 11/16/20 1213  Weight: 66.3 kg 66.3 kg 66.3 kg    Examination:  General exam: Young female, moderately built and nourished sitting up  comfortably in bed without distress. Respiratory system: Clear to auscultation.  No increased work of breathing. Cardiovascular system: S1 and S2 heard, RRR.  No JVD, murmurs or pedal edema.   Gastrointestinal system: Abdomen nondistended, soft and nontender.  Normal bowel sounds heard. Central nervous system: Alert and oriented with no focal deficits.  Has a nasal twang to her voice.  No nystagmus appreciated. Musculoskeletal: Right knee pain knee immobilizer.  Drain visible with blood in it.  Right upper arm PICC line. Skin: No cyanosis. No rashes Psychiatry: Judgement and  insight appear normal. Mood & affect appropriate.     Data Reviewed: I have personally reviewed following labs and imaging studies  CBC Lab Results  Component Value Date   WBC 5.5 11/16/2020   RBC 3.36 (L) 11/16/2020   HGB 9.9 (L) 11/16/2020   HCT 30.6 (L) 11/16/2020   MCV 87.5 11/16/2020   MCH 28.6 11/16/2020   PLT 193 11/16/2020   MCHC 32.7 11/16/2020   RDW 15.4 11/16/2020   LYMPHSABS 0.9 11/09/2020   MONOABS 0.7 11/09/2020   EOSABS 0.0 11/09/2020   BASOSABS 0.0 53/66/4403     Last metabolic panel Lab Results  Component Value Date   NA 136 11/16/2020   K 3.2 (L) 11/16/2020   CL 104 11/16/2020   CO2 28 11/16/2020   BUN 5 (L) 11/16/2020   CREATININE 0.30 (L) 11/16/2020   GLUCOSE 163 (H) 11/16/2020   GFRNONAA >60 11/16/2020   GFRAA >60 11/23/2015   CALCIUM 8.4 (L) 11/16/2020   PHOS 2.1 (L) 11/28/2009   PROT 5.6 (L) 11/15/2020   ALBUMIN 2.4 (L) 11/15/2020   BILITOT 0.3 11/15/2020   ALKPHOS 69 11/15/2020   AST 13 (L) 11/15/2020   ALT 8 11/15/2020   ANIONGAP 4 (L) 11/16/2020    CBG (last 3)  Recent Labs    11/15/20 2032 11/16/20 0751 11/16/20 1144  GLUCAP 118* 174* 196*     GFR: Estimated Creatinine Clearance: 80.9 mL/min (A) (by C-G formula based on SCr of 0.3 mg/dL (L)).  Coagulation Profile: No results for input(s): INR, PROTIME in the last 168 hours.  Recent Results (from the past 240 hour(s))  Resp Panel by RT-PCR (Flu A&B, Covid) Nasopharyngeal Swab     Status: None   Collection Time: 11/09/20  9:30 PM   Specimen: Nasopharyngeal Swab; Nasopharyngeal(NP) swabs in vial transport medium  Result Value Ref Range Status   SARS Coronavirus 2 by RT PCR NEGATIVE NEGATIVE Final    Comment: (NOTE) SARS-CoV-2 target nucleic acids are NOT DETECTED.  The SARS-CoV-2 RNA is generally detectable in upper respiratory specimens during the acute phase of infection. The lowest concentration of SARS-CoV-2 viral copies this assay can detect is 138 copies/mL. A  negative result does not preclude SARS-Cov-2 infection and should not be used as the sole basis for treatment or other patient management decisions. A negative result may occur with  improper specimen collection/handling, submission of specimen other than nasopharyngeal swab, presence of viral mutation(s) within the areas targeted by this assay, and inadequate number of viral copies(<138 copies/mL). A negative result must be combined with clinical observations, patient history, and epidemiological information. The expected result is Negative.  Fact Sheet for Patients:  EntrepreneurPulse.com.au  Fact Sheet for Healthcare Providers:  IncredibleEmployment.be  This test is no t yet approved or cleared by the [place] FDA and  has been authorized for detection and/or diagnosis of SARS-CoV-2 by FDA under an Emergency Use Authorization (EUA). This EUA will remain  in  effect (meaning this test can be used) for the duration of the COVID-19 declaration under Section 564(b)(1) of the Act, 21 U.S.C.section 360bbb-3(b)(1), unless the authorization is terminated  or revoked sooner.       Influenza A by PCR NEGATIVE NEGATIVE Final   Influenza B by PCR NEGATIVE NEGATIVE Final    Comment: (NOTE) The Xpert Xpress SARS-CoV-2/FLU/RSV plus assay is intended as an aid in the diagnosis of influenza from Nasopharyngeal swab specimens and should not be used as a sole basis for treatment. Nasal washings and aspirates are unacceptable for Xpert Xpress SARS-CoV-2/FLU/RSV testing.  Fact Sheet for Patients: EntrepreneurPulse.com.au  Fact Sheet for Healthcare Providers: IncredibleEmployment.be  This test is not yet approved or cleared by the [place] FDA and has been authorized for detection and/or diagnosis of SARS-CoV-2 by FDA under an Emergency Use Authorization (EUA). This EUA will remain in effect (meaning this test can  be used) for the duration of the COVID-19 declaration under Section 564(b)(1) of the Act, 21 U.S.C. section 360bbb-3(b)(1), unless the authorization is terminated or revoked.  Performed at [facility], [place] [address] [place], [place] [address]   Body fluid culture w Gram Stain     Status: None   Collection Time: 11/10/20  9:10 AM   Specimen: KNEE; Body Fluid  Result Value Ref Range Status   Specimen Description   Final    KNEE RT KNEE JOINT Performed at [place] [address], [place], [place] [address]    Special Requests   Final    NONE Performed at [facility], [place] [address] [place], [place] [address]    Gram Stain   Final    MODERATE WBC PRESENT, PREDOMINANTLY PMN MODERATE GRAM POSITIVE COCCI Performed at [facility], [place] [address] [place], [place] [address]    Culture ABUNDANT STAPHYLOCOCCUS AUREUS  Final   Report Status 11/12/2020 FINAL  Final   Organism ID, Bacteria STAPHYLOCOCCUS AUREUS  Final      Susceptibility   Staphylococcus aureus - MIC*    CIPROFLOXACIN >=8 RESISTANT Resistant     ERYTHROMYCIN <=0.25 SENSITIVE Sensitive     GENTAMICIN <=0.5 SENSITIVE Sensitive     OXACILLIN <=0.25 SENSITIVE Sensitive     TETRACYCLINE <=1 SENSITIVE Sensitive     VANCOMYCIN <=0.5 SENSITIVE Sensitive     TRIMETH/SULFA <=10 SENSITIVE Sensitive     CLINDAMYCIN <=0.25 SENSITIVE Sensitive     RIFAMPIN <=0.5 SENSITIVE Sensitive     Inducible Clindamycin NEGATIVE Sensitive     * ABUNDANT STAPHYLOCOCCUS AUREUS  Anaerobic culture w Gram Stain     Status: None   Collection Time: 11/10/20  9:12 AM   Specimen: KNEE  Result Value Ref Range Status   Specimen Description   Final    KNEE RT KNEE JOINT Performed at [facility], [place] [address], [place], [place] [address]    Special Requests   Final    NONE Performed at [facility], [place] [address], [place], [place]  [address]    Gram Stain   Final    MODERATE WBC PRESENT, PREDOMINANTLY PMN MODERATE GRAM POSITIVE COCCI    Culture   Final    NO ANAEROBES ISOLATED Performed at [facility], [place] [address] [place], [place] [address]    Report Status 11/15/2020 FINAL  Final  Culture, blood (routine x 2)     Status: None   Collection Time: 11/10/20  2:22 PM  Specimen: BLOOD  Result Value Ref Range Status   Specimen Description   Final    BLOOD RIGHT ANTECUBITAL Performed at [place] [address], [place], [place] [address]    Special Requests   Final    BOTTLES DRAWN AEROBIC AND ANAEROBIC Blood Culture adequate volume Performed at [place] [address] [place], [place] [address]    Culture   Final    NO GROWTH 5 DAYS Performed at [facility], [place] [address] [place], [place] [address]    Report Status 11/15/2020 FINAL  Final  Culture, blood (routine x 2)     Status: None   Collection Time: 11/10/20  2:22 PM   Specimen: BLOOD  Result Value Ref Range Status   Specimen Description   Final    BLOOD RIGHT ANTECUBITAL Performed at [place] [address], [place], [place] [address]    Special Requests   Final    BOTTLES DRAWN AEROBIC AND ANAEROBIC Blood Culture adequate volume Performed at [place] [address] [place], [place] [address]    Culture   Final    NO GROWTH 5 DAYS Performed at [facility], [place] [address] [place], [place] [address]    Report Status 11/15/2020 FINAL  Final  Surgical pcr screen     Status: Abnormal   Collection Time: 11/13/20  6:25 AM  Result Value Ref Range Status   MRSA, PCR NEGATIVE NEGATIVE Final   Staphylococcus aureus POSITIVE (A) NEGATIVE Final    Comment: (NOTE) The Xpert SA Assay (FDA approved for NASAL specimens in patients 39 years of age and older), is one component of a comprehensive surveillance program. It is not intended to  diagnose infection nor to guide or monitor treatment. Performed at [facility], [place] [address], [place], [place] [address]   Aerobic/Anaerobic Culture w Gram Stain (surgical/deep wound)     Status: None (Preliminary result)   Collection Time: 11/13/20  5:31 PM   Specimen: Wound  Result Value Ref Range Status   Specimen Description JOINT FLUID RIGHT KNEE  Final   Special Requests SWABS  Final   Gram Stain   Final    NO SQUAMOUS EPITHELIAL CELLS SEEN FEW WBC SEEN NO ORGANISMS SEEN    Culture   Final    RARE STAPHYLOCOCCUS AUREUS NO ANAEROBES ISOLATED; CULTURE IN PROGRESS FOR 5 DAYS SUSCEPTIBILITIES TO FOLLOW Performed at [facility], [place] [address], [place], [place] [address]    Report Status PENDING  Incomplete  Aerobic/Anaerobic Culture w Gram Stain (surgical/deep wound)     Status: None (Preliminary result)   Collection Time: 11/13/20  5:48 PM   Specimen: Bone; Tissue  Result Value Ref Range Status   Specimen Description   Final    BONE RIGHT PROXIMAL TIBIA Performed at [place] [address] [place], [place] [address]    Special Requests   Final    NONE Performed at [facility], [place] [address], [place], [place] [address]    Gram Stain   Final    NO SQUAMOUS EPITHELIAL CELLS SEEN FEW WBC SEEN NO ORGANISMS SEEN Performed at [facility], [place] [address], [place],  [address]    Culture   Final    RARE STAPHYLOCOCCUS AUREUS RARE STAPHYLOCOCCUS CAPITIS SUSCEPTIBILITIES TO FOLLOW NO ANAEROBES ISOLATED; CULTURE IN PROGRESS FOR 5 DAYS    Report Status PENDING  Incomplete         Radiology Studies: No results found.      Scheduled Meds:  sodium chloride   Intravenous Once   sodium chloride   Intravenous Once   amLODipine  10 mg Oral Daily   aspirin  81 mg Oral BID   carbamazepine  1,000 mg Oral QHS   carbamazepine  200 mg Oral q morning   Chlorhexidine Gluconate Cloth  6  each Topical Q0600   docusate sodium  100 mg Oral BID   feeding supplement (GLUCERNA SHAKE)  237 mL Oral TID BM   [START ON 11/17/2020] influenza vac split quadrivalent PF  0.5 mL Intramuscular Tomorrow-1000   insulin aspart  0-5 Units Subcutaneous QHS   insulin aspart  0-9 Units Subcutaneous TID WC   insulin aspart  5 Units Subcutaneous TID WC   insulin glargine-yfgn  25 Units Subcutaneous Daily   lamoTRIgine  400 mg Oral Daily   lithium carbonate  300 mg Oral QPM   metoprolol tartrate  12.5 mg Oral BID   multivitamin with minerals  1 tablet Oral Daily   mupirocin ointment  1 application Nasal BID   pantoprazole  40 mg Oral Daily   rosuvastatin  40 mg Oral Daily   scopolamine  1 patch Transdermal Q72H   senna  1 tablet Oral BID   sodium chloride flush  10-40 mL Intracatheter Q12H   ziprasidone  120 mg Oral QHS   zolpidem  5 mg Oral QHS   Continuous Infusions:  sodium chloride Stopped (11/16/20 1200)    ceFAZolin (ANCEF) IV 2 g (11/16/20 1606)     LOS: 7 days    Vernell Leep, MD, [address], [facility]. [facility]  To contact the attending provider between 7A-7P or the covering provider during after hours 7P-7A, please log into the web site [email or web address] and access using universal [place] password for that web site. If you do not have the password, please call the hospital operator.

## 2020-11-16 NOTE — Brief Op Note (Addendum)
Mild distal gastritis. Desquamation of GEJ from recent vomiting. No Mallory-Weiss tear or peptic ulcers seen. Clear liquid diet and advance as tolerated. F/U on gastric biopsies as outpt. Outpt colonoscopy in near future. Patient wants a regular diet not clear liquid diet. With her N/V will change to soft diet and can advance as tolerated. Will sign off. Call if questions.

## 2020-11-16 NOTE — Progress Notes (Signed)
[facility]   Reason for visit: Follow up on septic arthritis  Interval History: overall feeling better.  Underwent debridement on 10/21 with excisional debridement of bone, cartilage and synovium with spacer placement.   Tmax 100.3.  WBC wnl today.  For endoscopy today.   Physical Exam: Constitutional:  Vitals:   11/16/20 0329 11/16/20 1213  BP: 140/64 (!) 175/68  Pulse: 89 94  Resp: 20 20  Temp: 98.3 F (36.8 C) 99.1 F (37.3 C)  SpO2: 100% 100%   patient appears in NAD Respiratory: Normal respiratory effort; CTA B Cardiovascular: RRR GI: soft, nt, nd  Review of Systems: Constitutional: negative for fevers and chills Integument/breast: negative for rash  Lab Results  Component Value Date   WBC 5.5 11/16/2020   HGB 9.9 (L) 11/16/2020   HCT 30.6 (L) 11/16/2020   MCV 87.5 11/16/2020   PLT 193 11/16/2020    Lab Results  Component Value Date   CREATININE 0.30 (L) 11/16/2020   BUN 5 (L) 11/16/2020   NA 136 11/16/2020   K 3.2 (L) 11/16/2020   CL 104 11/16/2020   CO2 28 11/16/2020    Lab Results  Component Value Date   ALT 8 11/15/2020   AST 13 (L) 11/15/2020   ALKPHOS 69 11/15/2020     Microbiology: Recent Results (from the past 240 hour(s))  Resp Panel by RT-PCR (Flu A&B, Covid) Nasopharyngeal Swab     Status: None   Collection Time: 11/09/20  9:30 PM   Specimen: Nasopharyngeal Swab; Nasopharyngeal(NP) swabs in vial transport medium  Result Value Ref Range Status   SARS Coronavirus 2 by RT PCR NEGATIVE NEGATIVE Final    Comment: (NOTE) SARS-CoV-2 target nucleic acids are NOT DETECTED.  The SARS-CoV-2 RNA is generally detectable in upper respiratory specimens during the acute phase of infection. The lowest concentration of SARS-CoV-2 viral copies this assay can detect is 138 copies/mL. A negative result does not preclude SARS-Cov-2 infection and should not be used as the sole basis for treatment or other patient management  decisions. A negative result may occur with  improper specimen collection/handling, submission of specimen other than nasopharyngeal swab, presence of viral mutation(s) within the areas targeted by this assay, and inadequate number of viral copies(<138 copies/mL). A negative result must be combined with clinical observations, patient history, and epidemiological information. The expected result is Negative.  Fact Sheet for Patients:  EntrepreneurPulse.com.au  Fact Sheet for Healthcare Providers:  IncredibleEmployment.be  This test is no t yet approved or cleared by the [place] FDA and  has been authorized for detection and/or diagnosis of SARS-CoV-2 by FDA under an Emergency Use Authorization (EUA). This EUA will remain  in effect (meaning this test can be used) for the duration of the COVID-19 declaration under Section 564(b)(1) of the Act, 21 U.S.C.section 360bbb-3(b)(1), unless the authorization is terminated  or revoked sooner.       Influenza A by PCR NEGATIVE NEGATIVE Final   Influenza B by PCR NEGATIVE NEGATIVE Final    Comment: (NOTE) The Xpert Xpress SARS-CoV-2/FLU/RSV plus assay is intended as an aid in the diagnosis of influenza from Nasopharyngeal swab specimens and should not be used as a sole basis for treatment. Nasal washings and aspirates are unacceptable for Xpert Xpress SARS-CoV-2/FLU/RSV testing.  Fact Sheet for Patients: EntrepreneurPulse.com.au  Fact Sheet for Healthcare Providers: IncredibleEmployment.be  This test is not yet approved or cleared by the [place] FDA and has been authorized for detection and/or  diagnosis of SARS-CoV-2 by FDA under an Emergency Use Authorization (EUA). This EUA will remain in effect (meaning this test can be used) for the duration of the COVID-19 declaration under Section 564(b)(1) of the Act, 21 U.S.C. section 360bbb-3(b)(1), unless the  authorization is terminated or revoked.  Performed at [facility], [place] [address] [place], [place] [address]   Body fluid culture w Gram Stain     Status: None   Collection Time: 11/10/20  9:10 AM   Specimen: KNEE; Body Fluid  Result Value Ref Range Status   Specimen Description   Final    KNEE RT KNEE JOINT Performed at [facility] [address], [place], [place] [address]    Special Requests   Final    NONE Performed at [facility], [place] [address] [place], [place] [address]    Gram Stain   Final    MODERATE WBC PRESENT, PREDOMINANTLY PMN MODERATE GRAM POSITIVE COCCI Performed at [facility], [place] [address], [place], [place] [address]    Culture ABUNDANT STAPHYLOCOCCUS AUREUS  Final   Report Status 11/12/2020 FINAL  Final   Organism ID, Bacteria STAPHYLOCOCCUS AUREUS  Final      Susceptibility   Staphylococcus aureus - MIC*    CIPROFLOXACIN >=8 RESISTANT Resistant     ERYTHROMYCIN <=0.25 SENSITIVE Sensitive     GENTAMICIN <=0.5 SENSITIVE Sensitive     OXACILLIN <=0.25 SENSITIVE Sensitive     TETRACYCLINE <=1 SENSITIVE Sensitive     VANCOMYCIN <=0.5 SENSITIVE Sensitive     TRIMETH/SULFA <=10 SENSITIVE Sensitive     CLINDAMYCIN <=0.25 SENSITIVE Sensitive     RIFAMPIN <=0.5 SENSITIVE Sensitive     Inducible Clindamycin NEGATIVE Sensitive     * ABUNDANT STAPHYLOCOCCUS AUREUS  Anaerobic culture w Gram Stain     Status: None   Collection Time: 11/10/20  9:12 AM   Specimen: KNEE  Result Value Ref Range Status   Specimen Description   Final    KNEE RT KNEE JOINT Performed at [facility], [place] [address] [place], [place] [address]    Special Requests   Final    NONE Performed at [facility], [place] [address], [place], [place] [address]    Gram Stain   Final    MODERATE WBC PRESENT, PREDOMINANTLY PMN MODERATE GRAM POSITIVE COCCI    Culture   Final    NO ANAEROBES  ISOLATED Performed at [facility], [place] [address] [place], [place] [address]    Report Status 11/15/2020 FINAL  Final  Culture, blood (routine x 2)     Status: None   Collection Time: 11/10/20  2:22 PM   Specimen: BLOOD  Result Value Ref Range Status   Specimen Description   Final    BLOOD RIGHT ANTECUBITAL Performed at [facility] [address], [place], [place] [address]    Special Requests   Final    BOTTLES DRAWN AEROBIC AND ANAEROBIC Blood Culture adequate volume Performed at [place] [address], [place], [place] [address]    Culture   Final    NO GROWTH 5 DAYS Performed at [facility], [place] [address], [place], [place] [address]    Report Status 11/15/2020 FINAL  Final  Culture, blood (routine x 2)     Status: None   Collection Time: 11/10/20  2:22 PM   Specimen: BLOOD  Result Value Ref Range Status  Specimen Description   Final    BLOOD RIGHT ANTECUBITAL Performed at [place] [facility] [address] [place], [place] [address]    Special Requests   Final    BOTTLES DRAWN AEROBIC AND ANAEROBIC Blood Culture adequate volume Performed at [facility] [address], [place], [place] [address]    Culture   Final    NO GROWTH 5 DAYS Performed at [facility], [place] [address], [place], [place] [address]    Report Status 11/15/2020 FINAL  Final  Surgical pcr screen     Status: Abnormal   Collection Time: 11/13/20  6:25 AM  Result Value Ref Range Status   MRSA, PCR NEGATIVE NEGATIVE Final   Staphylococcus aureus POSITIVE (A) NEGATIVE Final    Comment: (NOTE) The Xpert SA Assay (FDA approved for NASAL specimens in patients 4 years of age and older), is one component of a comprehensive surveillance program. It is not intended to diagnose infection nor to guide or monitor treatment. Performed at [facility], [place] [address] [place], [place]  [address]   Aerobic/Anaerobic Culture w Gram Stain (surgical/deep wound)     Status: None (Preliminary result)   Collection Time: 11/13/20  5:31 PM   Specimen: Wound  Result Value Ref Range Status   Specimen Description JOINT FLUID RIGHT KNEE  Final   Special Requests SWABS  Final   Gram Stain   Final    NO SQUAMOUS EPITHELIAL CELLS SEEN FEW WBC SEEN NO ORGANISMS SEEN    Culture   Final    RARE STAPHYLOCOCCUS AUREUS NO ANAEROBES ISOLATED; CULTURE IN PROGRESS FOR 5 DAYS SUSCEPTIBILITIES TO FOLLOW Performed at [facility], [place] [address] [place], [place] [address]    Report Status PENDING  Incomplete  Aerobic/Anaerobic Culture w Gram Stain (surgical/deep wound)     Status: None (Preliminary result)   Collection Time: 11/13/20  5:48 PM   Specimen: Bone; Tissue  Result Value Ref Range Status   Specimen Description   Final    BONE RIGHT PROXIMAL TIBIA Performed at [facility] [address] [place], [place] [address]    Special Requests   Final    NONE Performed at [facility], [place] [address], [place], [place] [address]    Gram Stain   Final    NO SQUAMOUS EPITHELIAL CELLS SEEN FEW WBC SEEN NO ORGANISMS SEEN    Culture   Final    RARE STAPHYLOCOCCUS AUREUS RARE STAPHYLOCOCCUS CAPITIS SUSCEPTIBILITIES TO FOLLOW Performed at [facility], [place] [address], [place],  [address]    Report Status PENDING  Incomplete    Impression/Plan:  1. Septic arthritis - growth with MSSA and on cefazolin.  Extensive infection requiring debridement and resection of bone with spacer placement.  Will need   She will need prolonged IV cefazolin for 6 weeks from the time of surgery, longer as indicated clinically.  She is in agreement with the plan.    2.  Access - picc line in place and plan discussed with the patient.    3.  N/V - for endoscopy today.  4.  Disposition - will need prolonged antibiotics as above. Unclear if going to  rehab first or home.  Antibiotics through December 1st.  If going home, will need OPAT and home health.   Diagnosis: Septic arthritis of native joint  Culture Result:MSSA  Allergies  Allergen Reactions   Varenicline Tartrate  REACTION: rash    OPAT Orders Discharge antibiotics to be given via PICC line Discharge antibiotics: cefazolin 2 grams three times a day Per pharmacy protocol yes Duration: 6 weeks End Date: 12/24/20  [facility] Care Per Protocol: yes  Home health RN for IV administration and teaching; PICC line care and labs.    Labs weekly while on IV antibiotics: _x_ CBC with differential __ BMP __x CMP __x CRP _x_ ESR __ Vancomycin trough __ CK  __ Please pull PIC at completion of IV antibiotics __x Please leave PIC in place until doctor has seen patient or been notified  Fax weekly labs to [phone]  Clinic Follow Up Appt: 11/14 with Dr. Linus Salmons - can be a virtual visit if wanted  @ 11:15

## 2020-11-16 NOTE — Progress Notes (Signed)
PT Cancellation Note  Patient Details Name: Molly [name] MRN: [id number] DOB: [birth year]   Cancelled Treatment:    Reason Eval/Treat Not Completed: Medical issues which prohibited therapy, will follow up tomorrow.   Rada Hay 11/16/2020, 3:12 PM Blanchard Kelch PT Acute Rehabilitation Services Pager [phone] Office [phone]

## 2020-11-16 NOTE — Anesthesia Preprocedure Evaluation (Signed)
Anesthesia Evaluation  Patient identified by MRN, date of birth, ID band Patient awake    Reviewed: Allergy & Precautions, NPO status , Patient's Chart, lab work & pertinent test results  Airway Mallampati: II  TM Distance: >3 FB Neck ROM: Full    Dental   Pulmonary former smoker,    breath sounds clear to auscultation       Cardiovascular hypertension, Pt. on medications  Rhythm:Regular Rate:Normal     Neuro/Psych  Headaches,  Neuromuscular disease    GI/Hepatic negative GI ROS, Neg liver ROS,   Endo/Other  diabetes, Type 1, Insulin Dependent  Renal/GU negative Renal ROS     Musculoskeletal  (+) Arthritis ,   Abdominal   Peds  Hematology  (+) anemia ,   Anesthesia Other Findings   Reproductive/Obstetrics                             Anesthesia Physical Anesthesia Plan  ASA: 3  Anesthesia Plan: MAC   Post-op Pain Management:    Induction:   PONV Risk Score and Plan: 2 and Propofol infusion and Treatment may vary due to age or medical condition  Airway Management Planned: Natural Airway and Nasal Cannula  Additional Equipment:   Intra-op Plan:   Post-operative Plan:   Informed Consent: I have reviewed the patients History and Physical, chart, labs and discussed the procedure including the risks, benefits and alternatives for the proposed anesthesia with the patient or authorized representative who has indicated his/her understanding and acceptance.       Plan Discussed with: CRNA  Anesthesia Plan Comments:         Anesthesia Quick Evaluation

## 2020-11-16 NOTE — Interval H&P Note (Signed)
History and Physical Interval Note:  11/16/2020 12:37 PM  Molly [name]  has presented today for surgery, with the diagnosis of nausea, vomiting.  The various methods of treatment have been discussed with the patient and family. After consideration of risks, benefits and other options for treatment, the patient has consented to  Procedure(s): ESOPHAGOGASTRODUODENOSCOPY (EGD) WITH PROPOFOL (N/A) as a surgical intervention.  The patient's history has been reviewed, patient examined, no change in status, stable for surgery.  I have reviewed the patient's chart and labs.  Questions were answered to the patient's satisfaction.     Shirley Friar

## 2020-11-16 NOTE — Op Note (Signed)
[facility] Patient Name: Molly [name] Procedure Date: 11/16/2020 MRN: [id number] Attending MD: Lear Ng , MD Date of Birth: 04/12/1970 CSN: [id number] Age: 50 Admit Type: Inpatient Procedure:                Upper GI endoscopy Indications:              Nausea with vomiting Providers:                Lear Ng, MD, Burtis Junes, RN, Benetta Spar, Technician, Hinton Dyer Referring MD:             hospital team Medicines:                Propofol per Anesthesia, Monitored Anesthesia Care Complications:            No immediate complications. Estimated Blood Loss:     Estimated blood loss was minimal. Procedure:                Pre-Anesthesia Assessment:                           - Prior to the procedure, a History and Physical                            was performed, and patient medications and                            allergies were reviewed. The patient's tolerance of                            previous anesthesia was also reviewed. The risks                            and benefits of the procedure and the sedation                            options and risks were discussed with the patient.                            All questions were answered, and informed consent                            was obtained. Prior Anticoagulants: The patient has                            taken no previous anticoagulant or antiplatelet                            agents. ASA Grade Assessment: III - A patient with                            severe systemic disease. After reviewing the risks  and benefits, the patient was deemed in                            satisfactory condition to undergo the procedure.                           After obtaining informed consent, the endoscope was                            passed under direct vision. Throughout the                            procedure, the patient's blood pressure,  pulse, and                            oxygen saturations were monitored continuously. The                            [id number]-[id number] ([id number]) Olympus endoscope was introduced                            through the mouth, and advanced to the second part                            of duodenum. The upper GI endoscopy was                            accomplished without difficulty. The patient                            tolerated the procedure well. Scope In: Scope Out: Findings:      The Z-line was irregular and was found 40 cm from the incisors.      The exam of the esophagus was otherwise normal.      Segmental mild inflammation characterized by congestion (edema) and       erythema was found in the prepyloric region of the stomach. Biopsies       were taken with a cold forceps for histology. Estimated blood loss was       minimal.      The cardia and gastric fundus were normal on retroflexion.      The examined duodenum was normal. Impression:               - Z-line irregular, 40 cm from the incisors.                           - Gastritis. Biopsied.                           - Normal examined duodenum. Moderate Sedation:      N/A - MAC procedure Recommendation:           - Advance diet as tolerated and clear liquid diet.                           - Await pathology results. Procedure Code(s):        ---  Professional ---                           [phone], Esophagogastroduodenoscopy, flexible,                            transoral; with biopsy, single or multiple Diagnosis Code(s):        --- Professional ---                           K29.70, Gastritis, unspecified, without bleeding                           K22.8, Other specified diseases of esophagus                           R11.2, Nausea with vomiting, unspecified CPT copyright 2019 American Medical Association. All rights reserved. The codes documented in this report are preliminary and upon coder review may  be revised to meet current  compliance requirements. Lear Ng, MD 11/16/2020 1:37:34 PM This report has been signed electronically. Number of Addenda: 0

## 2020-11-16 NOTE — Anesthesia Postprocedure Evaluation (Signed)
Anesthesia Post Note  Patient: Molly [name]  Procedure(s) Performed: ESOPHAGOGASTRODUODENOSCOPY (EGD) WITH PROPOFOL BIOPSY     Patient location during evaluation: PACU Anesthesia Type: MAC Level of consciousness: awake and alert Pain management: pain level controlled Vital Signs Assessment: post-procedure vital signs reviewed and stable Respiratory status: spontaneous breathing, nonlabored ventilation, respiratory function stable and patient connected to nasal cannula oxygen Cardiovascular status: stable and blood pressure returned to baseline Postop Assessment: no apparent nausea or vomiting Anesthetic complications: no   No notable events documented.  Last Vitals:  Vitals:   11/16/20 1340 11/16/20 1349  BP: (!) 154/66 (!) 157/59  Pulse: 85 81  Resp: (!) 28 19  Temp:    SpO2: 100% 100%    Last Pain:  Vitals:   11/16/20 1331  TempSrc: Oral  PainSc: 0-No pain                 Tiajuana Amass

## 2020-11-16 NOTE — [place] FL2 (Addendum)
[place] MEDICAID FL2 LEVEL OF CARE SCREENING TOOL     IDENTIFICATION  Patient Name: Molly [name] Birthdate: Mar 09, 1970 Sex: female Admission Date (Current Location): 11/09/2020  Uh Yanelle Sousa [facility] and [place] Number:  Producer, television/film/video and Address:  [facility],  [address] [place], [place] [address]      Provider Number: [id number]  Attending Physician Name and Address:  Elease Etienne, MD  Relative Name and Phone Number:  Jerrel Ivory (Friend)   [phone]    Current Level of Care: Hospital Recommended Level of Care: Skilled Nursing Facility Prior Approval Number:    Date Approved/Denied:   PASRR Number: [id number]  Discharge Plan: SNF    Current Diagnoses: Patient Active Problem List   Diagnosis Date Noted   Protein-calorie malnutrition, severe 11/11/2020   Unspecified severe protein-calorie malnutrition (HCC) 11/10/2020   Septic joint of right knee joint (HCC) 11/09/2020   Sinusitis 04/20/2017   Otalgia of both ears 03/30/2016   Strain of chest wall 11/30/2015   URI (upper respiratory infection) 11/12/2015   Amenorrhea, secondary 03/02/2015   Recurrent acute sinusitis 05/23/2014   Anemia 04/08/2014   Diabetic ketoacidosis without coma associated with type 1 diabetes mellitus (HCC)    HLD (hyperlipidemia) 04/07/2014   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   Fever 04/07/2014   DKA (diabetic ketoacidoses) 04/06/2014   Nausea & vomiting 04/06/2014   Bipolar 1 disorder (HCC)    Pain in joint, upper arm 10/22/2013   Routine general medical examination at a health care facility 10/04/2013   Visit for occupational health examination 11/10/2012   Nonspecific abnormal electrocardiogram (ECG) (EKG) 10/02/2012   Osteoporosis 10/02/2012   Quit smoking 04/24/2012   Arthralgia of hand, left 12/01/2011   Encounter for long-term (current) use of other medications 09/14/2010   HEMATURIA UNSPECIFIED 08/11/2009   PURE HYPERCHOLESTEROLEMIA 08/05/2008    THROMBOCYTOPENIA 05/07/2008   CONTUSION, ANKLE 08/28/2007   TINEA VERSICOLOR 07/13/2007   SEBACEOUS CYST, INFECTED 07/13/2007   COUGH 04/18/2007   Migraine 03/06/2007   Seasonal and perennial allergic rhinitis 03/06/2007   Type 1 diabetes mellitus (HCC) 08/03/2006   Depression 08/03/2006    Orientation RESPIRATION BLADDER Height & Weight     Self, Time, Situation, Place  Normal External catheter Weight: 66.3 kg Height:  5\' 7"  (170.2 cm)  BEHAVIORAL SYMPTOMS/MOOD NEUROLOGICAL BOWEL NUTRITION STATUS      Continent Diet (see d/c summary)  AMBULATORY STATUS COMMUNICATION OF NEEDS Skin   Extensive Assist Verbally Surgical wounds, Other (Comment) (Wound-R Knee, anterior; Closed system drain-R knee)                       Personal Care Assistance Level of Assistance  Bathing, Feeding, Dressing Bathing Assistance: Maximum assistance Feeding assistance: Independent Dressing Assistance: Limited assistance     Functional Limitations Info  Sight, Hearing, Speech Sight Info: Adequate Hearing Info: Adequate Speech Info: Adequate    SPECIAL CARE FACTORS FREQUENCY  PT (By licensed PT), OT (By licensed OT)     PT Frequency: 5X/;W OT Frequency: 5X/W            Contractures Contractures Info: Not present    Additional Factors Info  Code Status Code Status Info: Full    Allergies: Varenicline Tratrate         Current Medications (11/16/2020):  This is the current hospital active medication list Current Facility-Administered Medications  Medication Dose Route Frequency Provider Last Rate Last Admin   0.9 %  sodium chloride infusion (Manually  program via Guardrails IV Fluids)   Intravenous Once Charlott Rakes, MD       0.9 %  sodium chloride infusion (Manually program via Guardrails IV Fluids)   Intravenous Once Charlott Rakes, MD       0.9 %  sodium chloride infusion   Intravenous Continuous Charlott Rakes, MD   Stopped at 11/16/20 1200   alum & mag  hydroxide-simeth (MAALOX/MYLANTA) 200-200-20 MG/5ML suspension 30 mL  30 mL Oral Q4H PRN Charlott Rakes, MD   30 mL at 11/14/20 2039   amLODipine (NORVASC) tablet 10 mg  10 mg Oral Daily Charlott Rakes, MD   10 mg at 11/16/20 1111   aspirin chewable tablet 81 mg  81 mg Oral BID Charlott Rakes, MD   81 mg at 11/16/20 1108   carbamazepine (TEGRETOL) tablet 1,000 mg  1,000 mg Oral QHS Charlott Rakes, MD   1,000 mg at 11/15/20 2106   carbamazepine (TEGRETOL) tablet 200 mg  200 mg Oral q morning Charlott Rakes, MD   200 mg at 11/16/20 1109   ceFAZolin (ANCEF) IVPB 2g/100 mL premix  2 g Intravenous Alm Bustard, MD   Stopped at 11/16/20 8882   Chlorhexidine Gluconate Cloth 2 % PADS 6 each  6 each Topical Q0600 Charlott Rakes, MD   6 each at 11/16/20 8003   diphenhydrAMINE (BENADRYL) 12.5 MG/5ML elixir 12.5-25 mg  12.5-25 mg Oral Q4H PRN Charlott Rakes, MD       docusate sodium (COLACE) capsule 100 mg  100 mg Oral BID Charlott Rakes, MD   100 mg at 11/16/20 1108   feeding supplement (GLUCERNA SHAKE) (GLUCERNA SHAKE) liquid 237 mL  237 mL Oral TID BM Charlott Rakes, MD   237 mL at 11/15/20 1407   hydrALAZINE (APRESOLINE) injection 10 mg  10 mg Intravenous Q6H PRN Charlott Rakes, MD       [START ON 11/17/2020] influenza vac split quadrivalent PF (FLUARIX) injection 0.5 mL  0.5 mL Intramuscular Tomorrow-1000 Hongalgi, Anand D, MD       insulin aspart (novoLOG) injection 0-5 Units  0-5 Units Subcutaneous QHS Charlott Rakes, MD       insulin aspart (novoLOG) injection 0-9 Units  0-9 Units Subcutaneous TID WC Charlott Rakes, MD   2 Units at 11/16/20 1203   insulin aspart (novoLOG) injection 5 Units  5 Units Subcutaneous TID WC Charlott Rakes, MD   5 Units at 11/15/20 1720   insulin glargine-yfgn (SEMGLEE) injection 25 Units  25 Units Subcutaneous Daily Charlott Rakes, MD   25 Units at 11/16/20 1109   lamoTRIgine (LAMICTAL) tablet 400 mg  400 mg Oral Daily  Charlott Rakes, MD   400 mg at 11/16/20 1107   lithium carbonate (LITHOBID) CR tablet 300 mg  300 mg Oral QPM Charlott Rakes, MD   300 mg at 11/15/20 1714   LORazepam (ATIVAN) injection 1 mg  1 mg Intravenous Q8H PRN Charlott Rakes, MD   1 mg at 11/14/20 2033   meclizine (ANTIVERT) tablet 25 mg  25 mg Oral TID PRN Charlott Rakes, MD   25 mg at 11/12/20 2205   menthol-cetylpyridinium (CEPACOL) lozenge 3 mg  1 lozenge Oral PRN Charlott Rakes, MD       Or   phenol (CHLORASEPTIC) mouth spray 1 spray  1 spray Mouth/Throat PRN Charlott Rakes, MD       metoCLOPramide (REGLAN) tablet 5-10 mg  5-10 mg Oral Q8H PRN Samson Frederic, MD       Or   metoCLOPramide (REGLAN) injection  5-10 mg  5-10 mg Intravenous Q8H PRN Samson Frederic, MD   10 mg at 11/14/20 2032   metoprolol tartrate (LOPRESSOR) tablet 12.5 mg  12.5 mg Oral BID Charlott Rakes, MD   12.5 mg at 11/16/20 1112   multivitamin with minerals tablet 1 tablet  1 tablet Oral Daily Charlott Rakes, MD   1 tablet at 11/16/20 1108   mupirocin ointment (BACTROBAN) 2 % 1 application  1 application Nasal BID Charlott Rakes, MD   1 application at 11/16/20 1118   ondansetron (ZOFRAN) tablet 4 mg  4 mg Oral Q6H PRN Swinteck, Arlys John, MD       Or   ondansetron [facility]) injection 4 mg  4 mg Intravenous Q6H PRN Samson Frederic, MD   4 mg at 11/14/20 1830   oxyCODONE-acetaminophen (PERCOCET/ROXICET) 5-325 MG per tablet 1-2 tablet  1-2 tablet Oral Q4H PRN Charlott Rakes, MD   2 tablet at 11/14/20 0506   pantoprazole (PROTONIX) EC tablet 40 mg  40 mg Oral Daily Charlott Rakes, MD   40 mg at 11/16/20 1108   polyethylene glycol (MIRALAX / GLYCOLAX) packet 17 g  17 g Oral Daily PRN Charlott Rakes, MD       rosuvastatin (CRESTOR) tablet 40 mg  40 mg Oral Daily Charlott Rakes, MD   40 mg at 11/13/20 1002   scopolamine (TRANSDERM-SCOP) 1 MG/3DAYS 1.5 mg  1 patch Transdermal Natale Lay, MD   1.5 mg at 11/14/20 1700   senna  (SENOKOT) tablet 8.6 mg  1 tablet Oral BID Charlott Rakes, MD   8.6 mg at 11/16/20 1108   sodium chloride flush (NS) 0.9 % injection 10-40 mL  10-40 mL Intracatheter Q12H Charlott Rakes, MD   10 mL at 11/15/20 2109   sodium chloride flush (NS) 0.9 % injection 10-40 mL  10-40 mL Intracatheter PRN Charlott Rakes, MD       ziprasidone (GEODON) capsule 120 mg  120 mg Oral QHS Charlott Rakes, MD   120 mg at 11/15/20 2106   zolpidem (AMBIEN) tablet 5 mg  5 mg Oral QHS Charlott Rakes, MD   5 mg at 11/15/20 2107     Discharge Medications: Please see discharge summary for a list of discharge medications.  Relevant Imaging Results:  Relevant Lab Results:   Additional Information [phone] [place], [place]

## 2020-11-17 DIAGNOSIS — M00061 Staphylococcal arthritis, right knee: Secondary | ICD-10-CM | POA: Diagnosis not present

## 2020-11-17 LAB — TYPE AND SCREEN
ABO/RH(D): A POS
Antibody Screen: NEGATIVE
Unit division: 0
Unit division: 0
Unit division: 0

## 2020-11-17 LAB — BPAM RBC
Blood Product Expiration Date: 202211152359
Blood Product Expiration Date: 202211152359
Blood Product Expiration Date: 202211152359
ISSUE DATE / TIME: 202210230524
ISSUE DATE / TIME: 202210231050
Unit Type and Rh: 6200
Unit Type and Rh: 6200
Unit Type and Rh: 6200

## 2020-11-17 LAB — CBC
HCT: 32 % — ABNORMAL LOW (ref 36.0–46.0)
Hemoglobin: 10.3 g/dL — ABNORMAL LOW (ref 12.0–15.0)
MCH: 28.8 pg (ref 26.0–34.0)
MCHC: 32.2 g/dL (ref 30.0–36.0)
MCV: 89.4 fL (ref 80.0–100.0)
Platelets: 239 10*3/uL (ref 150–400)
RBC: 3.58 MIL/uL — ABNORMAL LOW (ref 3.87–5.11)
RDW: 15.4 % (ref 11.5–15.5)
WBC: 4.1 10*3/uL (ref 4.0–10.5)
nRBC: 0 % (ref 0.0–0.2)

## 2020-11-17 LAB — BASIC METABOLIC PANEL
Anion gap: 7 (ref 5–15)
BUN: 6 mg/dL (ref 6–20)
CO2: 28 mmol/L (ref 22–32)
Calcium: 8.4 mg/dL — ABNORMAL LOW (ref 8.9–10.3)
Chloride: 99 mmol/L (ref 98–111)
Creatinine, Ser: 0.36 mg/dL — ABNORMAL LOW (ref 0.44–1.00)
GFR, Estimated: >60 mL/min (ref >60)
Glucose, Bld: 154 mg/dL — ABNORMAL HIGH (ref 70–99)
Potassium: 3.4 mmol/L — ABNORMAL LOW (ref 3.5–5.1)
Sodium: 134 mmol/L — ABNORMAL LOW (ref 135–145)

## 2020-11-17 LAB — GLUCOSE, CAPILLARY
Glucose-Capillary: 146 mg/dL — ABNORMAL HIGH (ref 70–99)
Glucose-Capillary: 135 mg/dL — ABNORMAL HIGH (ref 70–99)
Glucose-Capillary: 78 mg/dL (ref 70–99)
Glucose-Capillary: 70 mg/dL (ref 70–99)
Glucose-Capillary: 87 mg/dL (ref 70–99)
Glucose-Capillary: 155 mg/dL — ABNORMAL HIGH (ref 70–99)

## 2020-11-17 LAB — SURGICAL PATHOLOGY

## 2020-11-17 MED ORDER — POTASSIUM CHLORIDE CRYS ER 10 MEQ PO TBCR
30.0000 meq | EXTENDED_RELEASE_TABLET | ORAL | Status: AC
  Administered 2020-11-17 (×2): 30 meq via ORAL
  Filled 2020-11-17 (×2): qty 1

## 2020-11-17 MED ORDER — SODIUM CHLORIDE 0.9 % IV SOLN
12.5000 mg | Freq: Four times a day (QID) | INTRAVENOUS | Status: DC | PRN
  Administered 2020-11-17 – 2020-11-18 (×2): 12.5 mg via INTRAVENOUS
  Filled 2020-11-17: qty 12.5
  Filled 2020-11-17: qty 0.5
  Filled 2020-11-17: qty 12.5

## 2020-11-17 NOTE — Progress Notes (Signed)
PROGRESS NOTE    MEG NIEMEIER  UVO:[id number] DOB: [birth year] DOA: 11/09/2020 PCP: Andria Frames, PA-C   Brief Narrative: Molly [name] is a 50 y.o.  female with medical history significant of diabetes mellitus type 1, bipolar disorder, hyperlipidemia, insomnia. Patient presented secondary to a right knee septic arthritis with evidence of associated osteomyelitis on MRI.  Infectious disease and orthopedics consulted.  Orthopedics consulted and s/p debridement of right knee with placement of articulating antibiotic spacer 10/21.  Intractable nausea and vomiting, [facility] consulted, s/p EGD which showed gastritis, improving.  Medically optimized for discharge pending SNF bed.  TOC on board.   Assessment & Plan:   Principal Problem:   Septic joint of right knee joint ([place]) Active Problems:   Type 1 diabetes mellitus (HCC)   Depression   Bipolar 1 disorder (HCC)   Nausea & vomiting   HLD (hyperlipidemia)   Unspecified severe protein-calorie malnutrition (HCC)   Protein-calorie malnutrition, severe   MSSA right native knee septic arthritis and acute osteomyelitis. -Follows with Dr. Percell Miller, orthopedics as outpatient - 3 months history of right knee pain, persistent and gradually worsening along with erythema and swelling.  Initially evaluated by orthopedics, work-up including DVT work-up was negative, received intra-articular steroid injection with temporary pain relief. - S/p joint aspiration in office week prior to admission by orthopedics-reportedly grew MSSA.  Subsequently hospitalized - S/p IR aspirated 1 mL of purulent and bloody synovial fluid on 10/18: MSSA. - MRI right knee shows right knee septic arthritis with extensive changes of acute osteomyelitis involving the distal femur, proximal tibia and patella along with findings suggestive of right distal thigh myositis. - CRP 5.4.  ESR 94. - Dr. Lyla Glassing, orthopedics: S/p debridement of right knee with placement of  articulating antibiotic spacer 10/21.  Tissue form right proximal tibia culture (10/21): MSSA and Staphylococcus capitis.  Right knee joint fluid culture (10/21): MSSA.  Blood cultures from 10/18: Negative, final report. - Orthopedics following and recommend 50% weightbearing with walker, knee immobilizer to be in place at all times, Hemovac can be removed prior to discharge and house VAC will need to be converted to Praveena portable unit at discharge.  Orthopedic Cleared for discharge. - As per ID follow-up, IV cefazolin through 12/24/2020 and has outpatient follow-up with Dr. Linus Salmons on 11/14  Nausea/intermittent vomiting -Reports 3 weeks symptoms.   - Lithium level low.  Total Tegretol level was elevated and evening dose of Tegretol was reduced. - Failed conservative measures.  [facility] GI was consulted - Underwent EGD 10/24 which showed gastritis, biopsied.   -Improved.  Patient declined clear liquids post EGD and tolerating soft diet.    Dizziness/vertigo: - Patient reports onset since 2 nights but not really very clear.  States that she feels that she is spinning around. - CT head: No acute findings.  - Initiated meclizine as needed for vertigo.  - Lithium level low -MRI brain without acute findings.  Patient is overall a poor historian, also significant overlay of anxiety, unclear regarding accuracy of her symptoms. - Has not complained of dizziness for 3 to 4 days now.   Diabetes mellitus, type 1 -Hemoglobin A1C of 8.5% from 8/22. Patient initially requested to manage insulin with her pump but now her pump is not working. Novolog basal rate of 1.2 units per hour via pump. - Hemoglobin A1c 6.5 on 10/18 suggesting good outpatient control. -Started Semglee 25 units daily and Novolog 5 units TID with meals and sensitive SSI. - Postop,  DM control had worsened likely related to a dose of steroids.  This is now improved to between 120-196   Essential hypertension - Amlodipine increased this  admission to 10 mg daily. - Metoprolol increased yesterday from 12.5-25 Mg twice daily.  Improved control. - Continue as needed IV hydralazine.  Acute post op blood loss anemia complicating anemia suspect chronic disease. Unsure if acute or chronic. No history of bleeding. Recent outside records suggesting stable hemoglobin from earlier this month -Anemia panel reviewed: Iron 30, ferritin 301 - EBL at surgery: Approximately 450 mL. - S/p 2 units PRBC transfusion this admission.  Hemoglobin was lowest at 6.4 g per DL.  This is improved and stable at 10.3.   Hyperlipidemia -Continue Crestor  Hypokalemia Replace and follow.   Insomnia -Continue Ambien qHS   Depression Bipolar 1 disorder -Continue Tegretol, lamotrigine, lithium (doses verified with prescriber per pharmacy) and Geodon. - Lithium level low.  Tegretol level, total: 13.8, free: 3.6.  Reduced p.m. dose of Tegretol marginally.  DVT prophylaxis: Lovenox Code Status:   Code Status: Full Code Family Communication: None at bedside. Disposition Plan: Medically optimized for discharge to SNF for short-term rehab pending SNF bed.  TOC team aware.   Consultants:  Orthopedic surgery Interventional radiology Infectious disease [facility]  Procedures:  RIGHT KNEE ASPIRATION (11/10/2020)  11/13/2020: 1) Open arthrotomy Right knee with excisional debridement of bone, cartilage, and synovium. 2. Placement of Prostalac articulating spacer right knee.3. Application of negative pressure incisional dressing. EGD 10/24.  Antimicrobials: As above.   Subjective: Patient reported no pain in right knee while laying in bed.  After having a bath and moved to reclining chair, 1-2/10 pain.  Intermittent nausea.  Reportedly has not vomited for several days.  Tolerating soft diet.  Aware that she is medically ready for DC.  States that Reglan works the best for her, refuses Zofran.  Advised her that Reglan is not usually the first option as an  antiemetic and has uncommon but significant side effects when this will not be continued long-term.  She verbalized understanding.  Objective: Vitals:   11/17/20 0730 11/17/20 0812 11/17/20 1210 11/17/20 1238  BP: (!) 176/72 (!) 156/70 (!) 162/83 133/74  Pulse: 88 79 (!) 124 74  Resp: _0 Temp: 98.2 F (36.8 C)   98.6 F (37 C)  TempSrc: Oral   Oral  SpO2: 100%     Weight:      Height:        Intake/Output Summary (Last 24 hours) at 11/17/2020 1306 Last data filed at 11/17/2020 2641 Gross per 24 hour  Intake 1054.95 ml  Output 1586 ml  Net -531.05 ml    Filed Weights   11/10/20 0800 11/13/20 1501 11/16/20 1213  Weight: 66.3 kg 66.3 kg 66.3 kg    Examination:  General exam: Young female, moderately built and nourished sitting up comfortably in bed without distress. Respiratory system: Clear to auscultation.  No increased work of breathing. Cardiovascular system: S1 and S2 heard, RRR.  No JVD, murmurs or pedal edema.   Gastrointestinal system: Abdomen nondistended, soft and nontender.  Normal bowel sounds heard. Central nervous system: Alert and oriented with no focal deficits.  Has a nasal twang to her voice.  No nystagmus appreciated. Musculoskeletal: Right knee pain knee immobilizer.  Drain visible with blood in it.  Right upper arm PICC line. Skin: No cyanosis. No rashes Psychiatry: Judgement and insight appear normal. Mood & affect appropriate.  Data Reviewed: I have personally reviewed following labs and imaging studies  CBC Lab Results  Component Value Date   WBC 4.1 11/17/2020   RBC 3.58 (L) 11/17/2020   HGB 10.3 (L) 11/17/2020   HCT 32.0 (L) 11/17/2020   MCV 89.4 11/17/2020   MCH 28.8 11/17/2020   PLT 239 11/17/2020   MCHC 32.2 11/17/2020   RDW 15.4 11/17/2020   LYMPHSABS 0.9 11/09/2020   MONOABS 0.7 11/09/2020   EOSABS 0.0 11/09/2020   BASOSABS 0.0 87/68/1157     Last metabolic panel Lab Results  Component Value Date   NA 134 (L)  11/17/2020   K 3.4 (L) 11/17/2020   CL 99 11/17/2020   CO2 28 11/17/2020   BUN 6 11/17/2020   CREATININE 0.36 (L) 11/17/2020   GLUCOSE 154 (H) 11/17/2020   GFRNONAA >60 11/17/2020   GFRAA >60 11/23/2015   CALCIUM 8.4 (L) 11/17/2020   PHOS 2.1 (L) 11/28/2009   PROT 5.6 (L) 11/15/2020   ALBUMIN 2.4 (L) 11/15/2020   BILITOT 0.3 11/15/2020   ALKPHOS 69 11/15/2020   AST 13 (L) 11/15/2020   ALT 8 11/15/2020   ANIONGAP 7 11/17/2020    CBG (last 3)  Recent Labs    11/16/20 1952 11/17/20 0801 11/17/20 1147  GLUCAP 120* 146* 135*     GFR: Estimated Creatinine Clearance: 80.9 mL/min (A) (by C-G formula based on SCr of 0.36 mg/dL (L)).  Coagulation Profile: No results for input(s): INR, PROTIME in the last 168 hours.  Recent Results (from the past 240 hour(s))  Resp Panel by RT-PCR (Flu A&B, Covid) Nasopharyngeal Swab     Status: None   Collection Time: 11/09/20  9:30 PM   Specimen: Nasopharyngeal Swab; Nasopharyngeal(NP) swabs in vial transport medium  Result Value Ref Range Status   SARS Coronavirus 2 by RT PCR NEGATIVE NEGATIVE Final    Comment: (NOTE) SARS-CoV-2 target nucleic acids are NOT DETECTED.  The SARS-CoV-2 RNA is generally detectable in upper respiratory specimens during the acute phase of infection. The lowest concentration of SARS-CoV-2 viral copies this assay can detect is 138 copies/mL. A negative result does not preclude SARS-Cov-2 infection and should not be used as the sole basis for treatment or other patient management decisions. A negative result may occur with  improper specimen collection/handling, submission of specimen other than nasopharyngeal swab, presence of viral mutation(s) within the areas targeted by this assay, and inadequate number of viral copies(<138 copies/mL). A negative result must be combined with clinical observations, patient history, and epidemiological information. The expected result is Negative.  Fact Sheet for Patients:   EntrepreneurPulse.com.au  Fact Sheet for Healthcare Providers:  IncredibleEmployment.be  This test is no t yet approved or cleared by the [place] FDA and  has been authorized for detection and/or diagnosis of SARS-CoV-2 by FDA under an Emergency Use Authorization (EUA). This EUA will remain  in effect (meaning this test can be used) for the duration of the COVID-19 declaration under Section 564(b)(1) of the Act, 21 U.S.C.section 360bbb-3(b)(1), unless the authorization is terminated  or revoked sooner.       Influenza A by PCR NEGATIVE NEGATIVE Final   Influenza B by PCR NEGATIVE NEGATIVE Final    Comment: (NOTE) The Xpert Xpress SARS-CoV-2/FLU/RSV plus assay is intended as an aid in the diagnosis of influenza from Nasopharyngeal swab specimens and should not be used as a sole basis for treatment. Nasal washings and aspirates are unacceptable for Xpert Xpress SARS-CoV-2/FLU/RSV testing.  Fact Sheet for  Patients: EntrepreneurPulse.com.au  Fact Sheet for Healthcare Providers: IncredibleEmployment.be  This test is not yet approved or cleared by the [place] FDA and has been authorized for detection and/or diagnosis of SARS-CoV-2 by FDA under an Emergency Use Authorization (EUA). This EUA will remain in effect (meaning this test can be used) for the duration of the COVID-19 declaration under Section 564(b)(1) of the Act, 21 U.S.C. section 360bbb-3(b)(1), unless the authorization is terminated or revoked.  Performed at [facility], [place] [address], [place], [place] [address]   Body fluid culture w Gram Stain     Status: None   Collection Time: 11/10/20  9:10 AM   Specimen: KNEE; Body Fluid  Result Value Ref Range Status   Specimen Description   Final    KNEE RT KNEE JOINT Performed at [facility] [address] [place], [place] [address]    Special  Requests   Final    NONE Performed at [facility], [place] [address], [place], [place] [address]    Gram Stain   Final    MODERATE WBC PRESENT, PREDOMINANTLY PMN MODERATE GRAM POSITIVE COCCI Performed at [facility], [place] [address] [place], [place] [address]    Culture ABUNDANT STAPHYLOCOCCUS AUREUS  Final   Report Status 11/12/2020 FINAL  Final   Organism ID, Bacteria STAPHYLOCOCCUS AUREUS  Final      Susceptibility   Staphylococcus aureus - MIC*    CIPROFLOXACIN >=8 RESISTANT Resistant     ERYTHROMYCIN <=0.25 SENSITIVE Sensitive     GENTAMICIN <=0.5 SENSITIVE Sensitive     OXACILLIN <=0.25 SENSITIVE Sensitive     TETRACYCLINE <=1 SENSITIVE Sensitive     VANCOMYCIN <=0.5 SENSITIVE Sensitive     TRIMETH/SULFA <=10 SENSITIVE Sensitive     CLINDAMYCIN <=0.25 SENSITIVE Sensitive     RIFAMPIN <=0.5 SENSITIVE Sensitive     Inducible Clindamycin NEGATIVE Sensitive     * ABUNDANT STAPHYLOCOCCUS AUREUS  Anaerobic culture w Gram Stain     Status: None   Collection Time: 11/10/20  9:12 AM   Specimen: KNEE  Result Value Ref Range Status   Specimen Description   Final    KNEE RT KNEE JOINT Performed at [facility], [place] [address] [place] [address], [place], [place] [address]    Special Requests   Final    NONE Performed at [facility], [place] [address] [place], [place] [address]    Gram Stain   Final    MODERATE WBC PRESENT, PREDOMINANTLY PMN MODERATE GRAM POSITIVE COCCI    Culture   Final    NO ANAEROBES ISOLATED Performed at [facility], [place] [address], [place], [place] [address]    Report Status 11/15/2020 FINAL  Final  Culture, blood (routine x 2)     Status: None   Collection Time: 11/10/20  2:22 PM   Specimen: BLOOD  Result Value Ref Range Status   Specimen Description   Final    BLOOD RIGHT ANTECUBITAL Performed at [facility] [address], [place], [place] [address]    Special Requests    Final    BOTTLES DRAWN AEROBIC AND ANAEROBIC Blood Culture adequate volume Performed at [facility] [address], [place], [place] [address]    Culture   Final    NO GROWTH 5 DAYS Performed at [facility], [place] [address], [place],  [address]    Report Status 11/15/2020 FINAL  Final  Culture, blood (routine x 2)     Status: None   Collection Time: 11/10/20  2:22 PM   Specimen: BLOOD  Result Value Ref Range Status   Specimen Description   Final    BLOOD RIGHT ANTECUBITAL Performed at [facility] [address], [place], [place] [address]    Special Requests   Final    BOTTLES DRAWN AEROBIC AND ANAEROBIC Blood Culture adequate volume Performed at [facility] [address], [place], [place] [address]    Culture   Final    NO GROWTH 5 DAYS Performed at [facility], [place] [address], [place], [place] [address]    Report Status 11/15/2020 FINAL  Final  Surgical pcr screen     Status: Abnormal   Collection Time: 11/13/20  6:25 AM  Result Value Ref Range Status   MRSA, PCR NEGATIVE NEGATIVE Final   Staphylococcus aureus POSITIVE (A) NEGATIVE Final    Comment: (NOTE) The Xpert SA Assay (FDA approved for NASAL specimens in patients 60 years of age and older), is one component of a comprehensive surveillance program. It is not intended to diagnose infection nor to guide or monitor treatment. Performed at [facility], [place] [address], [place], [place] [address]   Aerobic/Anaerobic Culture w Gram Stain (surgical/deep wound)     Status: None (Preliminary result)   Collection Time: 11/13/20  5:31 PM   Specimen: Wound  Result Value Ref Range Status   Specimen Description JOINT FLUID RIGHT KNEE  Final   Special Requests SWABS  Final   Gram Stain   Final    NO SQUAMOUS EPITHELIAL CELLS SEEN FEW WBC SEEN NO ORGANISMS SEEN Performed at [facility], [address],  [place], [place] [address]    Culture   Final    RARE STAPHYLOCOCCUS AUREUS NO ANAEROBES ISOLATED; CULTURE IN PROGRESS FOR 5 DAYS    Report Status PENDING  Incomplete   Organism ID, Bacteria STAPHYLOCOCCUS AUREUS  Final      Susceptibility   Staphylococcus aureus - MIC*    CIPROFLOXACIN >=8 RESISTANT Resistant     ERYTHROMYCIN <=0.25 SENSITIVE Sensitive     GENTAMICIN <=0.5 SENSITIVE Sensitive     OXACILLIN <=0.25 SENSITIVE Sensitive     TETRACYCLINE <=1 SENSITIVE Sensitive     VANCOMYCIN 1 SENSITIVE Sensitive     TRIMETH/SULFA <=10 SENSITIVE Sensitive     CLINDAMYCIN <=0.25 SENSITIVE Sensitive     RIFAMPIN <=0.5 SENSITIVE Sensitive     Inducible Clindamycin NEGATIVE Sensitive     * RARE STAPHYLOCOCCUS AUREUS  Aerobic/Anaerobic Culture w Gram Stain (surgical/deep wound)     Status: None (Preliminary result)   Collection Time: 11/13/20  5:48 PM   Specimen: Bone; Tissue  Result Value Ref Range Status   Specimen Description BONE RIGHT PROXIMAL TIBIA  Final   Special Requests NONE  Final   Gram Stain   Final    NO SQUAMOUS EPITHELIAL CELLS SEEN FEW WBC SEEN NO ORGANISMS SEEN    Culture   Final    RARE STAPHYLOCOCCUS AUREUS RARE STAPHYLOCOCCUS CAPITIS NO ANAEROBES ISOLATED; CULTURE IN PROGRESS FOR 5 DAYS    Report Status PENDING  Incomplete   Organism ID, Bacteria STAPHYLOCOCCUS AUREUS  Final   Organism ID, Bacteria STAPHYLOCOCCUS CAPITIS  Final      Susceptibility   Staphylococcus aureus - MIC*    CIPROFLOXACIN >=8 RESISTANT Resistant     ERYTHROMYCIN <=0.25 SENSITIVE Sensitive     GENTAMICIN <=  0.5 SENSITIVE Sensitive     OXACILLIN <=0.25 SENSITIVE Sensitive     TETRACYCLINE <=1 SENSITIVE Sensitive     VANCOMYCIN 1 SENSITIVE Sensitive     TRIMETH/SULFA <=10 SENSITIVE Sensitive     CLINDAMYCIN <=0.25 SENSITIVE Sensitive     RIFAMPIN <=0.5 SENSITIVE Sensitive     Inducible Clindamycin Value in next row Sensitive      NEGATIVEPerformed at [facility] [address] [place], [place] [address]    * RARE STAPHYLOCOCCUS AUREUS   Staphylococcus capitis - MIC*    CIPROFLOXACIN Value in next row Sensitive      NEGATIVEPerformed at [facility] [address] [place], [place] [address]    ERYTHROMYCIN Value in next row Sensitive      NEGATIVEPerformed at [facility] [address], [place], [place] [address]    GENTAMICIN Value in next row Sensitive      NEGATIVEPerformed at [facility] [address], [place], [place] [address]    OXACILLIN Value in next row Sensitive      NEGATIVEPerformed at [facility] [address] [place], [place] [address]    TETRACYCLINE Value in next row Sensitive      NEGATIVEPerformed at [facility] [address], [place], [place] [address]    VANCOMYCIN Value in next row Sensitive      NEGATIVEPerformed at [facility] [address], [place], [place] [address]    TRIMETH/SULFA Value in next row Sensitive      NEGATIVEPerformed at [facility] [address], [place], [place] [address]    CLINDAMYCIN Value in next row Sensitive      NEGATIVEPerformed at [facility] [address], [place], [place] [address]    RIFAMPIN Value in next row Sensitive      NEGATIVEPerformed at [facility] [address] [place], [place] [address]    Inducible Clindamycin Value in next row Sensitive      NEGATIVEPerformed at [facility] [address], [place], [place] [address]    * RARE STAPHYLOCOCCUS CAPITIS         Radiology Studies: No results found.      Scheduled Meds:  amLODipine  10 mg Oral Daily   aspirin  81 mg Oral BID   carbamazepine  1,000 mg Oral QHS   carbamazepine  200 mg Oral q morning   Chlorhexidine Gluconate Cloth  6 each Topical Q0600   docusate sodium  100 mg Oral BID   feeding supplement (GLUCERNA SHAKE)  237 mL Oral TID BM   insulin aspart  0-5 Units Subcutaneous QHS   insulin aspart  0-9 Units Subcutaneous TID WC   insulin  aspart  5 Units Subcutaneous TID WC   insulin glargine-yfgn  25 Units Subcutaneous Daily   lamoTRIgine  400 mg Oral Daily   lithium carbonate  300 mg Oral QPM   metoprolol tartrate  25 mg Oral BID   multivitamin with minerals  1 tablet Oral Daily   mupirocin ointment  1 application Nasal BID   pantoprazole  40 mg Oral Daily   rosuvastatin  40 mg Oral Daily   senna  1 tablet Oral BID   sodium chloride flush  10-40 mL Intracatheter Q12H   ziprasidone  120 mg Oral QHS   zolpidem  5 mg Oral QHS   Continuous Infusions:   ceFAZolin (ANCEF) IV Stopped (11/17/20  1000)     LOS: 8 days    Vernell Leep, MD, [place], [facility]. [facility]  To contact the attending provider between 7A-7P or the covering provider during after hours 7P-7A, please log into the web site [email or web address] and access using universal [place] password for that web site. If you do not have the password, please call the hospital operator.

## 2020-11-17 NOTE — Progress Notes (Signed)
Physical Therapy Treatment Patient Details Name: Molly [name] MRN: [id number] DOB: December 06, 1970 Today's Date: 11/17/2020   History of Present Illness Molly [name] is a 50 y.o.  female with medical history significant of diabetes mellitus type 1, bipolar disorder, hyperlipidemia, insomnia. Patient presented secondary to a right knee septic arthritis with evidence of associated osteomyelitis on MRI.  S / P PPROCEDURE: 1. Open arthrotomy Right knee with excisional debridement of bone, cartilage, and synovium.   2. Placement of Prostalac articulating spacer right knee.  3. Application of negative pressure incisional dressing on 11/13/20    PT Comments    The patient  Is much improved in bed mobility and able to take a few steps  with Rw. Patient will go to SNF.   Recommendations for follow up therapy are one component of a multi-disciplinary discharge planning process, led by the attending physician.  Recommendations may be updated based on patient status, additional functional criteria and insurance authorization.  Follow Up Recommendations  Skilled nursing-short term rehab (<3 hours/day)     Assistance Recommended at Discharge    Equipment Recommendations  Rolling walker (2 wheels)    Recommendations for Other Services       Precautions / Restrictions Precautions Precautions: Fall Required Braces or Orthoses: Knee Immobilizer - Right Knee Immobilizer - Right: On at all times Restrictions Weight Bearing Restrictions: Yes RLE Weight Bearing: Partial weight bearing RLE Partial Weight Bearing Percentage or Pounds: 50% Other Position/Activity Restrictions: knee immobilizer at all times     Mobility  Bed Mobility Overal bed mobility: Needs Assistance Bed Mobility: Supine to Sit     Supine to sit: Min guard     General bed mobility comments: patient  moved right leg  toward bed edge, therapist supported while lowering  foot to floor, Patient used bed rail to turn on bed.  Patient required support of right leg to place back onto bed.    Transfers Overall transfer level: Needs assistance Equipment used: Rolling walker (2 wheels) Transfers: Sit to/from [employer] Transfers Sit to Stand: +2 safety/equipment;Min assist Stand pivot transfers: Min assist;+2 safety/equipment         General transfer comment: cues for hand placement and PWB. Cues for RLE position when transitioning from stand to sit for pain mangement.    Ambulation/Gait Ambulation/Gait assistance: Min Chemical engineer (Feet): 10 Feet Assistive device: Rolling walker (2 wheels) Gait Pattern/deviations: Step-to pattern     General Gait Details: cues for sequence and 50%   Stairs             Wheelchair Mobility    Modified Rankin (Stroke Patients Only)       Balance Overall balance assessment: Needs assistance Sitting-balance support: Feet supported;Single extremity supported Sitting balance-Leahy Scale: Good     Standing balance support: During functional activity;Bilateral upper extremity supported Standing balance-Leahy Scale: Poor Standing balance comment: reliant on RW                            Cognition Arousal/Alertness: Awake/alert Behavior During Therapy: WFL for tasks assessed/performed;Flat affect Overall Cognitive Status: Within Functional Limits for tasks assessed                                 General Comments: much calmer and readily  willing to mobilize        Exercises      General  Comments        Pertinent Vitals/Pain Pain Assessment: 0-10 Pain Score: 8  Faces Pain Scale: Hurts little more Pain Location: right knee Pain Descriptors / Indicators: Aching;Grimacing Pain Intervention(s): Monitored during session;RN gave pain meds during session;Limited activity within patient's tolerance    Home Living                          Prior Function            PT Goals (current goals can now  be found in the care plan section) Progress towards PT goals: Progressing toward goals    Frequency    Min 3X/week      PT Plan Discharge plan needs to be updated;Frequency needs to be updated    Co-evaluation PT/OT/SLP Co-Evaluation/Treatment: Yes Reason for Co-Treatment: For patient/therapist safety PT goals addressed during session: Mobility/safety with mobility OT goals addressed during session: ADL's and self-care      AM-PAC PT "6 Clicks" Mobility   Outcome Measure  Help needed turning from your back to your side while in a flat bed without using bedrails?: A Little Help needed moving from lying on your back to sitting on the side of a flat bed without using bedrails?: A Little Help needed moving to and from a bed to a chair (including a wheelchair)?: A Lot Help needed standing up from a chair using your arms (e.g., wheelchair or bedside chair)?: A Lot Help needed to walk in hospital room?: A Lot Help needed climbing 3-5 steps with a railing? : Total 6 Click Score: 13    End of Session Equipment Utilized During Treatment: Gait belt;Right knee immobilizer Activity Tolerance: Patient tolerated treatment well Patient left: in chair;with call bell/phone within reach Nurse Communication: Mobility status PT Visit Diagnosis: Unsteadiness on feet (R26.81);Muscle weakness (generalized) (M62.81);Difficulty in walking, not elsewhere classified (R26.2);Pain Pain - Right/Left: Right Pain - part of body: Knee     Time: 0037-0488 PT Time Calculation (min) (ACUTE ONLY): 14 min  Charges:  $Gait Training: 8-22 mins                     Blanchard Kelch PT Acute Rehabilitation Services Pager [phone] Office [phone]    Rada Hay 11/17/2020, 1:06 PM

## 2020-11-17 NOTE — TOC Progression Note (Signed)
Transition of Care [facility]) - Progression Note    Patient Details  Name: Molly [name] MRN: [id number] Date of Birth: [birth year]  Transition of Care [facility]) CM/SW Contact  Ida Rogue, [place] Phone Number: 11/17/2020, 3:49 PM  Clinical Narrative:   Ms Samad selects bed offer from [facility]. I texted Wille Celeste to ask her to start insurance authorization. TOC will continue to follow during the course of hospitalization.     Expected Discharge Plan: Skilled Nursing Facility Barriers to Discharge: Other (must enter comment) (pending insurance authorization as requested by facility)  Expected Discharge Plan and Services Expected Discharge Plan: Skilled Nursing Facility   Discharge Planning Services: CM Consult Post Acute Care Choice: Skilled Nursing Facility Living arrangements for the past 2 months: Single Family Home                                       Social Determinants of Health (SDOH) Interventions    Readmission Risk Interventions No flowsheet data found.

## 2020-11-17 NOTE — Progress Notes (Signed)
Occupational Therapy Treatment Patient Details Name: Molly [name] MRN: [id number] DOB: 25-Jan-1970 Today's Date: 11/17/2020   History of present illness Molly [name] is a 50 y.o.  female with medical history significant of diabetes mellitus type 1, bipolar disorder, hyperlipidemia, insomnia. Patient presented secondary to a right knee septic arthritis with evidence of associated osteomyelitis on MRI.  S / P PPROCEDURE: 1. Open arthrotomy Right knee with excisional debridement of bone, cartilage, and synovium.   2. Placement of Prostalac articulating spacer right knee.  3. Application of negative pressure incisional dressing on 11/13/20   OT comments  Patient progressing slowly and showed improved tolerance to slowly transitioning from supine to sit to stand to chair, compared to previous session although still reporting dizziness with vitals once in chair: BP: 162/83, and HR: 124. Patient remains limited by possible learned helplessness from long term assistance from her mother, and decreased interest in learning compensatory strategies to increase independence with ADLs, RLE pain and PWB restrictions,  generalized weakness and decreased activity tolerance along with deficits noted below. Pt continues to demonstrate good rehab potential and would benefit from continued skilled OT to increase safety and independence with ADLs and functional transfers to allow pt to return home safely and reduce caregiver burden and fall risk.    Recommendations for follow up therapy are one component of a multi-disciplinary discharge planning process, led by the attending physician.  Recommendations may be updated based on patient status, additional functional criteria and insurance authorization.    Follow Up Recommendations  Skilled nursing-short term rehab (<3 hours/day)    Assistance Recommended at Discharge Frequent or constant Supervision/Assistance  Equipment Recommendations  BSC (2 wheeled RW with 5"  wheels)    Recommendations for Other Services      Precautions / Restrictions Precautions Precautions: Fall Required Braces or Orthoses: Knee Immobilizer - Right Knee Immobilizer - Right: On at all times Restrictions Weight Bearing Restrictions: Yes RLE Weight Bearing: Partial weight bearing RLE Partial Weight Bearing Percentage or Pounds: 50% Other Position/Activity Restrictions: knee immobilizer at all times       Mobility Bed Mobility Overal bed mobility: Needs Assistance Bed Mobility: Supine to Sit     Supine to sit: Min guard          Transfers Overall transfer level: Needs assistance Equipment used: Rolling walker (2 wheels) Transfers: Sit to/from [employer] Transfers Sit to Stand: +2 safety/equipment;Min assist Stand pivot transfers: Min assist;+2 safety/equipment         General transfer comment: cues for hand placement and PWB. Cues for RLE position when transitioning from stand to sit for pain mangement.     Balance Overall balance assessment: Needs assistance Sitting-balance support: Feet supported;Single extremity supported Sitting balance-Leahy Scale: Good     Standing balance support: During functional activity;Bilateral upper extremity supported Standing balance-Leahy Scale: Poor Standing balance comment: reliant on RW                           ADL either performed or assessed with clinical judgement   ADL Overall ADL's : Needs assistance/impaired     Grooming: Wash/dry face;Wash/dry hands;Oral care;Set up;Sitting;Brushing hair Grooming Details (indicate cue type and reason): in recliner Upper Body Bathing: Minimal assistance;Sitting Upper Body Bathing Details (indicate cue type and reason): Pt requested hair wash. Pt educated on compensatory strategy due to drains and wound vac for recliner level wash. Pt setup with soapy washclothes and pt able to use BUEs  to scrub hair/scalp with Min As for back of hair.     Upper Body  Dressing : Set up;Sitting Upper Body Dressing Details (indicate cue type and reason): To don posterior gown at EOB Lower Body Dressing: Total assistance Lower Body Dressing Details (indicate cue type and reason): Total As to don socks at bed level. Pt educated on adaptive equipment options to increase independence but pt declined to practice with equipment stating that her mother has helped her for 3 months and pt is satisfied with this routine. Toilet Transfer: Minimal assistance;Rolling walker (2 wheels);+2 for safety/equipment;+2 for physical assistance;Cueing for sequencing;Cueing for safety;Stand-pivot Toilet Transfer Details (indicate cue type and reason): Pt pivoted to recliner with 2 people for safety due to dizziness and PWB to RLE, use of RW to pivot to chair with Min As and verbal cues. Increased time/effort.   Toileting - Clothing Manipulation Details (indicate cue type and reason): Pt denied need to void. Using purewick in bed.     Functional mobility during ADLs: Minimal assistance;Rolling walker (2 wheels);+2 for physical assistance;+2 for safety/equipment       Vision       Perception     Praxis      Cognition Arousal/Alertness: Awake/alert Behavior During Therapy: WFL for tasks assessed/performed;Anxious;Flat affect Overall Cognitive Status: Within Functional Limits for tasks assessed                                            Exercises     Shoulder Instructions       General Comments      Pertinent Vitals/ Pain       Pain Assessment: 0-10 Pain Score: 8  Faces Pain Scale: Hurts little more Pain Location: right knee Pain Descriptors / Indicators: Aching;Grimacing Pain Intervention(s): Limited activity within patient's tolerance;Monitored during session;Repositioned;Premedicated before session  Home Living                                          Prior Functioning/Environment              Frequency  Min 2X/week         Progress Toward Goals  OT Goals(current goals can now be found in the care plan section)  Progress towards OT goals: Progressing toward goals  Acute Rehab OT Goals OT Goal Formulation: With patient Time For Goal Achievement: 11/30/20 Potential to Achieve Goals: Good  Plan Discharge plan remains appropriate    Co-evaluation                 AM-PAC OT "6 Clicks" Daily Activity     Outcome Measure   Help from another person eating meals?: None Help from another person taking care of personal grooming?: A Little Help from another person toileting, which includes using toliet, bedpan, or urinal?: A Lot Help from another person bathing (including washing, rinsing, drying)?: A Lot Help from another person to put on and taking off regular upper body clothing?: A Little Help from another person to put on and taking off regular lower body clothing?: A Lot 6 Click Score: 16    End of Session Equipment Utilized During Treatment: Gait belt;Rolling walker (2 wheels)  OT Visit Diagnosis: Unsteadiness on feet (R26.81);Muscle weakness (generalized) (M62.81);Pain Pain - Right/Left: Right Pain -  part of body: Knee   Activity Tolerance Patient tolerated treatment well;Other (comment);Patient limited by pain (Dizziness reported.)   Patient Left in chair;with call bell/phone within reach;with chair alarm set;with nursing/sitter in room   Nurse Communication Mobility status        Time: 3151-7616 OT Time Calculation (min): 31 min  Charges: OT General Charges $OT Visit: 1 Visit OT Treatments $Self Care/Home Management : 8-22 mins  Victorino Dike, OT Acute Rehab Services Office: [phone] 11/17/2020  Theodoro Clock 11/17/2020, 12:20 PM

## 2020-11-17 NOTE — Progress Notes (Signed)
PHARMACY CONSULT NOTE FOR:  OUTPATIENT  PARENTERAL ANTIBIOTIC THERAPY (OPAT)  Indication: MSSA knee septic arthritis Regimen: cefazolin 2g IV q8h End date: 12/24/20  IV antibiotic discharge orders are pended. To discharging provider:  please sign these orders via discharge navigator,  Select New Orders & click on the button choice - Manage This Unsigned Work.     Thank you for allowing pharmacy to be a part of this patient's care.  Molly [name] 11/17/2020, 10:26 AM

## 2020-11-18 ENCOUNTER — Encounter (HOSPITAL_COMMUNITY): Payer: Self-pay | Admitting: Orthopedic Surgery

## 2020-11-18 DIAGNOSIS — M00061 Staphylococcal arthritis, right knee: Secondary | ICD-10-CM | POA: Diagnosis not present

## 2020-11-18 DIAGNOSIS — F32A Depression, unspecified: Secondary | ICD-10-CM | POA: Diagnosis not present

## 2020-11-18 DIAGNOSIS — E1069 Type 1 diabetes mellitus with other specified complication: Secondary | ICD-10-CM | POA: Diagnosis not present

## 2020-11-18 DIAGNOSIS — F319 Bipolar disorder, unspecified: Secondary | ICD-10-CM | POA: Diagnosis not present

## 2020-11-18 LAB — BASIC METABOLIC PANEL
Anion gap: 7 (ref 5–15)
BUN: <5 mg/dL — ABNORMAL LOW (ref 6–20)
CO2: 27 mmol/L (ref 22–32)
Calcium: 8.7 mg/dL — ABNORMAL LOW (ref 8.9–10.3)
Chloride: 99 mmol/L (ref 98–111)
Creatinine, Ser: 0.41 mg/dL — ABNORMAL LOW (ref 0.44–1.00)
GFR, Estimated: >60 mL/min (ref >60)
Glucose, Bld: 131 mg/dL — ABNORMAL HIGH (ref 70–99)
Potassium: 4 mmol/L (ref 3.5–5.1)
Sodium: 133 mmol/L — ABNORMAL LOW (ref 135–145)

## 2020-11-18 LAB — AEROBIC/ANAEROBIC CULTURE W GRAM STAIN (SURGICAL/DEEP WOUND): Gram Stain: NONE SEEN

## 2020-11-18 LAB — GLUCOSE, CAPILLARY
Glucose-Capillary: 226 mg/dL — ABNORMAL HIGH (ref 70–99)
Glucose-Capillary: 287 mg/dL — ABNORMAL HIGH (ref 70–99)
Glucose-Capillary: 232 mg/dL — ABNORMAL HIGH (ref 70–99)
Glucose-Capillary: 80 mg/dL (ref 70–99)

## 2020-11-18 NOTE — Progress Notes (Signed)
PROGRESS NOTE    Molly [name]  HMC:[id number] DOB: [birth year] DOA: 11/09/2020 PCP: Virgilio Belling, PA-C   Brief Narrative: Molly [name] is a 50 y.o.  female with medical history significant of diabetes mellitus type 1, bipolar disorder, hyperlipidemia, insomnia. Patient presented secondary to a right knee septic arthritis with evidence of associated osteomyelitis on MRI. Orthopedic surgery on board with plan for eventual surgical management.   Assessment & Plan:   Principal Problem:   Septic joint of right knee joint (HCC) Active Problems:   Type 1 diabetes mellitus (HCC)   Depression   Bipolar 1 disorder (HCC)   Nausea & vomiting   HLD (hyperlipidemia)   Unspecified severe protein-calorie malnutrition (HCC)   Protein-calorie malnutrition, severe   Right septic knee joint Right distal femur/proximal tibia/patella acute osteomyelitis Patient with recent knee aspiration by her orthopedic surgeon which is growing GPCs. IR consulted for aspiration yielding 1 cc of purulent fluid with culture significant for MSSA. MRI of right knee confirms septic arthritis in addition to osteomyelitis. Patient underwent excisional debridement with placement of Prostalac articulating space and wound vac placement. Infectious disease recommending Cefazolin IV for 6 weeks with end date of 12/24/20.  -Orthopedic surgery recommendations: Partial weight bearing   Nausea/vomiting Present for the past several weeks. Unsure of etiology. Possibly related to above. Poorly controlled with Zofran as an outpatient -Continue Phenergan prn   Diabetes mellitus, type 1 Hemoglobin A1C of 8.5% from 8/22. Patient initially requested to manage insulin with her pump but now her pump is not working. Novolog basal rate of 1.2 units per hour via pump. Blood sugar not very well controlled but complicated by inconsistent PO intake. No evidence of developing DKA on BMP. -Continue Semglee 25 units daily and Novolog 5  units TID with meals -SSI qAC/HS  Anemia Perioperative blood loss Acute anemia No history of bleeding. Admitted with a hemoglobin of 10.8 with drop to 6.4 s/p surgery. Transfused 2 units of PRBC. Stable after transfusion.   Hyperlipidemia -Continue Crestor  Hypokalemia Potassium of 2.4 on admission. Resolved with potassium supplementation   Insomnia -Continue Ambien qHS   Depression Bipolar 1 disorder -Continue Tegretol, lamotrigine, lithium (doses verified with prescriber per pharmacy)  Hypertension -Continue amlodipine   DVT prophylaxis: Lovenox Code Status:   Code Status: Full Code Family Communication: None at bedside Disposition Plan: Discharge per orthopedic surgery recommendations/management   Consultants:  Orthopedic surgery Interventional radiology Infectious disease  Procedures:  RIGHT KNEE ASPIRATION (11/10/2020) ORTHOPEDIC SURGERY (11/13/2020) 1. OPEN ARTHROTOMY RIGHT KNEE WITH EXCISIONAL DEBRIDEMENT OF BONE, CARTILAGE, AND SYNOVIUM.  2. PLACEMENT OF PROSTALAC ARTICULATING SPACER RIGHT KNEE. 3. APPLICATION OF NEGATIVE PRESSURE INCISIONAL DRESSING.  Antimicrobials: Cefazolin   Subjective: Still with some nausea. No other concerns.  Objective: Vitals:   11/17/20 1238 11/17/20 1346 11/17/20 2030 11/18/20 0354  BP: 133/74 (!) 153/71 (!) 163/70 (!) 168/74  Pulse: 74 78 92 92  Resp: 19 14 20 20   Temp: 98.6 F (37 C) 98.5 F (36.9 C) 99.9 F (37.7 C) 97.9 F (36.6 C)  TempSrc: Oral Oral Oral Axillary  SpO2:  98% 100% 100%  Weight:   70.6 kg   Height:        Intake/Output Summary (Last 24 hours) at 11/18/2020 1244 Last data filed at 11/18/2020 1100 Gross per 24 hour  Intake 584.65 ml  Output 1375 ml  Net -790.35 ml   Filed Weights   11/13/20 1501 11/16/20 1213 11/17/20 2030  Weight: 66.3 kg 66.3 kg  70.6 kg    Examination:  General exam: Appears calm and comfortable Respiratory system: Clear to auscultation. Respiratory effort  normal. Cardiovascular system: S1 & S2 heard, RRR. No murmurs, rubs, gallops or clicks. Gastrointestinal system: Abdomen is nondistended, soft and nontender. No organomegaly or masses felt. Normal bowel sounds heard. Central nervous system: Alert and oriented. No focal neurological deficits. Musculoskeletal: No edema. No calf tenderness. Right knee in immobilizer Skin: No cyanosis. No rashes Psychiatry: Judgement and insight appear normal. Mood & affect appropriate.     Data Reviewed: I have personally reviewed following labs and imaging studies  CBC Lab Results  Component Value Date   WBC 4.1 11/17/2020   RBC 3.58 (L) 11/17/2020   HGB 10.3 (L) 11/17/2020   HCT 32.0 (L) 11/17/2020   MCV 89.4 11/17/2020   MCH 28.8 11/17/2020   PLT 239 11/17/2020   MCHC 32.2 11/17/2020   RDW 15.4 11/17/2020   LYMPHSABS 0.9 11/09/2020   MONOABS 0.7 11/09/2020   EOSABS 0.0 11/09/2020   BASOSABS 0.0 11/09/2020     Last metabolic panel Lab Results  Component Value Date   NA 133 (L) 11/18/2020   K 4.0 11/18/2020   CL 99 11/18/2020   CO2 27 11/18/2020   BUN <5 (L) 11/18/2020   CREATININE 0.41 (L) 11/18/2020   GLUCOSE 131 (H) 11/18/2020   GFRNONAA >60 11/18/2020   GFRAA >60 11/23/2015   CALCIUM 8.7 (L) 11/18/2020   PHOS 2.1 (L) 11/28/2009   PROT 5.6 (L) 11/15/2020   ALBUMIN 2.4 (L) 11/15/2020   BILITOT 0.3 11/15/2020   ALKPHOS 69 11/15/2020   AST 13 (L) 11/15/2020   ALT 8 11/15/2020   ANIONGAP 7 11/18/2020    CBG (last 3)  Recent Labs    11/17/20 2032 11/18/20 0801 11/18/20 1219  GLUCAP 155* 226* 287*     GFR: Estimated Creatinine Clearance: 80.9 mL/min (A) (by C-G formula based on SCr of 0.41 mg/dL (L)).   Recent Results (from the past 240 hour(s))  Resp Panel by RT-PCR (Flu A&B, Covid) Nasopharyngeal Swab     Status: None   Collection Time: 11/09/20  9:30 PM   Specimen: Nasopharyngeal Swab; Nasopharyngeal(NP) swabs in vial transport medium  Result Value Ref Range Status    SARS Coronavirus 2 by RT PCR NEGATIVE NEGATIVE Final    Comment: (NOTE) SARS-CoV-2 target nucleic acids are NOT DETECTED.  The SARS-CoV-2 RNA is generally detectable in upper respiratory specimens during the acute phase of infection. The lowest concentration of SARS-CoV-2 viral copies this assay can detect is 138 copies/mL. A negative result does not preclude SARS-Cov-2 infection and should not be used as the sole basis for treatment or other patient management decisions. A negative result may occur with  improper specimen collection/handling, submission of specimen other than nasopharyngeal swab, presence of viral mutation(s) within the areas targeted by this assay, and inadequate number of viral copies(<138 copies/mL). A negative result must be combined with clinical observations, patient history, and epidemiological information. The expected result is Negative.  Fact Sheet for Patients:  BloggerCourse.com  Fact Sheet for Healthcare Providers:  SeriousBroker.it  This test is no t yet approved or cleared by the [place] FDA and  has been authorized for detection and/or diagnosis of SARS-CoV-2 by FDA under an Emergency Use Authorization (EUA). This EUA will remain  in effect (meaning this test can be used) for the duration of the COVID-19 declaration under Section 564(b)(1) of the Act, 21 U.S.C.section 360bbb-3(b)(1), unless the authorization is terminated  or revoked sooner.       Influenza A by PCR NEGATIVE NEGATIVE Final   Influenza B by PCR NEGATIVE NEGATIVE Final    Comment: (NOTE) The Xpert Xpress SARS-CoV-2/FLU/RSV plus assay is intended as an aid in the diagnosis of influenza from Nasopharyngeal swab specimens and should not be used as a sole basis for treatment. Nasal washings and aspirates are unacceptable for Xpert Xpress SARS-CoV-2/FLU/RSV testing.  Fact Sheet for  Patients: BloggerCourse.com  Fact Sheet for Healthcare Providers: SeriousBroker.it  This test is not yet approved or cleared by the [place] FDA and has been authorized for detection and/or diagnosis of SARS-CoV-2 by FDA under an Emergency Use Authorization (EUA). This EUA will remain in effect (meaning this test can be used) for the duration of the COVID-19 declaration under Section 564(b)(1) of the Act, 21 U.S.C. section 360bbb-3(b)(1), unless the authorization is terminated or revoked.  Performed at [facility], [address], [place], [place] [address]   Body fluid culture w Gram Stain     Status: None   Collection Time: 11/10/20  9:10 AM   Specimen: KNEE; Body Fluid  Result Value Ref Range Status   Specimen Description   Final    KNEE RT KNEE JOINT Performed at [facility], [address] [place], [place] [address]    Special Requests   Final    NONE Performed at [facility], [address], [place], [place] [address]    Gram Stain   Final    MODERATE WBC PRESENT, PREDOMINANTLY PMN MODERATE GRAM POSITIVE COCCI Performed at [facility], [address], [place], [place] [address]    Culture ABUNDANT STAPHYLOCOCCUS AUREUS  Final   Report Status 11/12/2020 FINAL  Final   Organism ID, Bacteria STAPHYLOCOCCUS AUREUS  Final      Susceptibility   Staphylococcus aureus - MIC*    CIPROFLOXACIN >=8 RESISTANT Resistant     ERYTHROMYCIN <=0.25 SENSITIVE Sensitive     GENTAMICIN <=0.5 SENSITIVE Sensitive     OXACILLIN <=0.25 SENSITIVE Sensitive     TETRACYCLINE <=1 SENSITIVE Sensitive     VANCOMYCIN <=0.5 SENSITIVE Sensitive     TRIMETH/SULFA <=10 SENSITIVE Sensitive     CLINDAMYCIN <=0.25 SENSITIVE Sensitive     RIFAMPIN <=0.5 SENSITIVE Sensitive     Inducible Clindamycin NEGATIVE Sensitive     * ABUNDANT STAPHYLOCOCCUS AUREUS  Anaerobic culture w Gram  Stain     Status: None   Collection Time: 11/10/20  9:12 AM   Specimen: KNEE  Result Value Ref Range Status   Specimen Description   Final    KNEE RT KNEE JOINT Performed at [facility], [address] [place], [place] [address]    Special Requests   Final    NONE Performed at [facility], [address], [place], [place] [address]    Gram Stain   Final    MODERATE WBC PRESENT, PREDOMINANTLY PMN MODERATE GRAM POSITIVE COCCI    Culture   Final    NO ANAEROBES ISOLATED Performed at [facility], [address], [place], [place] [address]    Report Status 11/15/2020 FINAL  Final  Culture, blood (routine x 2)     Status: None   Collection Time: 11/10/20  2:22 PM   Specimen: BLOOD  Result Value Ref Range Status   Specimen Description   Final    BLOOD RIGHT ANTECUBITAL Performed at [facility], [address] [place], [place] [address]    Special Requests   Final    BOTTLES DRAWN AEROBIC AND ANAEROBIC Blood Culture adequate volume Performed at [facility], [address], [place], [place] [address]    Culture   Final    NO GROWTH 5 DAYS Performed at [facility], [address] [place], [place] [address]    Report Status 11/15/2020 FINAL  Final  Culture, blood (routine x 2)     Status: None   Collection Time: 11/10/20  2:22 PM   Specimen: BLOOD  Result Value Ref Range Status   Specimen Description   Final    BLOOD RIGHT ANTECUBITAL Performed at [facility], [address] [place], [place] [address]    Special Requests   Final    BOTTLES DRAWN AEROBIC AND ANAEROBIC Blood Culture adequate volume Performed at [facility], [address], [place], [place] [address]    Culture   Final    NO GROWTH 5 DAYS Performed at [facility], [address] [place], [place] [address]    Report Status 11/15/2020 FINAL  Final  Surgical pcr screen      Status: Abnormal   Collection Time: 11/13/20  6:25 AM  Result Value Ref Range Status   MRSA, PCR NEGATIVE NEGATIVE Final   Staphylococcus aureus POSITIVE (A) NEGATIVE Final    Comment: (NOTE) The Xpert SA Assay (FDA approved for NASAL specimens in patients 4 years of age and older), is one component of a comprehensive surveillance program. It is not intended to diagnose infection nor to guide or monitor treatment. Performed at [facility], [address] [place], [place] [address]   Aerobic/Anaerobic Culture w Gram Stain (surgical/deep wound)     Status: None   Collection Time: 11/13/20  5:31 PM   Specimen: Wound  Result Value Ref Range Status   Specimen Description JOINT FLUID RIGHT KNEE  Final   Special Requests SWABS  Final   Gram Stain   Final    NO SQUAMOUS EPITHELIAL CELLS SEEN FEW WBC SEEN NO ORGANISMS SEEN    Culture   Final    RARE STAPHYLOCOCCUS AUREUS NO ANAEROBES ISOLATED Performed at [facility], [address] [place], [place] [address]    Report Status 11/18/2020 FINAL  Final   Organism ID, Bacteria STAPHYLOCOCCUS AUREUS  Final      Susceptibility   Staphylococcus aureus - MIC*    CIPROFLOXACIN >=8 RESISTANT Resistant     ERYTHROMYCIN <=0.25 SENSITIVE Sensitive     GENTAMICIN <=0.5 SENSITIVE Sensitive     OXACILLIN <=0.25 SENSITIVE Sensitive     TETRACYCLINE <=1 SENSITIVE Sensitive     VANCOMYCIN 1 SENSITIVE Sensitive     TRIMETH/SULFA <=10 SENSITIVE Sensitive     CLINDAMYCIN <=0.25 SENSITIVE Sensitive     RIFAMPIN <=0.5 SENSITIVE Sensitive     Inducible Clindamycin NEGATIVE Sensitive     * RARE STAPHYLOCOCCUS AUREUS  Aerobic/Anaerobic Culture w Gram Stain (surgical/deep wound)     Status: None (Preliminary result)   Collection Time: 11/13/20  5:48 PM   Specimen: Bone; Tissue  Result Value Ref Range Status   Specimen Description BONE RIGHT PROXIMAL TIBIA  Final   Special Requests NONE  Final   Gram Stain   Final    NO  SQUAMOUS EPITHELIAL CELLS SEEN FEW WBC SEEN NO ORGANISMS SEEN    Culture   Final    RARE STAPHYLOCOCCUS AUREUS  RARE STAPHYLOCOCCUS CAPITIS NO ANAEROBES ISOLATED; CULTURE IN PROGRESS FOR 5 DAYS    Report Status PENDING  Incomplete   Organism ID, Bacteria STAPHYLOCOCCUS AUREUS  Final   Organism ID, Bacteria STAPHYLOCOCCUS CAPITIS  Final      Susceptibility   Staphylococcus aureus - MIC*    CIPROFLOXACIN >=8 RESISTANT Resistant     ERYTHROMYCIN <=0.25 SENSITIVE Sensitive     GENTAMICIN <=0.5 SENSITIVE Sensitive     OXACILLIN <=0.25 SENSITIVE Sensitive     TETRACYCLINE <=1 SENSITIVE Sensitive     VANCOMYCIN 1 SENSITIVE Sensitive     TRIMETH/SULFA <=10 SENSITIVE Sensitive     CLINDAMYCIN <=0.25 SENSITIVE Sensitive     RIFAMPIN <=0.5 SENSITIVE Sensitive     Inducible Clindamycin Value in next row Sensitive      NEGATIVEPerformed at [facility], [address] [place], [place] [address]    * RARE STAPHYLOCOCCUS AUREUS   Staphylococcus capitis - MIC*    CIPROFLOXACIN Value in next row Sensitive      NEGATIVEPerformed at [facility], [address] [place], [place] [address]    ERYTHROMYCIN Value in next row Sensitive      NEGATIVEPerformed at [facility], [address] [place], [place] [address]    GENTAMICIN Value in next row Sensitive      NEGATIVEPerformed at [facility], [address], [place], [place] [address]    OXACILLIN Value in next row Sensitive      NEGATIVEPerformed at [facility], [address], [place], [place] [address]    TETRACYCLINE Value in next row Sensitive      NEGATIVEPerformed at [facility], [address], [place], [place] [address]    VANCOMYCIN Value in next row Sensitive      NEGATIVEPerformed at [facility], [address] [place] [place], [place] [address]    TRIMETH/SULFA Value in next row Sensitive      NEGATIVEPerformed at [facility], [address] [place], [place] [address]    CLINDAMYCIN  Value in next row Sensitive      NEGATIVEPerformed at [facility], [address] [place], [place] [address]    RIFAMPIN Value in next row Sensitive      NEGATIVEPerformed at [facility], [address] [place], [place] [address]    Inducible Clindamycin Value in next row Sensitive      NEGATIVEPerformed at [facility], [address], [place], [place] [address]    * RARE STAPHYLOCOCCUS CAPITIS        Radiology Studies: No results found.   Scheduled Meds:  amLODipine  10 mg Oral Daily   aspirin  81 mg Oral BID   carbamazepine  1,000 mg Oral QHS   carbamazepine  200 mg Oral q morning   Chlorhexidine Gluconate Cloth  6 each Topical Q0600   docusate sodium  100 mg Oral BID   feeding supplement (GLUCERNA SHAKE)  237 mL Oral TID BM   insulin aspart  0-5 Units Subcutaneous QHS   insulin aspart  0-9 Units Subcutaneous TID WC   insulin aspart  5 Units Subcutaneous TID WC   insulin glargine-yfgn  25 Units Subcutaneous Daily   lamoTRIgine  400 mg Oral Daily   lithium carbonate  300 mg Oral QPM   metoprolol tartrate  25 mg Oral BID   multivitamin with minerals  1 tablet Oral Daily   pantoprazole  40  mg Oral Daily   rosuvastatin  40 mg Oral Daily   senna  1 tablet Oral BID   sodium chloride flush  10-40 mL Intracatheter Q12H   ziprasidone  120 mg Oral QHS   zolpidem  5 mg Oral QHS   Continuous Infusions:   ceFAZolin (ANCEF) IV 2 g (11/18/20 1027)   promethazine (PHENERGAN) injection (IM or IVPB) Stopped (11/17/20 1715)     LOS: 9 days     Jacquelin Hawking, MD [facility] 11/18/2020, 12:44 PM  If 7PM-7AM, please contact night-coverage [email or web address]

## 2020-11-18 NOTE — Progress Notes (Signed)
Occupational Therapy Treatment Patient Details Name: Molly [name] MRN: [id number] DOB: 08/10/1970 Today's Date: 11/18/2020   History of present illness Molly [name] is a 50 y.o.  female with medical history significant of diabetes mellitus type 1, bipolar disorder, hyperlipidemia, insomnia. Patient presented secondary to a right knee septic arthritis with evidence of associated osteomyelitis on MRI.  S / P PPROCEDURE: 1. Open arthrotomy Right knee with excisional debridement of bone, cartilage, and synovium.   2. Placement of Prostalac articulating spacer right knee.  3. Application of negative pressure incisional dressing on 11/13/20   OT comments  Patient was noted to have improved bed mobility with sup needed for line management for supine to sit on edge of bed with increased time. Patient required min A to transfer from edge of bed to 3 in 1 commode with education on PWB and importance of turning all the way to have BLE touch surface. Patient verbalized understanding. Patient maintains high falls risk at this time. Patient would continue to benefit from skilled OT services at this time while admitted and after d/c to address noted deficits in order to improve overall safety and independence in ADLs.     Recommendations for follow up therapy are one component of a multi-disciplinary discharge planning process, led by the attending physician.  Recommendations may be updated based on patient status, additional functional criteria and insurance authorization.    Follow Up Recommendations  Skilled nursing-short term rehab (<3 hours/day)    Assistance Recommended at Discharge Frequent or constant Supervision/Assistance  Equipment Recommendations  BSC    Recommendations for Other Services      Precautions / Restrictions Precautions Precautions: Fall Precaution Comments: wound vacs Required Braces or Orthoses: Knee Immobilizer - Right Knee Immobilizer - Right: On at all  times Restrictions Weight Bearing Restrictions: Yes RLE Weight Bearing: Partial weight bearing RLE Partial Weight Bearing Percentage or Pounds: 50% Other Position/Activity Restrictions: knee immobilizer at all times       Mobility Bed Mobility Overal bed mobility: Needs Assistance Bed Mobility: Supine to Sit     Supine to sit: Min guard     General bed mobility comments: patient was able to preform bed mobility for supine to sit with use of bed rails with supervision and increased time    Transfers Overall transfer level: Needs assistance Equipment used: Rolling walker (2 wheels) Transfers: Sit to/from [employer] Transfers Sit to Stand: +2 safety/equipment;Min assist Stand pivot transfers: Min assist;+2 safety/equipment         General transfer comment: cues for hand placement and PWB. Cues for RLE position when transitioning from stand to sit for pain mangement.     Balance                                           ADL either performed or assessed with clinical judgement   ADL Overall ADL's : Needs assistance/impaired                         Toilet Transfer: Minimal assistance;Rolling walker (2 wheels);+2 for safety/equipment;+2 for physical assistance;Cueing for sequencing;Cueing for safety;Stand-pivot Toilet Transfer Details (indicate cue type and reason): patient reported that she did not have any dizziness today at start of session. patient was educated on importance of backing up to chair with BLE prior to attempting to sit down. patient reported "  i have ben dealing with this for 3 months i know what i am doing". patient continued to require education for safety and to maintain precautions. Toileting- Clothing Manipulation and Hygiene: Minimal assistance;Sit to/from stand Toileting - Clothing Manipulation Details (indicate cue type and reason): patient was able to particiapte in hygiene tasks with set up sitting on 3 in 1  commode     Functional mobility during ADLs: Minimal assistance;Rolling walker (2 wheels);+2 for physical assistance;+2 for safety/equipment General ADL Comments: patient transfered from 3 in 1 commode to recliner in room with increased time. patient needed +2 for impulsiveness and for line management     Vision Baseline Vision/History: 1 Wears glasses Patient Visual Report: No change from baseline     Perception     Praxis      Cognition Arousal/Alertness: Awake/alert Behavior During Therapy: WFL for tasks assessed/performed;Flat affect Overall Cognitive Status: Within Functional Limits for tasks assessed                                            Exercises     Shoulder Instructions       General Comments      Pertinent Vitals/ Pain       Pain Assessment: Faces Faces Pain Scale: Hurts even more Pain Location: right knee Pain Descriptors / Indicators: Aching;Grimacing Pain Intervention(s): Limited activity within patient's tolerance;Monitored during session  Home Living                                          Prior Functioning/Environment              Frequency  Min 2X/week        Progress Toward Goals  OT Goals(current goals can now be found in the care plan section)  Progress towards OT goals: Progressing toward goals     Plan Discharge plan remains appropriate    Co-evaluation                 AM-PAC OT "6 Clicks" Daily Activity     Outcome Measure   Help from another person eating meals?: None Help from another person taking care of personal grooming?: A Little Help from another person toileting, which includes using toliet, bedpan, or urinal?: A Lot Help from another person bathing (including washing, rinsing, drying)?: A Lot Help from another person to put on and taking off regular upper body clothing?: A Little Help from another person to put on and taking off regular lower body clothing?: A  Lot 6 Click Score: 16    End of Session Equipment Utilized During Treatment: Gait belt;Rolling walker (2 wheels)  OT Visit Diagnosis: Unsteadiness on feet (R26.81);Muscle weakness (generalized) (M62.81);Pain   Activity Tolerance Patient tolerated treatment well;Patient limited by pain   Patient Left in chair;with call bell/phone within reach;with chair alarm set   Nurse Communication Mobility status        Time: 1435-1500 OT Time Calculation (min): 25 min  Charges: OT General Charges $OT Visit: 1 Visit OT Treatments $Self Care/Home Management : 23-37 mins  Sharyn Blitz OTR/L, MS Acute Rehabilitation Department Office# [phone] Pager# [phone]   [facility] Guest Bernhard Koskinen 11/18/2020, 3:58 PM

## 2020-11-18 NOTE — Progress Notes (Signed)
Nutrition Follow-up  DOCUMENTATION CODES:   Severe malnutrition in context of acute illness/injury  INTERVENTION:   -Glucerna Shake po TID, each supplement provides 220 kcal and 10 grams of protein  -Multivitamin with minerals daily  NUTRITION DIAGNOSIS:   Severe Malnutrition related to acute illness (right knee septic arthritis and acute osteomyelitis) as evidenced by percent weight loss, energy intake < or equal to 50% for > or equal to 1 month, severe muscle depletion, mild fat depletion.  Ongoing.  GOAL:   Patient will meet greater than or equal to 90% of their needs  Progressing.  MONITOR:   PO intake, Supplement acceptance, Labs, Weight trends, Skin, I & O's  ASSESSMENT:   50 y.o.  female with medical history significant of diabetes mellitus type 1, bipolar disorder, hyperlipidemia, insomnia. Patient presented secondary to a right knee septic arthritis with evidence of associated osteomyelitis on MRI. Orthopedic surgery on board with plan for eventual surgical management.  10/18: s/p right knee aspiration 10/21: s/p open arthrotomy Right knee with excisional debridement of bone, cartilage, and synovium.  10/24: s/p EGD -gastritis  Patient currently consuming 0-75% of meals. Accepted a Glucerna shake today, none for the last few days. Pt has continued to have N/V. Still with nausea today.   Admission weight: 146 lbs Current weight: 155 lbs  Medications: Colace, Multivitamin with minerals daily , Senokot  Labs reviewed:  CBGs: 70-287 Low Na  Diet Order:   Diet Order             DIET SOFT Room service appropriate? Yes; Fluid consistency: Thin  Diet effective now                   EDUCATION NEEDS:   No education needs have been identified at this time  Skin:  Skin Assessment: Skin Integrity Issues: Skin Integrity Issues:: Incisions Incisions: rt knee 10/21  Last BM:  10/18  Height:   Ht Readings from Last 1 Encounters:  11/16/20 5\' 7"  (1.702  m)    Weight:   Wt Readings from Last 1 Encounters:  11/17/20 70.6 kg    BMI:  Body mass index is 24.38 kg/m.  Estimated Nutritional Needs:   Kcal:  1950-2150  Protein:  95-105g  Fluid:  2.1L/day  11/19/20, MS, RD, LDN Inpatient Clinical Dietitian Contact information available via Amion

## 2020-11-18 NOTE — Progress Notes (Addendum)
Inpatient Diabetes Program Recommendations  AACE/ADA: New Consensus Statement on Inpatient Glycemic Control (2015)  Target Ranges:  Prepandial:   less than 140 mg/dL      Peak postprandial:   less than 180 mg/dL (1-2 hours)      Critically ill patients:  140 - 180 mg/dL   Lab Results  Component Value Date   GLUCAP 287 (H) 11/18/2020   HGBA1C 6.5 (H) 11/09/2020    Review of Glycemic Control Results for Molly [name], Molly [name] (MRN [id number]) as of 11/18/2020 13:20  Ref. Range 11/17/2020 17:22 11/17/2020 18:11 11/17/2020 20:32 11/18/2020 08:01 11/18/2020 12:19  Glucose-Capillary Latest Ref Range: 70 - 99 mg/dL 70 87 527 (H) 782 (H) 423 (H)   Diabetes history: DM1 Outpatient Diabetes medications: Insulin pump Current orders for Inpatient glycemic control: Semglee 25 units QD, Novolog 5 units tid meal coverage, Novolog 0-9 units TID with meals and 0-5 HS  Inpatient Diabetes Program Recommendations:   Patient had CBG to 70 post meal coverage yesterday but elevated post prandial CBGs today. Will continue to monitor trends.  Thank you, Billy Fischer. Armarion Greek, RN, MSN, CDE  Diabetes Coordinator Inpatient Glycemic Control Team Team Pager [phone] (8am-5pm) 11/18/2020 1:20 PM

## 2020-11-19 DIAGNOSIS — M869 Osteomyelitis, unspecified: Secondary | ICD-10-CM

## 2020-11-19 DIAGNOSIS — M00061 Staphylococcal arthritis, right knee: Secondary | ICD-10-CM | POA: Diagnosis not present

## 2020-11-19 DIAGNOSIS — F319 Bipolar disorder, unspecified: Secondary | ICD-10-CM | POA: Diagnosis not present

## 2020-11-19 LAB — AEROBIC/ANAEROBIC CULTURE W GRAM STAIN (SURGICAL/DEEP WOUND): Gram Stain: NONE SEEN

## 2020-11-19 LAB — GLUCOSE, CAPILLARY
Glucose-Capillary: 248 mg/dL — ABNORMAL HIGH (ref 70–99)
Glucose-Capillary: 366 mg/dL — ABNORMAL HIGH (ref 70–99)

## 2020-11-19 LAB — SARS CORONAVIRUS 2 (TAT 6-24 HRS): SARS Coronavirus 2: NEGATIVE

## 2020-11-19 MED ORDER — ASPIRIN 81 MG PO CHEW: 81.0000 mg | CHEWABLE_TABLET | Freq: Two times a day (BID) | ORAL | 1 refills | Status: AC

## 2020-11-19 MED ORDER — PROMETHAZINE HCL 12.5 MG PO TABS: 12.5000 mg | ORAL_TABLET | Freq: Four times a day (QID) | ORAL | Status: DC | PRN

## 2020-11-19 MED ORDER — CHLORHEXIDINE GLUCONATE CLOTH 2 % EX PADS
6.0000 | MEDICATED_PAD | Freq: Every day | CUTANEOUS | Status: DC
  Administered 2020-11-19: 6 via TOPICAL

## 2020-11-19 MED ORDER — DOCUSATE SODIUM 100 MG PO CAPS: 100.0000 mg | ORAL_CAPSULE | Freq: Two times a day (BID) | ORAL | Status: DC

## 2020-11-19 MED ORDER — ZOLPIDEM TARTRATE 10 MG PO TABS: 20.0000 mg | ORAL_TABLET | Freq: Every day | ORAL | 0 refills | Status: AC

## 2020-11-19 MED ORDER — GLUCERNA SHAKE PO LIQD: 237.0000 mL | Freq: Three times a day (TID) | ORAL | 0 refills | Status: AC

## 2020-11-19 MED ORDER — HEPARIN SOD (PORK) LOCK FLUSH 100 UNIT/ML IV SOLN
250.0000 [IU] | INTRAVENOUS | Status: AC | PRN
  Administered 2020-11-19: 250 [IU]

## 2020-11-19 MED ORDER — LYUMJEV 100 UNIT/ML IJ SOLN: INTRAMUSCULAR | Status: DC

## 2020-11-19 MED ORDER — OXYCODONE-ACETAMINOPHEN 5-325 MG PO TABS: 1.0000 | ORAL_TABLET | ORAL | 0 refills | Status: DC | PRN

## 2020-11-19 MED ORDER — POLYETHYLENE GLYCOL 3350 17 G PO PACK: 17.0000 g | PACK | Freq: Every day | ORAL | Status: DC | PRN

## 2020-11-19 MED ORDER — METOPROLOL TARTRATE 25 MG PO TABS: 25.0000 mg | ORAL_TABLET | Freq: Two times a day (BID) | ORAL | Status: DC

## 2020-11-19 MED ORDER — CARBAMAZEPINE 200 MG PO TABS: ORAL_TABLET | ORAL | Status: AC

## 2020-11-19 MED ORDER — CEFAZOLIN IV (FOR PTA / DISCHARGE USE ONLY): 2.0000 g | Freq: Three times a day (TID) | INTRAVENOUS | 0 refills | Status: AC

## 2020-11-19 NOTE — TOC Transition Note (Addendum)
Transition of Care [facility]) - CM/SW Discharge Note   Patient Details  Name: Molly [name] MRN: [id number] Date of Birth: December 18, 1970  [facility] CM/SW Contact:  Ida Rogue, LCSW Phone Number: 11/19/2020, 11:09 AM   Clinical Narrative:   Patient who is stable for d/c is transferring to [facility] today.  PTAR arranged.  Nursing, please call report to [phone], room 3213.  TOC sign off.    Final next level of care: Skilled Nursing Facility Barriers to Discharge: Barriers Resolved   Patient Goals and CMS Choice     Choice offered to / list presented to : Patient  Discharge Placement                       Discharge Plan and Services   Discharge Planning Services: CM Consult Post Acute Care Choice: Skilled Nursing Facility                               Social Determinants of Health (SDOH) Interventions     Readmission Risk Interventions No flowsheet data found.

## 2020-11-19 NOTE — Plan of Care (Addendum)
Received curbside consult on this pt with bipolar disorder on chronic tegretol recently admitted for septic knee; tegretol level drawn earlier in admission (when more medically ill) was elevated and home dose was reduced. Good chance this was a transient elevation reflecting decreased metabolic capacity in setting of sepsis, however generally it is lower risk to be slightly underdosed than overdosed on this med especially when medically ill. recommend following plan:   - continue lower dose of tegretol - recommend close outpt f/u with psychiatry; redraw level on this lower dose and decide dose going forward based on redraw.  I did not see or formally evaluate this pt. Spent > 5 min reviewing chart, discussing case with Dr. Caleb Popp, and typing up note.

## 2020-11-19 NOTE — Progress Notes (Signed)
Inpatient Diabetes Program Recommendations  AACE/ADA: New Consensus Statement on Inpatient Glycemic Control (2015)  Target Ranges:  Prepandial:   less than 140 mg/dL      Peak postprandial:   less than 180 mg/dL (1-2 hours)      Critically ill patients:  140 - 180 mg/dL   Lab Results  Component Value Date   GLUCAP 366 (H) 11/19/2020   HGBA1C 6.5 (H) 11/09/2020    Review of Glycemic Control Results for MAURI, TEMKIN (MRN [id number]) as of 11/19/2020 13:32  Ref. Range 11/18/2020 08:01 11/18/2020 12:19 11/18/2020 17:24 11/18/2020 20:51 11/19/2020 08:01 11/19/2020 12:02  Glucose-Capillary Latest Ref Range: 70 - 99 mg/dL 754 (H) 492 (H) 010 (H) 80 248 (H) 366 (H)   Diabetes history: DM1 Outpatient Diabetes medications: Insulin pump Current orders for Inpatient glycemic control: Semglee 25 units QD Novolog 5 units tid meal coverage   Inpatient Diabetes Program Recommendations:    Note pt was on Novolog 0-9 units 2 days ago and had 70 postprandial glucose readings. Trends elevated today.  -  Add Novolog "very sensitive" 0-6 units tid    Thanks, Christena Deem RN, MSN, BC-ADM Inpatient Diabetes Coordinator Team Pager [phone] (8a-5p)

## 2020-11-19 NOTE — Discharge Instructions (Signed)
Aris Lot,  You were in the hospital because of a right knee infection in addition to bone infection. You had surgery to clean the infection but will need to be on IV antibiotics for several weeks.

## 2020-11-19 NOTE — Progress Notes (Signed)
Pt discharging to facility. This RN called facility and left number for nurse to call back. AVS printed and placed in pt envelope for facility use. Single lumen PICC line intact to RUE. Restricted limb bracelet intact to RUE. Pt has all belongings. No further questions at this time.

## 2020-11-19 NOTE — Progress Notes (Signed)
Physical Therapy Treatment Patient Details Name: Molly [name] MRN: [id number] DOB: 04-27-1970 Today's Date: 11/19/2020   History of Present Illness Molly [name] is a 50 y.o.  female with medical history significant of diabetes mellitus type 1, bipolar disorder, hyperlipidemia, insomnia. Patient presented secondary to a right knee septic arthritis with evidence of associated osteomyelitis on MRI.  S / P PPROCEDURE: 1. Open arthrotomy Right knee with excisional debridement of bone, cartilage, and synovium.   2. Placement of Prostalac articulating spacer right knee.  3. Application of negative pressure incisional dressing on 11/13/20    PT Comments    Patient is improving and ambulated 20'. To SNF today.   Recommendations for follow up therapy are one component of a multi-disciplinary discharge planning process, led by the attending physician.  Recommendations may be updated based on patient status, additional functional criteria and insurance authorization.  Follow Up Recommendations  Skilled nursing-short term rehab (<3 hours/day)     Assistance Recommended at Discharge    Equipment Recommendations  None recommended by PT    Recommendations for Other Services       Precautions / Restrictions Precautions Precautions: Fall Precaution Comments: wound vacs Required Braces or Orthoses: Knee Immobilizer - Right Knee Immobilizer - Right: On at all times Restrictions RLE Weight Bearing: Partial weight bearing RLE Partial Weight Bearing Percentage or Pounds: 50     Mobility  Bed Mobility   Bed Mobility: Supine to Sit;Sit to Supine     Supine to sit: Min guard Sit to supine: Min guard   General bed mobility comments: patient was able to preform bed mobility for supine to sit with use of bed rails with supervision and increased time    Transfers                   General transfer comment: cues for hand placement and PWB. Cues for RLE position when transitioning  from stand to sit for pain mangement.    Ambulation/Gait Ambulation/Gait assistance: Min assist Gait Distance (Feet): 20 Feet Assistive device: Rolling walker (2 wheels) Gait Pattern/deviations: Step-to pattern     General Gait Details: cues for sequence and 50%   Stairs             Wheelchair Mobility    Modified Rankin (Stroke Patients Only)       Balance   Sitting-balance support: Feet supported;Single extremity supported       Standing balance support: During functional activity;Bilateral upper extremity supported Standing balance-Leahy Scale: Poor                              Cognition Arousal/Alertness: Awake/alert Behavior During Therapy: WFL for tasks assessed/performed;Flat affect Overall Cognitive Status: Within Functional Limits for tasks assessed                                          Exercises      General Comments        Pertinent Vitals/Pain Pain Score: 2  Pain Location: right knee Pain Descriptors / Indicators: Aching;Grimacing Pain Intervention(s): Monitored during session;Premedicated before session    Home Living                          Prior Function  PT Goals (current goals can now be found in the care plan section) Progress towards PT goals: Progressing toward goals    Frequency    Min 3X/week      PT Plan Current plan remains appropriate    Co-evaluation              AM-PAC PT "6 Clicks" Mobility   Outcome Measure  Help needed turning from your back to your side while in a flat bed without using bedrails?: None Help needed moving from lying on your back to sitting on the side of a flat bed without using bedrails?: None Help needed moving to and from a bed to a chair (including a wheelchair)?: A Little Help needed standing up from a chair using your arms (e.g., wheelchair or bedside chair)?: A Little Help needed to walk in hospital room?: A Lot Help  needed climbing 3-5 steps with a railing? : Total 6 Click Score: 17    End of Session Equipment Utilized During Treatment: Gait belt;Right knee immobilizer Activity Tolerance: Patient tolerated treatment well Patient left: with call bell/phone within reach Nurse Communication: Mobility status PT Visit Diagnosis: Unsteadiness on feet (R26.81);Muscle weakness (generalized) (M62.81);Difficulty in walking, not elsewhere classified (R26.2);Pain     Time: 4536-4680 PT Time Calculation (min) (ACUTE ONLY): 12 min  Charges:  $Gait Training: 8-22 mins                     Blanchard Kelch PT Acute Rehabilitation Services Pager [phone] Office [phone]    Rada Hay 11/19/2020, 4:24 PM

## 2020-11-19 NOTE — Discharge Summary (Addendum)
Physician Discharge Summary  Molly [name] RSW:[id number] DOB: 1970/04/05 DOA: 11/09/2020  PCP: Andria Frames, PA-C  Admit date: 11/09/2020 Discharge date: 11/19/2020  Admitted From: Home Disposition: SNF  Recommendations for Outpatient Follow-up:  Follow up with infectious disease 50% weight bearing of RLE Follow up with orthopedic surgery in 2 weeks Please obtain BMP/CBC in one week Please follow up on the following pending results: None  Discharge Condition: Stable CODE STATUS: Full code Diet recommendation: Carb modified   Brief/Interim Summary:  HPI: Molly [name] is a 50 y.o. female with medical history significant of diabetes mellitus type 1, bipolar disorder, hyperlipidemia, insomnia. Patient has had three months of persistent right knee pain and swelling. She sought evaluation/management at [facility] three months ago and had a workup for DVT which was negative. She decided to switch practices and saw Dr. Edmonia Lynch. To help with her symptoms, she laid in bed a lot and stayed off her leg. She used to take ibuprofen but stopped.   Hospital course:  Right septic knee joint Right distal femur/proximal tibia/patella acute osteomyelitis Patient with recent knee aspiration by her orthopedic surgeon which is growing GPCs. IR consulted for aspiration yielding 1 cc of purulent fluid with culture significant for MSSA. MRI of right knee confirms septic arthritis in addition to osteomyelitis. Patient underwent excisional debridement with placement of Prostalac articulating space and wound vac placement. Infectious disease recommending Cefazolin IV for 6 weeks with end date of 12/24/20. 50 % weight bearing of right lower extremity. Patient discharged with Prevena portable unit.   Nausea/vomiting Present for the past several weeks. Unsure of etiology. Possibly related to above. Poorly controlled with Zofran as an outpatient. Discharge on Phenergan prn for nausea.    Diabetes mellitus, type 1 Hemoglobin A1C of 8.5% from 8/22. Patient initially requested to manage insulin with her pump but now her pump is not working. Novolog basal rate of 1.2 units per hour via pump. Blood sugar not very well controlled but complicated by inconsistent PO intake. No evidence of developing DKA on BMP. Continue insulin pump on discharge; if does not want to continue insulin pump, patient was managed on insulin glargine 25 units daily and insulin aspart 5 units TID with meals.  Anemia Perioperative blood loss Acute anemia No history of bleeding. Admitted with a hemoglobin of 10.8 with drop to 6.4 s/p surgery. Transfused 2 units of PRBC. Stable after transfusion.   Hyperlipidemia Continue Crestor   Hypokalemia Potassium of 2.4 on admission. Resolved with potassium supplementation   Insomnia Continue Ambien qHS   Depression Bipolar 1 disorder Continue Tegretol, lamotrigine, lithium (doses verified with prescriber per pharmacy). Tegretol dose was decreased from 200 mg qAM and 1200 mg qPM to 200 mg qAM and 1000 mg qPM for an elevated total carbamazepine level of 13.8. Discussed with psychiatry who recommended to continue decreased dose and for close follow-up with outpatient psychiatrist as dose may need to be increased back to home dose shortly after discharge.   Hypertension Continue amlodipine. Started on metoprolol.   Discharge Diagnoses:  Principal Problem:   Septic joint of right knee joint ([place]) Active Problems:   Type 1 diabetes mellitus (HCC)   Depression   Bipolar 1 disorder (HCC)   Nausea & vomiting   HLD (hyperlipidemia)   Unspecified severe protein-calorie malnutrition ([place])   Protein-calorie malnutrition, severe   Osteomyelitis [facility])    Discharge Instructions  Discharge Instructions     [facility] pharmacist to adjust dose for  Vancomycin, Aminoglycosides and other anti-infective therapies as requested by physician.   Complete by: As  directed    [facility] to provide Cath Flo 36m   Complete by: As directed    Administer for PICC line occlusion and as ordered by physician for other access device issues.   Anaphylaxis Kit: Provided to treat any anaphylactic reaction to the medication being provided to the patient if First Dose or when requested by physician   Complete by: As directed    Epinephrine 158mml vial / amp: Administer 0.59m70m0.59ml34mubcutaneously once for moderate to severe anaphylaxis, nurse to call physician and pharmacy when reaction occurs and call 911 if needed for immediate care   Diphenhydramine 50mg62mIV vial: Administer 25-50mg 64mM PRN for first dose reaction, rash, itching, mild reaction, nurse to call physician and pharmacy when reaction occurs   Sodium Chloride 0.9% NS 500ml I85mdminister if needed for hypovolemic blood pressure drop or as ordered by physician after call to physician with anaphylactic reaction   Change dressing on IV access line weekly and PRN   Complete by: As directed    Flush IV access with Sodium Chloride 0.9% and Heparin 10 units/ml or 100 units/ml   Complete by: As directed    Home infusion instructions - [facility]   Complete by: As directed    Instructions: Flush IV access with Sodium Chloride 0.9% and Heparin 10units/ml or 100units/ml   Change dressing on IV access line: Weekly and PRN   Instructions Cath Flo 2mg: Ad76mister for PICC Line occlusion and as ordered by physician for other access device   [facility] pharmacist to adjust dose for: Vancomycin, Aminoglycosides and other anti-infective therapies as requested by physician   Method of administration may be changed at the discretion of home infusion pharmacist based upon assessment of the patient and/or caregiver's ability to self-administer the medication ordered   Complete by: As directed    No wound care   Complete by: As directed    Outpatient Parenteral Antibiotic Therapy  Information Antibiotic: Cefazolin (Ancef) IVPB; Indications for use: MSSA knee septic arthritis; End Date: 12/24/2020   Complete by: As directed    Antibiotic: Cefazolin (Ancef) IVPB   Indications for use: MSSA knee septic arthritis   End Date: 12/24/2020      Allergies as of 11/19/2020       Reactions   Varenicline Tartrate    REACTION: rash        Medication List     STOP taking these medications    alendronate 70 MG tablet Commonly known as: FOSAMAX   insulin regular 100 units/mL injection Commonly known as: NovoLIN R   rizatriptan 10 MG tablet Commonly known as: MAXALT   triamcinolone cream 0.1 % Commonly known as: KENALOG       TAKE these medications    Alcohol Prep 70 % Pads 8/day  250.01   amLODipine 5 MG tablet Commonly known as: NORVASC Take 5 mg by mouth daily.   aspirin 81 MG chewable tablet Chew 1 tablet (81 mg total) by mouth 2 (two) times daily.   CALCIUM 600 PO Take 1 tablet by mouth 2 (two) times daily.   carbamazepine 200 MG tablet Commonly known as: TEGRETOL Take 1 tablet (200 mg total) by mouth every morning AND 5 tablets (1,000 mg total) at bedtime. What changed: See the new instructions.   ceFAZolin  IVPB Commonly known as: ANCEF Inject 2 g into the vein every 8 (eight)  hours. Indication:  MSSA knee septic arthritis First Dose: No Last Day of Therapy:  12/24/20 Labs - Once weekly:  CBC/D and BMP, Labs - Every other week:  ESR and CRP Method of administration: IV Push Method of administration may be changed at the discretion of home infusion pharmacist based upon assessment of the patient and/or caregiver's ability to self-administer the medication ordered.   cetirizine-pseudoephedrine 5-120 MG tablet Commonly known as: ZYRTEC-D Take 1 tablet by mouth daily. What changed: Another medication with the same name was removed. Continue taking this medication, and follow the directions you see here.   cyclobenzaprine 10 MG  tablet Commonly known as: FLEXERIL Take 20 mg by mouth at bedtime.   docusate sodium 100 MG capsule Commonly known as: COLACE Take 1 capsule (100 mg total) by mouth 2 (two) times daily.   feeding supplement (GLUCERNA SHAKE) Liqd Take 237 mLs by mouth 3 (three) times daily between meals.   Flovent Diskus 50 MCG/ACT Aepb Generic drug: Fluticasone Propionate (Inhal) INHALE 1 PUFF BY MOUTH INTO THE LUNGS 2 TIMES DAILY What changed: See the new instructions.   freestyle lancets Use as instructed dx 250.01   glucose blood test strip Commonly known as: FREESTYLE LITE 8/day dx 250.01, and lancets   Klor-Con M20 20 MEQ tablet Generic drug: potassium chloride SA Take 20 mEq by mouth daily.   lamoTRIgine 200 MG tablet Commonly known as: LAMICTAL Take 200 mg by mouth 2 (two) times daily.   lithium carbonate 300 MG CR tablet Commonly known as: LITHOBID Take 300 mg by mouth daily.   Lyumjev 100 UNIT/ML Soln Generic drug: Insulin Lispro-aabc Insulin pump What changed:  how much to take how to take this when to take this   metoprolol tartrate 25 MG tablet Commonly known as: LOPRESSOR Take 1 tablet (25 mg total) by mouth 2 (two) times daily.   MINIMED INFUSION SET-MMT 398 Misc Inject 1 Device into the skin 3 days. Change every 3 days   PARADIGM RESERVOIR 3ML Misc Inject 1 Device into the skin 3 days. Change every 3 days   multivitamin tablet Take 1 tablet by mouth daily.   ondansetron 4 MG tablet Commonly known as: ZOFRAN TAKE 1 TABLET BY MOUTH EVERY 8 HOURS AS NEEDED FOR NAUSEA AND VOMITING What changed: See the new instructions.   oxyCODONE-acetaminophen 5-325 MG tablet Commonly known as: PERCOCET/ROXICET Take 1-2 tablets by mouth every 4 (four) hours as needed for severe pain or moderate pain.   polyethylene glycol 17 g packet Commonly known as: MIRALAX / GLYCOLAX Take 17 g by mouth daily as needed for mild constipation.   promethazine 12.5 MG tablet Commonly  known as: PHENERGAN Take 1 tablet (12.5 mg total) by mouth every 6 (six) hours as needed for nausea or vomiting.   rosuvastatin 40 MG tablet Commonly known as: CRESTOR TAKE 1 TABLET BY MOUTH DAILY GENERIC EQUIVALENT FOR CRESTOR What changed:  how much to take how to take this when to take this additional instructions   Sennosides 25 MG Tabs Take 1 tablet by mouth daily.   ziprasidone 60 MG capsule Commonly known as: GEODON Take 120 mg by mouth at bedtime. Take 2 by mouth at bedtime   zolpidem 10 MG tablet Commonly known as: AMBIEN Take 2 tablets (20 mg total) by mouth at bedtime.               Discharge Care Instructions  (From admission, onward)  Start     Ordered   11/19/20 0000  Change dressing on IV access line weekly and PRN  (Home infusion instructions - [facility] )        11/19/20 0953            Contact information for follow-up providers     Comer, Okey Regal, MD Follow up on 12/07/2020.   Specialty: Infectious Diseases Why: @ 11:15 AM. Can be a virtual visit if wanted, For hospital follow-up Contact information: [address] [address] [phone]         Rod Can, MD Follow up in 2 week(s).   Specialty: Orthopedic Surgery Why: For wound re-check, For hospital follow-up Contact information: [address] [place] [address] [phone]              Contact information for after-discharge care     Destination     [facility] Preferred SNF .   Service: Skilled Nursing Contact information: [place] [phone]                    Allergies  Allergen Reactions   Varenicline Tartrate     REACTION: rash    Consultations: Orthopedic surgery Infectious disease   Procedures/Studies: CT HEAD WO CONTRAST (5MM)  Result Date: 11/11/2020 CLINICAL DATA:  Dizziness, confusion,  slurred speech no acute abnormality. EXAM: CT HEAD WITHOUT CONTRAST TECHNIQUE: Contiguous axial images were obtained from the base of the skull through the vertex without intravenous contrast. COMPARISON:  11/23/2015 FINDINGS: Brain: Mild generalized atrophy. Negative for acute infarct, hemorrhage, mass. Vascular: Negative for hyperdense vessel Skull: Negative Sinuses/Orbits: Negative Other: None IMPRESSION: No acute abnormality. Mild cerebral atrophy with progression since 2017. Electronically Signed   By: Franchot Gallo M.D.   On: 11/11/2020 13:15   MR BRAIN WO CONTRAST  Result Date: 11/12/2020 CLINICAL DATA:  Dizziness EXAM: MRI HEAD WITHOUT CONTRAST TECHNIQUE: Multiplanar, multiecho pulse sequences of the brain and surrounding structures were obtained without intravenous contrast. COMPARISON:  CT head 11/11/2020 FINDINGS: Brain: No acute infarction, hemorrhage, hydrocephalus, extra-axial collection or mass lesion. Mild cortical atrophy for age. Normal white matter. Vascular: Normal arterial flow voids. Skull and upper cervical spine: No focal skeletal lesion. Sinuses/Orbits: Negative Other: None IMPRESSION: Mild cerebral atrophy.  No acute abnormality. Electronically Signed   By: Franchot Gallo M.D.   On: 11/12/2020 13:30   MR KNEE RIGHT W WO CONTRAST  Result Date: 11/10/2020 CLINICAL DATA:  Septic arthritis suspected, knee, no prior imaging EXAM: MRI OF THE RIGHT KNEE WITHOUT AND WITH CONTRAST TECHNIQUE: Multiplanar, multisequence MR imaging of the right knee was performed both before and after administration of intravenous contrast. CONTRAST:  68m GADAVIST GADOBUTROL 1 MMOL/ML IV SOLN COMPARISON:  None. FINDINGS: Technical Note: Despite efforts by the technologist and patient, motion artifact is present on today's exam and could not be eliminated. This reduces exam sensitivity and specificity. Bones/Joint/Cartilage Marked bone marrow edema throughout the distal femur with involvement of the medial  and lateral femoral condyles extending to the level of the distal metaphysis. There is also marked bone marrow edema and enhancement of the proximal tibia extending to the level of the proximal metaphysis. Confluent low T1 signal changes throughout the distal femur and proximal tibia are compatible with acute osteomyelitis. Erosive changes along the articular surfaces of both bones with complete joint space collapse. There is posterior subluxation of the tibia relative to the  distal femur. Fragmented bony densities along the anteromedial aspect of the proximal tibia. Marked bone marrow edema throughout the patella with erosive changes along the lateral patellar facet. Mild subchondral marrow edema within the fibular head without confluent low T1 signal changes. Small complex knee joint effusion with enhancing synovitis. Ligaments Nonvisualization of the ACL, likely torn. PCL is stretch with horizontal orientation. Collateral ligaments are poorly defined. Muscles and Tendons Intramuscular edema within the visualized distal thigh and proximal calf musculature. No intramuscular fluid collection is evident. Extensor mechanism remains intact. Soft tissues Diffuse periarticular soft tissue edema. No organized or rim enhancing extra-articular fluid collections. IMPRESSION: 1. Right knee septic arthritis with extensive changes of acute osteomyelitis involving the distal femur, proximal tibia, and patella. 2. Intramuscular edema within the visualized distal thigh and proximal calf musculature, suggesting myositis. No organized or rim enhancing extra-articular fluid collections. These results will be called to the ordering clinician or representative by the Radiologist Assistant, and communication documented in the PACS or [employer]. Electronically Signed   By: Davina Poke D.O.   On: 11/10/2020 08:24   DG Knee Right Port  Result Date: 11/13/2020 CLINICAL DATA:  Postop EXAM: PORTABLE RIGHT KNEE - 1-2 VIEW  COMPARISON:  11/11/2020 FINDINGS: Interval debridement of right knee with placement of right femoral prosthetic and proximal tibial spacer device. No fracture seen. Placement of suprapatellar drainage catheter. IMPRESSION: Interval postsurgical changes of the right knee Electronically Signed   By: Donavan Foil M.D.   On: 11/13/2020 21:05   DG Knee Right Port  Result Date: 11/11/2020 CLINICAL DATA:  50 year old female with septic arthritis, osteomyelitis of the distal femur, proximal tibia, and patella. Knee aspiration yesterday, continued pain. EXAM: PORTABLE RIGHT KNEE - 1-2 VIEW COMPARISON:  MRI 11/09/2020. FINDINGS: 3 portable views of the right knee. There is cortical osteolysis along the joint surfaces of the medial and lateral compartments, possibly also the undersurface of the patella. Subsequent partial collapse of the tibial plateau. Abnormal heterogeneous bone mineralization. Regional soft tissue swelling. No soft tissue gas. IMPRESSION: Septic right knee joint with widespread articular surface osteolysis in keeping with the widespread osteomyelitis demonstrated by MRI two days ago. Electronically Signed   By: Genevie Ann M.D.   On: 11/11/2020 09:35   IR DRAIN/INJ MAJOR JOINT/BURSA  Result Date: 11/10/2020 CLINICAL DATA:  HISTORY: Right knee pain x 3 months DIAGNOSIS: Right knee pain POST PROCEDURAL DIAGNOSIS: Same SPECIMEN: Yes ANESTHESIA: Local COMPLICATIONS: None immediate EXAM: ULTRASOUND-GUIDED RIGHT KNEE ARTHROCENTESIS PROCEDURE: Using ultrasound, the right knee was examined to determine location of fluid collection. Site was marked. The site was prepped in usual sterile fashion and approximately 3 mL 1% Lidocaine was used as local anesthesia. Thereafter, a spinal needle was guided into the suprapatellar joint space and 1 mL of purulent and bloody synovial fluid was acquired. The needle was removed and site bandaged. No complications.  Patient left department in stable condition. IMPRESSION:  Technically successful RIGHT knee joint aspiration with 1 mL specimen obtained and sent for appropriate labs. Procedure performed by Pasty Spillers, PA [facility] Radiology Electronically Signed   By: Michaelle Birks M.D.   On: 11/10/2020 13:32   [place] EKG SITE RITE  Result Date: 11/13/2020 If Site Rite image not attached, placement could not be confirmed due to current cardiac rhythm.    Subjective: No concerns today.  Discharge Exam: Vitals:   11/18/20 2052 11/19/20 0516  BP: (!) 156/75 (!) 151/73  Pulse: 87 85  Resp:  17 18  Temp: 99.2 F (37.3 C) 98.8 F (37.1 C)  SpO2: 100% 100%   Vitals:   11/18/20 0354 11/18/20 1246 11/18/20 2052 11/19/20 0516  BP: (!) 168/74 (!) 151/78 (!) 156/75 (!) 151/73  Pulse: 92 82 87 85  Resp: _0 Temp: 97.9 F (36.6 C) 99.7 F (37.6 C) 99.2 F (37.3 C) 98.8 F (37.1 C)  TempSrc: Axillary Oral    SpO2: 100% 97% 100% 100%  Weight:      Height:        General: Pt is alert, awake, not in acute distress Cardiovascular: RRR, S1/S2 +, no rubs, no gallops Respiratory: CTA bilaterally, no wheezing, no rhonchi Abdominal: Soft, NT, ND, bowel sounds + Extremities: no edema, no cyanosis    The results of significant diagnostics from this hospitalization (including imaging, microbiology, ancillary and laboratory) are listed below for reference.     Microbiology: Recent Results (from the past 240 hour(s))  Resp Panel by RT-PCR (Flu A&B, Covid) Nasopharyngeal Swab     Status: None   Collection Time: 11/09/20  9:30 PM   Specimen: Nasopharyngeal Swab; Nasopharyngeal(NP) swabs in vial transport medium  Result Value Ref Range Status   SARS Coronavirus 2 by RT PCR NEGATIVE NEGATIVE Final    Comment: (NOTE) SARS-CoV-2 target nucleic acids are NOT DETECTED.  The SARS-CoV-2 RNA is generally detectable in upper respiratory specimens during the acute phase of infection. The lowest concentration of  SARS-CoV-2 viral copies this assay can detect is 138 copies/mL. A negative result does not preclude SARS-Cov-2 infection and should not be used as the sole basis for treatment or other patient management decisions. A negative result may occur with  improper specimen collection/handling, submission of specimen other than nasopharyngeal swab, presence of viral mutation(s) within the areas targeted by this assay, and inadequate number of viral copies(<138 copies/mL). A negative result must be combined with clinical observations, patient history, and epidemiological information. The expected result is Negative.  Fact Sheet for Patients:  EntrepreneurPulse.com.au  Fact Sheet for Healthcare Providers:  IncredibleEmployment.be  This test is no t yet approved or cleared by the [place] FDA and  has been authorized for detection and/or diagnosis of SARS-CoV-2 by FDA under an Emergency Use Authorization (EUA). This EUA will remain  in effect (meaning this test can be used) for the duration of the COVID-19 declaration under Section 564(b)(1) of the Act, 21 U.S.C.section 360bbb-3(b)(1), unless the authorization is terminated  or revoked sooner.       Influenza A by PCR NEGATIVE NEGATIVE Final   Influenza B by PCR NEGATIVE NEGATIVE Final    Comment: (NOTE) The Xpert Xpress SARS-CoV-2/FLU/RSV plus assay is intended as an aid in the diagnosis of influenza from Nasopharyngeal swab specimens and should not be used as a sole basis for treatment. Nasal washings and aspirates are unacceptable for Xpert Xpress SARS-CoV-2/FLU/RSV testing.  Fact Sheet for Patients: EntrepreneurPulse.com.au  Fact Sheet for Healthcare Providers: IncredibleEmployment.be  This test is not yet approved or cleared by the [place] FDA and has been authorized for detection and/or diagnosis of SARS-CoV-2 by FDA under an Emergency Use  Authorization (EUA). This EUA will remain in effect (meaning this test can be used) for the duration of the COVID-19 declaration under Section 564(b)(1) of the Act, 21 U.S.C. section 360bbb-3(b)(1), unless the authorization is terminated or revoked.  Performed at [facility], [place] [address] [place], [place] [address]   Body fluid culture w Gram Stain  Status: None   Collection Time: 11/10/20  9:10 AM   Specimen: KNEE; Body Fluid  Result Value Ref Range Status   Specimen Description   Final    KNEE RT KNEE JOINT Performed at [place] [address], [place], [place] [address]    Special Requests   Final    NONE Performed at [facility], [place] [address], [place], [place] [address]    Gram Stain   Final    MODERATE WBC PRESENT, PREDOMINANTLY PMN MODERATE GRAM POSITIVE COCCI Performed at [facility], [place] [address] [place], [place] [address]    Culture ABUNDANT STAPHYLOCOCCUS AUREUS  Final   Report Status 11/12/2020 FINAL  Final   Organism ID, Bacteria STAPHYLOCOCCUS AUREUS  Final      Susceptibility   Staphylococcus aureus - MIC*    CIPROFLOXACIN >=8 RESISTANT Resistant     ERYTHROMYCIN <=0.25 SENSITIVE Sensitive     GENTAMICIN <=0.5 SENSITIVE Sensitive     OXACILLIN <=0.25 SENSITIVE Sensitive     TETRACYCLINE <=1 SENSITIVE Sensitive     VANCOMYCIN <=0.5 SENSITIVE Sensitive     TRIMETH/SULFA <=10 SENSITIVE Sensitive     CLINDAMYCIN <=0.25 SENSITIVE Sensitive     RIFAMPIN <=0.5 SENSITIVE Sensitive     Inducible Clindamycin NEGATIVE Sensitive     * ABUNDANT STAPHYLOCOCCUS AUREUS  Anaerobic culture w Gram Stain     Status: None   Collection Time: 11/10/20  9:12 AM   Specimen: KNEE  Result Value Ref Range Status   Specimen Description   Final    KNEE RT KNEE JOINT Performed at [facility], [place] [address], [place], [place] [address]    Special Requests   Final    NONE Performed  at [facility], [place] [address], [place], [place] [address]    Gram Stain   Final    MODERATE WBC PRESENT, PREDOMINANTLY PMN MODERATE GRAM POSITIVE COCCI    Culture   Final    NO ANAEROBES ISOLATED Performed at [facility], [place] [address], [place], [place] [address]    Report Status 11/15/2020 FINAL  Final  Culture, blood (routine x 2)     Status: None   Collection Time: 11/10/20  2:22 PM   Specimen: BLOOD  Result Value Ref Range Status   Specimen Description   Final    BLOOD RIGHT ANTECUBITAL Performed at [facility] [address] [place], [place] [address]    Special Requests   Final    BOTTLES DRAWN AEROBIC AND ANAEROBIC Blood Culture adequate volume Performed at [facility] [address], [place], [place] [address]    Culture   Final    NO GROWTH 5 DAYS Performed at [facility], [place] [address] [place], [place] [address]    Report Status 11/15/2020 FINAL  Final  Culture, blood (routine x 2)     Status: None   Collection Time: 11/10/20  2:22 PM   Specimen: BLOOD  Result Value Ref Range Status   Specimen Description   Final    BLOOD RIGHT ANTECUBITAL Performed at [facility] [address] [place], [place] [address]    Special Requests   Final    BOTTLES DRAWN AEROBIC AND ANAEROBIC Blood Culture adequate volume Performed at [facility] [address] [place],  [address]    Culture   Final    NO GROWTH 5 DAYS Performed at  [facility], [place] [address] [place], [place] [address]    Report Status 11/15/2020 FINAL  Final  Surgical pcr screen     Status: Abnormal   Collection Time: 11/13/20  6:25 AM  Result Value Ref Range Status   MRSA, PCR NEGATIVE NEGATIVE Final   Staphylococcus aureus POSITIVE (A) NEGATIVE Final    Comment: (NOTE) The Xpert SA Assay (FDA approved for NASAL specimens in patients 28 years of age and older), is one  component of a comprehensive surveillance program. It is not intended to diagnose infection nor to guide or monitor treatment. Performed at [facility], [place] [address], [place], [place] [address]   Aerobic/Anaerobic Culture w Gram Stain (surgical/deep wound)     Status: None   Collection Time: 11/13/20  5:31 PM   Specimen: Wound  Result Value Ref Range Status   Specimen Description JOINT FLUID RIGHT KNEE  Final   Special Requests SWABS  Final   Gram Stain   Final    NO SQUAMOUS EPITHELIAL CELLS SEEN FEW WBC SEEN NO ORGANISMS SEEN    Culture   Final    RARE STAPHYLOCOCCUS AUREUS NO ANAEROBES ISOLATED Performed at [facility], [place] [address], [place], [place] [address]    Report Status 11/18/2020 FINAL  Final   Organism ID, Bacteria STAPHYLOCOCCUS AUREUS  Final      Susceptibility   Staphylococcus aureus - MIC*    CIPROFLOXACIN >=8 RESISTANT Resistant     ERYTHROMYCIN <=0.25 SENSITIVE Sensitive     GENTAMICIN <=0.5 SENSITIVE Sensitive     OXACILLIN <=0.25 SENSITIVE Sensitive     TETRACYCLINE <=1 SENSITIVE Sensitive     VANCOMYCIN 1 SENSITIVE Sensitive     TRIMETH/SULFA <=10 SENSITIVE Sensitive     CLINDAMYCIN <=0.25 SENSITIVE Sensitive     RIFAMPIN <=0.5 SENSITIVE Sensitive     Inducible Clindamycin NEGATIVE Sensitive     * RARE STAPHYLOCOCCUS AUREUS  Aerobic/Anaerobic Culture w Gram Stain (surgical/deep wound)     Status: None (Preliminary result)   Collection Time: 11/13/20  5:48 PM   Specimen: Bone; Tissue  Result Value Ref Range Status   Specimen Description BONE RIGHT PROXIMAL TIBIA  Final   Special Requests NONE  Final   Gram Stain   Final    NO SQUAMOUS EPITHELIAL CELLS SEEN FEW WBC SEEN NO ORGANISMS SEEN    Culture   Final    RARE STAPHYLOCOCCUS AUREUS RARE STAPHYLOCOCCUS CAPITIS NO ANAEROBES ISOLATED; CULTURE IN PROGRESS FOR 5 DAYS    Report Status PENDING  Incomplete   Organism ID, Bacteria STAPHYLOCOCCUS AUREUS  Final    Organism ID, Bacteria STAPHYLOCOCCUS CAPITIS  Final      Susceptibility   Staphylococcus aureus - MIC*    CIPROFLOXACIN >=8 RESISTANT Resistant     ERYTHROMYCIN <=0.25 SENSITIVE Sensitive     GENTAMICIN <=0.5 SENSITIVE Sensitive     OXACILLIN <=0.25 SENSITIVE Sensitive     TETRACYCLINE <=1 SENSITIVE Sensitive     VANCOMYCIN 1 SENSITIVE Sensitive     TRIMETH/SULFA <=10 SENSITIVE Sensitive     CLINDAMYCIN <=0.25 SENSITIVE Sensitive     RIFAMPIN <=0.5 SENSITIVE Sensitive     Inducible Clindamycin Value in next row Sensitive      NEGATIVEPerformed at [facility], [address], [place],  [address]    * RARE STAPHYLOCOCCUS AUREUS   Staphylococcus capitis - MIC*    CIPROFLOXACIN Value in next row Sensitive      NEGATIVEPerformed at [facility], [place] [address] [place], [place] [address]    ERYTHROMYCIN Value in next row Sensitive      NEGATIVEPerformed at [facility] [address], [place], [place] [address]    GENTAMICIN Value in next row Sensitive      NEGATIVEPerformed at [facility] [address], [place], [place] [address]    OXACILLIN Value in next row Sensitive      NEGATIVEPerformed at [place] [facility] [address] [place], [place] [address]    TETRACYCLINE Value in next row Sensitive      NEGATIVEPerformed at [facility] [address], [place], [place] [address]    VANCOMYCIN Value in next row Sensitive      NEGATIVEPerformed at [facility] [address] [place], [place] [address]    TRIMETH/SULFA Value in next row Sensitive      NEGATIVEPerformed at [facility] [address], [place], [place] [address]    CLINDAMYCIN Value in next row Sensitive      NEGATIVEPerformed at [facility] [address] [place], [place] [address]    RIFAMPIN Value in next row Sensitive      NEGATIVEPerformed at [facility] [address], [place], [place] [address]    Inducible Clindamycin Value in next row  Sensitive      NEGATIVEPerformed at [facility] [address] [place], [place] [address]    * RARE STAPHYLOCOCCUS CAPITIS  SARS CORONAVIRUS 2 (TAT 6-24 HRS) Nasopharyngeal Nasopharyngeal Swab     Status: None   Collection Time: 11/18/20  4:25 PM   Specimen: Nasopharyngeal Swab  Result Value Ref Range Status   SARS Coronavirus 2 NEGATIVE NEGATIVE Final    Comment: (NOTE) SARS-CoV-2 target nucleic acids are NOT DETECTED.  The SARS-CoV-2 RNA is generally detectable in upper and lower respiratory specimens during the acute phase of infection. Negative results do not preclude SARS-CoV-2 infection, do not rule out co-infections with other pathogens, and should not be used as the sole basis for treatment or other patient management decisions. Negative results must be combined with clinical observations, patient history, and epidemiological information. The expected result is Negative.  Fact Sheet for Patients: SugarRoll.be  Fact Sheet for Healthcare Providers: https://www.woods-mathews.com/  This test is not yet approved or cleared by the [place] FDA and  has been authorized for detection and/or diagnosis of SARS-CoV-2 by FDA under an Emergency Use Authorization (EUA). This EUA will remain  in effect (meaning this test can be used) for the duration of the COVID-19 declaration under Se ction 564(b)(1) of the Act, 21 U.S.C. section 360bbb-3(b)(1), unless the authorization is terminated or revoked sooner.  Performed at [facility], [place] [address] [place],  [address]      Labs: BNP (last 3 results) No results for input(s): BNP in the last 8760 hours. Basic Metabolic Panel: Recent Labs  Lab 11/14/20 0255 11/15/20 0324 11/16/20 0304 11/17/20 0841 11/18/20 0339  NA 133* 133* 136 134* 133*  K 4.1 3.6 3.2* 3.4* 4.0  CL 103 103 104 99 99  CO2 _0 GLUCOSE 216* 215* 163* 154* 131*  BUN 8 <5* 5* 6  <5*  CREATININE 0.48 0.42* 0.30* 0.36* 0.41*  CALCIUM 8.1* 8.2* 8.4* 8.4* 8.7*  MG  --   --  2.0  --   --    Liver Function Tests: Recent Labs  Lab 11/14/20 0255 11/15/20 0324  AST 13* 13*  ALT 9 8  ALKPHOS 67 69  BILITOT 0.4 0.3  PROT 5.7* 5.6*  ALBUMIN 2.5* 2.4*   No results for input(s): LIPASE, AMYLASE in the last 168 hours. No results for input(s): AMMONIA in the last 168 hours. CBC: Recent Labs  Lab 11/14/20 0255 11/14/20 1905 11/15/20 0324 11/15/20 1844 11/16/20 0304 11/16/20 1111 11/17/20 0841  WBC 11.4* 8.2 6.3  --  5.5  --  4.1  HGB 7.8* 7.2* 6.4* 10.3* 9.6* 9.9* 10.3*  HCT 25.6* 23.4* 20.9* 31.5* 29.4* 30.6* 32.0*  MCV 88.9 87.3 88.9  --  87.5  --  89.4  PLT 263 262 184  --  193  --  239   Cardiac Enzymes: No results for input(s): CKTOTAL, CKMB, CKMBINDEX, TROPONINI in the last 168 hours. BNP: Invalid input(s): POCBNP CBG: Recent Labs  Lab 11/18/20 0801 11/18/20 1219 11/18/20 1724 11/18/20 2051 11/19/20 0801  GLUCAP 226* 287* 232* 80 248*   D-Dimer No results for input(s): DDIMER in the last 72 hours. Hgb A1c No results for input(s): HGBA1C in the last 72 hours. Lipid Profile No results for input(s): CHOL, HDL, LDLCALC, TRIG, CHOLHDL, LDLDIRECT in the last 72 hours. Thyroid function studies No results for input(s): TSH, T4TOTAL, T3FREE, THYROIDAB in the last 72 hours.  Invalid input(s): FREET3 Anemia work up No results for input(s): VITAMINB12, FOLATE, FERRITIN, TIBC, IRON, RETICCTPCT in the last 72 hours. Urinalysis    Component Value Date/Time   COLORURINE YELLOW 02/24/2016 0826   APPEARANCEUR [place] (A) 02/24/2016 0826   LABSPEC <=1.005 (A) 02/24/2016 0826   PHURINE 6.0 02/24/2016 0826   GLUCOSEU >=1000 (A) 02/24/2016 0826   HGBUR NEGATIVE 02/24/2016 0826   BILIRUBINUR NEGATIVE 02/24/2016 0826   KETONESUR NEGATIVE 02/24/2016 0826   PROTEINUR NEGATIVE 04/06/2014 2323   UROBILINOGEN 0.2 02/24/2016 0826   NITRITE POSITIVE (A)  02/24/2016 0826   LEUKOCYTESUR NEGATIVE 02/24/2016 0826   Sepsis Labs Invalid input(s): PROCALCITONIN,  WBC,  LACTICIDVEN Microbiology Recent Results (from the past 240 hour(s))  Resp Panel by RT-PCR (Flu A&B, Covid) Nasopharyngeal Swab     Status: None   Collection Time: 11/09/20  9:30 PM   Specimen: Nasopharyngeal Swab; Nasopharyngeal(NP) swabs in vial transport medium  Result Value Ref Range Status   SARS Coronavirus 2 by RT PCR NEGATIVE NEGATIVE Final    Comment: (NOTE) SARS-CoV-2 target nucleic acids are NOT DETECTED.  The SARS-CoV-2 RNA is generally detectable in upper respiratory specimens during the acute phase of infection. The lowest concentration of SARS-CoV-2 viral copies this assay can detect is 138 copies/mL. A negative result does not preclude SARS-Cov-2 infection and should not be used as the sole basis for treatment or other patient management decisions. A negative result may occur with  improper specimen collection/handling, submission of specimen other than nasopharyngeal swab, presence of viral mutation(s) within the areas targeted by this assay, and inadequate number of viral copies(<138 copies/mL). A negative result must be combined with clinical observations, patient history, and epidemiological information. The expected result is Negative.  Fact Sheet for Patients:  EntrepreneurPulse.com.au  Fact Sheet for Healthcare Providers:  IncredibleEmployment.be  This test is no t yet approved or cleared by the [place] FDA and  has been authorized for detection and/or diagnosis of SARS-CoV-2 by FDA under an Emergency Use Authorization (EUA). This EUA will remain  in effect (meaning this test can be used) for the duration of the COVID-19 declaration under Section 564(b)(1)  of the Act, 21 U.S.C.section 360bbb-3(b)(1), unless the authorization is terminated  or revoked sooner.       Influenza A by PCR NEGATIVE NEGATIVE  Final   Influenza B by PCR NEGATIVE NEGATIVE Final    Comment: (NOTE) The Xpert Xpress SARS-CoV-2/FLU/RSV plus assay is intended as an aid in the diagnosis of influenza from Nasopharyngeal swab specimens and should not be used as a sole basis for treatment. Nasal washings and aspirates are unacceptable for Xpert Xpress SARS-CoV-2/FLU/RSV testing.  Fact Sheet for Patients: EntrepreneurPulse.com.au  Fact Sheet for Healthcare Providers: IncredibleEmployment.be  This test is not yet approved or cleared by the [place] FDA and has been authorized for detection and/or diagnosis of SARS-CoV-2 by FDA under an Emergency Use Authorization (EUA). This EUA will remain in effect (meaning this test can be used) for the duration of the COVID-19 declaration under Section 564(b)(1) of the Act, 21 U.S.C. section 360bbb-3(b)(1), unless the authorization is terminated or revoked.  Performed at [facility], [place] [address], [place], [place] [address]   Body fluid culture w Gram Stain     Status: None   Collection Time: 11/10/20  9:10 AM   Specimen: KNEE; Body Fluid  Result Value Ref Range Status   Specimen Description   Final    KNEE RT KNEE JOINT Performed at [facility] [address] [place], [place] [address]    Special Requests   Final    NONE Performed at [facility], [place] [address], [place], [place] [address]    Gram Stain   Final    MODERATE WBC PRESENT, PREDOMINANTLY PMN MODERATE GRAM POSITIVE COCCI Performed at [facility], [place] [address] [place], [place] [address]    Culture ABUNDANT STAPHYLOCOCCUS AUREUS  Final   Report Status 11/12/2020 FINAL  Final   Organism ID, Bacteria STAPHYLOCOCCUS AUREUS  Final      Susceptibility   Staphylococcus aureus - MIC*    CIPROFLOXACIN >=8 RESISTANT Resistant     ERYTHROMYCIN <=0.25 SENSITIVE Sensitive     GENTAMICIN <=0.5 SENSITIVE  Sensitive     OXACILLIN <=0.25 SENSITIVE Sensitive     TETRACYCLINE <=1 SENSITIVE Sensitive     VANCOMYCIN <=0.5 SENSITIVE Sensitive     TRIMETH/SULFA <=10 SENSITIVE Sensitive     CLINDAMYCIN <=0.25 SENSITIVE Sensitive     RIFAMPIN <=0.5 SENSITIVE Sensitive     Inducible Clindamycin NEGATIVE Sensitive     * ABUNDANT STAPHYLOCOCCUS AUREUS  Anaerobic culture w Gram Stain     Status: None   Collection Time: 11/10/20  9:12 AM   Specimen: KNEE  Result Value Ref Range Status   Specimen Description   Final    KNEE RT KNEE JOINT Performed at [facility], [place] [address], [place], [place] [address]    Special Requests   Final    NONE Performed at [facility], [place] [address] [place], [place] [address]    Gram Stain   Final    MODERATE WBC PRESENT, PREDOMINANTLY PMN MODERATE GRAM POSITIVE COCCI    Culture   Final    NO ANAEROBES ISOLATED Performed at [facility], [place] [address], [place], [place] [address]    Report Status 11/15/2020 FINAL  Final  Culture, blood (routine x 2)     Status: None   Collection Time: 11/10/20  2:22 PM   Specimen: BLOOD  Result Value Ref Range Status   Specimen Description   Final  BLOOD RIGHT ANTECUBITAL Performed at [facility] [address] [place], [place] [address]    Special Requests   Final    BOTTLES DRAWN AEROBIC AND ANAEROBIC Blood Culture adequate volume Performed at [facility] [address] [place], [place] [address]    Culture   Final    NO GROWTH 5 DAYS Performed at [facility], [place] [address] [place], [place] [address]    Report Status 11/15/2020 FINAL  Final  Culture, blood (routine x 2)     Status: None   Collection Time: 11/10/20  2:22 PM   Specimen: BLOOD  Result Value Ref Range Status   Specimen Description   Final    BLOOD RIGHT ANTECUBITAL Performed at [facility] [address], [place], [place]  [address]    Special Requests   Final    BOTTLES DRAWN AEROBIC AND ANAEROBIC Blood Culture adequate volume Performed at [facility] [address] [place], [place] [address]    Culture   Final    NO GROWTH 5 DAYS Performed at [facility], [place] [address] [place], [place] [address]    Report Status 11/15/2020 FINAL  Final  Surgical pcr screen     Status: Abnormal   Collection Time: 11/13/20  6:25 AM  Result Value Ref Range Status   MRSA, PCR NEGATIVE NEGATIVE Final   Staphylococcus aureus POSITIVE (A) NEGATIVE Final    Comment: (NOTE) The Xpert SA Assay (FDA approved for NASAL specimens in patients 91 years of age and older), is one component of a comprehensive surveillance program. It is not intended to diagnose infection nor to guide or monitor treatment. Performed at [facility], [place] [address] [place],  [address]   Aerobic/Anaerobic Culture w Gram Stain (surgical/deep wound)     Status: None   Collection Time: 11/13/20  5:31 PM   Specimen: Wound  Result Value Ref Range Status   Specimen Description JOINT FLUID RIGHT KNEE  Final   Special Requests SWABS  Final   Gram Stain   Final    NO SQUAMOUS EPITHELIAL CELLS SEEN FEW WBC SEEN NO ORGANISMS SEEN    Culture   Final    RARE STAPHYLOCOCCUS AUREUS NO ANAEROBES ISOLATED Performed at [facility], [place] [address] [place],  [address]    Report Status 11/18/2020 FINAL  Final   Organism ID, Bacteria STAPHYLOCOCCUS AUREUS  Final      Susceptibility   Staphylococcus aureus - MIC*    CIPROFLOXACIN >=8 RESISTANT Resistant     ERYTHROMYCIN <=0.25 SENSITIVE Sensitive     GENTAMICIN <=0.5 SENSITIVE Sensitive     OXACILLIN <=0.25 SENSITIVE Sensitive     TETRACYCLINE <=1 SENSITIVE Sensitive     VANCOMYCIN 1 SENSITIVE Sensitive     TRIMETH/SULFA <=10 SENSITIVE Sensitive     CLINDAMYCIN <=0.25 SENSITIVE Sensitive     RIFAMPIN <=0.5 SENSITIVE Sensitive     Inducible  Clindamycin NEGATIVE Sensitive     * RARE STAPHYLOCOCCUS AUREUS  Aerobic/Anaerobic Culture w Gram Stain (surgical/deep wound)     Status: None (Preliminary result)   Collection Time: 11/13/20  5:48 PM   Specimen: Bone; Tissue  Result Value Ref Range Status   Specimen Description BONE RIGHT PROXIMAL TIBIA  Final   Special Requests NONE  Final   Gram Stain   Final    NO SQUAMOUS EPITHELIAL CELLS SEEN FEW WBC SEEN NO ORGANISMS SEEN  Culture   Final    RARE STAPHYLOCOCCUS AUREUS RARE STAPHYLOCOCCUS CAPITIS NO ANAEROBES ISOLATED; CULTURE IN PROGRESS FOR 5 DAYS    Report Status PENDING  Incomplete   Organism ID, Bacteria STAPHYLOCOCCUS AUREUS  Final   Organism ID, Bacteria STAPHYLOCOCCUS CAPITIS  Final      Susceptibility   Staphylococcus aureus - MIC*    CIPROFLOXACIN >=8 RESISTANT Resistant     ERYTHROMYCIN <=0.25 SENSITIVE Sensitive     GENTAMICIN <=0.5 SENSITIVE Sensitive     OXACILLIN <=0.25 SENSITIVE Sensitive     TETRACYCLINE <=1 SENSITIVE Sensitive     VANCOMYCIN 1 SENSITIVE Sensitive     TRIMETH/SULFA <=10 SENSITIVE Sensitive     CLINDAMYCIN <=0.25 SENSITIVE Sensitive     RIFAMPIN <=0.5 SENSITIVE Sensitive     Inducible Clindamycin Value in next row Sensitive      NEGATIVEPerformed at [facility] [address], [place], [place] [address]    * RARE STAPHYLOCOCCUS AUREUS   Staphylococcus capitis - MIC*    CIPROFLOXACIN Value in next row Sensitive      NEGATIVEPerformed at [facility] [address], [place], [place] [address]    ERYTHROMYCIN Value in next row Sensitive      NEGATIVEPerformed at [place] [address] [place], [place] [address]    GENTAMICIN Value in next row Sensitive      NEGATIVEPerformed at [place] [address], [place], [place] [address]    OXACILLIN Value in next row Sensitive      NEGATIVEPerformed at [place] [address], [place], [place] [address]    TETRACYCLINE Value in next row  Sensitive      NEGATIVEPerformed at [facility] [address] [place],  [address]    VANCOMYCIN Value in next row Sensitive      NEGATIVEPerformed at [place] [address] [place],  [address]    TRIMETH/SULFA Value in next row Sensitive      NEGATIVEPerformed at [facility] [address], [place], [place] [address]    CLINDAMYCIN Value in next row Sensitive      NEGATIVEPerformed at [place] [address], [place], [place] [address]    RIFAMPIN Value in next row Sensitive      NEGATIVEPerformed at [place] [address] [place], [place] [address]    Inducible Clindamycin Value in next row Sensitive      NEGATIVEPerformed at [facility] [address] [place], [place] [address]    * RARE STAPHYLOCOCCUS CAPITIS  SARS CORONAVIRUS 2 (TAT 6-24 HRS) Nasopharyngeal Nasopharyngeal Swab     Status: None   Collection Time: 11/18/20  4:25 PM   Specimen: Nasopharyngeal Swab  Result Value Ref Range Status   SARS Coronavirus 2 NEGATIVE NEGATIVE Final    Comment: (NOTE) SARS-CoV-2 target nucleic acids are NOT DETECTED.  The SARS-CoV-2 RNA is generally detectable in upper and lower respiratory specimens during the acute phase of infection. Negative results do not preclude SARS-CoV-2 infection, do not rule out co-infections with other pathogens, and should not be used as the sole basis for treatment or other patient management decisions. Negative results must be combined with clinical observations, patient history, and epidemiological information. The expected result is Negative.  Fact Sheet for Patients: SugarRoll.be  Fact Sheet for Healthcare Providers: https://www.woods-mathews.com/  This test is not yet approved or cleared by the [place]  States FDA and  has been authorized for detection and/or diagnosis of SARS-CoV-2 by FDA under an Emergency Use Authorization (EUA). This  EUA will remain  in effect (meaning this test can be used) for the duration of the COVID-19 declaration under Se ction 564(b)(1) of the Act, 21 U.S.C. section 360bbb-3(b)(1), unless the authorization is terminated or revoked sooner.  Performed at [facility], [place] [address] [place], [place] [address]      Time coordinating discharge: 35 minutes  SIGNED:   Cordelia Poche, MD [facility] 11/19/2020, 10:52 AM

## 2020-12-07 ENCOUNTER — Ambulatory Visit (INDEPENDENT_AMBULATORY_CARE_PROVIDER_SITE_OTHER): Payer: Managed Care, Other (non HMO) | Admitting: Internal Medicine

## 2020-12-07 ENCOUNTER — Other Ambulatory Visit: Payer: Self-pay

## 2020-12-07 ENCOUNTER — Encounter: Payer: Self-pay | Admitting: Internal Medicine

## 2020-12-07 VITALS — BP 123/75 | HR 80 | Temp 98.5°F

## 2020-12-07 DIAGNOSIS — M00061 Staphylococcal arthritis, right knee: Secondary | ICD-10-CM | POA: Diagnosis not present

## 2020-12-07 DIAGNOSIS — Z5181 Encounter for therapeutic drug level monitoring: Secondary | ICD-10-CM | POA: Diagnosis not present

## 2020-12-07 DIAGNOSIS — M86269 Subacute osteomyelitis, unspecified tibia and fibula: Secondary | ICD-10-CM

## 2020-12-08 ENCOUNTER — Encounter: Payer: Self-pay | Admitting: Internal Medicine

## 2020-12-08 DIAGNOSIS — Z5181 Encounter for therapeutic drug level monitoring: Secondary | ICD-10-CM | POA: Insufficient documentation

## 2020-12-08 LAB — COMPREHENSIVE METABOLIC PANEL
AG Ratio: 1.4 (calc) (ref 1.0–2.5)
ALT: 4 U/L — ABNORMAL LOW (ref 6–29)
AST: 16 U/L (ref 10–35)
Albumin: 3.6 g/dL (ref 3.6–5.1)
Alkaline phosphatase (APISO): 106 U/L (ref 37–153)
BUN/Creatinine Ratio: 13 (calc) (ref 6–22)
BUN: 6 mg/dL — ABNORMAL LOW (ref 7–25)
CO2: 26 mmol/L (ref 20–32)
Calcium: 9.5 mg/dL (ref 8.6–10.4)
Chloride: 105 mmol/L (ref 98–110)
Creat: 0.46 mg/dL — ABNORMAL LOW (ref 0.50–1.03)
Globulin: 2.6 g/dL (calc) (ref 1.9–3.7)
Glucose, Bld: 169 mg/dL — ABNORMAL HIGH (ref 65–99)
Potassium: 4.3 mmol/L (ref 3.5–5.3)
Sodium: 138 mmol/L (ref 135–146)
Total Bilirubin: 0.3 mg/dL (ref 0.2–1.2)
Total Protein: 6.2 g/dL (ref 6.1–8.1)

## 2020-12-08 LAB — CBC WITH DIFFERENTIAL/PLATELET
Absolute Monocytes: 389 cells/uL (ref 200–950)
Basophils Absolute: 59 cells/uL (ref 0–200)
Basophils Relative: 1.6 %
Eosinophils Absolute: 100 cells/uL (ref 15–500)
Eosinophils Relative: 2.7 %
HCT: 33.9 % — ABNORMAL LOW (ref 35.0–45.0)
Hemoglobin: 10.8 g/dL — ABNORMAL LOW (ref 11.7–15.5)
Lymphs Abs: 814 cells/uL — ABNORMAL LOW (ref 850–3900)
MCH: 29.4 pg (ref 27.0–33.0)
MCHC: 31.9 g/dL — ABNORMAL LOW (ref 32.0–36.0)
MCV: 92.4 fL (ref 80.0–100.0)
MPV: 11.3 fL (ref 7.5–12.5)
Monocytes Relative: 10.5 %
Neutro Abs: 2338 cells/uL (ref 1500–7800)
Neutrophils Relative %: 63.2 %
Platelets: 215 10*3/uL (ref 140–400)
RBC: 3.67 10*6/uL — ABNORMAL LOW (ref 3.80–5.10)
RDW: 15.3 % — ABNORMAL HIGH (ref 11.0–15.0)
Total Lymphocyte: 22 %
WBC: 3.7 10*3/uL — ABNORMAL LOW (ref 3.8–10.8)

## 2020-12-08 LAB — C-REACTIVE PROTEIN: CRP: 2.9 mg/L (ref <8.0)

## 2020-12-08 LAB — SEDIMENTATION RATE: Sed Rate: 29 mm/h (ref 0–30)

## 2020-12-08 NOTE — Assessment & Plan Note (Signed)
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  On treatment and due to knee replacement after treatment completion.

## 2020-12-08 NOTE — Assessment & Plan Note (Signed)
Significant septic arthritis and tolerating treatment well.  Will continue with plan for 6 weeks of IV antibiotics through December 1.

## 2020-12-08 NOTE — Assessment & Plan Note (Signed)
No labs done at rehab so will check with this visit and request.

## 2020-12-08 NOTE — Progress Notes (Signed)
   Subjective:    Patient ID: Molly [name], female    DOB: 07/01/1970, 50 y.o.   MRN: [id number]  HPI She is here for follow up of a right septic knee and osteomyelitis with MSSA> She underwent I and D on 10/21 for septic arthritis and required excision debridement of bone, cartilage and synovium with a spacer placement.  She was placed on cefazolin for a projected 6 weeks through 12/24/20 and here for follow up.  She is doing relatively well with no associated rash or diarrhea.  She has followed up with Dr. Linna Caprice and plan for knee replacement several weeks after treatment completion.  Some pain in the knee but improved.    Review of Systems  Constitutional:  Negative for fatigue and fever.  Gastrointestinal:  Negative for diarrhea and nausea.  Skin:  Negative for rash.      Objective:   Physical Exam Eyes:     General: No scleral icterus. Pulmonary:     Effort: Pulmonary effort is normal.  Skin:    Findings: No rash.  Neurological:     General: No focal deficit present.     Mental Status: She is alert.  Psychiatric:        Mood and Affect: Mood normal.    SH: no tobacco      Assessment & Plan:

## 2021-01-04 ENCOUNTER — Ambulatory Visit: Payer: Self-pay | Admitting: Internal Medicine

## 2021-01-30 DIAGNOSIS — Z20822 Contact with and (suspected) exposure to covid-19: Secondary | ICD-10-CM | POA: Diagnosis not present

## 2021-01-31 DIAGNOSIS — Z20822 Contact with and (suspected) exposure to covid-19: Secondary | ICD-10-CM | POA: Diagnosis not present

## 2021-02-01 DIAGNOSIS — Z20822 Contact with and (suspected) exposure to covid-19: Secondary | ICD-10-CM | POA: Diagnosis not present

## 2021-02-02 ENCOUNTER — Ambulatory Visit: Payer: Self-pay | Admitting: Student

## 2021-02-04 ENCOUNTER — Ambulatory Visit: Payer: Self-pay | Admitting: Student

## 2021-02-12 ENCOUNTER — Ambulatory Visit: Payer: Self-pay | Admitting: Orthopedic Surgery

## 2021-02-12 NOTE — H&P (Signed)
TOTAL KNEE REVISION ADMISSION H&P  Patient is being admitted for right revision total knee arthroplasty.  Subjective:  Chief Complaint:right knee pain.  HPI: Molly [name], 51 y.o. female, has a history of pain and functional disability in the right knee(s) due to  chronic NSA R knee  and patient has failed non-surgical conservative treatments for greater than 12 weeks to include flexibility and strengthening excercises, supervised PT with diminished ADL's post treatment, use of assistive devices, and activity modification. The indications for the revision of the total knee arthroplasty are  s/p I&D articulating spacer . Onset of symptoms was abrupt starting 1 years ago with stable course since that time.  Prior procedures on the right knee(s) include  I&D, articulating spacer .  Patient currently rates pain in the right knee(s) at 1 out of 10 with activity. There is  articulating spacer .  Patient has evidence of  as/p spacer  by imaging studies. This condition presents safety issues increasing the risk of falls. This patient has had  MSSA NSA .  There is no current active infection.  Patient Active Problem List   Diagnosis Date Noted   Medication monitoring encounter 12/08/2020   Osteomyelitis ([place]) 11/19/2020   Protein-calorie malnutrition, severe 11/11/2020   Unspecified severe protein-calorie malnutrition ([place]) 11/10/2020   Septic joint of right knee joint ([place]) 11/09/2020   Sinusitis 04/20/2017   Otalgia of both ears 03/30/2016   Strain of chest wall 11/30/2015   Amenorrhea, secondary 03/02/2015   Recurrent acute sinusitis 05/23/2014   Anemia 04/08/2014   Diabetic ketoacidosis without coma associated with type 1 diabetes mellitus (HCC)    HLD (hyperlipidemia) 04/07/2014   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   DKA (diabetic ketoacidoses) 04/06/2014   Nausea & vomiting 04/06/2014   Bipolar 1 disorder ([place])    Pain in joint, upper arm 10/22/2013   Routine general medical  examination at a health care facility 10/04/2013   Visit for occupational health examination 11/10/2012   Nonspecific abnormal electrocardiogram (ECG) (EKG) 10/02/2012   Osteoporosis 10/02/2012   Quit smoking 04/24/2012   Arthralgia of hand, left 12/01/2011   Encounter for long-term (current) use of other medications 09/14/2010   HEMATURIA UNSPECIFIED 08/11/2009   PURE HYPERCHOLESTEROLEMIA 08/05/2008   THROMBOCYTOPENIA 05/07/2008   CONTUSION, ANKLE 08/28/2007   TINEA VERSICOLOR 07/13/2007   SEBACEOUS CYST, INFECTED 07/13/2007   COUGH 04/18/2007   Migraine 03/06/2007   Seasonal and perennial allergic rhinitis 03/06/2007   Type 1 diabetes mellitus ([place]) 08/03/2006   Depression 08/03/2006   Past Medical History:  Diagnosis Date   ALLERGIC RHINITIS CAUSE UNSPECIFIED 03/06/2007   Bipolar 1 disorder ([place])    CONTUSION, ANKLE 08/28/2007   DEPRESSION 08/03/2006   DIABETES MELLITUS, TYPE I 08/03/2006   DM retinopathy ([place])    Encounter for long-term (current) use of other medications    Hyperlipidemia    MIGRAINE, CHRONIC 03/06/2007   Osteoporosis    SEBACEOUS CYST, INFECTED 07/13/2007   SMOKER 08/11/2009   THROMBOCYTOPENIA 05/07/2008   TINEA VERSICOLOR 07/13/2007    Past Surgical History:  Procedure Laterality Date   Abnormal U/S  12/10/1996   BIOPSY  11/16/2020   Procedure: BIOPSY;  Surgeon: Wilford Corner, MD;  Location: [facility];  Service: Endoscopy;;   ELECTROCARDIOGRAM  06/13/2006   ESOPHAGOGASTRODUODENOSCOPY (EGD) WITH PROPOFOL N/A 11/16/2020   Procedure: ESOPHAGOGASTRODUODENOSCOPY (EGD) WITH PROPOFOL;  Surgeon: Wilford Corner, MD;  Location: [facility];  Service: Endoscopy;  Laterality: N/A;   KNEE ARTHROPLASTY Right 11/13/2020   Procedure:  DEBRIDEMENT KNEE WITH PLACEMENT ARTICULATING ANTIBIOTIC SPACER;  Surgeon: Rod Can, MD;  Location: [facility];  Service: Orthopedics;  Laterality: Right;  cultures,zimmer cement, tobramycin 2.4gm per pack of cement(will need 4)    TUBAL LIGATION      Current Outpatient Medications  Medication Sig Dispense Refill Last Dose   Alcohol Swabs (ALCOHOL PREP) 70 % PADS 8/day  250.01 800 each 3    amLODipine (NORVASC) 10 MG tablet Take 10 mg by mouth daily.      Calcium-Magnesium-Vitamin D (CALCIUM 1200+D3 PO) Take 1 tablet by mouth daily.      carbamazepine (TEGRETOL) 200 MG tablet Take 1 tablet (200 mg total) by mouth every morning AND 5 tablets (1,000 mg total) at bedtime. (Patient taking differently: Take 1 tablet (200 mg total) by mouth every morning AND 6 tablets (1,200 mg total) at bedtime.)      cetirizine-pseudoephedrine (ZYRTEC-D) 5-120 MG tablet Take 1 tablet by mouth daily. 90 tablet 3    cyclobenzaprine (FLEXERIL) 10 MG tablet Take 20 mg by mouth at bedtime.      feeding supplement, GLUCERNA SHAKE, (GLUCERNA SHAKE) LIQD Take 237 mLs by mouth 3 (three) times daily between meals. (Patient taking differently: Take 237 mLs by mouth 2 (two) times daily between meals.)  0    Fluticasone Propionate, Inhal, (FLOVENT DISKUS) 250 MCG/ACT AEPB Inhale 1 puff into the lungs in the morning and at bedtime.      glucose blood (FREESTYLE LITE) test strip 8/day dx 250.01, and lancets 800 each 3    Insulin Infusion Pump Supplies (MINIMED INFUSION SET-MMT 398) MISC Inject 1 Device into the skin 3 days. Change every 3 days (Patient not taking: Reported on 11/09/2020) 30 each 3    Insulin Infusion Pump Supplies (PARADIGM RESERVOIR 3ML) MISC Inject 1 Device into the skin 3 days. Change every 3 days (Patient not taking: Reported on 12/07/2020) 30 each 3    lamoTRIgine (LAMICTAL) 200 MG tablet Take 400 mg by mouth daily.      Lancets (FREESTYLE) lancets Use as instructed dx 250.01 800 each 3    lithium carbonate (LITHOBID) 300 MG CR tablet Take 300 mg by mouth every evening.      LYUMJEV 100 UNIT/ML SOLN Insulin pump (Patient taking differently: 200 Units. Via Insulin pump)      Multiple Vitamin (MULTIVITAMIN) tablet Take 1 tablet by mouth  daily.      oxyCODONE-acetaminophen (PERCOCET/ROXICET) 5-325 MG tablet Take 1-2 tablets by mouth every 4 (four) hours as needed for severe pain or moderate pain. 30 tablet 0    polyethylene glycol (MIRALAX / GLYCOLAX) 17 g packet Take 17 g by mouth daily as needed for mild constipation. (Patient taking differently: Take 17 g by mouth daily.)      Potassium 99 MG TABS Take 99 mg by mouth daily.      promethazine (PHENERGAN) 25 MG tablet Take 25 mg by mouth every 8 (eight) hours as needed for nausea or vomiting.      rosuvastatin (CRESTOR) 40 MG tablet TAKE 1 TABLET BY MOUTH DAILY GENERIC EQUIVALENT FOR CRESTOR (Patient taking differently: Take 40 mg by mouth daily.) 90 tablet 0    ziprasidone (GEODON) 60 MG capsule Take 120 mg by mouth every evening.      zolpidem (AMBIEN) 10 MG tablet Take 2 tablets (20 mg total) by mouth at bedtime. 4 tablet 0    No current facility-administered medications for this visit.   Allergies  Allergen Reactions   Varenicline Tartrate  Rash    Chantix    Social History   Tobacco Use   Smoking status: Former    Types: Cigarettes    Quit date: 10/16/2010    Years since quitting: 10.3   Smokeless tobacco: Never  Substance Use Topics   Alcohol use: No    Family History  Problem Relation Age of Onset   Diabetes Father        (Oral Agents)   Diabetes Paternal Grandmother    Hypertension Paternal Grandmother    Diabetes Paternal Grandfather    Cancer Paternal Grandfather        colon   Hypertension Paternal Grandfather       Review of Systems  Constitutional: Negative.     Objective:  Physical Exam Constitutional:      Appearance: Normal appearance.  HENT:     Head: Normocephalic and atraumatic.     Nose: Nose normal.     Mouth/Throat:     Mouth: Mucous membranes are dry.     Pharynx: Oropharynx is clear.  Eyes:     Extraocular Movements: Extraocular movements intact.     Pupils: Pupils are equal, round, and reactive to light.  Cardiovascular:      Rate and Rhythm: Normal rate and regular rhythm.  Pulmonary:     Effort: Pulmonary effort is normal.  Abdominal:     General: Abdomen is flat.     Palpations: Abdomen is soft.  Genitourinary:    Comments: deferred Musculoskeletal:       Legs:     Comments: No swelling or effusion, can SLR, good ROM  Skin:    General: Skin is warm and dry.     Capillary Refill: Capillary refill takes less than 2 seconds.  Neurological:     General: No focal deficit present.     Mental Status: She is alert and oriented to person, place, and time.  Psychiatric:        Mood and Affect: Mood normal.        Behavior: Behavior normal.        Thought Content: Thought content normal.        Judgment: Judgment normal.    Vital signs in last 24 hours: @VSRANGES @  Labs:  Estimated body mass index is 24.38 kg/m as calculated from the following:   Height as of 11/16/20: 5\' 7"  (1.702 m).   Weight as of 11/17/20: 70.6 kg.  Imaging Review Plain radiographs demonstrate  s/p articulating spacer  of the right knee(s). The overall alignment is neutral. The bone quality appears to be  adequate w medial tibial defect  for age and reported activity level.     Assessment/Plan:  R knee NSA, right knee(s) with articulating spacer.   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 Revision total knee arthroplasty is deemed medically necessary. The treatment options including medical management, injection therapy, arthroscopy and revision arthroplasty were discussed at length. The risks and benefits of revision total knee arthroplasty were presented and reviewed. The risks due to aseptic loosening, infection, stiffness, patella tracking problems, thromboembolic complications and other imponderables were discussed. The patient acknowledged the  explanation, agreed to proceed with the plan and consent was signed. Patient is being admitted for inpatient treatment for surgery, pain control, PT, OT, prophylactic antibiotics, VTE prophylaxis, progressive ambulation and ADL's and discharge planning.The patient is planning to be discharged  home w OPPT

## 2021-02-15 NOTE — Patient Instructions (Addendum)
DUE TO COVID-19 ONLY ONE VISITOR IS ALLOWED TO COME WITH YOU AND STAY IN THE WAITING ROOM ONLY DURING PRE OP AND PROCEDURE.   **NO VISITORS ARE ALLOWED IN THE SHORT STAY AREA OR RECOVERY ROOM!!**  IF YOU WILL BE ADMITTED INTO THE HOSPITAL YOU ARE ALLOWED ONLY TWO SUPPORT PEOPLE DURING VISITATION HOURS ONLY (7 AM -8PM)   The support person(s) must pass our screening, gel in and out, and wear a mask at all times, including in the patients room. Patients must also wear a mask when staff or their support person are in the room. Visitors GUEST BADGE MUST BE WORN VISIBLY  One adult visitor may remain with you overnight and MUST be in the room by 8 P.M.  No visitors under the age of 84. Any visitor under the age of 50 must be accompanied by an adult.    COVID SWAB TESTING MUST BE COMPLETED ON:  02/16/21 @ 9:00 AM   Site: [facility] Molly [name] Enter: Main Entrance have a seat in the waiting area to the right of main entrance (DO NOT [place]!!!!!) Dial: [phone] to alert staff you have arrived  You are not required to quarantine, however you are required to wear a well-fitted mask when you are out and around people not in your household.  Hand Hygiene often Do NOT share personal items Notify your provider if you are in close contact with someone who has COVID or you develop fever 100.4 or greater, new onset of sneezing, cough, sore throat, shortness of breath or body aches.  Molly [name], [address], must go inside of the hospital, NOT A DRIVE THRU!  (Must self quarantine after testing. Follow instructions on handout.)       Your procedure is scheduled on: 02/18/21   Report to [facility] Main Entrance    Report to admitting at : 7:45 AM   Call this number if you have problems the morning of surgery [phone]   Do not eat food :After Midnight.   May have liquids until  : 7:30 AM   day of surgery  CLEAR LIQUID DIET  Foods Allowed                                                                     Foods Excluded  Water, Black Coffee and tea, regular and decaf                             liquids that you cannot  Plain Jell-O in any flavor  (No red)                                           see through such as: Fruit ices (not with fruit pulp)                                     milk, soups, orange juice  Iced Popsicles (No red)                                    All solid food                                   Apple juices Sports drinks like Gatorade (No red) Lightly seasoned clear broth or consume(fat free) Sugar Sample Menu Breakfast                                Lunch                                     Supper Cranberry juice                    Beef broth                            Chicken broth Jell-O                                     Grape juice                           Apple juice Coffee or tea                        Jell-O                                      Popsicle                                                Coffee or tea                        Coffee or tea    Complete one Gatorade drink the morning of surgery 3 hours prior to scheduled surgery at: 7:30 AM.    The day of surgery:  Drink ONE (1) Pre-Surgery Clear Ensure or G2 at AM the morning of surgery. Drink in one sitting. Do not sip.  This drink was given to you during your hospital  pre-op appointment visit. Nothing else to drink after completing the  Pre-Surgery Clear Ensure or G2.          If you have questions, please contact your surgeons office  Oral Hygiene is also important to reduce your risk of infection.                                    Remember - BRUSH YOUR TEETH THE MORNING OF SURGERY WITH YOUR REGULAR TOOTHPASTE   Do NOT smoke after Midnight   Take these medicines the morning of surgery with A SIP OF WATER:  tegretol,lamotrigine,amlodipine,cetirizine. How to Manage Your Diabetes Before and After Surgery  Why is it important to control my blood sugar before and after surgery? Improving blood sugar levels before and after surgery helps healing and can limit problems. A way of improving blood sugar control is eating a healthy diet by:  Eating less sugar and carbohydrates  Increasing activity/exercise  Talking with your doctor about reaching your blood sugar goals High blood sugars (greater than 180 mg/dL) can raise your risk of infections and slow your recovery, so you will need to focus on controlling your diabetes during the weeks before surgery. Make sure that the doctor who takes care of your diabetes knows about your planned surgery including the date and location.  How do I manage my blood sugar before surgery? Check your blood sugar at least 4 times a day, starting 2 days before surgery, to make sure that the level is not too high or low. Check your blood sugar the morning of your surgery when you wake up and every 2 hours until you get to the Short Stay unit. If your blood sugar is less than 70 mg/dL, you will need to treat for low blood sugar: Do not take insulin. Treat a low blood sugar (less than 70 mg/dL) with  cup of clear juice (cranberry or apple), 4 glucose tablets, OR glucose gel. Recheck blood sugar in 15 minutes after treatment (to make sure it is greater than 70 mg/dL). If your blood sugar is not greater than 70 mg/dL on recheck, call [phone] for further instructions. Report your blood sugar to the short stay nurse when you get to Short Stay.  If you are admitted to the hospital after surgery: Your blood sugar will be checked by the staff and you will probably be given insulin after surgery (instead of oral diabetes medicines) to make sure you have good blood sugar levels. The goal for blood sugar control after surgery is 80-180 mg/dL.   WHAT DO I DO ABOUT MY DIABETES  MEDICATION?    For patients with insulin pumps: Contact your diabetes doctor for specific instructions before surgery. Decrease basal rates by 20% at midnight the night before your surgery. Note that if your surgery is planned to be longer than 2 hours, your insulin pump will be removed and intravenous (IV) insulin will be started and managed by the nurses and the anesthesiologist. You will be able to restart your insulin pump once you are awake and able to manage it.  Make sure to bring insulin pump supplies to the hospital with you in case the  site needs to be changed.  Patient Signature:  Date:   Nurse Signature:  Date:   Reviewed and Endorsed by [facility] Patient Education Committee, August 2015   DO NOT TAKE ANY ORAL DIABETIC MEDICATIONS DAY OF YOUR SURGERY                              You may not have any metal on your body including hair pins, jewelry, and body piercing             Do not wear make-up, lotions, powders, perfumes/cologne, or deodorant  Do not wear nail polish including gel and S&S, artificial/acrylic nails, or any other type of covering on natural nails including finger and toenails. If you have artificial nails, gel coating, etc. that needs to be removed by a nail salon please have this removed prior to surgery  or surgery may need to be canceled/ delayed if the surgeon/ anesthesia feels like they are unable to be safely monitored.   Do not shave  48 hours prior to surgery.    Do not bring valuables to the hospital. [place] IS NOT             RESPONSIBLE   FOR VALUABLES.   Contacts, dentures or bridgework may not be worn into surgery.   Bring small overnight bag day of surgery.    Patients discharged on the day of surgery will not be allowed to drive home.  Someone needs to stay with you for the first 24 hours after anesthesia.   Special Instructions: Bring a copy of your healthcare power of attorney and living will documents         the day of surgery  if you haven't scanned them before.              Please read over the following fact sheets you were given: IF YOU HAVE QUESTIONS ABOUT YOUR PRE-OP INSTRUCTIONS PLEASE CALL [phone]     [facility] - Preparing for Surgery Before surgery, you can play an important role.  Because skin is not sterile, your skin needs to be as free of germs as possible.  You can reduce the number of germs on your skin by washing with CHG (chlorahexidine gluconate) soap before surgery.  CHG is an antiseptic cleaner which kills germs and bonds with the skin to continue killing germs even after washing. Please DO NOT use if you have an allergy to CHG or antibacterial soaps.  If your skin becomes reddened/irritated stop using the CHG and inform your nurse when you arrive at Short Stay. Do not shave (including legs and underarms) for at least 48 hours prior to the first CHG shower.  You may shave your face/neck. Please follow these instructions carefully:  1.  Shower with CHG Soap the night before surgery and the  morning of Surgery.  2.  If you choose to wash your hair, wash your hair first as usual with your  normal  shampoo.  3.  After you shampoo, rinse your hair and body thoroughly to remove the  shampoo.                           4.  Use CHG as you would any other liquid soap.  You can apply chg directly  to the skin and wash                       Gently with a scrungie or clean washcloth.  5.  Apply the CHG Soap to your body ONLY FROM THE NECK DOWN.   Do not use on face/ open                           Wound or open sores. Avoid contact with eyes, ears mouth and genitals (private parts).                       Wash face,  Genitals (private parts) with your normal soap.             6.  Wash thoroughly, paying special attention to the area where your surgery  will be performed.  7.  Thoroughly rinse your body with warm water from the neck down.  8.  DO  NOT shower/wash with your normal soap after using and rinsing off   the CHG Soap.                9.  Pat yourself dry with a clean towel.            10.  Wear clean pajamas.            11.  Place clean sheets on your bed the night of your first shower and do not  sleep with pets. Day of Surgery : Do not apply any lotions/deodorants the morning of surgery.  Please wear clean clothes to the hospital/surgery center.  FAILURE TO FOLLOW THESE INSTRUCTIONS MAY RESULT IN THE CANCELLATION OF YOUR SURGERY PATIENT SIGNATURE_________________________________  NURSE SIGNATURE__________________________________  ________________________________________________________________________   Molly [name]  An incentive spirometer is a tool that can help keep your lungs clear and active. This tool measures how well you are filling your lungs with each breath. Taking long deep breaths may help reverse or decrease the chance of developing breathing (pulmonary) problems (especially infection) following: A long period of time when you are unable to move or be active. BEFORE THE PROCEDURE  If the spirometer includes an indicator to show your best effort, your nurse or respiratory therapist will set it to a desired goal. If possible, sit up straight or lean slightly forward. Try not to slouch. Hold the incentive spirometer in an upright position. INSTRUCTIONS FOR USE  Sit on the edge of your bed if possible, or sit up as far as you can in bed or on a chair. Hold the incentive spirometer in an upright position. Breathe out normally. Place the mouthpiece in your mouth and seal your lips tightly around it. Breathe in slowly and as deeply as possible, raising the piston or the ball toward the top of the column. Hold your breath for 3-5 seconds or for as long as possible. Allow the piston or ball to fall to the bottom of the column. Remove the mouthpiece from your mouth and breathe out normally. Rest for a few seconds and repeat Steps 1 through 7 at least 10 times every 1-2  hours when you are awake. Take your time and take a few normal breaths between deep breaths. The spirometer may include an indicator to show your best effort. Use the indicator as a goal to work toward during each repetition. After each set of 10 deep breaths, practice coughing to be sure your lungs are clear. If you have an incision (the cut made at the time of surgery), support your incision when coughing by placing a pillow or rolled up towels firmly against it. Once you are able to get out of bed, walk around indoors and cough well. You may stop using the incentive spirometer when instructed by your caregiver.  RISKS AND COMPLICATIONS Take your time so you do not get dizzy or light-headed. If you are in pain, you may need to take or ask for pain medication before doing incentive spirometry. It is harder to take a deep breath if you are having pain. AFTER USE Rest and breathe slowly and easily. It can be helpful to keep track of a log of your progress. Your caregiver can provide you with a simple table to help with this. If you are using the spirometer at home, follow these instructions: [place] IF:  You are having difficultly using the spirometer. You have trouble using the spirometer as often as instructed. Your pain medication is not  giving enough relief while using the spirometer. You develop fever of 100.5 F (38.1 C) or higher. SEEK IMMEDIATE MEDICAL CARE IF:  You cough up bloody sputum that had not been present before. You develop fever of 102 F (38.9 C) or greater. You develop worsening pain at or near the incision site. MAKE SURE YOU:  Understand these instructions. Will watch your condition. Will get help right away if you are not doing well or get worse. Document Released: 05/23/2006 Document Revised: 04/04/2011 Document Reviewed: 07/24/2006 [facility] Patient Information 2014 [place],  [place].   ________________________________________________________________________

## 2021-02-16 ENCOUNTER — Other Ambulatory Visit: Payer: Self-pay

## 2021-02-16 ENCOUNTER — Encounter (HOSPITAL_COMMUNITY)
Admission: RE | Admit: 2021-02-16 | Discharge: 2021-02-16 | Disposition: A | Discharge status: Home / Self Care | Payer: 59 | Source: Ambulatory Visit | Attending: Orthopedic Surgery | Admitting: Orthopedic Surgery

## 2021-02-16 ENCOUNTER — Encounter (HOSPITAL_COMMUNITY): Payer: Self-pay | Admitting: Physician Assistant

## 2021-02-16 ENCOUNTER — Encounter (HOSPITAL_COMMUNITY): Admission: RE | Admit: 2021-02-16 | Payer: 59 | Source: Ambulatory Visit

## 2021-02-16 ENCOUNTER — Encounter (HOSPITAL_COMMUNITY): Payer: Self-pay

## 2021-02-16 VITALS — BP 149/81 | HR 124 | Temp 98.4°F | Ht 67.0 in | Wt 139.0 lb

## 2021-02-16 DIAGNOSIS — E1010 Type 1 diabetes mellitus with ketoacidosis without coma: Secondary | ICD-10-CM

## 2021-02-16 DIAGNOSIS — Z01812 Encounter for preprocedural laboratory examination: Secondary | ICD-10-CM | POA: Insufficient documentation

## 2021-02-16 DIAGNOSIS — Z01818 Encounter for other preprocedural examination: Secondary | ICD-10-CM

## 2021-02-16 HISTORY — DX: Essential (primary) hypertension: I10

## 2021-02-16 LAB — BASIC METABOLIC PANEL
Anion gap: 14 (ref 5–15)
BUN: 23 mg/dL — ABNORMAL HIGH (ref 6–20)
CO2: 21 mmol/L — ABNORMAL LOW (ref 22–32)
Calcium: 9.6 mg/dL (ref 8.9–10.3)
Chloride: 95 mmol/L — ABNORMAL LOW (ref 98–111)
Creatinine, Ser: 0.83 mg/dL (ref 0.44–1.00)
GFR, Estimated: >60 mL/min (ref >60)
Glucose, Bld: 718 mg/dL (ref 70–99)
Potassium: 6 mmol/L — ABNORMAL HIGH (ref 3.5–5.1)
Sodium: 130 mmol/L — ABNORMAL LOW (ref 135–145)

## 2021-02-16 LAB — HEMOGLOBIN A1C
Hgb A1c MFr Bld: 8.4 % — ABNORMAL HIGH (ref 4.8–5.6)
Mean Plasma Glucose: 194 mg/dL

## 2021-02-16 LAB — CBC
HCT: 40.4 % (ref 36.0–46.0)
Hemoglobin: 13.1 g/dL (ref 12.0–15.0)
MCH: 31 pg (ref 26.0–34.0)
MCHC: 32.4 g/dL (ref 30.0–36.0)
MCV: 95.7 fL (ref 80.0–100.0)
Platelets: 185 10*3/uL (ref 150–400)
RBC: 4.22 MIL/uL (ref 3.87–5.11)
RDW: 11.9 % (ref 11.5–15.5)
WBC: 5.1 10*3/uL (ref 4.0–10.5)
nRBC: 0 % (ref 0.0–0.2)

## 2021-02-16 LAB — SURGICAL PCR SCREEN
MRSA, PCR: NEGATIVE
Staphylococcus aureus: NEGATIVE

## 2021-02-16 LAB — TYPE AND SCREEN
ABO/RH(D): A POS
Antibody Screen: NEGATIVE

## 2021-02-16 LAB — GLUCOSE, CAPILLARY: Glucose-Capillary: >600 mg/dL (ref 70–99)

## 2021-02-16 NOTE — Progress Notes (Signed)
CRITICAL RESULT PROVIDER NOTIFICATION  Test performed and critical result:  glucose 718  Date and time result received:  10:00 AM  Provider name/title: Dr. Linna Caprice  Date and time provider notified: 10:15 AM  Date and time provider responded:   Provider response:Evaluate remotely

## 2021-02-16 NOTE — Progress Notes (Signed)
Inpatient Diabetes Program Recommendations  AACE/ADA: New Consensus Statement on Inpatient Glycemic Control  Target Ranges:  Prepandial:   less than 140 mg/dL      Peak postprandial:   less than 180 mg/dL (1-2 hours)      Critically ill patients:  140 - 180 mg/dL    Review of Glycemic Control  Diabetes history: DM1 Outpatient Diabetes medications: Insulin Pump with Lyumjev Current orders for Inpatient glycemic control: NA; was in PAT today  NOTE: Received page from preadmission testing L. Rosero, RN regarding patient with plans to have total knee surgery on 02/17/21. RN reports that patient has insulin pump for DM control and that she is scheduled to have surgery 02/17/21 from 10:30-3:30. RN reports that patient's glucose was over 600 mg/dl this morning at PAT appointment and patient stated that was not uncommon for her she just needed to give herself more insulin. Discussed importance of glucose control prior to and following surgery and that surgery may need to be postponed if glucose is that uncontrolled. Per chart review, noted patient's Endocrinologist is Dr. Kathreen Cosier and last office visit note was 09/14/20. Per office note on 09/14/20 patient uses:  Medtronic 630G- manually boluses, does not use bolus wizard Reports using a 1:10 grams carb ratio ISF 1:40>120 (I unit drops glucose 40 mg/dl; uses target glucose of 120 mg/dl) Medtronic Pump settings: Basal:  0000-0400: 0.4 0400-0900: 2.1 0900-1200: 1.1 1200-2200: 1.1 2200-0000: 1.1  Discussed that patient needs to reach out to Endocrinologist for specific instructions on any changes with insulin pump settings. Also discussed protocol to decrease basal rates by 20% if no specific instructions from Endocrinologist. Given glucose over 600 mg/dl at PAT, may need to postpone surgery until DM is better controlled. If surgery is continued as planned, recommend to use IV insulin since surgery will be over 2 hours. Therefore, patient would need to  keep insulin pump on until IV insulin is started prior to surgery. Once IV insulin started the pump would be removed and IV insulin used until after procedure.  Once patient awake and alert after procedure, could consider transitioning from IV insulin back to insulin pump. However, if glucose is not well controlled with insulin pump, may need to consider using SQ insulin at transition. RN has communicated with Surgery PA regarding patient and glucose over 600 mg/dl.  Thanks, Orlando Penner, RN, MSN, CDE Diabetes Coordinator Inpatient Diabetes Program [phone] (Team Pager from 8am to 5pm)

## 2021-02-16 NOTE — Progress Notes (Signed)
Lab. Results: Sodium: 130 Potassium: 6.0

## 2021-02-16 NOTE — Progress Notes (Signed)
COVID Vaccine Completed: Yes Date COVID Vaccine completed: 11/01/19 COVID vaccine manufacturer: Pfizer x 3 COVID test: N/A. Pt. Tested positive for COVID two weeks ago at home. PCP - Park Meo: PA-C Cardiologist -   Chest x-ray -  EKG - 11/11/20 Stress Test -  ECHO - 10/16/12 Cardiac Cath -  Pacemaker/ICD device last checked:  Sleep Study -  CPAP -   Fasting Blood Sugar - 140's - 200's Checks Blood Sugar __6___ times a day  Blood Thinner Instructions: Aspirin Instructions: Last Dose:  Anesthesia review: HTN,DIA type 1.Insulin pump  Patient denies shortness of breath, fever, cough and chest pain at PAT appointment   Patient verbalized understanding of instructions that were given to them at the PAT appointment. Patient was also instructed that they will need to review over the PAT instructions again at home before surgery.

## 2021-02-16 NOTE — Progress Notes (Addendum)
Covid test was cancelled,as per pt. She tested positive for COVID at home two weeks ago. Pt's CBG > 600. As per pt. She just need to give herself more insulin on the insulin pump.Pt. was advised to contact endocrinologist and to check in the emergency room.Pt. refused twice to go to the ED,she verbalized that she can manage this situation herself and that she knows what to do.RN also mention to the pt. The chance of cancellation of her surgery do to the high blood sugar,pt. Said her blood sugar will be fine the day of surgery.Elijio Miles PA and diabetes coordinators were notified about this situation.

## 2021-02-17 NOTE — Progress Notes (Signed)
Lab. Results: A1C: 8.4 

## 2021-02-18 ENCOUNTER — Encounter (HOSPITAL_COMMUNITY): Admission: RE | Payer: Self-pay | Source: Ambulatory Visit

## 2021-02-18 ENCOUNTER — Inpatient Hospital Stay (HOSPITAL_COMMUNITY): Admission: RE | Admit: 2021-02-18 | Payer: 59 | Source: Ambulatory Visit | Admitting: Orthopedic Surgery

## 2021-02-18 SURGERY — TOTAL KNEE REVISION
Anesthesia: Spinal | Site: Knee | Laterality: Right

## 2021-02-22 ENCOUNTER — Encounter (HOSPITAL_COMMUNITY): Payer: 59

## 2021-03-17 DIAGNOSIS — M25561 Pain in right knee: Secondary | ICD-10-CM | POA: Diagnosis not present

## 2021-03-19 NOTE — Progress Notes (Signed)
Sent message, via epic in basket, requesting orders in epic from surgeon.  

## 2021-03-21 ENCOUNTER — Ambulatory Visit: Payer: Self-pay | Admitting: Orthopedic Surgery

## 2021-03-21 DIAGNOSIS — E118 Type 2 diabetes mellitus with unspecified complications: Secondary | ICD-10-CM

## 2021-03-24 DIAGNOSIS — M25561 Pain in right knee: Secondary | ICD-10-CM | POA: Diagnosis not present

## 2021-03-24 NOTE — Patient Instructions (Addendum)
DUE TO COVID-19 ONLY ONE VISITOR IS ALLOWED TO COME WITH YOU AND STAY IN THE WAITING ROOM ONLY DURING PRE OP AND PROCEDURE.   **NO VISITORS ARE ALLOWED IN THE SHORT STAY AREA OR RECOVERY ROOM!!**  IF YOU WILL BE ADMITTED INTO THE HOSPITAL YOU ARE ALLOWED ONLY TWO SUPPORT PEOPLE DURING VISITATION HOURS ONLY (7 AM -8PM)   The support person(s) must pass our screening, gel in and out, and wear a mask at all times, including in the patients room. Patients must also wear a mask when staff or their support person are in the room. Visitors GUEST BADGE MUST BE WORN VISIBLY  One adult visitor may remain with you overnight and MUST be in the room by 8 P.M.  No visitors under the age of 52. Any visitor under the age of 52 must be accompanied by an adult.     Your procedure is scheduled on: 03/31/21   [facility] Main [facility]    Report to admitting at: 12:15 PM    Call this number if you have problems the morning of surgery [phone]   Do not eat food :After Midnight.   After Midnight you may have the following liquids until : 11:30 AM DAY OF SURGERY  Water Black Coffee (sugar ok, NO MILK/CREAM OR CREAMERS)  Tea (sugar ok, NO MILK/CREAM OR CREAMERS) regular and decaf                             Plain Jell-O (NO RED)                                           Fruit ices (not with fruit pulp, NO RED)                                     Popsicles (NO RED)                                                                  Juice: apple, WHITE grape, WHITE cranberry Sports drinks like Gatorade (NO RED) Clear broth(vegetable,chicken,beef)  Drink G2 drinks AT 11:30 AM the day of surgery.      The day of surgery:  Drink ONE (1) Pre-Surgery Clear Ensure or G2 at AM the morning of surgery. Drink in one sitting. Do not sip.  This drink was given to you during your hospital  pre-op appointment visit. Nothing else to drink after completing the  Pre-Surgery Clear Ensure or G2.           If you have questions, please contact your surgeons office.  FOLLOW BOWEL PREP AND ANY ADDITIONAL PRE OP INSTRUCTIONS YOU RECEIVED FROM YOUR SURGEON'S OFFICE!!    Oral Hygiene is also important to reduce your risk of infection.                                    Remember - BRUSH YOUR TEETH THE MORNING OF SURGERY WITH  YOUR REGULAR TOOTHPASTE   Do NOT smoke after Midnight   Take these medicines the morning of surgery with A SIP OF WATER: tegretol,lamotrigine,AMLODIPINE,LORATADINE.USE INHALERS AS USUAL.FAMOTIDINE AND NURTEC AS NEEDED. How to Manage Your Diabetes Before and After Surgery  Why is it important to control my blood sugar before and after surgery? Improving blood sugar levels before and after surgery helps healing and can limit problems. A way of improving blood sugar control is eating a healthy diet by:  Eating less sugar and carbohydrates  Increasing activity/exercise  Talking with your doctor about reaching your blood sugar goals High blood sugars (greater than 180 mg/dL) can raise your risk of infections and slow your recovery, so you will need to focus on controlling your diabetes during the weeks before surgery. Make sure that the doctor who takes care of your diabetes knows about your planned surgery including the date and location.  How do I manage my blood sugar before surgery? Check your blood sugar at least 4 times a day, starting 2 days before surgery, to make sure that the level is not too high or low. Check your blood sugar the morning of your surgery when you wake up and every 2 hours until you get to the Short Stay unit. If your blood sugar is less than 70 mg/dL, you will need to treat for low blood sugar: Do not take insulin. Treat a low blood sugar (less than 70 mg/dL) with  cup of clear juice (cranberry or apple), 4 glucose tablets, OR glucose gel. Recheck blood sugar in 15 minutes after treatment (to make sure it is greater than 70 mg/dL). If your blood  sugar is not greater than 70 mg/dL on recheck, call [phone] for further instructions. Report your blood sugar to the short stay nurse when you get to Short Stay.  If you are admitted to the hospital after surgery: Your blood sugar will be checked by the staff and you will probably be given insulin after surgery (instead of oral diabetes medicines) to make sure you have good blood sugar levels. The goal for blood sugar control after surgery is 80-180 mg/dL.   WHAT DO I DO ABOUT MY DIABETES MEDICATION? For patients with insulin pumps: Contact your diabetes doctor for specific instructions before surgery. Decrease basal rates by 20% at midnight the night before your surgery. Note that if your surgery is planned to be longer than 2 hours, your insulin pump will be removed and intravenous (IV) insulin will be started and managed by the nurses and the anesthesiologist. You will be able to restart your insulin pump once you are awake and able to manage it.  Make sure to bring insulin pump supplies to the hospital with you in case the  site needs to be changed.  Patient Signature:  Date:   Nurse Signature:  Date:   Reviewed and Endorsed by [facility] Patient Education Committee, August 2015   DO NOT TAKE ANY ORAL DIABETIC MEDICATIONS DAY OF YOUR SURGERY                              You may not have any metal on your body including hair pins, jewelry, and body piercing             Do not wear make-up, lotions, powders, perfumes/cologne, or deodorant  Do not wear nail polish including gel and S&S, artificial/acrylic nails, or any other type of covering on natural nails  including finger and toenails. If you have artificial nails, gel coating, etc. that needs to be removed by a nail salon please have this removed prior to surgery or surgery may need to be canceled/ delayed if the surgeon/ anesthesia feels like they are unable to be safely monitored.   Do not shave  48 hours prior to surgery.                Men may shave face and neck.   Do not bring valuables to the hospital. [facility] IS NOT             RESPONSIBLE   FOR VALUABLES.   Contacts, dentures or bridgework may not be worn into surgery.   Bring small overnight bag day of surgery.    Patients discharged on the day of surgery will not be allowed to drive home.  Someone NEEDS to stay with you for the first 24 hours after anesthesia.   Special Instructions: Bring a copy of your healthcare power of attorney and living will documents         the day of surgery if you haven't scanned them before.              Please read over the following fact sheets you were given: IF YOU HAVE QUESTIONS ABOUT YOUR PRE-OP INSTRUCTIONS PLEASE CALL [phone]     [facility] - Preparing for Surgery Before surgery, you can play an important role.  Because skin is not sterile, your skin needs to be as free of germs as possible.  You can reduce the number of germs on your skin by washing with CHG (chlorahexidine gluconate) soap before surgery.  CHG is an antiseptic cleaner which kills germs and bonds with the skin to continue killing germs even after washing. Please DO NOT use if you have an allergy to CHG or antibacterial soaps.  If your skin becomes reddened/irritated stop using the CHG and inform your nurse when you arrive at Short Stay. Do not shave (including legs and underarms) for at least 48 hours prior to the first CHG shower.  You may shave your face/neck. Please follow these instructions carefully:  1.  Shower with CHG Soap the night before surgery and the  morning of Surgery.  2.  If you choose to wash your hair, wash your hair first as usual with your  normal  shampoo.  3.  After you shampoo, rinse your hair and body thoroughly to remove the  shampoo.                           4.  Use CHG as you would any other liquid soap.  You can apply chg directly  to the skin and wash                       Gently with a scrungie or clean  washcloth.  5.  Apply the CHG Soap to your body ONLY FROM THE NECK DOWN.   Do not use on face/ open                           Wound or open sores. Avoid contact with eyes, ears mouth and genitals (private parts).                       Wash face,  Genitals (private parts) with your normal  soap.             6.  Wash thoroughly, paying special attention to the area where your surgery  will be performed.  7.  Thoroughly rinse your body with warm water from the neck down.  8.  DO NOT shower/wash with your normal soap after using and rinsing off  the CHG Soap.                9.  Pat yourself dry with a clean towel.            10.  Wear clean pajamas.            11.  Place clean sheets on your bed the night of your first shower and do not  sleep with pets. Day of Surgery : Do not apply any lotions/deodorants the morning of surgery.  Please wear clean clothes to the hospital/surgery center.  FAILURE TO FOLLOW THESE INSTRUCTIONS MAY RESULT IN THE CANCELLATION OF YOUR SURGERY PATIENT SIGNATURE_________________________________  NURSE SIGNATURE__________________________________  ________________________________________________________________________   Molly [name]  An incentive spirometer is a tool that can help keep your lungs clear and active. This tool measures how well you are filling your lungs with each breath. Taking long deep breaths may help reverse or decrease the chance of developing breathing (pulmonary) problems (especially infection) following: A long period of time when you are unable to move or be active. BEFORE THE PROCEDURE  If the spirometer includes an indicator to show your best effort, your nurse or respiratory therapist will set it to a desired goal. If possible, sit up straight or lean slightly forward. Try not to slouch. Hold the incentive spirometer in an upright position. INSTRUCTIONS FOR USE  Sit on the edge of your bed if possible, or sit up as far as you can in  bed or on a chair. Hold the incentive spirometer in an upright position. Breathe out normally. Place the mouthpiece in your mouth and seal your lips tightly around it. Breathe in slowly and as deeply as possible, raising the piston or the ball toward the top of the column. Hold your breath for 3-5 seconds or for as long as possible. Allow the piston or ball to fall to the bottom of the column. Remove the mouthpiece from your mouth and breathe out normally. Rest for a few seconds and repeat Steps 1 through 7 at least 10 times every 1-2 hours when you are awake. Take your time and take a few normal breaths between deep breaths. The spirometer may include an indicator to show your best effort. Use the indicator as a goal to work toward during each repetition. After each set of 10 deep breaths, practice coughing to be sure your lungs are clear. If you have an incision (the cut made at the time of surgery), support your incision when coughing by placing a pillow or rolled up towels firmly against it. Once you are able to get out of bed, walk around indoors and cough well. You may stop using the incentive spirometer when instructed by your caregiver.  RISKS AND COMPLICATIONS Take your time so you do not get dizzy or light-headed. If you are in pain, you may need to take or ask for pain medication before doing incentive spirometry. It is harder to take a deep breath if you are having pain. AFTER USE Rest and breathe slowly and easily. It can be helpful to keep track of a log of your progress. Your caregiver can provide  you with a simple table to help with this. If you are using the spirometer at home, follow these instructions: SEEK MEDICAL CARE IF:  You are having difficultly using the spirometer. You have trouble using the spirometer as often as instructed. Your pain medication is not giving enough relief while using the spirometer. You develop fever of 100.5 F (38.1 C) or higher. SEEK IMMEDIATE  MEDICAL CARE IF:  You cough up bloody sputum that had not been present before. You develop fever of 102 F (38.9 C) or greater. You develop worsening pain at or near the incision site. MAKE SURE YOU:  Understand these instructions. Will watch your condition. Will get help right away if you are not doing well or get worse. Document Released: 05/23/2006 Document Revised: 04/04/2011 Document Reviewed: 07/24/2006 [facility] Patient Information 2014 [place], [place].   ________________________________________________________________________

## 2021-03-25 ENCOUNTER — Other Ambulatory Visit: Payer: Self-pay

## 2021-03-25 ENCOUNTER — Encounter (HOSPITAL_COMMUNITY)
Admission: RE | Admit: 2021-03-25 | Discharge: 2021-03-25 | Disposition: A | Discharge status: Home / Self Care | Payer: Managed Care, Other (non HMO) | Source: Ambulatory Visit | Attending: Orthopedic Surgery | Admitting: Orthopedic Surgery

## 2021-03-25 ENCOUNTER — Encounter (HOSPITAL_COMMUNITY): Payer: Self-pay

## 2021-03-25 VITALS — BP 125/64 | HR 90 | Temp 98.4°F | Resp 16 | Ht 66.5 in | Wt 149.4 lb

## 2021-03-25 DIAGNOSIS — Z01818 Encounter for other preprocedural examination: Secondary | ICD-10-CM

## 2021-03-25 DIAGNOSIS — Z01812 Encounter for preprocedural laboratory examination: Secondary | ICD-10-CM | POA: Diagnosis not present

## 2021-03-25 DIAGNOSIS — Z794 Long term (current) use of insulin: Secondary | ICD-10-CM | POA: Insufficient documentation

## 2021-03-25 DIAGNOSIS — E118 Type 2 diabetes mellitus with unspecified complications: Secondary | ICD-10-CM | POA: Insufficient documentation

## 2021-03-25 DIAGNOSIS — Z9641 Presence of insulin pump (external) (internal): Secondary | ICD-10-CM | POA: Diagnosis not present

## 2021-03-25 DIAGNOSIS — E1069 Type 1 diabetes mellitus with other specified complication: Secondary | ICD-10-CM

## 2021-03-25 LAB — BASIC METABOLIC PANEL
Anion gap: 8 (ref 5–15)
BUN: 16 mg/dL (ref 6–20)
CO2: 27 mmol/L (ref 22–32)
Calcium: 9.2 mg/dL (ref 8.9–10.3)
Chloride: 101 mmol/L (ref 98–111)
Creatinine, Ser: 0.68 mg/dL (ref 0.44–1.00)
GFR, Estimated: >60 mL/min (ref >60)
Glucose, Bld: 179 mg/dL — ABNORMAL HIGH (ref 70–99)
Potassium: 4.6 mmol/L (ref 3.5–5.1)
Sodium: 136 mmol/L (ref 135–145)

## 2021-03-25 LAB — CBC
HCT: 41.9 % (ref 36.0–46.0)
Hemoglobin: 13.4 g/dL (ref 12.0–15.0)
MCH: 31.4 pg (ref 26.0–34.0)
MCHC: 32 g/dL (ref 30.0–36.0)
MCV: 98.1 fL (ref 80.0–100.0)
Platelets: 200 10*3/uL (ref 150–400)
RBC: 4.27 MIL/uL (ref 3.87–5.11)
RDW: 12.5 % (ref 11.5–15.5)
WBC: 3.6 10*3/uL — ABNORMAL LOW (ref 4.0–10.5)
nRBC: 0 % (ref 0.0–0.2)

## 2021-03-25 LAB — GLUCOSE, CAPILLARY: Glucose-Capillary: 191 mg/dL — ABNORMAL HIGH (ref 70–99)

## 2021-03-25 LAB — SURGICAL PCR SCREEN
MRSA, PCR: NEGATIVE
Staphylococcus aureus: NEGATIVE

## 2021-03-25 LAB — HEMOGLOBIN A1C
Hgb A1c MFr Bld: 6.5 % — ABNORMAL HIGH (ref 4.8–5.6)
Mean Plasma Glucose: 139.85 mg/dL

## 2021-03-25 NOTE — Progress Notes (Signed)
For Short Stay: ?COVID SWAB appointment date: N/A. COVID + Test on: 02/02/21 ?Date of COVID positive in last 90 days: 02/02/21 ?COVID Vaccine: Pfizer x 3: 11/01/19 ?Bowel Prep reminder: ? ? ?For Anesthesia: ?PCP - PAC: Heather Roberts ?Cardiologist -  ? ?Chest x-ray -  ?EKG - 11/11/20 ?Stress Test -  ?ECHO -  ?Cardiac Cath -  ?Pacemaker/ICD device last checked: ?Pacemaker orders received: ?Device Rep notified: ? ?Spinal Cord Stimulator: ? ?Sleep Study -  ?CPAP -  ? ?Fasting Blood Sugar - 140's - 200's ?Checks Blood Sugar __6___ times a day ?Date and result of last Hgb A1c- ? ?Blood Thinner Instructions: ?Aspirin Instructions: ?Last Dose: ? ?Activity level: Can go up a flight of stairs and activities of daily living without stopping and without chest pain and/or shortness of breath ?  Able to exercise without chest pain and/or shortness of breath ?  Unable to go up a flight of stairs without chest pain and/or shortness of breath ?   ? ?Anesthesia review: Hx: DIA !,HTN ? ?Patient denies shortness of breath, fever, cough and chest pain at PAT appointment ? ? ?Patient verbalized understanding of instructions that were given to them at the PAT appointment. Patient was also instructed that they will need to review over the PAT instructions again at home before surgery.  ?

## 2021-03-26 ENCOUNTER — Other Ambulatory Visit (HOSPITAL_COMMUNITY): Payer: Managed Care, Other (non HMO)

## 2021-03-26 DIAGNOSIS — M25561 Pain in right knee: Secondary | ICD-10-CM | POA: Diagnosis not present

## 2021-03-30 DIAGNOSIS — M25561 Pain in right knee: Secondary | ICD-10-CM | POA: Diagnosis not present

## 2021-03-31 ENCOUNTER — Encounter (HOSPITAL_COMMUNITY): Payer: Self-pay | Admitting: Orthopedic Surgery

## 2021-03-31 ENCOUNTER — Other Ambulatory Visit: Payer: Self-pay

## 2021-03-31 ENCOUNTER — Inpatient Hospital Stay (HOSPITAL_COMMUNITY)
Admission: RE | Admit: 2021-03-31 | Discharge: 2021-04-04 | DRG: 468 | Disposition: A | Discharge status: Home / Self Care | Payer: Managed Care, Other (non HMO) | Attending: Orthopedic Surgery | Admitting: Orthopedic Surgery

## 2021-03-31 ENCOUNTER — Inpatient Hospital Stay (HOSPITAL_COMMUNITY): Payer: Managed Care, Other (non HMO) | Admitting: Certified Registered"

## 2021-03-31 ENCOUNTER — Inpatient Hospital Stay (HOSPITAL_COMMUNITY): Payer: Managed Care, Other (non HMO) | Admitting: Physician Assistant

## 2021-03-31 ENCOUNTER — Inpatient Hospital Stay (HOSPITAL_COMMUNITY): Payer: Managed Care, Other (non HMO)

## 2021-03-31 ENCOUNTER — Encounter (HOSPITAL_COMMUNITY)
Admission: RE | Disposition: A | Discharge status: Home / Self Care | Payer: Self-pay | Source: Home / Self Care | Attending: Orthopedic Surgery

## 2021-03-31 DIAGNOSIS — M00061 Staphylococcal arthritis, right knee: Secondary | ICD-10-CM | POA: Diagnosis not present

## 2021-03-31 DIAGNOSIS — Z794 Long term (current) use of insulin: Secondary | ICD-10-CM

## 2021-03-31 DIAGNOSIS — I10 Essential (primary) hypertension: Secondary | ICD-10-CM | POA: Diagnosis not present

## 2021-03-31 DIAGNOSIS — Z01818 Encounter for other preprocedural examination: Principal | ICD-10-CM

## 2021-03-31 DIAGNOSIS — F319 Bipolar disorder, unspecified: Secondary | ICD-10-CM | POA: Diagnosis present

## 2021-03-31 DIAGNOSIS — M00861 Arthritis due to other bacteria, right knee: Secondary | ICD-10-CM | POA: Diagnosis present

## 2021-03-31 DIAGNOSIS — B9561 Methicillin susceptible Staphylococcus aureus infection as the cause of diseases classified elsewhere: Secondary | ICD-10-CM | POA: Diagnosis present

## 2021-03-31 DIAGNOSIS — E7800 Pure hypercholesterolemia, unspecified: Secondary | ICD-10-CM | POA: Diagnosis present

## 2021-03-31 DIAGNOSIS — Z800 Family history of malignant neoplasm of digestive organs: Secondary | ICD-10-CM | POA: Diagnosis not present

## 2021-03-31 DIAGNOSIS — Z9851 Tubal ligation status: Secondary | ICD-10-CM

## 2021-03-31 DIAGNOSIS — M1711 Unilateral primary osteoarthritis, right knee: Secondary | ICD-10-CM | POA: Diagnosis present

## 2021-03-31 DIAGNOSIS — E119 Type 2 diabetes mellitus without complications: Secondary | ICD-10-CM

## 2021-03-31 DIAGNOSIS — Z888 Allergy status to other drugs, medicaments and biological substances status: Secondary | ICD-10-CM | POA: Diagnosis not present

## 2021-03-31 DIAGNOSIS — Z8249 Family history of ischemic heart disease and other diseases of the circulatory system: Secondary | ICD-10-CM | POA: Diagnosis not present

## 2021-03-31 DIAGNOSIS — E1065 Type 1 diabetes mellitus with hyperglycemia: Secondary | ICD-10-CM | POA: Diagnosis present

## 2021-03-31 DIAGNOSIS — Z87891 Personal history of nicotine dependence: Secondary | ICD-10-CM

## 2021-03-31 DIAGNOSIS — Z833 Family history of diabetes mellitus: Secondary | ICD-10-CM

## 2021-03-31 DIAGNOSIS — G43909 Migraine, unspecified, not intractable, without status migrainosus: Secondary | ICD-10-CM | POA: Diagnosis present

## 2021-03-31 DIAGNOSIS — M009 Pyogenic arthritis, unspecified: Secondary | ICD-10-CM | POA: Diagnosis present

## 2021-03-31 DIAGNOSIS — M810 Age-related osteoporosis without current pathological fracture: Secondary | ICD-10-CM | POA: Diagnosis present

## 2021-03-31 DIAGNOSIS — E10319 Type 1 diabetes mellitus with unspecified diabetic retinopathy without macular edema: Secondary | ICD-10-CM | POA: Diagnosis present

## 2021-03-31 DIAGNOSIS — Z20822 Contact with and (suspected) exposure to covid-19: Secondary | ICD-10-CM | POA: Diagnosis present

## 2021-03-31 DIAGNOSIS — Z79899 Other long term (current) drug therapy: Secondary | ICD-10-CM

## 2021-03-31 DIAGNOSIS — Z96651 Presence of right artificial knee joint: Secondary | ICD-10-CM

## 2021-03-31 HISTORY — PX: TOTAL KNEE REVISION: SHX996

## 2021-03-31 LAB — GLUCOSE, CAPILLARY
Glucose-Capillary: 70 mg/dL (ref 70–99)
Glucose-Capillary: 83 mg/dL (ref 70–99)
Glucose-Capillary: 68 mg/dL — ABNORMAL LOW (ref 70–99)
Glucose-Capillary: 87 mg/dL (ref 70–99)
Glucose-Capillary: 97 mg/dL (ref 70–99)
Glucose-Capillary: 119 mg/dL — ABNORMAL HIGH (ref 70–99)
Glucose-Capillary: 136 mg/dL — ABNORMAL HIGH (ref 70–99)
Glucose-Capillary: 133 mg/dL — ABNORMAL HIGH (ref 70–99)
Glucose-Capillary: 195 mg/dL — ABNORMAL HIGH (ref 70–99)
Glucose-Capillary: 208 mg/dL — ABNORMAL HIGH (ref 70–99)

## 2021-03-31 LAB — TYPE AND SCREEN
ABO/RH(D): A POS
Antibody Screen: NEGATIVE

## 2021-03-31 SURGERY — TOTAL KNEE REVISION
Anesthesia: Spinal | Site: Knee | Laterality: Right

## 2021-03-31 MED ORDER — CARBAMAZEPINE 200 MG PO TABS
1200.0000 mg | ORAL_TABLET | Freq: Every day | ORAL | Status: DC
  Administered 2021-04-01 – 2021-04-03 (×3): 1200 mg via ORAL
  Filled 2021-03-31 (×3): qty 6

## 2021-03-31 MED ORDER — MIDAZOLAM HCL 2 MG/2ML IJ SOLN
1.0000 mg | INTRAMUSCULAR | Status: AC
  Administered 2021-03-31: 1 mg via INTRAVENOUS
  Filled 2021-03-31: qty 2

## 2021-03-31 MED ORDER — METHOCARBAMOL 500 MG PO TABS
500.0000 mg | ORAL_TABLET | Freq: Four times a day (QID) | ORAL | Status: DC | PRN
  Administered 2021-04-01 – 2021-04-03 (×7): 500 mg via ORAL
  Filled 2021-03-31 (×8): qty 1

## 2021-03-31 MED ORDER — BUPIVACAINE HCL (PF) 0.5 % IJ SOLN
INTRAMUSCULAR | Status: DC | PRN
  Administered 2021-03-31: 2.2 mL via INTRATHECAL

## 2021-03-31 MED ORDER — LAMOTRIGINE 100 MG PO TABS
400.0000 mg | ORAL_TABLET | Freq: Every day | ORAL | Status: DC
Start: 2021-04-01 — End: 2021-04-04
  Administered 2021-04-01 – 2021-04-04 (×4): 400 mg via ORAL
  Filled 2021-03-31 (×5): qty 4

## 2021-03-31 MED ORDER — POLYETHYLENE GLYCOL 3350 17 G PO PACK
17.0000 g | PACK | Freq: Every day | ORAL | Status: DC | PRN
  Administered 2021-04-01: 21:00:00 17 g via ORAL
  Filled 2021-03-31: qty 1

## 2021-03-31 MED ORDER — METOCLOPRAMIDE HCL 5 MG/ML IJ SOLN: 5.0000 mg | Freq: Three times a day (TID) | INTRAMUSCULAR | Status: DC | PRN

## 2021-03-31 MED ORDER — PROPOFOL 1000 MG/100ML IV EMUL
INTRAVENOUS | Status: AC
  Filled 2021-03-31: qty 100

## 2021-03-31 MED ORDER — OXYCODONE HCL 5 MG PO TABS
10.0000 mg | ORAL_TABLET | ORAL | Status: DC | PRN
  Administered 2021-04-01: 02:00:00 10 mg via ORAL
  Administered 2021-04-01: 08:00:00 15 mg via ORAL
  Filled 2021-03-31: qty 3

## 2021-03-31 MED ORDER — ONDANSETRON HCL 4 MG/2ML IJ SOLN
INTRAMUSCULAR | Status: DC | PRN
  Administered 2021-03-31: 4 mg via INTRAVENOUS

## 2021-03-31 MED ORDER — EPHEDRINE SULFATE-NACL 50-0.9 MG/10ML-% IV SOSY
PREFILLED_SYRINGE | INTRAVENOUS | Status: DC | PRN
  Administered 2021-03-31: 5 mg via INTRAVENOUS

## 2021-03-31 MED ORDER — CEFAZOLIN SODIUM-DEXTROSE 2-4 GM/100ML-% IV SOLN
2.0000 g | INTRAVENOUS | Status: AC
  Administered 2021-03-31: 2 g via INTRAVENOUS
  Filled 2021-03-31: qty 100

## 2021-03-31 MED ORDER — LORATADINE 10 MG PO TABS
10.0000 mg | ORAL_TABLET | Freq: Every day | ORAL | Status: DC
  Administered 2021-04-01 – 2021-04-04 (×4): 10 mg via ORAL
  Filled 2021-03-31 (×4): qty 1

## 2021-03-31 MED ORDER — ONDANSETRON HCL 4 MG/2ML IJ SOLN
INTRAMUSCULAR | Status: AC
  Filled 2021-03-31: qty 2

## 2021-03-31 MED ORDER — ALUM & MAG HYDROXIDE-SIMETH 200-200-20 MG/5ML PO SUSP
30.0000 mL | ORAL | Status: DC | PRN
  Administered 2021-04-02: 30 mL via ORAL
  Filled 2021-03-31: qty 30

## 2021-03-31 MED ORDER — SENNA 8.6 MG PO TABS
1.0000 | ORAL_TABLET | Freq: Two times a day (BID) | ORAL | Status: DC
  Administered 2021-04-01 – 2021-04-03 (×6): 8.6 mg via ORAL
  Filled 2021-03-31 (×7): qty 1

## 2021-03-31 MED ORDER — PROPOFOL 500 MG/50ML IV EMUL
INTRAVENOUS | Status: DC | PRN
Start: 2021-03-31 — End: 2021-03-31
  Administered 2021-03-31: 75 ug/kg/min via INTRAVENOUS

## 2021-03-31 MED ORDER — METHOCARBAMOL 1000 MG/10ML IJ SOLN
500.0000 mg | Freq: Four times a day (QID) | INTRAVENOUS | Status: DC | PRN
  Filled 2021-03-31: qty 5

## 2021-03-31 MED ORDER — MENTHOL 3 MG MT LOZG: 1.0000 | LOZENGE | OROMUCOSAL | Status: DC | PRN

## 2021-03-31 MED ORDER — DIPHENHYDRAMINE HCL 12.5 MG/5ML PO ELIX
12.5000 mg | ORAL_SOLUTION | ORAL | Status: DC | PRN
  Administered 2021-04-01: 21:00:00 25 mg via ORAL
  Filled 2021-03-31 (×2): qty 10

## 2021-03-31 MED ORDER — SODIUM CHLORIDE 0.9 % IV SOLN: INTRAVENOUS | Status: DC

## 2021-03-31 MED ORDER — PROMETHAZINE HCL 25 MG/ML IJ SOLN
6.2500 mg | INTRAMUSCULAR | Status: AC | PRN
  Administered 2021-03-31 (×2): 6.25 mg via INTRAVENOUS

## 2021-03-31 MED ORDER — MIDAZOLAM HCL 2 MG/2ML IJ SOLN
INTRAMUSCULAR | Status: AC
  Filled 2021-03-31: qty 2

## 2021-03-31 MED ORDER — OXYCODONE HCL 5 MG PO TABS
5.0000 mg | ORAL_TABLET | ORAL | Status: DC | PRN
  Filled 2021-03-31: qty 2

## 2021-03-31 MED ORDER — VANCOMYCIN HCL IN DEXTROSE 1-5 GM/200ML-% IV SOLN
1000.0000 mg | INTRAVENOUS | Status: AC
  Administered 2021-03-31: 1000 mg via INTRAVENOUS
  Filled 2021-03-31: qty 200

## 2021-03-31 MED ORDER — KETOROLAC TROMETHAMINE 30 MG/ML IJ SOLN
INTRAMUSCULAR | Status: DC | PRN
  Administered 2021-03-31: 30 mg via INTRAVENOUS

## 2021-03-31 MED ORDER — EPHEDRINE 5 MG/ML INJ
INTRAVENOUS | Status: AC
  Filled 2021-03-31: qty 5

## 2021-03-31 MED ORDER — DEXAMETHASONE SODIUM PHOSPHATE 10 MG/ML IJ SOLN
INTRAMUSCULAR | Status: DC | PRN
  Administered 2021-03-31: 4 mg via INTRAVENOUS

## 2021-03-31 MED ORDER — BUDESONIDE 0.25 MG/2ML IN SUSP
0.2500 mg | Freq: Two times a day (BID) | RESPIRATORY_TRACT | Status: DC
  Administered 2021-04-01 – 2021-04-04 (×7): 0.25 mg via RESPIRATORY_TRACT
  Filled 2021-03-31 (×7): qty 2

## 2021-03-31 MED ORDER — DEXTROSE 50 % IV SOLN
25.0000 mL | Freq: Once | INTRAVENOUS | Status: AC
  Administered 2021-03-31: 25 mL via INTRAVENOUS

## 2021-03-31 MED ORDER — FENTANYL CITRATE (PF) 100 MCG/2ML IJ SOLN
INTRAMUSCULAR | Status: DC | PRN
Start: 2021-03-31 — End: 2021-03-31
  Administered 2021-03-31 (×2): 25 ug via INTRAVENOUS

## 2021-03-31 MED ORDER — TRANEXAMIC ACID-NACL 1000-0.7 MG/100ML-% IV SOLN
1000.0000 mg | INTRAVENOUS | Status: AC
  Administered 2021-03-31: 1000 mg via INTRAVENOUS
  Filled 2021-03-31: qty 100

## 2021-03-31 MED ORDER — PROPOFOL 10 MG/ML IV BOLUS
INTRAVENOUS | Status: AC
Start: 2021-03-31 — End: ?
  Filled 2021-03-31: qty 20

## 2021-03-31 MED ORDER — HYDROMORPHONE HCL 1 MG/ML IJ SOLN
0.5000 mg | INTRAMUSCULAR | Status: DC | PRN
  Administered 2021-04-01 (×2): 1 mg via INTRAVENOUS
  Filled 2021-03-31 (×2): qty 1

## 2021-03-31 MED ORDER — HYDROMORPHONE HCL 1 MG/ML IJ SOLN: 0.2500 mg | INTRAMUSCULAR | Status: DC | PRN

## 2021-03-31 MED ORDER — INSULIN ASPART 100 UNIT/ML IJ SOLN
0.0000 [IU] | Freq: Three times a day (TID) | INTRAMUSCULAR | Status: DC
  Administered 2021-04-01: 08:00:00 3 [IU] via SUBCUTANEOUS

## 2021-03-31 MED ORDER — ONDANSETRON HCL 4 MG PO TABS: 4.0000 mg | ORAL_TABLET | Freq: Four times a day (QID) | ORAL | Status: DC | PRN

## 2021-03-31 MED ORDER — CARBAMAZEPINE 200 MG PO TABS
200.0000 mg | ORAL_TABLET | Freq: Every day | ORAL | Status: DC
  Administered 2021-04-01 – 2021-04-04 (×4): 200 mg via ORAL
  Filled 2021-03-31 (×4): qty 1

## 2021-03-31 MED ORDER — POVIDONE-IODINE 10 % EX SWAB
2.0000 "application " | Freq: Once | CUTANEOUS | Status: AC
  Administered 2021-03-31: 2 via TOPICAL

## 2021-03-31 MED ORDER — PHENOL 1.4 % MT LIQD: 1.0000 | OROMUCOSAL | Status: DC | PRN

## 2021-03-31 MED ORDER — AMISULPRIDE (ANTIEMETIC) 5 MG/2ML IV SOLN
INTRAVENOUS | Status: AC
  Filled 2021-03-31: qty 2

## 2021-03-31 MED ORDER — POVIDONE-IODINE 10 % EX SWAB: 2.0000 "application " | Freq: Once | CUTANEOUS | Status: DC

## 2021-03-31 MED ORDER — LACTATED RINGERS IV SOLN: INTRAVENOUS | Status: DC

## 2021-03-31 MED ORDER — PROMETHAZINE HCL 25 MG/ML IJ SOLN
INTRAMUSCULAR | Status: AC
  Filled 2021-03-31: qty 1

## 2021-03-31 MED ORDER — ACETAMINOPHEN 500 MG PO TABS: 1000.0000 mg | ORAL_TABLET | Freq: Once | ORAL | Status: DC

## 2021-03-31 MED ORDER — ACETAMINOPHEN 325 MG PO TABS
325.0000 mg | ORAL_TABLET | Freq: Four times a day (QID) | ORAL | Status: DC | PRN
  Administered 2021-04-01 – 2021-04-02 (×2): 650 mg via ORAL
  Filled 2021-03-31 (×2): qty 2

## 2021-03-31 MED ORDER — RIMEGEPANT SULFATE 75 MG PO TBDP: 75.0000 mg | ORAL_TABLET | Freq: Every day | ORAL | Status: DC | PRN

## 2021-03-31 MED ORDER — FAMOTIDINE 20 MG PO TABS
20.0000 mg | ORAL_TABLET | Freq: Every day | ORAL | Status: DC | PRN
  Administered 2021-04-01: 09:00:00 20 mg via ORAL
  Filled 2021-03-31: qty 1

## 2021-03-31 MED ORDER — INSULIN ASPART 100 UNIT/ML IJ SOLN
0.0000 [IU] | Freq: Every day | INTRAMUSCULAR | Status: DC
  Administered 2021-04-01: 01:00:00 2 [IU] via SUBCUTANEOUS

## 2021-03-31 MED ORDER — CEFAZOLIN SODIUM-DEXTROSE 2-4 GM/100ML-% IV SOLN
2.0000 g | Freq: Four times a day (QID) | INTRAVENOUS | Status: AC
  Administered 2021-04-01 (×2): 2 g via INTRAVENOUS
  Filled 2021-03-31 (×2): qty 100

## 2021-03-31 MED ORDER — ROPIVACAINE HCL 7.5 MG/ML IJ SOLN
INTRAMUSCULAR | Status: DC | PRN
  Administered 2021-03-31: 20 mL via PERINEURAL

## 2021-03-31 MED ORDER — ROSUVASTATIN CALCIUM 20 MG PO TABS
40.0000 mg | ORAL_TABLET | Freq: Every day | ORAL | Status: DC
  Administered 2021-04-01 – 2021-04-02 (×2): 40 mg via ORAL
  Filled 2021-03-31 (×2): qty 2

## 2021-03-31 MED ORDER — MIDAZOLAM HCL 2 MG/2ML IJ SOLN: 0.5000 mg | Freq: Once | INTRAMUSCULAR | Status: DC | PRN

## 2021-03-31 MED ORDER — MEPERIDINE HCL 50 MG/ML IJ SOLN: 6.2500 mg | INTRAMUSCULAR | Status: DC | PRN

## 2021-03-31 MED ORDER — PHENYLEPHRINE HCL-NACL 20-0.9 MG/250ML-% IV SOLN
INTRAVENOUS | Status: DC | PRN
  Administered 2021-03-31: 50 ug/min via INTRAVENOUS

## 2021-03-31 MED ORDER — OXYCODONE HCL 5 MG PO TABS: 5.0000 mg | ORAL_TABLET | Freq: Once | ORAL | Status: DC | PRN

## 2021-03-31 MED ORDER — CHLORHEXIDINE GLUCONATE 0.12 % MT SOLN
15.0000 mL | Freq: Once | OROMUCOSAL | Status: AC
  Administered 2021-03-31: 15 mL via OROMUCOSAL

## 2021-03-31 MED ORDER — AMISULPRIDE (ANTIEMETIC) 5 MG/2ML IV SOLN
5.0000 mg | Freq: Once | INTRAVENOUS | Status: AC
  Administered 2021-03-31: 5 mg via INTRAVENOUS

## 2021-03-31 MED ORDER — PSEUDOEPHEDRINE HCL ER 120 MG PO TB12
120.0000 mg | ORAL_TABLET | Freq: Two times a day (BID) | ORAL | Status: DC
  Administered 2021-04-01 – 2021-04-04 (×7): 120 mg via ORAL
  Filled 2021-03-31 (×7): qty 1

## 2021-03-31 MED ORDER — AMLODIPINE BESYLATE 10 MG PO TABS
10.0000 mg | ORAL_TABLET | Freq: Every day | ORAL | Status: DC
  Administered 2021-04-01 – 2021-04-04 (×4): 10 mg via ORAL
  Filled 2021-03-31 (×4): qty 1

## 2021-03-31 MED ORDER — BUPIVACAINE-EPINEPHRINE 0.25% -1:200000 IJ SOLN
INTRAMUSCULAR | Status: DC | PRN
  Administered 2021-03-31: 30 mL

## 2021-03-31 MED ORDER — ONDANSETRON HCL 4 MG/2ML IJ SOLN
4.0000 mg | Freq: Four times a day (QID) | INTRAMUSCULAR | Status: DC | PRN
  Filled 2021-03-31: qty 2

## 2021-03-31 MED ORDER — MIDAZOLAM HCL 2 MG/2ML IJ SOLN
INTRAMUSCULAR | Status: DC | PRN
  Administered 2021-03-31: 2 mg via INTRAVENOUS

## 2021-03-31 MED ORDER — LIDOCAINE 2% (20 MG/ML) 5 ML SYRINGE
INTRAMUSCULAR | Status: DC | PRN
Start: 2021-03-31 — End: 2021-03-31
  Administered 2021-03-31: 40 mg via INTRAVENOUS

## 2021-03-31 MED ORDER — BUPIVACAINE-EPINEPHRINE (PF) 0.25% -1:200000 IJ SOLN
INTRAMUSCULAR | Status: AC
  Filled 2021-03-31: qty 30

## 2021-03-31 MED ORDER — LITHIUM CARBONATE ER 300 MG PO TBCR
300.0000 mg | EXTENDED_RELEASE_TABLET | Freq: Every day | ORAL | Status: DC
  Administered 2021-04-01 – 2021-04-03 (×3): 300 mg via ORAL
  Filled 2021-03-31 (×3): qty 1

## 2021-03-31 MED ORDER — FENTANYL CITRATE (PF) 100 MCG/2ML IJ SOLN
INTRAMUSCULAR | Status: AC
  Filled 2021-03-31: qty 2

## 2021-03-31 MED ORDER — INSULIN REGULAR(HUMAN) IN NACL 100-0.9 UT/100ML-% IV SOLN
INTRAVENOUS | Status: DC
  Administered 2021-03-31: .7 [IU]/h via INTRAVENOUS
  Filled 2021-03-31 (×2): qty 100

## 2021-03-31 MED ORDER — GLUCERNA SHAKE PO LIQD
237.0000 mL | Freq: Three times a day (TID) | ORAL | Status: DC
  Administered 2021-04-01 – 2021-04-04 (×8): 237 mL via ORAL
  Filled 2021-03-31 (×11): qty 237

## 2021-03-31 MED ORDER — ACETAMINOPHEN 500 MG PO TABS
1000.0000 mg | ORAL_TABLET | Freq: Once | ORAL | Status: AC
  Administered 2021-03-31: 1000 mg via ORAL
  Filled 2021-03-31: qty 2

## 2021-03-31 MED ORDER — LORATADINE-PSEUDOEPHEDRINE ER 10-240 MG PO TB24: 1.0000 | ORAL_TABLET | Freq: Every day | ORAL | Status: DC

## 2021-03-31 MED ORDER — KETOROLAC TROMETHAMINE 30 MG/ML IJ SOLN
INTRAMUSCULAR | Status: AC
  Filled 2021-03-31: qty 1

## 2021-03-31 MED ORDER — DEXTROSE 50 % IV SOLN
INTRAVENOUS | Status: AC
  Filled 2021-03-31: qty 50

## 2021-03-31 MED ORDER — ASPIRIN 81 MG PO CHEW
81.0000 mg | CHEWABLE_TABLET | Freq: Two times a day (BID) | ORAL | Status: DC
  Administered 2021-04-01 – 2021-04-04 (×7): 81 mg via ORAL
  Filled 2021-03-31 (×7): qty 1

## 2021-03-31 MED ORDER — DEXAMETHASONE SODIUM PHOSPHATE 10 MG/ML IJ SOLN
INTRAMUSCULAR | Status: AC
  Filled 2021-03-31: qty 1

## 2021-03-31 MED ORDER — OXYCODONE HCL 5 MG/5ML PO SOLN: 5.0000 mg | Freq: Once | ORAL | Status: DC | PRN

## 2021-03-31 MED ORDER — METOCLOPRAMIDE HCL 5 MG PO TABS: 5.0000 mg | ORAL_TABLET | Freq: Three times a day (TID) | ORAL | Status: DC | PRN

## 2021-03-31 MED ORDER — PROPOFOL 10 MG/ML IV BOLUS
INTRAVENOUS | Status: DC | PRN
  Administered 2021-03-31: 20 mg via INTRAVENOUS

## 2021-03-31 MED ORDER — FENTANYL CITRATE PF 50 MCG/ML IJ SOSY
50.0000 ug | PREFILLED_SYRINGE | INTRAMUSCULAR | Status: AC
  Administered 2021-03-31: 50 ug via INTRAVENOUS
  Filled 2021-03-31: qty 2

## 2021-03-31 MED ORDER — ORAL CARE MOUTH RINSE: 15.0000 mL | Freq: Once | OROMUCOSAL | Status: AC

## 2021-03-31 MED ORDER — CEPHALEXIN 500 MG PO CAPS
500.0000 mg | ORAL_CAPSULE | Freq: Three times a day (TID) | ORAL | Status: DC
  Administered 2021-04-01 – 2021-04-04 (×9): 500 mg via ORAL
  Filled 2021-03-31 (×9): qty 1

## 2021-03-31 MED ORDER — ZIPRASIDONE HCL 60 MG PO CAPS
120.0000 mg | ORAL_CAPSULE | Freq: Every day | ORAL | Status: DC
Start: 2021-03-31 — End: 2021-04-04
  Administered 2021-04-01 – 2021-04-03 (×4): 120 mg via ORAL
  Filled 2021-03-31 (×4): qty 2

## 2021-03-31 MED ORDER — DOCUSATE SODIUM 100 MG PO CAPS
100.0000 mg | ORAL_CAPSULE | Freq: Two times a day (BID) | ORAL | Status: DC
  Administered 2021-04-01 – 2021-04-03 (×7): 100 mg via ORAL
  Filled 2021-03-31 (×8): qty 1

## 2021-03-31 MED ORDER — SODIUM CHLORIDE (PF) 0.9 % IJ SOLN
INTRAMUSCULAR | Status: DC | PRN
  Administered 2021-03-31: 50 mL via INTRAVENOUS

## 2021-03-31 MED ORDER — ZOLPIDEM TARTRATE 5 MG PO TABS
5.0000 mg | ORAL_TABLET | Freq: Every day | ORAL | Status: DC
  Administered 2021-04-01 – 2021-04-03 (×3): 5 mg via ORAL
  Filled 2021-03-31 (×3): qty 1

## 2021-03-31 SURGICAL SUPPLY — 97 items
ADH SKN CLS APL DERMABOND .7 (GAUZE/BANDAGES/DRESSINGS) ×1
APL PRP STRL LF DISP 70% ISPRP (MISCELLANEOUS) ×2
AUG FEM 5 5+ 5 STRL KN POST (Post) ×1 IMPLANT
AUG TIB AB CONE CNTR STRL LF (Insert) ×1 IMPLANT
AUG TIB CD 10 HLF BLCK STRL RM (Joint) ×1 IMPLANT
AUG TIB HALF BLOCK CD 10 (Joint) ×3 IMPLANT
AUGMENT TIB HALF BLC CD 10 (Joint) IMPLANT
BAG COUNTER SPONGE SURGICOUNT (BAG) ×1 IMPLANT
BAG DECANTER FOR FLEXI CONT (MISCELLANEOUS) ×6 IMPLANT
BAG SPEC THK2 15X12 ZIP CLS (MISCELLANEOUS) ×1
BAG SPNG CNTER NS LX DISP (BAG) ×1
BAG SURGICOUNT SPONGE COUNTING (BAG) ×1
BAG ZIPLOCK 12X15 (MISCELLANEOUS) ×3 IMPLANT
BLADE SAW RECIPROCATING 77.5 (BLADE) ×3 IMPLANT
BLADE SAW SGTL 81X20 HD (BLADE) ×3 IMPLANT
BLADE SURG SZ10 CARB STEEL (BLADE) ×3 IMPLANT
BNDG CMPR MED 10X6 ELC LF (GAUZE/BANDAGES/DRESSINGS) ×1
BNDG ELASTIC 4X5.8 VLCR STR LF (GAUZE/BANDAGES/DRESSINGS) ×2 IMPLANT
BNDG ELASTIC 6X10 VLCR STRL LF (GAUZE/BANDAGES/DRESSINGS) ×2 IMPLANT
BNDG ELASTIC 6X5.8 VLCR STR LF (GAUZE/BANDAGES/DRESSINGS) ×3 IMPLANT
CEMENT BONE REFOBACIN R1X40 US (Cement) ×10 IMPLANT
CHLORAPREP W/TINT 26 (MISCELLANEOUS) ×6 IMPLANT
COMP FEM REV CMT 5 RT (Joint) ×3 IMPLANT
COMP TIB PERS KNEE SZ D RT (Joint) ×3 IMPLANT
COMPONENT FEM REV CMT 5 RT (Joint) IMPLANT
COMPONENT TIB PERS KN SZD RT (Joint) IMPLANT
COVER SURGICAL LIGHT HANDLE (MISCELLANEOUS) ×3 IMPLANT
CUFF TOURN SGL QUICK 34 (TOURNIQUET CUFF) ×3
CUFF TRNQT CYL 34X4.125X (TOURNIQUET CUFF) ×1 IMPLANT
DERMABOND ADVANCED (GAUZE/BANDAGES/DRESSINGS) ×2
DERMABOND ADVANCED .7 DNX12 (GAUZE/BANDAGES/DRESSINGS) ×2 IMPLANT
DRAPE INCISE IOBAN 66X45 STRL (DRAPES) ×9 IMPLANT
DRAPE SHEET LG 3/4 BI-LAMINATE (DRAPES) ×9 IMPLANT
DRAPE U-SHAPE 47X51 STRL (DRAPES) ×3 IMPLANT
DRIVER FEM NEXGEN T-HANDLE 2 (ORTHOPEDIC DISPOSABLE SUPPLIES) ×4 IMPLANT
DRSG AQUACEL AG ADV 3.5X10 (GAUZE/BANDAGES/DRESSINGS) ×3 IMPLANT
DRSG AQUACEL AG ADV 3.5X14 (GAUZE/BANDAGES/DRESSINGS) ×2 IMPLANT
DRSG TEGADERM 4X4.75 (GAUZE/BANDAGES/DRESSINGS) ×2 IMPLANT
ELECT BLADE TIP CTD 4 INCH (ELECTRODE) ×3 IMPLANT
ELECT REM PT RETURN 15FT ADLT (MISCELLANEOUS) ×3 IMPLANT
EVACUATOR 1/8 PVC DRAIN (DRAIN) ×3 IMPLANT
GLOVE SRG 8 PF TXTR STRL LF DI (GLOVE) ×1 IMPLANT
GLOVE SURG ENC MOIS LTX SZ8.5 (GLOVE) ×6 IMPLANT
GLOVE SURG ENC TEXT LTX SZ7.5 (GLOVE) ×6 IMPLANT
GLOVE SURG UNDER POLY LF SZ8 (GLOVE) ×3
GLOVE SURG UNDER POLY LF SZ8.5 (GLOVE) ×3 IMPLANT
GOWN SPEC L3 XXLG W/TWL (GOWN DISPOSABLE) ×3 IMPLANT
GOWN STRL REUS W/TWL XL LVL3 (GOWN DISPOSABLE) ×3 IMPLANT
HANDPIECE INTERPULSE COAX TIP (DISPOSABLE) ×3
HDLS TROCR DRIL PIN KNEE 75 (PIN) ×12
HOLDER FOLEY CATH W/STRAP (MISCELLANEOUS) ×2 IMPLANT
HOOD PEEL AWAY FLYTE STAYCOOL (MISCELLANEOUS) ×12 IMPLANT
IMMOBILIZER KNEE 20 (SOFTGOODS) ×3
IMMOBILIZER KNEE 20 THIGH 36 (SOFTGOODS) IMPLANT
INSERT TIB PS CMTLS CC FX (Insert) ×2 IMPLANT
INSTR SCRW HEX REV FIX 3.5X48 (ORTHOPEDIC DISPOSABLE SUPPLIES) ×6
INSTRUMENT SCRW HEX REV 3.5X48 (ORTHOPEDIC DISPOSABLE SUPPLIES) IMPLANT
JET LAVAGE IRRISEPT WOUND (IRRIGATION / IRRIGATOR)
KIT TURNOVER KIT A (KITS) ×2 IMPLANT
LAVAGE JET IRRISEPT WOUND (IRRIGATION / IRRIGATOR) IMPLANT
LINER TIB ASF PS CD/3-5 14 RT (Liner) ×2 IMPLANT
MANIFOLD NEPTUNE II (INSTRUMENTS) ×3 IMPLANT
MARKER SKIN DUAL TIP RULER LAB (MISCELLANEOUS) ×3 IMPLANT
NDL SPNL 18GX3.5 QUINCKE PK (NEEDLE) ×1 IMPLANT
NEEDLE SPNL 18GX3.5 QUINCKE PK (NEEDLE) ×3 IMPLANT
NS IRRIG 1000ML POUR BTL (IV SOLUTION) ×3 IMPLANT
PACK TOTAL KNEE CUSTOM (KITS) ×3 IMPLANT
PAD CAST 4YDX4 CTTN HI CHSV (CAST SUPPLIES) IMPLANT
PADDING CAST COTTON 4X4 STRL (CAST SUPPLIES) ×3
PADDING CAST COTTON 6X4 STRL (CAST SUPPLIES) ×2 IMPLANT
PIN DRILL HDLS TROCAR 75 4PK (PIN) IMPLANT
POST KNEE AUG PERS 5X5+ 5 (Post) ×2 IMPLANT
PROTECTOR NERVE ULNAR (MISCELLANEOUS) ×3 IMPLANT
SAW OSC TIP CART 19.5X105X1.3 (SAW) ×3 IMPLANT
SEALER BIPOLAR AQUA 6.0 (INSTRUMENTS) ×3 IMPLANT
SET HNDPC FAN SPRY TIP SCT (DISPOSABLE) ×1 IMPLANT
SET PAD KNEE POSITIONER (MISCELLANEOUS) ×3 IMPLANT
SPIKE FLUID TRANSFER (MISCELLANEOUS) ×2 IMPLANT
SPONGE DRAIN TRACH 4X4 STRL 2S (GAUZE/BANDAGES/DRESSINGS) ×3 IMPLANT
SPONGE T-LAP 18X18 ~~LOC~~+RFID (SPONGE) ×9 IMPLANT
STEM FEM OFFSET 13X135X6 (Stem) ×2 IMPLANT
STEM FEM PROS 14 +135 (Stem) ×2 IMPLANT
SUT ETHILON 2 0 PS N (SUTURE) ×2 IMPLANT
SUT MNCRL AB 3-0 PS2 18 (SUTURE) ×3 IMPLANT
SUT MON AB 2-0 CT1 36 (SUTURE) ×6 IMPLANT
SUT STRATAFIX PDO 1 14 VIOLET (SUTURE) ×3
SUT STRATFX PDO 1 14 VIOLET (SUTURE) ×1
SUT VIC AB 1 CTX 36 (SUTURE) ×6
SUT VIC AB 1 CTX36XBRD ANBCTR (SUTURE) ×2 IMPLANT
SUT VIC AB 2-0 CT1 27 (SUTURE) ×3
SUT VIC AB 2-0 CT1 TAPERPNT 27 (SUTURE) ×1 IMPLANT
SUTURE STRATFX PDO 1 14 VIOLET (SUTURE) ×1 IMPLANT
TOWER CARTRIDGE SMART MIX (DISPOSABLE) ×6 IMPLANT
TRAY FOLEY MTR SLVR 16FR STAT (SET/KITS/TRAYS/PACK) ×3 IMPLANT
TUBE SUCTION HIGH CAP CLEAR NV (SUCTIONS) ×3 IMPLANT
WATER STERILE IRR 1000ML POUR (IV SOLUTION) ×3 IMPLANT
WRAP KNEE MAXI GEL POST OP (GAUZE/BANDAGES/DRESSINGS) ×3 IMPLANT

## 2021-03-31 NOTE — Anesthesia Preprocedure Evaluation (Addendum)
Anesthesia Evaluation  ?Patient identified by MRN, date of birth, ID band ?Patient awake ? ? ? ?Reviewed: ?Allergy & Precautions, NPO status , Patient's Chart, lab work & pertinent test results ? ?History of Anesthesia Complications ?Negative for: history of anesthetic complications ? ?Airway ?Mallampati: II ? ?TM Distance: >3 FB ?Neck ROM: Full ? ? ? Dental ? ?(+) Dental Advisory Given ?  ?Pulmonary ?former smoker,  ?  ?breath sounds clear to auscultation ? ? ? ? ? ? Cardiovascular ?hypertension, Pt. on medications ?(-) angina ?Rhythm:Regular Rate:Normal ? ? ?  ?Neuro/Psych ? Headaches, Depression Bipolar Disorder   ? GI/Hepatic ?Neg liver ROS, GERD  Medicated and Controlled,  ?Endo/Other  ?diabetes (glu 83), Insulin Dependent ? Renal/GU ?negative Renal ROS  ? ?  ?Musculoskeletal ? ?(+) Arthritis , Osteoarthritis,   ? Abdominal ?  ?Peds ? Hematology ?negative hematology ROS ?(+)   ?Anesthesia Other Findings ? ? Reproductive/Obstetrics ? ?  ? ? ? ? ? ? ? ? ? ? ? ? ? ?  ?  ? ? ? ? ? ? ? ?Anesthesia Physical ?Anesthesia Plan ? ?ASA: 3 ? ?Anesthesia Plan: Spinal  ? ?Post-op Pain Management: Regional block* and Tylenol PO (pre-op)*  ? ?Induction:  ? ?PONV Risk Score and Plan: 2 and Ondansetron and Treatment may vary due to age or medical condition ? ?Airway Management Planned: Natural Airway and Simple Face Mask ? ?Additional Equipment: None ? ?Intra-op Plan:  ? ?Post-operative Plan:  ? ?Informed Consent: I have reviewed the patients History and Physical, chart, labs and discussed the procedure including the risks, benefits and alternatives for the proposed anesthesia with the patient or authorized representative who has indicated his/her understanding and acceptance.  ? ? ? ?Dental advisory given ? ?Plan Discussed with: CRNA and Surgeon ? ?Anesthesia Plan Comments: (Plan routine monitors, SAB with adductor canal block for post op analgesia)  ? ? ? ? ? ?Anesthesia Quick Evaluation ? ?

## 2021-03-31 NOTE — Discharge Instructions (Signed)
 Dr. Zacchary Pompei Total Joint Specialist [facility] [address] [place], [place] [address] [phone]  TOTAL KNEE REPLACEMENT POSTOPERATIVE DIRECTIONS    Knee Rehabilitation, Guidelines Following Surgery  Results after knee surgery are often greatly improved when you follow the exercise, range of motion and muscle strengthening exercises prescribed by your doctor. Safety measures are also important to protect the knee from further injury. Any time any of these exercises cause you to have increased pain or swelling in your knee joint, decrease the amount until you are comfortable again and slowly increase them. If you have problems or questions, call your caregiver or physical therapist for advice.   WEIGHT BEARING Weight bearing as tolerated with assist device (walker, cane, etc) as directed, use it as long as suggested by your surgeon or therapist, typically at least 4-6 weeks.  HOME CARE INSTRUCTIONS  Remove items at home which could result in a fall. This includes throw rugs or furniture in walking pathways.  Continue medications as instructed at time of discharge. You may have some home medications which will be placed on hold until you complete the course of blood thinner medication.  You may start showering once you are discharged home but do not submerge the incision under water. Just pat the incision dry and apply a dry gauze dressing on daily. Walk with walker as instructed.  You may resume a sexual relationship in one month or when given the OK by your doctor.  Use walker as long as suggested by your caregivers. Avoid periods of inactivity such as sitting longer than an hour when not asleep. This helps prevent blood clots.  You may put full weight on your legs and walk as much as is comfortable.  You may return to work once you are cleared by your doctor.  Do not drive a car for 6 weeks or until released by you surgeon.  Do not drive while  taking narcotics.  Wear the elastic stockings for three weeks following surgery during the day but you may remove then at night. Make sure you keep all of your appointments after your operation with all of your doctors and caregivers. You should call the office at the above phone number and make an appointment for approximately two weeks after the date of your surgery. Do not remove your surgical dressing. The dressing is waterproof; you may take showers in 3 days, but do not take tub baths or submerge the dressing. Please pick up a stool softener and laxative for home use as long as you are requiring pain medications. ICE to the affected knee every three hours for 30 minutes at a time and then as needed for pain and swelling.  Continue to use ice on the knee for pain and swelling from surgery. You may notice swelling that will progress down to the foot and ankle.  This is normal after surgery.  Elevate the leg when you are not up walking on it.   It is important for you to complete the blood thinner medication as prescribed by your doctor. Continue to use the breathing machine which will help keep your temperature down.  It is common for your temperature to cycle up and down following surgery, especially at night when you are not up moving around and exerting yourself.  The breathing machine keeps your lungs expanded and your temperature down.  RANGE OF MOTION AND STRENGTHENING EXERCISES  Rehabilitation of the knee is important following a knee injury or an   operation. After just a few days of immobilization, the muscles of the thigh which control the knee become weakened and shrink (atrophy). Knee exercises are designed to build up the tone and strength of the thigh muscles and to improve knee motion. Often times heat used for twenty to thirty minutes before working out will loosen up your tissues and help with improving the range of motion but do not use heat for the first two weeks following surgery.  These exercises can be done on a training (exercise) mat, on the floor, on a table or on a bed. Use what ever works the best and is most comfortable for you Knee exercises include:  Leg Lifts - While your knee is still immobilized in a splint or cast, you can do straight leg raises. Lift the leg to 60 degrees, hold for 3 sec, and slowly lower the leg. Repeat 10-20 times 2-3 times daily. Perform this exercise against resistance later as your knee gets better.  Quad and Hamstring Sets - Tighten up the muscle on the front of the thigh (Quad) and hold for 5-10 sec. Repeat this 10-20 times hourly. Hamstring sets are done by pushing the foot backward against an object and holding for 5-10 sec. Repeat as with quad sets.  A rehabilitation program following serious knee injuries can speed recovery and prevent re-injury in the future due to weakened muscles. Contact your doctor or a physical therapist for more information on knee rehabilitation.   POST-OPERATIVE OPIOID TAPER INSTRUCTIONS: It is important to wean off of your opioid medication as soon as possible. If you do not need pain medication after your surgery it is ok to stop day one. Opioids include: Codeine, Hydrocodone(Norco, Vicodin), Oxycodone(Percocet, oxycontin) and hydromorphone amongst others.  Long term and even short term use of opiods can cause: Increased pain response Dependence Constipation Depression Respiratory depression And more.  Withdrawal symptoms can include Flu like symptoms Nausea, vomiting And more Techniques to manage these symptoms Hydrate well Eat regular healthy meals Stay active Use relaxation techniques(deep breathing, meditating, yoga) Do Not substitute Alcohol to help with tapering If you have been on opioids for less than two weeks and do not have pain than it is ok to stop all together.  Plan to wean off of opioids This plan should start within one week post op of your joint replacement. Maintain the same  interval or time between taking each dose and first decrease the dose.  Cut the total daily intake of opioids by one tablet each day Next start to increase the time between doses. The last dose that should be eliminated is the evening dose.    SKILLED REHAB INSTRUCTIONS: If the patient is transferred to a skilled rehab facility following release from the hospital, a list of the current medications will be sent to the facility for the patient to continue.  When discharged from the skilled rehab facility, please have the facility set up the patient's Home Health Physical Therapy prior to being released. Also, the skilled facility will be responsible for providing the patient with their medications at time of release from the facility to include their pain medication, the muscle relaxants, and their blood thinner medication. If the patient is still at the rehab facility at time of the two week follow up appointment, the skilled rehab facility will also need to assist the patient in arranging follow up appointment in our office and any transportation needs.  MAKE SURE YOU:  Understand these instructions.  Will watch   your condition.  Will get help right away if you are not doing well or get worse.    Pick up stool softner and laxative for home use following surgery while on pain medications. Do NOT remove your dressing. You may shower.  Do not take tub baths or submerge incision under water. May shower starting three days after surgery. Please use a clean towel to pat the incision dry following showers. Continue to use ice for pain and swelling after surgery. Do not use any lotions or creams on the incision until instructed by your surgeon.  

## 2021-03-31 NOTE — Anesthesia Procedure Notes (Signed)
Anesthesia Regional Block: Adductor canal block  ? ?Pre-Anesthetic Checklist: , timeout performed,  Correct Patient, Correct Site, Correct Laterality,  Correct Procedure, Correct Position, site marked,  Risks and benefits discussed,  Surgical consent,  Pre-op evaluation,  At surgeon's request and post-op pain management ? ?Laterality: Right and Lower ? ?Prep: chloraprep     ?  ?Needles:  ?Injection technique: Single-shot ? ?Needle Type: Echogenic Needle   ? ? ?Needle Length: 9cm  ?Needle Gauge: 21  ? ? ? ?Additional Needles: ? ? ?Procedures:,,,, ultrasound used (permanent image in chart),,    ?Narrative:  ?Start time: 03/31/2021 1:54 PM ?End time: 03/31/2021 2:00 PM ?Injection made incrementally with aspirations every 5 mL. ? ?Performed by: Personally  ?Anesthesiologist: Annye Asa, MD ? ?Additional Notes: ?Pt identified in Holding room.  Monitors applied. Working IV access confirmed. Sterile prep R thigh.  #21ga ECHOgenic Arrow block needle into adductor canal with US guidance.  20cc 0.75% Ropivacaine injected incrementally after negative test dose.  Patient asymptomatic, VSS, no heme aspirated, tolerated well.   Jenita Seashore, MD ? ? ? ? ?

## 2021-03-31 NOTE — Progress Notes (Signed)
Assisted Dr. Jairo Ben with Right Knee Adductor Canal  block. Side rails up, monitors on throughout procedure. See vital signs in flow sheet. Tolerated Procedure well. ? ?

## 2021-03-31 NOTE — Op Note (Signed)
OPERATIVE REPORT ? ? ?03/31/2021 ? ?7:33 PM ? ?PATIENT:  Molly [name]  ? ?SURGEON:  Bertram Savin, MD ? ?ASSISTANT:  Nehemiah Massed, PA-C.  ? 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 ? ?POSTOPERATIVE DIAGNOSIS:  Same. ? ?PROCEDURE:  1.  Resection of right knee articulating spacer. ?2.  Reimplantation right total knee arthroplasty. ? ?ANESTHESIA:   Regional.   ?Spinal. ?MAC. ? ?ANTIBIOTICS:  2g Ancef. ?1g Vancomycin. ? ?ESTIMATED BLOOD LOSS: 100 cc. ? ?EXPLANTS: Zimmer Persona CR femur. ?Zimmer CR poly liner. ? ?IMPLANTS: Zimmer persona revision cemented femur size 5+ with 5 mm posterior medial augment and 14 x 135 mm straight splined stem. ?Zimmer persona revision tibia fixed bearing size D with 10 mm medial augment and 13 x 135 mm splined stem, 6 mm offset. ?Zimmer persona revision trabecular metal tibial central cone, size fixed tibia. ?Zimmer persona fixed-bearing Vivacit-E CCK poly liner, size 14 mm. ? ?SPECIMENS: Tibial interface membrane for tissue culture. ? ?TUBES AND DRAINS: Medium Hemovac x1. ? ?TOURNIQUET TIME: 120 minutes at 300 mmHg. ? ?COMPLICATIONS: None. ? ?DISPOSITION: Stable to PACU. ? ?SURGICAL INDICATIONS:  Molly [name] is a 51 y.o. female who underwent debridement of the right knee with placement of articulating antibiotic spacer for chronic MSSA native septic arthritis in October 2022.  She completed 6 weeks of IV antibiotics.  Repeat inflammatory marker testing was negative for infection.  She was indicated for removal of the spacer and placement of total knee arthroplasty.  Surgery was delayed until her A1c was under adequate control.  We discussed the risk, benefits, and alternatives, and she elected to proceed. ? ?The risks, benefits, and alternatives were discussed with the patient. There are risks associated with the surgery including, but not limited to, problems with anesthesia (death), infection,  instability (giving out of the joint), dislocation, differences in leg length/angulation/rotation, fracture of bones, loosening or failure of implants, hematoma (blood accumulation) which may require surgical drainage, blood clots, pulmonary embolism, nerve injury (foot drop and lateral thigh numbness), and blood vessel injury. The patient understands these risks and elects to proceed. ? ?PROCEDURE IN DETAIL: The patient was identified in the holding area using 2 identifiers.  Regional block was placed by anesthesia.  She was taken to the operating room, placed supine on the operating room table.  Spinal anesthesia was obtained.  A Foley catheter was placed.  Nonsterile tourniquet was applied to the right thigh.  The right lower extremity was prepped and draped in the normal sterile surgical fashion.  Timeout was called, verifying site and site of surgery.  She did receive IV antibiotics within 60 minutes of beginning the procedure. ? ?Esmarch exsanguination was used, and the tourniquet was elevated to 300 mmHg.  Her previous anterior incision was sharply excised with a #10 blade.  Full-thickness skin flaps were created.  Medial peripatellar arthrotomy was performed.  There was significant scarring within the knee.  No purulence or evidence of infection.  Medial release was performed.  Medial and lateral gutters were reestablished.  The knee was brought into extension.  Infrapatellar scar was excised with Bovie electrocautery.  The knee was then brought into flexion.  The tibial polyliner was loosened with an osteotome and removed by hand easily.  A bone tamp was used to remove the femoral component without any difficulty.  The cement intramedullary dowels were removed.  Posterior compartment capsulectomy was performed. ? ?Sequential reaming was performed up to a  size 13 mm reamer on the tibia, and a 14 mm reamer on the femur.  Intramedullary guide was used to freshen the proximal tibial cut.  There was an  uncontained medial defect.  I elected to prep the proximal tibia for a 10 mm medial augment.  The tibial was sized to a D, and 6 mm of offset was used to optimize coverage.  The proximal tibia was then prepped for a cone.  The trial tibial component was assembled on the back table and inserted. ? ?I then turned my attention to the femur.  The femur was sized to a 5+.  The distal cutting guide was pinned in the place, while using a laminar spreader to appropriately tension the flexion gap.  The rotation was set.  Distal cleanup cuts were made.  The posteromedial condyle was deficient, and I cut for a 5 mm augment.  The box cut was then made.  The trial femoral component was assembled and inserted.  I placed a 14 mm liner.   The knee was brought into extension.  Scar tissue was removed from the patellar remnant with a curette. The knee was stable to varus and valgus stress throughout the range of motion.  The flexion and extension gaps were well-balanced.  The patella tracked centrally using no thumbs technique. The knee was then irrigated with normal saline using pulse lavage and Prontosan solution. ? ?The real components were opened and assembled on the back table.  Small drill holes were made in the proximal tibia.  The cut bony surfaces were irrigated and dried.  The tibial cone was placed.  The tibial and femoral components were then inserted using a hybrid fixation technique.  Trial polyliner was placed, and excess cement was cleared.  The knee was brought into extension while the cement polymerized.  All excess cement was removed.  The real 14 mm CCK liner was inserted, and the locking bolt was placed and then tightened according to manufacturer's instructions. ? ?The tourniquet was then let down.  Meticulous hemostasis was achieved with the aqua mantis and Bovie electrocautery.  The knee was irrigated again with Prontosan solution and normal saline using pulse lavage.  A medium Hemovac drain was placed within  the lateral gutter.  The arthrotomy was closed with #1 Vicryl and #1 strata fix suture.  Deep dermal layer was closed with 2-0 Monocryl.  Skin was reapproximated with staples.  Dermabond was applied to the skin.  Once the glue was dry, an Aquacel Ag dressing was placed.  Bulky dressing was then placed with cast padding and an Ace wrap.  Patient was awakened from anesthesia and taken to the PACU in stable condition.  Sponge, needle, and instrument counts were correct at the end of the case x2.  There were no known complications. ? ?Please note that a surgical assistant was a medical necessity for this procedure in order to perform it in a safe and expeditious manner. Surgical assistant was necessary to retract the ligaments and vital neurovascular structures to prevent injury to them and also necessary for proper positioning of the limb to allow for anatomic placement of the prosthesis.  ?

## 2021-03-31 NOTE — Anesthesia Procedure Notes (Signed)
Spinal ? ?Patient location during procedure: OR ?Start time: 03/31/2021 4:17 PM ?Reason for block: surgical anesthesia ?Staffing ?Performed: resident/CRNA  ?Anesthesiologist: Annye Asa, MD ?Resident/CRNA: Eben Burow, CRNA ?Preanesthetic Checklist ?Completed: patient identified, IV checked, site marked, risks and benefits discussed, surgical consent, monitors and equipment checked, pre-op evaluation and timeout performed ?Spinal Block ?Patient position: sitting ?Prep: DuraPrep and site prepped and draped ?Patient monitoring: heart rate, cardiac monitor, continuous pulse ox and blood pressure ?Approach: midline ?Location: L3-4 ?Injection technique: single-shot ?Needle ?Needle type: Pencan  ?Needle gauge: 24 G ?Needle length: 10 cm ?Assessment ?Events: CSF return ?Additional Notes ?Pt placed in sitting position, spinal kit expiration date checked and verified, + CSF, - heme, pt tolerated well. Adequate sensory level. Dr Glennon Mac present and supervising throughout SAB placement. ? ? ? ?

## 2021-03-31 NOTE — Anesthesia Procedure Notes (Signed)
Procedure Name: [place] ?Date/Time: 03/31/2021 4:11 PM ?Performed by: Eben Burow, CRNA ?Pre-anesthesia Checklist: Patient identified, Emergency Drugs available, Suction available, Patient being monitored and Timeout performed ?Oxygen Delivery Method: Simple face mask ?Placement Confirmation: positive ETCO2 ? ? ? ? ?

## 2021-03-31 NOTE — H&P (Signed)
TOTAL KNEE REVISION ADMISSION H&P   Patient is being admitted for right revision total knee arthroplasty.   Subjective:   Chief Complaint:right knee pain.   HPI: Molly [name], 51 y.o. female, has a history of pain and functional disability in the right knee(s) due to  chronic NSA R knee  and patient has failed non-surgical conservative treatments for greater than 12 weeks to include flexibility and strengthening excercises, supervised PT with diminished ADL's post treatment, use of assistive devices, and activity modification. The indications for the revision of the total knee arthroplasty are  s/p I&D articulating spacer . Onset of symptoms was abrupt starting 1 years ago with stable course since that time.  Prior procedures on the right knee(s) include  I&D, articulating spacer .  Patient currently rates pain in the right knee(s) at 1 out of 10 with activity. There is  articulating spacer .  Patient has evidence of  as/p spacer  by imaging studies. This condition presents safety issues increasing the risk of falls. This patient has had  MSSA NSA .  There is no current active infection.       Patient Active Problem List    Diagnosis Date Noted   Medication monitoring encounter 12/08/2020   Osteomyelitis ([place]) 11/19/2020   Protein-calorie malnutrition, severe 11/11/2020   Unspecified severe protein-calorie malnutrition ([place]) 11/10/2020   Septic joint of right knee joint ([place]) 11/09/2020   Sinusitis 04/20/2017   Otalgia of both ears 03/30/2016   Strain of chest wall 11/30/2015   Amenorrhea, secondary 03/02/2015   Recurrent acute sinusitis 05/23/2014   Anemia 04/08/2014   Diabetic ketoacidosis without coma associated with type 1 diabetes mellitus (HCC)     HLD (hyperlipidemia) 04/07/2014   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   DKA (diabetic ketoacidoses) 04/06/2014   Nausea & vomiting 04/06/2014   Bipolar 1 disorder ([place])     Pain in joint, upper arm 10/22/2013   Routine general  medical examination at a health care facility 10/04/2013   Visit for occupational health examination 11/10/2012   Nonspecific abnormal electrocardiogram (ECG) (EKG) 10/02/2012   Osteoporosis 10/02/2012   Quit smoking 04/24/2012   Arthralgia of hand, left 12/01/2011   Encounter for long-term (current) use of other medications 09/14/2010   HEMATURIA UNSPECIFIED 08/11/2009   PURE HYPERCHOLESTEROLEMIA 08/05/2008   THROMBOCYTOPENIA 05/07/2008   CONTUSION, ANKLE 08/28/2007   TINEA VERSICOLOR 07/13/2007   SEBACEOUS CYST, INFECTED 07/13/2007   COUGH 04/18/2007   Migraine 03/06/2007   Seasonal and perennial allergic rhinitis 03/06/2007   Type 1 diabetes mellitus ([place]) 08/03/2006   Depression 08/03/2006        Past Medical History:  Diagnosis Date   ALLERGIC RHINITIS CAUSE UNSPECIFIED 03/06/2007   Bipolar 1 disorder ([place])     CONTUSION, ANKLE 08/28/2007   DEPRESSION 08/03/2006   DIABETES MELLITUS, TYPE I 08/03/2006   DM retinopathy ([place])     Encounter for long-term (current) use of other medications     Hyperlipidemia     MIGRAINE, CHRONIC 03/06/2007   Osteoporosis     SEBACEOUS CYST, INFECTED 07/13/2007   SMOKER 08/11/2009   THROMBOCYTOPENIA 05/07/2008   TINEA VERSICOLOR 07/13/2007         Past Surgical History:  Procedure Laterality Date   Abnormal U/S   12/10/1996   BIOPSY   11/16/2020    Procedure: BIOPSY;  Surgeon: Wilford Corner, MD;  Location: [facility];  Service: Endoscopy;;   ELECTROCARDIOGRAM   06/13/2006   ESOPHAGOGASTRODUODENOSCOPY (EGD) WITH PROPOFOL N/A 11/16/2020  Procedure: ESOPHAGOGASTRODUODENOSCOPY (EGD) WITH PROPOFOL;  Surgeon: Wilford Corner, MD;  Location: [facility];  Service: Endoscopy;  Laterality: N/A;   KNEE ARTHROPLASTY Right 11/13/2020    Procedure: DEBRIDEMENT KNEE WITH PLACEMENT ARTICULATING ANTIBIOTIC SPACER;  Surgeon: Rod Can, MD;  Location: [facility];  Service: Orthopedics;  Laterality: Right;  cultures,zimmer cement, tobramycin 2.4gm per  pack of cement(will need 4)   TUBAL LIGATION               Current Outpatient Medications  Medication Sig Dispense Refill Last Dose   Alcohol Swabs (ALCOHOL PREP) 70 % PADS 8/day  250.01 800 each 3     amLODipine (NORVASC) 10 MG tablet Take 10 mg by mouth daily.         Calcium-Magnesium-Vitamin D (CALCIUM 1200+D3 PO) Take 1 tablet by mouth daily.         carbamazepine (TEGRETOL) 200 MG tablet Take 1 tablet (200 mg total) by mouth every morning AND 5 tablets (1,000 mg total) at bedtime. (Patient taking differently: Take 1 tablet (200 mg total) by mouth every morning AND 6 tablets (1,200 mg total) at bedtime.)         cetirizine-pseudoephedrine (ZYRTEC-D) 5-120 MG tablet Take 1 tablet by mouth daily. 90 tablet 3     cyclobenzaprine (FLEXERIL) 10 MG tablet Take 20 mg by mouth at bedtime.         feeding supplement, GLUCERNA SHAKE, (GLUCERNA SHAKE) LIQD Take 237 mLs by mouth 3 (three) times daily between meals. (Patient taking differently: Take 237 mLs by mouth 2 (two) times daily between meals.)   0     Fluticasone Propionate, Inhal, (FLOVENT DISKUS) 250 MCG/ACT AEPB Inhale 1 puff into the lungs in the morning and at bedtime.         glucose blood (FREESTYLE LITE) test strip 8/day dx 250.01, and lancets 800 each 3     Insulin Infusion Pump Supplies (MINIMED INFUSION SET-MMT 398) MISC Inject 1 Device into the skin 3 days. Change every 3 days (Patient not taking: Reported on 11/09/2020) 30 each 3     Insulin Infusion Pump Supplies (PARADIGM RESERVOIR 3ML) MISC Inject 1 Device into the skin 3 days. Change every 3 days (Patient not taking: Reported on 12/07/2020) 30 each 3     lamoTRIgine (LAMICTAL) 200 MG tablet Take 400 mg by mouth daily.         Lancets (FREESTYLE) lancets Use as instructed dx 250.01 800 each 3     lithium carbonate (LITHOBID) 300 MG CR tablet Take 300 mg by mouth every evening.         LYUMJEV 100 UNIT/ML SOLN Insulin pump (Patient taking differently: 200 Units. Via Insulin pump)          Multiple Vitamin (MULTIVITAMIN) tablet Take 1 tablet by mouth daily.         oxyCODONE-acetaminophen (PERCOCET/ROXICET) 5-325 MG tablet Take 1-2 tablets by mouth every 4 (four) hours as needed for severe pain or moderate pain. 30 tablet 0     polyethylene glycol (MIRALAX / GLYCOLAX) 17 g packet Take 17 g by mouth daily as needed for mild constipation. (Patient taking differently: Take 17 g by mouth daily.)         Potassium 99 MG TABS Take 99 mg by mouth daily.         promethazine (PHENERGAN) 25 MG tablet Take 25 mg by mouth every 8 (eight) hours as needed for nausea or vomiting.         rosuvastatin (  CRESTOR) 40 MG tablet TAKE 1 TABLET BY MOUTH DAILY GENERIC EQUIVALENT FOR CRESTOR (Patient taking differently: Take 40 mg by mouth daily.) 90 tablet 0     ziprasidone (GEODON) 60 MG capsule Take 120 mg by mouth every evening.         zolpidem (AMBIEN) 10 MG tablet Take 2 tablets (20 mg total) by mouth at bedtime. 4 tablet 0      No current facility-administered medications for this visit.         Allergies  Allergen Reactions   Varenicline Tartrate Rash      Chantix    Social History         Tobacco Use   Smoking status: Former      Types: Cigarettes      Quit date: 10/16/2010      Years since quitting: 10.3   Smokeless tobacco: Never  Substance Use Topics   Alcohol use: No         Family History  Problem Relation Age of Onset   Diabetes Father          (Oral Agents)   Diabetes Paternal Grandmother     Hypertension Paternal Grandmother     Diabetes Paternal Grandfather     Cancer Paternal Grandfather          colon   Hypertension Paternal Grandfather         Review of Systems  Constitutional: Negative.      Objective:   Physical Exam Constitutional:      Appearance: Normal appearance.  HENT:     Head: Normocephalic and atraumatic.     Nose: Nose normal.     Mouth/Throat:     Mouth: Mucous membranes are dry.     Pharynx: Oropharynx is clear.  Eyes:      Extraocular Movements: Extraocular movements intact.     Pupils: Pupils are equal, round, and reactive to light.  Cardiovascular:     Rate and Rhythm: Normal rate and regular rhythm.  Pulmonary:     Effort: Pulmonary effort is normal.  Abdominal:     General: Abdomen is flat.     Palpations: Abdomen is soft.  Genitourinary:    Comments: deferred Musculoskeletal:       Legs:    Comments: No swelling or effusion, can SLR, good ROM  Skin:    General: Skin is warm and dry.     Capillary Refill: Capillary refill takes less than 2 seconds.  Neurological:     General: No focal deficit present.     Mental Status: She is alert and oriented to person, place, and time.  Psychiatric:        Mood and Affect: Mood normal.        Behavior: Behavior normal.        Thought Content: Thought content normal.        Judgment: Judgment normal.      Vital signs in last 24 hours: @VSRANGES @   Labs:   Estimated body mass index is 24.38 kg/m as calculated from the following:   Height as of 11/16/20: 5\' 7"  (1.702 m).   Weight as of 11/17/20: 70.6 kg.   Imaging Review Plain radiographs demonstrate  s/p articulating spacer  of the right knee(s). The overall alignment is neutral. The bone quality appears to be  adequate w medial tibial defect  for age and reported activity level.        Assessment/Plan:   R knee NSA, right knee(s) with  articulating spacer.    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 Revision total knee arthroplasty is deemed medically necessary. The treatment options including medical management, injection therapy, arthroscopy and revision arthroplasty were discussed at length. The risks and benefits of revision total knee arthroplasty were presented and reviewed. The risks due to aseptic loosening, infection,  stiffness, patella tracking problems, thromboembolic complications and other imponderables were discussed. The patient acknowledged the explanation, agreed to proceed with the plan and consent was signed. Patient is being admitted for inpatient treatment for surgery, pain control, PT, OT, prophylactic antibiotics, VTE prophylaxis, progressive ambulation and ADL's and discharge planning.The patient is planning to be discharged  home w OPPT

## 2021-03-31 NOTE — Anesthesia Postprocedure Evaluation (Signed)
Anesthesia Post Note ? ?Patient: [name] ? ?Procedure(s) Performed: RESECTION OF RIGHT KNEE ARTICULATING SPACER REVISION OF TOTAL KNEE ARTHROPLASTY (Right: Knee) ? ?  ? ?Patient location during evaluation: PACU ?Anesthesia Type: Spinal ?Level of consciousness: awake and alert, patient cooperative and oriented ?Pain management: pain level controlled ?Vital Signs Assessment: post-procedure vital signs reviewed and stable ?Respiratory status: spontaneous breathing, nonlabored ventilation and respiratory function stable ?Cardiovascular status: blood pressure returned to baseline and stable ?Postop Assessment: spinal receding, no apparent nausea or vomiting and adequate PO intake (nausea relieved, wiggling toes) ?Anesthetic complications: no ? ? ?No notable events documented. ? ?Last Vitals:  ?Vitals:  ? 03/31/21 2045 03/31/21 2100  ?BP: (!) 152/69 (!) 160/68  ?Pulse: 89 95  ?Resp: 15 14  ?Temp:    ?SpO2: 100% 100%  ?  ?Last Pain:  ?Vitals:  ? 03/31/21 2100  ?TempSrc:   ?PainSc: 0-No pain  ? ? ?  ?  ?  ?  ?  ?  ? ?Myeasha Ballowe,E. Nishant Schrecengost ? ? ? ? ?

## 2021-03-31 NOTE — Transfer of Care (Signed)
Immediate Anesthesia Transfer of Care Note ? ?Patient: Molly [name] ? ?Procedure(s) Performed: RESECTION OF RIGHT KNEE ARTICULATING SPACER REVISION OF TOTAL KNEE ARTHROPLASTY (Right: Knee) ? ?Patient Location: PACU ? ?Anesthesia Type:MAC, Regional and Spinal ? ?Level of Consciousness: awake, alert  and oriented ? ?Airway & Oxygen Therapy: Patient Spontanous Breathing ? ?Post-op Assessment: Report given to RN and Post -op Vital signs reviewed and stable ? ?Post vital signs: Reviewed and stable ? ?Last Vitals:  ?Vitals Value Taken Time  ?BP    ?Temp    ?Pulse 89 03/31/21 2005  ?Resp 14 03/31/21 2005  ?SpO2 100 % 03/31/21 2005  ?Vitals shown include unvalidated device data. ? ?Last Pain:  ?Vitals:  ? 03/31/21 1506  ?TempSrc:   ?PainSc: 0-No pain  ?   ? ?Patients Stated Pain Goal: 3 (03/31/21 1306) ? ?Complications: No notable events documented. ?

## 2021-04-01 LAB — CBC
HCT: 32.2 % — ABNORMAL LOW (ref 36.0–46.0)
Hemoglobin: 10.6 g/dL — ABNORMAL LOW (ref 12.0–15.0)
MCH: 32.2 pg (ref 26.0–34.0)
MCHC: 32.9 g/dL (ref 30.0–36.0)
MCV: 97.9 fL (ref 80.0–100.0)
Platelets: 136 10*3/uL — ABNORMAL LOW (ref 150–400)
RBC: 3.29 MIL/uL — ABNORMAL LOW (ref 3.87–5.11)
RDW: 12.3 % (ref 11.5–15.5)
WBC: 8.4 10*3/uL (ref 4.0–10.5)
nRBC: 0 % (ref 0.0–0.2)

## 2021-04-01 LAB — GLUCOSE, CAPILLARY
Glucose-Capillary: 225 mg/dL — ABNORMAL HIGH (ref 70–99)
Glucose-Capillary: 234 mg/dL — ABNORMAL HIGH (ref 70–99)
Glucose-Capillary: 243 mg/dL — ABNORMAL HIGH (ref 70–99)
Glucose-Capillary: 244 mg/dL — ABNORMAL HIGH (ref 70–99)
Glucose-Capillary: 251 mg/dL — ABNORMAL HIGH (ref 70–99)
Glucose-Capillary: 266 mg/dL — ABNORMAL HIGH (ref 70–99)

## 2021-04-01 LAB — BASIC METABOLIC PANEL
Anion gap: 5 (ref 5–15)
BUN: 14 mg/dL (ref 6–20)
CO2: 25 mmol/L (ref 22–32)
Calcium: 8.8 mg/dL — ABNORMAL LOW (ref 8.9–10.3)
Chloride: 105 mmol/L (ref 98–111)
Creatinine, Ser: 0.64 mg/dL (ref 0.44–1.00)
GFR, Estimated: >60 mL/min (ref >60)
Glucose, Bld: 301 mg/dL — ABNORMAL HIGH (ref 70–99)
Potassium: 4.2 mmol/L (ref 3.5–5.1)
Sodium: 135 mmol/L (ref 135–145)

## 2021-04-01 MED ORDER — HYDROMORPHONE HCL 2 MG PO TABS
2.0000 mg | ORAL_TABLET | ORAL | Status: DC | PRN
  Administered 2021-04-02: 2 mg via ORAL
  Filled 2021-04-01: qty 1

## 2021-04-01 MED ORDER — PROMETHAZINE HCL 25 MG PO TABS
25.0000 mg | ORAL_TABLET | Freq: Four times a day (QID) | ORAL | Status: DC | PRN
Start: 2021-04-01 — End: 2021-04-04
  Administered 2021-04-01 – 2021-04-04 (×3): 25 mg via ORAL
  Filled 2021-04-01 (×3): qty 1

## 2021-04-01 MED ORDER — HYDROMORPHONE HCL 2 MG PO TABS
4.0000 mg | ORAL_TABLET | ORAL | Status: DC | PRN
  Administered 2021-04-01 – 2021-04-02 (×4): 4 mg via ORAL
  Filled 2021-04-01 (×5): qty 2

## 2021-04-01 MED ORDER — INSULIN ASPART 100 UNIT/ML IJ SOLN
4.0000 [IU] | Freq: Three times a day (TID) | INTRAMUSCULAR | Status: DC
  Administered 2021-04-01 – 2021-04-03 (×6): 4 [IU] via SUBCUTANEOUS

## 2021-04-01 MED ORDER — INSULIN GLARGINE-YFGN 100 UNIT/ML ~~LOC~~ SOLN
23.0000 [IU] | Freq: Every day | SUBCUTANEOUS | Status: DC
  Administered 2021-04-01: 12:00:00 23 [IU] via SUBCUTANEOUS
  Filled 2021-04-01 (×2): qty 0.23

## 2021-04-01 MED ORDER — INSULIN GLARGINE-YFGN 100 UNIT/ML ~~LOC~~ SOLN: 23.0000 [IU] | Freq: Every day | SUBCUTANEOUS | Status: DC

## 2021-04-01 MED ORDER — HYDROMORPHONE HCL 1 MG/ML IJ SOLN
1.0000 mg | INTRAMUSCULAR | Status: AC
  Administered 2021-04-01: 05:00:00 1 mg via INTRAVENOUS
  Filled 2021-04-01: qty 1

## 2021-04-01 MED ORDER — INSULIN PUMP
Freq: Three times a day (TID) | SUBCUTANEOUS | Status: DC
  Filled 2021-04-01: qty 1

## 2021-04-01 MED ORDER — INSULIN ASPART 100 UNIT/ML IJ SOLN
0.0000 [IU] | Freq: Three times a day (TID) | INTRAMUSCULAR | Status: DC
  Administered 2021-04-01 – 2021-04-02 (×2): 3 [IU] via SUBCUTANEOUS
  Administered 2021-04-02 – 2021-04-03 (×3): 7 [IU] via SUBCUTANEOUS
  Administered 2021-04-03: 5 [IU] via SUBCUTANEOUS
  Administered 2021-04-03: 3 [IU] via SUBCUTANEOUS

## 2021-04-01 MED ORDER — PROMETHAZINE HCL 25 MG PO TABS: 25.0000 mg | ORAL_TABLET | Freq: Four times a day (QID) | ORAL | Status: DC | PRN

## 2021-04-01 MED ORDER — PROMETHAZINE HCL 25 MG PO TABS
25.0000 mg | ORAL_TABLET | ORAL | Status: AC
  Administered 2021-04-01: 05:00:00 25 mg via ORAL
  Filled 2021-04-01: qty 1

## 2021-04-01 MED ORDER — INSULIN ASPART 100 UNIT/ML IJ SOLN
0.0000 [IU] | Freq: Three times a day (TID) | INTRAMUSCULAR | Status: DC
  Administered 2021-04-01: 11:00:00 5 [IU] via SUBCUTANEOUS

## 2021-04-01 MED ORDER — HYDROMORPHONE HCL 1 MG/ML IJ SOLN
1.0000 mg | INTRAMUSCULAR | Status: DC | PRN
  Administered 2021-04-01: 18:00:00 1 mg via INTRAVENOUS
  Filled 2021-04-01: qty 2

## 2021-04-01 MED ORDER — SODIUM CHLORIDE 0.9 % IV SOLN
25.0000 mg | Freq: Four times a day (QID) | INTRAVENOUS | Status: DC | PRN
  Administered 2021-04-04: 25 mg via INTRAVENOUS
  Filled 2021-04-01 (×3): qty 1

## 2021-04-01 MED ORDER — INSULIN ASPART 100 UNIT/ML IJ SOLN
0.0000 [IU] | Freq: Every day | INTRAMUSCULAR | Status: DC
  Administered 2021-04-01: 23:00:00 3 [IU] via SUBCUTANEOUS
  Administered 2021-04-02: 4 [IU] via SUBCUTANEOUS

## 2021-04-01 NOTE — Plan of Care (Signed)

## 2021-04-01 NOTE — Progress Notes (Signed)
? ? ?Subjective: ? ?Patient reports pain as mild to moderate.  Denies N/V/CP/SOB. C/o knee pain - oxy isn't helping very much. ? ?Objective:  ? ?VITALS:   ?Vitals:  ? 04/01/21 3300 04/01/21 0743 04/01/21 0758 04/01/21 0939  ?BP: (!) 152/62  (!) 159/60 (!) 152/55  ?Pulse: 91  79 96  ?Resp: 16  17 16   ?Temp: 98.4 ?F (36.9 ?C)  98.6 ?F (37 ?C) 100.1 ?F (37.8 ?C)  ?TempSrc: Oral  Oral Oral  ?SpO2: 98% 100% 99% 99%  ?Weight:      ?Height:      ?HV: 60 recorded for yesterday ? ?NAD ?ABD soft ?Sensation intact distally ?Intact pulses distally ?Dorsiflexion/Plantar flexion intact ?Incision: dressing C/D/I ?Compartment soft ?HV ss ? ? ?Lab Results  ?Component Value Date  ? WBC 8.4 04/01/2021  ? HGB 10.6 (L) 04/01/2021  ? HCT 32.2 (L) 04/01/2021  ? MCV 97.9 04/01/2021  ? PLT 136 (L) 04/01/2021  ? ?BMET ?   ?Component Value Date/Time  ? NA 135 04/01/2021 0319  ? K 4.2 04/01/2021 0319  ? CL 105 04/01/2021 0319  ? CO2 25 04/01/2021 0319  ? GLUCOSE 301 (H) 04/01/2021 0319  ? BUN 14 04/01/2021 0319  ? CREATININE 0.64 04/01/2021 0319  ? CREATININE 0.46 (L) 12/07/2020 1422  ? CALCIUM 8.8 (L) 04/01/2021 0319  ? CALCIUM 9.3 05/23/2011 0737  ? GFRNONAA >60 04/01/2021 0319  ? ? ?Recent Results (from the past 240 hour(s))  ?Surgical PCR Screen     Status: None  ? Collection Time: 03/25/21  9:45 AM  ? Specimen: Nasal Mucosa; Nasal Swab  ?Result Value Ref Range Status  ? MRSA, PCR NEGATIVE NEGATIVE Final  ? Staphylococcus aureus NEGATIVE NEGATIVE Final  ?  Comment: (NOTE) ?The Xpert SA Assay (FDA approved for NASAL specimens in patients 72 ?years of age and older), is one component of a comprehensive ?surveillance program. It is not intended to diagnose infection nor to ?guide or monitor treatment. ?Performed at [facility], [address] ?[place], [place] ?  ?Aerobic/Anaerobic Culture w Gram Stain (surgical/deep wound)     Status: None (Preliminary result)  ? Collection Time: 03/31/21  2:14 PM  ? Specimen:  Soft Tissue, Other  ?Result Value Ref Range Status  ? Specimen Description   Final  ?  TISSUE RIGHT KNEE ?Performed at [facility], [address] [place], [place] ?  ? Special Requests   Final  ?  NONE ?Performed at [facility], [address], [place], [place] ?  ? Gram Stain   Final  ?  NO SQUAMOUS EPITHELIAL CELLS SEEN ?FEW WBC SEEN ?NO ORGANISMS SEEN ?  ? Culture   Final  ?  NO GROWTH < 12 HOURS ?Performed at [facility], [address] [place], [place] ?  ? Report Status PENDING  Incomplete  ? ? ? ? ?Assessment/Plan: ?1 Day Post-Op  ? ?Principal Problem: ?  Septic joint of right knee joint (HCC) ?Active Problems: ?  Septic arthritis of knee, right (HCC) ? ? ?WBAT with walker ?DVT ppx: Aspirin, SCDs, TEDS ?PO pain control: d/c PO oxy and start dilaudid ?DM1: hyperglycemia, DM coordinator to make changes to insulin regimen, goal BS < 200, will need endocrine f/u ?PT/OT ?Will start PO keflex once routine IV abx completed ?Intraop culture (3/8): gram stain negative, NGTD ?Dispo: D/C home with OPPT when medically ready, likely tomorrow vs Saturday ? ? ?Saturday  J Luccas Towell ?04/01/2021, 10:52 AM ? ? ?Samson Frederic, MD ?[phone] ?[facility] now [facility]  [place] ?[address], [address], [place], [place] [address] ?Phone: [phone] ?[email or web address] ?Facebook  Runner, broadcasting/film/video  ?  ? ? ?

## 2021-04-01 NOTE — Progress Notes (Signed)
Patient acknowledged that she had sleep medication at the bedside with initial admission assessment. Patient advised of hospital policy and need to send medications home or to the pharmacy. Patient reports she does not have anyone that can come get her medications and she will not be sending them to the pharmacy. Charge Nurse Elita Quick) made aware and advised the patient of the hospital policy with refusal to send medications to the pharmacy also. Charge Nurse advised to reach out to the [facility]) to make aware. AC advised to make On-Call MD aware d/t patient having PRN pain meds/scheduled sleep aids. On-Call PA (Dixon) made aware and reports okay to give PRN pain medications, but hold sleep medication.  ?

## 2021-04-01 NOTE — TOC Transition Note (Signed)
Transition of Care (TOC) - CM/SW Discharge Note ? ? ?Patient Details  ?Name: Molly [name] ?MRN: [id number] ?Date of Birth: 1970-04-16 ? ?Transition of Care (TOC) CM/SW Contact:  ?Luevenia Mcavoy, LCSW ?Phone Number: ?04/01/2021, 9:26 AM ? ? ?Clinical Narrative:    ?Met with pt and confirming she has all needed DME at home.  Plan for OPPT at [facility].  No TOC needs. ? ? ?Final next level of care: OP Rehab ?Barriers to Discharge: No Barriers Identified ? ? ?Patient Goals and CMS Choice ?Patient states their goals for this hospitalization and ongoing recovery are:: return home ?  ?  ? ?Discharge Placement ?  ?           ?  ?  ?  ?  ? ?Discharge Plan and Services ?  ?  ?           ?DME Arranged: N/A ?DME Agency: NA ?  ?  ?  ?  ?  ?  ?  ?  ? ?Social Determinants of Health (SDOH) Interventions ?  ? ? ?Readmission Risk Interventions ?No flowsheet data found. ? ? ? ? ?

## 2021-04-01 NOTE — Evaluation (Addendum)
Physical Therapy Evaluation ?Patient Details ?Name: Molly [name] ?MRN: [id number] ?DOB: 12-29-1970 ?Today's Date: 04/01/2021 ? ?History of Present Illness ? Pt is a 51 y.o. female s/p Reimplantation right total knee arthroplasty and resection of right knee articulating spacer. PMH significant for HTN, HLD, bipolar disorder, depression, DM, and R TKA.  ?Clinical Impression ? Pt is POD #1 s/p Reimplantation right total knee arthroplasty and resection of right knee articulating spacer resulting in the deficits listed below (see PT Problem List). Pt performed sit to stand transfers with supervision for safety and cues for safe hand placement. Pt ambulated total of ~3640ft with supervision for safety. Cues provided for step to gait patten with no LOB observed. PT reviewed circulation interventions for promotion of DVT prevention, pt demonstrated understanding. Pt will have assist from her mother and son upon d/c. Pt will benefit from skilled PT to maximize functional mobility and increase independence.  ?   ?   ? ?Recommendations for follow up therapy are one component of a multi-disciplinary discharge planning process, led by the attending physician.  Recommendations may be updated based on patient status, additional functional criteria and insurance authorization. ? ?Follow Up Recommendations Follow physician's recommendations for discharge plan and follow up therapies ? ?  ?Assistance Recommended at Discharge Intermittent Supervision/Assistance  ?Patient can return home with the following ?   ? ?  ?Equipment Recommendations None recommended by PT (pt owns RW)  ?Recommendations for Other Services ?    ?  ?Functional Status Assessment Patient has had a recent decline in their functional status and demonstrates the ability to make significant improvements in function in a reasonable and predictable amount of time.  ? ?  ?Precautions / Restrictions Precautions ?Precautions: Fall ?Restrictions ?Weight Bearing Restrictions:  Yes ?RLE Weight Bearing: Weight bearing as tolerated ?Other Position/Activity Restrictions: drain R LE  ? ?  ? ?Mobility ? Bed Mobility ?Overal bed mobility: Needs Assistance ?Bed Mobility: Supine to Sit ?  ?  ?Supine to sit: Supervision, HOB elevated ?  ?  ?  ?  ? ?Transfers ?Overall transfer level: Needs assistance ?Equipment used: Rolling walker (2 wheels) ?Transfers: Sit to/from Stand ?Sit to Stand: Supervision ?  ?  ?  ?  ?  ?General transfer comment: x1 from EOB, x1 from toilet; cues for safe hand placement, use of grab bars to stand from toilet ?  ? ?Ambulation/Gait ?Ambulation/Gait assistance: Supervision ?Gait Distance (Feet): 20 Feet ?Assistive device: Rolling walker (2 wheels) ?Gait Pattern/deviations: Step-to pattern, Decreased stride length, Decreased weight shift to right, Antalgic ?Gait velocity: decr ?  ?  ?General Gait Details: Additional 7120ft from bathroom to recliner. Initially observed tobe walking on forefoot with decreased WB on R, cues for foot flat and increased WB through R LE with mild improvements observed. pt deferring further ambulation distance due to pain. no overt LOB observed. Pt initially wanting to use rollator, states that she has been using and feels she has more control with rollator, educated on benefits of improved stability with use of RW today, pt agreeable and demonstrating good technique, increased WB through UEs,  and no LOB or knee buckling with use of RW. ? ?Stairs ?  ?  ?  ?  ?  ? ?Wheelchair Mobility ?  ? ?Modified Rankin (Stroke Patients Only) ?  ? ?  ? ?Balance Overall balance assessment: Needs assistance ?Sitting-balance support: Feet supported ?Sitting balance-Leahy Scale: Good ?  ?  ?Standing balance support: Bilateral upper extremity supported, During functional activity, No upper  extremity supported ?Standing balance-Leahy Scale: Fair ?Standing balance comment: able to stand at sink without UE support for hand hygiene and supervision for safety ?  ?  ?  ?  ?  ?   ?  ?  ?  ?  ?  ?   ? ? ? ?Pertinent Vitals/Pain Pain Assessment ?Pain Assessment: 0-10 ?Pain Score: 8  ?Pain Location: Rt knee ?Pain Descriptors / Indicators: Aching, Grimacing, Sore, Tender ?Pain Intervention(s): Limited activity within patient's tolerance, Monitored during session, Premedicated before session, Repositioned, Ice applied  ? ? ?Home Living Family/patient expects to be discharged to:: Private residence ?Living Arrangements: Parent;Children ?Available Help at Discharge: Family;Available 24 hours/day ?Type of Home: House ?Home Access: Stairs to enter ?Entrance Stairs-Rails: Can reach both ("railing is wobbly") ?Entrance Stairs-Number of Steps: 5-6 ?  ?Home Layout: One level ?Home Equipment: Rollator (4 wheels);Rolling Walker (2 wheels);Shower seat;Grab bars - tub/shower;Crutches;Transport chair;Wheelchair - manual ?Additional Comments: mother and son available to assist upon d./c  ?  ?Prior Function Prior Level of Function : Independent/Modified Independent;Driving ?  ?  ?  ?  ?  ?  ?Mobility Comments: rollator at baseline ?  ?  ? ? ?Hand Dominance  ?   ? ?  ?Extremity/Trunk Assessment  ? Upper Extremity Assessment ?Upper Extremity Assessment: Overall WFL for tasks assessed ?  ? ?Lower Extremity Assessment ?Lower Extremity Assessment: RLE deficits/detail ?RLE Deficits / Details: B DF/PF strength 4/5, Difficulty with SLR due to pain, fair quad set strength ?RLE: Unable to fully assess due to pain ?  ? ?Cervical / Trunk Assessment ?Cervical / Trunk Assessment: Normal  ?Communication  ? Communication: No difficulties  ?Cognition Arousal/Alertness: Awake/alert ?Behavior During Therapy: Flat affect ?Overall Cognitive Status: Within Functional Limits for tasks assessed ?  ?  ?  ?  ?  ?  ?  ?  ?  ?  ?  ?  ?  ?  ?  ?  ?  ?  ?  ? ?  ?General Comments   ? ?  ?Exercises Total Joint Exercises ?Ankle Circles/Pumps: AROM, Both, 20 reps, Seated ?Quad Sets: AROM, Right, 10 reps, Seated ?Heel Slides: AROM, Right, 10  reps, Seated  ? ?Assessment/Plan  ?  ?PT Assessment Patient needs continued PT services  ?PT Problem List Decreased strength;Decreased range of motion;Decreased balance;Decreased activity tolerance;Decreased mobility;Decreased knowledge of use of DME;Pain ? ?   ?  ?PT Treatment Interventions DME instruction;Gait training;Stair training;Functional mobility training;Therapeutic activities;Therapeutic exercise;Balance training;Neuromuscular re-education;Patient/family education   ? ?PT Goals (Current goals can be found in the Care Plan section)  ?Acute Rehab PT Goals ?Patient Stated Goal: decrease pain ?PT Goal Formulation: With patient ?Time For Goal Achievement: 04/15/21 ?Potential to Achieve Goals: Good ? ?  ?Frequency 7X/week ?  ? ? ?Co-evaluation   ?  ?  ?  ?  ? ? ?  ?AM-PAC PT "6 Clicks" Mobility  ?Outcome Measure Help needed turning from your back to your side while in a flat bed without using bedrails?: A Little ?Help needed moving from lying on your back to sitting on the side of a flat bed without using bedrails?: A Little ?Help needed moving to and from a bed to a chair (including a wheelchair)?: A Little ?Help needed standing up from a chair using your arms (e.g., wheelchair or bedside chair)?: A Little ?Help needed to walk in hospital room?: A Little ?Help needed climbing 3-5 steps with a railing? : A Lot ?6 Click Score: 17 ? ?  ?End  of Session Equipment Utilized During Treatment: Gait belt ?Activity Tolerance: Patient tolerated treatment well ?Patient left: in chair;with call bell/phone within reach ?Nurse Communication: Mobility status ?PT Visit Diagnosis: Unsteadiness on feet (R26.81);Muscle weakness (generalized) (M62.81);Pain ?Pain - Right/Left: Right ?Pain - part of body: Knee ?  ? ?Time: 6945-0388 ?PT Time Calculation (min) (ACUTE ONLY): 24 min ? ? ?Charges:   PT Evaluation ?$PT Eval Low Complexity: 1 Low ?PT Treatments ?$Therapeutic Activity: 8-22 mins ?  ?   ? ? ?Lyman Speller., PT, DPT  ?Acute  Rehabilitation Services  ?Office [phone] ? ?04/01/2021, 11:58 AM ? ?

## 2021-04-01 NOTE — Progress Notes (Addendum)
Inpatient Diabetes Program Recommendations ? ?AACE/ADA: New Consensus Statement on Inpatient Glycemic Control (2015) ? ?Target Ranges:  Prepandial:   less than 140 mg/dL ?     Peak postprandial:   less than 180 mg/dL (1-2 hours) ?     Critically ill patients:  140 - 180 mg/dL  ? ?Lab Results  ?Component Value Date  ? GLUCAP 225 (H) 04/01/2021  ? HGBA1C 6.5 (H) 03/25/2021  ? ? ?Review of Glycemic Control ? Latest Reference Range & Units 03/31/21 20:34 03/31/21 20:59 04/01/21 00:42 04/01/21 07:54  ?Glucose-Capillary 70 - 99 mg/dL 195 (H) 208 (H) 234 (H) 225 (H)  ?(H): Data is abnormally high ? ?Diabetes history: DM1 ?Outpatient Diabetes medications:  ?Insulin pump ?Medtronic 630G- manually boluses, does not use bolus wizard ?Reports using a 1:10 grams carb ratio ?ISF 1:40>120 (I unit drops glucose 40 mg/dl; uses target glucose of 120 mg/dl) ?Medtronic Pump settings: ?Basal:  ?0000-0400: 0.4 ?0400-0900: 2.1 ?0900-1200: 1.1 ?1200-2200: 1.1 ?2200-0000: 1.1 ?Total basal 2836 units/24hrs ?Current orders for Inpatient glycemic control: Novolog 0-9 units TID and 0-5 QHS, decadron 4 mg on 3/8 at 1600 ? ?Inpatient Diabetes Program Recommendations:   ? ?Please DC correction scale and add insulin pump therapy. ? ?Spoke with patient at bedside briefly.  She is in a lot of pain and c/o reflux.  Notified RN.  Patient has had her insulin pump on since last evening and is bolusing for correction and CHO coverage.  She is current with her PCP.  Last saw an endocrinologist last summer with [facility].  She does not have an endo currently.   ? ?She was scheduled for surgery in January but glucose was > than 600 mg/dL and A1C was 8.4% on 1/24.  She must have adjusted pump settings on her own to bring her A1C down to 6.2 % on 03/25/20 as she does not have an endo or pcp that adjusts pump settings.   ? ?Addendum@10 :55: ? ?CBGs are consistently > 200mg /dL.  RN just checked CBG at 1050 and it is 251 mg/dL.  Asked patient how old the insulin is in  her pump.  She said it is a new site and new insulin as of 3/8 in the evening.  As she does not have an endo or PCP that adjusts her pump, I am recommending DC pump and start basal/bolus.  Discussed with Dr. Lyla Glassing.  He is in agreement. ? ?Please consider: ?Semglee 23 units QD to start now ?Novolog 0-9 units TID and 0-5 units QHS ?Novolog 4 units TID with meals if eats at least 50% ? ?Discussed with patient importance of establishing with an endocrinologist.  Will provide her with a list of local endo's. ? ?Addendum@1636 : ? ?Please consider, Novolog 0-15 units TID.  Discussed with Dr. Lyla Glassing.  Verbal order received to increase correction.   ? ?Will continue to follow while inpatient. ? ?Thank you, ?Reche Dixon, MSN, RN ?Diabetes Coordinator ?Inpatient Diabetes Program ?[phone] (team pager from 8a-5p) ? ? ? ? ? ?

## 2021-04-02 LAB — CBC
HCT: 29.1 % — ABNORMAL LOW (ref 36.0–46.0)
Hemoglobin: 9.4 g/dL — ABNORMAL LOW (ref 12.0–15.0)
MCH: 31.8 pg (ref 26.0–34.0)
MCHC: 32.3 g/dL (ref 30.0–36.0)
MCV: 98.3 fL (ref 80.0–100.0)
Platelets: 143 10*3/uL — ABNORMAL LOW (ref 150–400)
RBC: 2.96 MIL/uL — ABNORMAL LOW (ref 3.87–5.11)
RDW: 12.2 % (ref 11.5–15.5)
WBC: 7.9 10*3/uL (ref 4.0–10.5)
nRBC: 0 % (ref 0.0–0.2)

## 2021-04-02 LAB — GLUCOSE, CAPILLARY
Glucose-Capillary: 302 mg/dL — ABNORMAL HIGH (ref 70–99)
Glucose-Capillary: 306 mg/dL — ABNORMAL HIGH (ref 70–99)
Glucose-Capillary: 225 mg/dL — ABNORMAL HIGH (ref 70–99)
Glucose-Capillary: 245 mg/dL — ABNORMAL HIGH (ref 70–99)
Glucose-Capillary: 334 mg/dL — ABNORMAL HIGH (ref 70–99)

## 2021-04-02 MED ORDER — CYCLOBENZAPRINE HCL 10 MG PO TABS: 10.0000 mg | ORAL_TABLET | Freq: Three times a day (TID) | ORAL | 0 refills | Status: AC | PRN

## 2021-04-02 MED ORDER — HYDROMORPHONE HCL 2 MG PO TABS: 2.0000 mg | ORAL_TABLET | ORAL | 0 refills | Status: AC | PRN

## 2021-04-02 MED ORDER — ASPIRIN 81 MG PO CHEW: 81.0000 mg | CHEWABLE_TABLET | Freq: Two times a day (BID) | ORAL | 0 refills | Status: AC

## 2021-04-02 MED ORDER — INSULIN GLARGINE-YFGN 100 UNIT/ML ~~LOC~~ SOLN: 30.0000 [IU] | Freq: Every morning | SUBCUTANEOUS | 2 refills | Status: DC

## 2021-04-02 MED ORDER — POLYETHYLENE GLYCOL 3350 17 G PO PACK: 17.0000 g | PACK | Freq: Every day | ORAL | 0 refills | Status: AC | PRN

## 2021-04-02 MED ORDER — DOCUSATE SODIUM 100 MG PO CAPS: 100.0000 mg | ORAL_CAPSULE | Freq: Two times a day (BID) | ORAL | 0 refills | Status: AC

## 2021-04-02 MED ORDER — INSULIN ASPART 100 UNIT/ML IJ SOLN: 4.0000 [IU] | Freq: Three times a day (TID) | INTRAMUSCULAR | 2 refills | Status: DC

## 2021-04-02 MED ORDER — INSULIN GLARGINE-YFGN 100 UNIT/ML ~~LOC~~ SOLN
30.0000 [IU] | Freq: Every morning | SUBCUTANEOUS | Status: DC
  Administered 2021-04-02: 30 [IU] via SUBCUTANEOUS
  Filled 2021-04-02 (×2): qty 0.3

## 2021-04-02 MED ORDER — CEPHALEXIN 500 MG PO CAPS: 500.0000 mg | ORAL_CAPSULE | Freq: Three times a day (TID) | ORAL | 3 refills | Status: AC

## 2021-04-02 MED ORDER — ONDANSETRON HCL 4 MG PO TABS: 4.0000 mg | ORAL_TABLET | Freq: Four times a day (QID) | ORAL | 0 refills | Status: AC | PRN

## 2021-04-02 MED ORDER — SENNA 8.6 MG PO TABS: 2.0000 | ORAL_TABLET | Freq: Every day | ORAL | 0 refills | Status: AC

## 2021-04-02 NOTE — Progress Notes (Signed)
Inpatient Diabetes Program Recommendations ? ?AACE/ADA: New Consensus Statement on Inpatient Glycemic Control (2015) ? ?Target Ranges:  Prepandial:   less than 140 mg/dL ?     Peak postprandial:   less than 180 mg/dL (1-2 hours) ?     Critically ill patients:  140 - 180 mg/dL  ? ?Lab Results  ?Component Value Date  ? GLUCAP 302 (H) 04/02/2021  ? HGBA1C 6.5 (H) 03/25/2021  ? ? ?Review of Glycemic Control ? Latest Reference Range & Units 04/01/21 16:25 04/01/21 21:24 04/02/21 08:07  ?Glucose-Capillary 70 - 99 mg/dL 419 (H) 379 (H) 024 (H)  ?(H): Data is abnormally high ? ?Diabetes history: DM1 ?Outpatient Diabetes medications:  ?Insulin pump ?Medtronic 630G- manually boluses, does not use bolus wizard ?Reports using a 1:10 grams carb ratio ?ISF 1:40>120 (I unit drops glucose 40 mg/dl; uses target glucose of 120 mg/dl) ?Medtronic Pump settings: ?Basal:  ?0000-0400: 0.4 ?0400-0900: 2.1 ?0900-1200: 1.1 ?1200-2200: 1.1 ?2200-0000: 1.1 ?Total basal 2836 units/24hrs ?Current orders for Inpatient glycemic control: Semglee 23 units QD, Novolog 0-9 units TID and 0-5 QHS, decadron 4 mg on 3/8 at 1600 ? ?Inpatient Diabetes Program Recommendations:   ? ?Semglee 30 units QAM ? ?Will continue to follow while inpatient. ? ?Thank you, ?Dulce Sellar, MSN, RN ?Diabetes Coordinator ?Inpatient Diabetes Program ?[phone] (team pager from 8a-5p) ? ? ? ?

## 2021-04-02 NOTE — Progress Notes (Signed)
? ? ?Subjective: ? ?Patient reports pain as mild to moderate.  Denies N/V/CP/SOB. PO dilaudid is controlling pain well. ? ?Objective:  ? ?VITALS:   ?Vitals:  ? 04/02/21 0146 04/02/21 0627 04/02/21 0753 04/02/21 1050  ?BP: (!) 166/67 (!) 149/63  131/66  ?Pulse: (!) 101 95  (!) 110  ?Resp: 16 18  18   ?Temp: 98.8 ?F (37.1 ?C) 99.1 ?F (37.3 ?C)  98.6 ?F (37 ?C)  ?TempSrc: Oral Oral  Oral  ?SpO2: 100% 100% 97% 100%  ?Weight:      ?Height:      ? ? ?NAD ?ABD soft ?Sensation intact distally ?Intact pulses distally ?Dorsiflexion/Plantar flexion intact ?Incision: dressing C/D/I ?Compartment soft ?HV ss ? ? ?Lab Results  ?Component Value Date  ? WBC 7.9 04/02/2021  ? HGB 9.4 (L) 04/02/2021  ? HCT 29.1 (L) 04/02/2021  ? MCV 98.3 04/02/2021  ? PLT 143 (L) 04/02/2021  ? ?BMET ?   ?Component Value Date/Time  ? NA 135 04/01/2021 0319  ? K 4.2 04/01/2021 0319  ? CL 105 04/01/2021 0319  ? CO2 25 04/01/2021 0319  ? GLUCOSE 301 (H) 04/01/2021 0319  ? BUN 14 04/01/2021 0319  ? CREATININE 0.64 04/01/2021 0319  ? CREATININE 0.46 (L) 12/07/2020 1422  ? CALCIUM 8.8 (L) 04/01/2021 0319  ? CALCIUM 9.3 05/23/2011 0737  ? GFRNONAA >60 04/01/2021 0319  ? ? ?Recent Results (from the past 240 hour(s))  ?Surgical PCR Screen     Status: None  ? Collection Time: 03/25/21  9:45 AM  ? Specimen: Nasal Mucosa; Nasal Swab  ?Result Value Ref Range Status  ? MRSA, PCR NEGATIVE NEGATIVE Final  ? Staphylococcus aureus NEGATIVE NEGATIVE Final  ?  Comment: (NOTE) ?The Xpert SA Assay (FDA approved for NASAL specimens in patients 61 ?years of age and older), is one component of a comprehensive ?surveillance program. It is not intended to diagnose infection nor to ?guide or monitor treatment. ?Performed at [facility], [address] ?[place], [place] ?  ?Aerobic/Anaerobic Culture w Gram Stain (surgical/deep wound)     Status: None (Preliminary result)  ? Collection Time: 03/31/21  2:14 PM  ? Specimen: Soft Tissue, Other  ?Result  Value Ref Range Status  ? Specimen Description   Final  ?  TISSUE RIGHT KNEE ?Performed at [facility], [address] [place], [place] ?  ? Special Requests   Final  ?  NONE ?Performed at [facility], [address] [place], [place] ?  ? Gram Stain   Final  ?  NO SQUAMOUS EPITHELIAL CELLS SEEN ?FEW WBC SEEN ?NO ORGANISMS SEEN ?  ? Culture   Final  ?  NO GROWTH 2 DAYS ?Performed at [facility], [address], [place], [place] ?  ? Report Status PENDING  Incomplete  ? ? ? ? ?Assessment/Plan: ?2 Days Post-Op  ? ?Principal Problem: ?  Septic joint of right knee joint (HCC) ?Active Problems: ?  Septic arthritis of knee, right (HCC) ? ? ?WBAT with walker ?DVT ppx: Aspirin, SCDs, TEDS ?PO pain control: PO dilaudid and tylenol ?DM1: hyperglycemia, DM coordinator to make changes to insulin regimen, goal BS < 200, will need endocrine f/u ?PT/OT ?PO keflex ?D/C HV drain ?Intraop culture (3/8): gram stain negative, NGTD ?Dispo: D/C home with OPPT when blood sugar is under control ? ? ?07-25-1976 Molly [name] ?04/02/2021, 1:18 PM ? ? ?06/02/2021, MD ?[phone]  is now [facility]  [place] ?[address], [address], [place], [place] [address] ?Phone: [phone] ?[email or web address] ?Facebook  [employer], broadcasting/film/video  ?  ? ? ?

## 2021-04-02 NOTE — Discharge Summary (Signed)
Physician Discharge Summary  Patient ID: Molly [name] MRN: [id number] DOB/AGE: August 15, 1970 51 y.o.  Admit date: 03/31/2021 Discharge date: 04/04/2021  Admission Diagnoses:  Septic joint of right knee joint [facility])  Discharge Diagnoses:  Principal Problem:   Septic joint of right knee joint (HCC) Active Problems:   Septic arthritis of knee, right [facility])   Past Medical History:  Diagnosis Date   ALLERGIC RHINITIS CAUSE UNSPECIFIED 03/06/2007   Bipolar 1 disorder (HCC)    CONTUSION, ANKLE 08/28/2007   DEPRESSION 08/03/2006   DIABETES MELLITUS, TYPE I 08/03/2006   DM retinopathy (HCC)    Encounter for long-term (current) use of other medications    Hyperlipidemia    Hypertension    MIGRAINE, CHRONIC 03/06/2007   Osteoporosis    SEBACEOUS CYST, INFECTED 07/13/2007   SMOKER 08/11/2009   THROMBOCYTOPENIA 05/07/2008   TINEA VERSICOLOR 07/13/2007    Surgeries: Procedure(s): RESECTION OF RIGHT KNEE ARTICULATING SPACER REVISION OF TOTAL KNEE ARTHROPLASTY on 03/31/2021   Consultants (if any):   Discharged Condition: Improved  Hospital Course: Molly [name] is an 51 y.o. female who was admitted 03/31/2021 with a diagnosis of Septic joint of right knee joint (HCC) and went to the operating room on 03/31/2021 and underwent the above named procedures.    She was given perioperative antibiotics:  Anti-infectives (From admission, onward)    Start     Dose/Rate Route Frequency Ordered Stop   04/02/21 0000  cephALEXin (KEFLEX) 500 MG capsule        500 mg Oral Every 8 hours 04/02/21 1327 03/28/22 2359   04/01/21 1400  cephALEXin (KEFLEX) capsule 500 mg  Status:  Discontinued        500 mg Oral Every 8 hours 03/31/21 2252 04/04/21 1701   03/31/21 2330  ceFAZolin (ANCEF) IVPB 2g/100 mL premix        2 g 200 mL/hr over 30 Minutes Intravenous Every 6 hours 03/31/21 2252 04/01/21 0506   03/31/21 1245  ceFAZolin (ANCEF) IVPB 2g/100 mL premix        2 g 200 mL/hr over 30 Minutes Intravenous  On call to O.R. 03/31/21 1227 03/31/21 1630   03/31/21 1245  vancomycin (VANCOCIN) IVPB 1000 mg/200 mL premix        1,000 mg 200 mL/hr over 60 Minutes Intravenous On call to O.R. 03/31/21 1227 03/31/21 1609       She was given sequential compression devices, early ambulation, and ASA 81 mg PO BID for DVT prophylaxis.  Pain was not controlled well with PO oxycodone, so dilaudid was started with great improvement.  Blood sugars were running high, so the diabetes coordinator was called on POD#1. She stopped the insulin pump, and started Semglee and meal coverage with novolog.  She benefited maximally from the hospital stay and there were no complications.    Recent vital signs:  Vitals:   04/04/21 0607 04/04/21 0824  BP: (!) 157/59   Pulse: 97   Resp: 17   Temp: 99.3 F (37.4 C)   SpO2: 100% 99%    Recent laboratory studies:  Lab Results  Component Value Date   HGB 8.5 (L) 04/03/2021   HGB 9.4 (L) 04/02/2021   HGB 10.6 (L) 04/01/2021   Lab Results  Component Value Date   WBC 9.7 04/03/2021   PLT 165 04/03/2021   Lab Results  Component Value Date   INR 1.26 04/07/2014   Lab Results  Component Value Date   NA 135 04/01/2021   K 4.2  04/01/2021   CL 105 04/01/2021   CO2 25 04/01/2021   BUN 14 04/01/2021   CREATININE 0.64 04/01/2021   GLUCOSE 301 (H) 04/01/2021    Recent Results (from the past 240 hour(s))  Aerobic/Anaerobic Culture w Gram Stain (surgical/deep wound)     Status: None   Collection Time: 03/31/21  2:14 PM   Specimen: Soft Tissue, Other  Result Value Ref Range Status   Specimen Description   Final    TISSUE RIGHT KNEE Performed at [facility], [address], [place], [place] [address]    Special Requests   Final    NONE Performed at [facility], [address] [place], [place] [address]    Gram Stain   Final    NO SQUAMOUS EPITHELIAL CELLS SEEN FEW WBC SEEN NO ORGANISMS SEEN    Culture   Final     No growth aerobically or anaerobically. Performed at [facility], [address], [place], [place] [address]    Report Status 04/05/2021 FINAL  Final     Allergies as of 04/04/2021       Reactions   Varenicline Tartrate Rash   Chantix        Medication List     STOP taking these medications    Lyumjev 100 UNIT/ML Soln Generic drug: Insulin Lispro-aabc   MINIMED INFUSION SET-MMT 398 Misc   oxyCODONE-acetaminophen 5-325 MG tablet Commonly known as: PERCOCET/ROXICET   PARADIGM RESERVOIR Misc       TAKE these medications    Alcohol Prep 70 % Pads 8/day  250.01   amLODipine 5 MG tablet Commonly known as: NORVASC Take 10 mg by mouth daily.   aspirin 81 MG chewable tablet Chew 1 tablet (81 mg total) by mouth 2 (two) times daily with a meal.   beclomethasone 80 MCG/ACT inhaler Commonly known as: QVAR Inhale 2 puffs into the lungs 2 (two) times daily.   bismuth subsalicylate 262 MG/15ML suspension Commonly known as: PEPTO BISMOL Take 30 mLs by mouth every 6 (six) hours as needed for diarrhea or loose stools or indigestion.   CALCIUM 1200+D3 PO Take 1 tablet by mouth daily.   carbamazepine 200 MG tablet Commonly known as: TEGRETOL Take 1 tablet (200 mg total) by mouth every morning AND 5 tablets (1,000 mg total) at bedtime. What changed: See the new instructions.   cephALEXin 500 MG capsule Commonly known as: KEFLEX Take 1 capsule (500 mg total) by mouth every 8 (eight) hours.   cyclobenzaprine 10 MG tablet Commonly known as: FLEXERIL Take 1 tablet (10 mg total) by mouth 3 (three) times daily as needed for muscle spasms. What changed:  how much to take when to take this reasons to take this   docusate sodium 100 MG capsule Commonly known as: COLACE Take 1 capsule (100 mg total) by mouth 2 (two) times daily.   famotidine 20 MG tablet Commonly known as: PEPCID Take 20 mg by mouth daily as needed for heartburn or indigestion.   feeding  supplement (GLUCERNA SHAKE) Liqd Take 237 mLs by mouth 3 (three) times daily between meals. What changed: when to take this   freestyle lancets Use as instructed dx 250.01   glucose blood test strip Commonly known as: FREESTYLE LITE 8/day dx 250.01, and lancets   HYDROmorphone 2 MG tablet Commonly known as: DILAUDID Take 1 tablet (2 mg total) by mouth every 4 (four) hours as needed for moderate pain.   ibuprofen 200 MG  tablet Commonly known as: ADVIL Take 1,000 mg by mouth 3 (three) times daily as needed for moderate pain.   insulin aspart 100 UNIT/ML injection Commonly known as: novoLOG Inject 7 Units into the skin 3 (three) times daily with meals.   insulin glargine-yfgn 100 UNIT/ML injection Commonly known as: SEMGLEE Inject 0.33 mLs (33 Units total) into the skin every morning.   lamoTRIgine 200 MG tablet Commonly known as: LAMICTAL Take 400 mg by mouth daily.   lithium carbonate 300 MG CR tablet Commonly known as: LITHOBID Take 300 mg by mouth daily after supper.   loratadine-pseudoephedrine 10-240 MG 24 hr tablet Commonly known as: CLARITIN-D 24-hour Take 1 tablet by mouth daily.   multivitamin tablet Take 1 tablet by mouth daily.   Nurtec 75 MG Tbdp Generic drug: Rimegepant Sulfate Take 75 mg by mouth daily as needed (migraines).   ondansetron 4 MG tablet Commonly known as: ZOFRAN Take 1 tablet (4 mg total) by mouth every 6 (six) hours as needed for nausea.   polyethylene glycol 17 g packet Commonly known as: MIRALAX / GLYCOLAX Take 17 g by mouth daily as needed for mild constipation. What changed:  when to take this reasons to take this   POTASSIUM PO Take 650 mg by mouth daily.   promethazine 25 MG tablet Commonly known as: PHENERGAN Take 25 mg by mouth every 6 (six) hours as needed for nausea or vomiting.   rosuvastatin 40 MG tablet Commonly known as: CRESTOR TAKE 1 TABLET BY MOUTH DAILY GENERIC EQUIVALENT FOR CRESTOR What changed:  how  much to take how to take this when to take this additional instructions   senna 8.6 MG Tabs tablet Commonly known as: SENOKOT Take 2 tablets (17.2 mg total) by mouth at bedtime.   ziprasidone 60 MG capsule Commonly known as: GEODON Take 120 mg by mouth daily after supper.   zolpidem 10 MG tablet Commonly known as: AMBIEN Take 2 tablets (20 mg total) by mouth at bedtime.          WEIGHT BEARING   Weight bearing as tolerated with assist device (walker, cane, etc) as directed, use it as long as suggested by your surgeon or therapist, typically at least 4-6 weeks.   EXERCISES  Results after joint replacement surgery are often greatly improved when you follow the exercise, range of motion and muscle strengthening exercises prescribed by your doctor. Safety measures are also important to protect the joint from further injury. Any time any of these exercises cause you to have increased pain or swelling, decrease what you are doing until you are comfortable again and then slowly increase them. If you have problems or questions, call your caregiver or physical therapist for advice.   Rehabilitation is important following a joint replacement. After just a few days of immobilization, the muscles of the leg can become weakened and shrink (atrophy).  These exercises are designed to build up the tone and strength of the thigh and leg muscles and to improve motion. Often times heat used for twenty to thirty minutes before working out will loosen up your tissues and help with improving the range of motion but do not use heat for the first two weeks following surgery (sometimes heat can increase post-operative swelling).   These exercises can be done on a training (exercise) mat, on the floor, on a table or on a bed. Use whatever works the best and is most comfortable for you.    Use music or television while you  are exercising so that the exercises are a pleasant break in your day. This will make  your life better with the exercises acting as a break in your routine that you can look forward to.   Perform all exercises about fifteen times, three times per day or as directed.  You should exercise both the operative leg and the other leg as well.  Exercises include:   Quad Sets - Tighten up the muscle on the front of the thigh (Quad) and hold for 5-10 seconds.   Straight Leg Raises - With your knee straight (if you were given a brace, keep it on), lift the leg to 60 degrees, hold for 3 seconds, and slowly lower the leg.  Perform this exercise against resistance later as your leg gets stronger.  Leg Slides: Lying on your back, slowly slide your foot toward your buttocks, bending your knee up off the floor (only go as far as is comfortable). Then slowly slide your foot back down until your leg is flat on the floor again.  Angel Wings: Lying on your back spread your legs to the side as far apart as you can without causing discomfort.  Hamstring Strength:  Lying on your back, push your heel against the floor with your leg straight by tightening up the muscles of your buttocks.  Repeat, but this time bend your knee to a comfortable angle, and push your heel against the floor.  You may put a pillow under the heel to make it more comfortable if necessary.   A rehabilitation program following joint replacement surgery can speed recovery and prevent re-injury in the future due to weakened muscles. Contact your doctor or a physical therapist for more information on knee rehabilitation.    CONSTIPATION  Constipation is defined medically as fewer than three stools per week and severe constipation as less than one stool per week.  Even if you have a regular bowel pattern at home, your normal regimen is likely to be disrupted due to multiple reasons following surgery.  Combination of anesthesia, postoperative narcotics, change in appetite and fluid intake all can affect your bowels.   YOU MUST use at least  one of the following options; they are listed in order of increasing strength to get the job done.  They are all available over the counter, and you may need to use some, POSSIBLY even all of these options:    Drink plenty of fluids (prune juice may be helpful) and high fiber foods Colace 100 mg by mouth twice a day  Senokot for constipation as directed and as needed Dulcolax (bisacodyl), take with full glass of water  Miralax (polyethylene glycol) once or twice a day as needed.  If you have tried all these things and are unable to have a bowel movement in the first 3-4 days after surgery call either your surgeon or your primary doctor.    If you experience loose stools or diarrhea, hold the medications until you stool forms back up.  If your symptoms do not get better within 1 week or if they get worse, check with your doctor.  If you experience "the worst abdominal pain ever" or develop nausea or vomiting, please contact the office immediately for further recommendations for treatment.   ITCHING:  If you experience itching with your medications, try taking only a single pain pill, or even half a pain pill at a time.  You can also use Benadryl over the counter for itching or also to help  with sleep.   TED HOSE STOCKINGS:  Use stockings on both legs until for at least 2 weeks or as directed by physician office. They may be removed at night for sleeping.  MEDICATIONS:  See your medication summary on the After Visit Summary that nursing will review with you.  You may have some home medications which will be placed on hold until you complete the course of blood thinner medication.  It is important for you to complete the blood thinner medication as prescribed.  PRECAUTIONS:  If you experience chest pain or shortness of breath - call 911 immediately for transfer to the hospital emergency department.   If you develop a fever greater that 101 F, purulent drainage from wound, increased redness or  drainage from wound, foul odor from the wound/dressing, or calf pain - CONTACT YOUR SURGEON.                                                   FOLLOW-UP APPOINTMENTS:  If you do not already have a post-op appointment, please call the office for an appointment to be seen by your surgeon.  Guidelines for how soon to be seen are listed in your After Visit Summary, but are typically between 1-4 weeks after surgery.  OTHER INSTRUCTIONS:   Knee Replacement:  Do not place pillow under knee, focus on keeping the knee straight while resting. CPM instructions: 0-90 degrees, 2 hours in the morning, 2 hours in the afternoon, and 2 hours in the evening. Place foam block, curve side up under heel at all times except when in CPM or when walking.  DO NOT modify, tear, cut, or change the foam block in any way.   MAKE SURE YOU:  Understand these instructions.  Get help right away if you are not doing well or get worse.    Thank you for letting us be a part of your medical care team.  It is a privilege we respect greatly.  We hope these instructions will help you stay on track for a fast and full recovery!   Diagnostic Studies: DG Knee Right Port  Result Date: 03/31/2021 CLINICAL DATA:  Status post right knee replacement. EXAM: PORTABLE RIGHT KNEE - 1-2 VIEW COMPARISON:  None. FINDINGS: Right knee arthroplasty in expected alignment. No periprosthetic lucency or fracture. There has been patellar resurfacing. Recent postsurgical change includes air and edema in the soft tissues and joint space. Anterior skin staples. IMPRESSION: Right knee arthroplasty without immediate postoperative complication. Electronically Signed   By: Narda Rutherford M.D.   On: 03/31/2021 22:45    Disposition: Discharge disposition: 01-Home or Self Care       Discharge Instructions     Call MD / Call 911   Complete by: As directed    If you experience chest pain or shortness of breath, CALL 911 and be transported to the hospital  emergency room.  If you develope a fever above 101 F, pus (white drainage) or increased drainage or redness at the wound, or calf pain, call your surgeon's office.   Constipation Prevention   Complete by: As directed    Drink plenty of fluids.  Prune juice may be helpful.  You may use a stool softener, such as Colace (over the counter) 100 mg twice a day.  Use MiraLax (over the counter) for constipation as needed.  Diet - low sodium heart healthy   Complete by: As directed    Increase activity slowly as tolerated   Complete by: As directed    Post-operative opioid taper instructions:   Complete by: As directed    POST-OPERATIVE OPIOID TAPER INSTRUCTIONS: It is important to wean off of your opioid medication as soon as possible. If you do not need pain medication after your surgery it is ok to stop day one. Opioids include: Codeine, Hydrocodone(Norco, Vicodin), Oxycodone(Percocet, oxycontin) and hydromorphone amongst others.  Long term and even short term use of opiods can cause: Increased pain response Dependence Constipation Depression Respiratory depression And more.  Withdrawal symptoms can include Flu like symptoms Nausea, vomiting And more Techniques to manage these symptoms Hydrate well Eat regular healthy meals Stay active Use relaxation techniques(deep breathing, meditating, yoga) Do Not substitute Alcohol to help with tapering If you have been on opioids for less than two weeks and do not have pain than it is ok to stop all together.  Plan to wean off of opioids This plan should start within one week post op of your joint replacement. Maintain the same interval or time between taking each dose and first decrease the dose.  Cut the total daily intake of opioids by one tablet each day Next start to increase the time between doses. The last dose that should be eliminated is the evening dose.           Follow-up Information     Welles Walthall, Arlys John, MD. Schedule an  appointment as soon as possible for a visit in 2 week(s).   Specialty: Orthopedic Surgery Why: For wound re-check, For suture removal Contact information: [address] [place] [address] [phone]                  Signed: Iline Oven Manning Luna 04/07/2021, 6:58 AM

## 2021-04-02 NOTE — Plan of Care (Signed)

## 2021-04-02 NOTE — Plan of Care (Signed)
  Problem: Activity: Goal: Risk for activity intolerance will decrease Outcome: Progressing   Problem: Pain Managment: Goal: General experience of comfort will improve Outcome: Progressing   Problem: Safety: Goal: Ability to remain free from injury will improve Outcome: Progressing   

## 2021-04-02 NOTE — Progress Notes (Signed)
Physical Therapy Treatment ?Patient Details ?Name: Molly [name] ?MRN: [id number] ?DOB: 1970/11/14 ?Today's Date: 04/02/2021 ? ? ?   ? ?  ?PT Comments  ? ? Pt is POD #2 s/p /p reimplantation right total knee arthroplasty and resection of right knee articulating spacer resulting in the deficits listed below (see PT Problem List). Pt performed sit to stand transfers, ambulation, and stair negotiation with supervision and no overt LOB observed. Pt ambulated total of ~153ft during session with supervision for safety, decreased WB noted through R LE and pt ambulating on toes/R forefoot with cuing for increased DF and WB through R, mild improvement noted with increased distance. PT reviewed LE HEP for promotion of DVT prevention and improved strength/ROM, pt demonstrated understanding. Pt will have assist from her son and mother upon d/c. Pt is currently at adequate mobility level for d/c to home with family assist and will benefit from continued skilled PT to maximize safety with functional mobility and increase independence.  ?   ?Recommendations for follow up therapy are one component of a multi-disciplinary discharge planning process, led by the attending physician.  Recommendations may be updated based on patient status, additional functional criteria and insurance authorization. ? ? ?   ?  ?  ? ? 04/02/21 0900  ?PT Visit Information  ?Last PT Received On 04/02/21  ?Assistance Needed +1  ?History of Present Illness Pt is a 51 y.o. female s/p Reimplantation right total knee arthroplasty and resection of right knee articulating spacer. PMH significant for HTN, HLD, bipolar disorder, depression, DM, and R TKA.  ?Subjective Data  ?Subjective sleepy today  ?Patient Stated Goal decrease pain  ?Precautions  ?Precautions Fall  ?Restrictions  ?Weight Bearing Restrictions Yes ?  ?RLE Weight Bearing WBAT ?  ?Other Position/Activity Restrictions drain R LE  ?Pain Assessment  ?Pain Assessment 0-10  ?Pain Score 4  ?Pain Location  Rt knee  ?Pain Descriptors / Indicators Aching;Sore;Tender;Discomfort  ?Pain Intervention(s) Limited activity within patient's tolerance;Monitored during session;Repositioned;Ice applied  ?Cognition  ?Arousal/Alertness Awake/alert  ?Behavior During Therapy Flat affect  ?Overall Cognitive Status Within Functional Limits for tasks assessed  ?Bed Mobility  ?Overal bed mobility Needs Assistance  ?Bed Mobility Supine to Sit  ?Supine to sit Supervision;HOB elevated  ?General bed mobility comments HOB 20deg  ?Transfers  ?Overall transfer level Needs assistance  ?Equipment used Rolling walker (2 wheels)  ?Transfers Sit to/from Stand  ?Sit to Stand Supervision  ?General transfer comment x1 from EOB, x1 from toilet; x2 from chair; cues for safe hand placement  ?Ambulation/Gait  ?Ambulation/Gait assistance Supervision  ?Gait Distance (Feet) 20 Feet  ?Assistive device Rolling walker (2 wheels)  ?Gait Pattern/deviations Step-to pattern;Decreased stride length;Decreased weight shift to right;Decreased dorsiflexion - right;Knee flexed in stance - right  ?General Gait Details Additional 44ft from bathroom to EOB for seated rest and then 36ft in hallway. Pt walking on R forefoot/toes with decreased WB on R, cues for foot flat and increased WB through R LE with mild improvements observed.  no overt LOB observed.  ?Gait velocity decr  ?Stairs Yes  ?Stairs assistance Supervision  ?Stair Management Two rails;Step to pattern;Forwards  ?Number of Stairs 5  ?General stair comments Pt demonstrating good recall of proper sequencing of stairs from prior history of knee issues/procedures. Supervision for safety and education on proper positioning for family members when assisting at home. Pt reporting feeling dizzy/lightheaded following stair negotiation, assisted to seated. BP 129/67, HR 120bpm. Pt states she feels this way when her blood sugar  is elevated, RN reporting blood glucose was elevated this morning, RN notified and in to assess and  administer medications. Pt reported improvement in symptoms at end of session.  ?Balance  ?Overall balance assessment Needs assistance  ?Sitting-balance support Feet supported  ?Sitting balance-Leahy Scale Good  ?Standing balance support Bilateral upper extremity supported;During functional activity;No upper extremity supported  ?Standing balance-Leahy Scale Fair  ?Exercises  ?Exercises Total Joint  ?Total Joint Exercises  ?Ankle Circles/Pumps AROM;Both;20 reps;Seated  ?Quad Sets AROM;Right;10 reps;Seated  ?Heel Slides AROM;Right;10 reps;Seated  ?Short Arc [place];Right;10 reps;Seated  ?Straight Leg Raises AAROM;Right;10 reps;Seated  ?Hip ABduction/ADduction AROM;Right;10 reps;Seated  ?PT - End of Session  ?Equipment Utilized During Treatment Gait belt  ?Activity Tolerance Patient tolerated treatment well;Treatment limited secondary to medical complications (Comment) ?(lightheadedness)  ?Patient left in chair;with call bell/phone within reach  ?Nurse Communication Mobility status  ? PT - Assessment/Plan  ?PT Plan Current plan remains appropriate  ?PT Visit Diagnosis Unsteadiness on feet (R26.81);Muscle weakness (generalized) (M62.81);Pain  ?Pain - Right/Left Right  ?Pain - part of body Knee  ?PT Frequency (ACUTE ONLY) 7X/week  ?Follow Up Recommendations Follow physician's recommendations for discharge plan and follow up therapies  ?Assistance recommended at discharge Intermittent Supervision/Assistance  ?PT equipment None recommended by PT ?(pt owns RW)  ?AM-PAC PT "6 Clicks" Mobility Outcome Measure (Version 2)  ?Help needed turning from your back to your side while in a flat bed without using bedrails? 3  ?Help needed moving from lying on your back to sitting on the side of a flat bed without using bedrails? 3  ?Help needed moving to and from a bed to a chair (including a wheelchair)? 3  ?Help needed standing up from a chair using your arms (e.g., wheelchair or bedside chair)? 3  ?Help needed to walk in  hospital room? 3  ?Help needed climbing 3-5 steps with a railing?  3  ?6 Click Score 18  ?Consider Recommendation of Discharge To: Home with HH  ?Progressive Mobility  ?What is the highest level of mobility based on the progressive mobility assessment? Level 5 (Walks with assist in room/hall) - Balance while stepping forward/back and can walk in room with assist - Complete  ?Activity Ambulated with assistance in hallway;Ambulated with assistance to bathroom  ?PT Goal Progression  ?Progress towards PT goals Progressing toward goals  ?Acute Rehab PT Goals  ?PT Goal Formulation With patient  ?Time For Goal Achievement 04/15/21  ?Potential to Achieve Goals Good  ?PT Time Calculation  ?PT Start Time (ACUTE ONLY) 0957  ?PT Stop Time (ACUTE ONLY) 1027  ?PT Time Calculation (min) (ACUTE ONLY) 30 min  ?PT General Charges  ?$$ ACUTE PT VISIT 1 Visit  ?PT Treatments  ?$Gait Training 8-22 mins  ?$Therapeutic Exercise 8-22 mins  ?       ?          ? ?Lyman Speller., PT, DPT  ?Acute Rehabilitation Services  ?Office [phone] ? ?04/02/2021, 1:31 PM ? ?

## 2021-04-03 LAB — CBC
HCT: 26.7 % — ABNORMAL LOW (ref 36.0–46.0)
Hemoglobin: 8.5 g/dL — ABNORMAL LOW (ref 12.0–15.0)
MCH: 31.5 pg (ref 26.0–34.0)
MCHC: 31.8 g/dL (ref 30.0–36.0)
MCV: 98.9 fL (ref 80.0–100.0)
Platelets: 165 10*3/uL (ref 150–400)
RBC: 2.7 MIL/uL — ABNORMAL LOW (ref 3.87–5.11)
RDW: 12.1 % (ref 11.5–15.5)
WBC: 9.7 10*3/uL (ref 4.0–10.5)
nRBC: 0 % (ref 0.0–0.2)

## 2021-04-03 LAB — GLUCOSE, CAPILLARY
Glucose-Capillary: 312 mg/dL — ABNORMAL HIGH (ref 70–99)
Glucose-Capillary: 283 mg/dL — ABNORMAL HIGH (ref 70–99)
Glucose-Capillary: 224 mg/dL — ABNORMAL HIGH (ref 70–99)
Glucose-Capillary: 105 mg/dL — ABNORMAL HIGH (ref 70–99)

## 2021-04-03 MED ORDER — INSULIN ASPART 100 UNIT/ML IJ SOLN
7.0000 [IU] | Freq: Three times a day (TID) | INTRAMUSCULAR | Status: DC
  Administered 2021-04-03 – 2021-04-04 (×3): 7 [IU] via SUBCUTANEOUS

## 2021-04-03 MED ORDER — ROSUVASTATIN CALCIUM 20 MG PO TABS
40.0000 mg | ORAL_TABLET | Freq: Every day | ORAL | Status: DC
Start: 2021-04-04 — End: 2021-04-04

## 2021-04-03 MED ORDER — INSULIN GLARGINE-YFGN 100 UNIT/ML ~~LOC~~ SOLN
33.0000 [IU] | Freq: Every morning | SUBCUTANEOUS | Status: DC
  Administered 2021-04-03 – 2021-04-04 (×2): 33 [IU] via SUBCUTANEOUS
  Filled 2021-04-03 (×2): qty 0.33

## 2021-04-03 NOTE — Progress Notes (Addendum)
Inpatient Diabetes Program Recommendations ? ?AACE/ADA: New Consensus Statement on Inpatient Glycemic Control  ? ?Target Ranges:  Prepandial:   less than 140 mg/dL ?     Peak postprandial:   less than 180 mg/dL (1-2 hours) ?     Critically ill patients:  140 - 180 mg/dL  ? ? Latest Reference Range & Units 04/02/21 08:07 04/02/21 11:37 04/02/21 14:23 04/02/21 16:42 04/02/21 21:54  ?Glucose-Capillary 70 - 99 mg/dL 284 (H) 132 (H) 440 (H) 245 (H) 334 (H)  ? ?Review of Glycemic Control ? ?Diabetes history: DM1 ?Outpatient Diabetes medications: Medtronic 630G insulin pump- manually boluses, does not use bolus wizard ?Reports using a 1:10 grams carb ratio ?ISF 1:40>120 (I unit drops glucose 40 mg/dl; uses target glucose of 120 mg/dl) ?Medtronic Pump settings: ?Basal:  ?0000-0400: 0.4 ?0400-0900: 2.1 ?0900-1200: 1.1 ?1200-2200: 1.1 ?2200-0000: 1.1 ?Total basal: 28.6 units/day ?Current orders for Inpatient glycemic control: Semglee 30 units QAM, Novolog 0-15 units TID with melas, Novolog 0-5 units QHS, Novolog 4 units TID with meals ? ?Inpatient Diabetes Program Recommendations:   ? ?Insulin: Please consider increasing Semglee to 33 units daily and meal coverage insulin to Novolog 7 units TID with meals.  ? ? ?NOTE: Noted consult for Diabetes Coordinator. Diabetes Coordinator is not on campus over the weekend but available by pager from 8am to 5pm for questions or concerns.  ? ?Thanks, ?Orlando Penner, RN, MSN, CDE ?Diabetes Coordinator ?Inpatient Diabetes Program ?[phone] (Team Pager from 8am to 5pm) ? ? ? ? ? ? ?

## 2021-04-03 NOTE — Progress Notes (Signed)
Physical Therapy Treatment ?Patient Details ?Name: Molly [name] ?MRN: [id number] ?DOB: 03-18-1970 ?Today's Date: 04/03/2021 ? ? ?History of Present Illness Pt is a 51 y.o. female s/p Reimplantation right total knee arthroplasty and resection of right knee articulating spacer. PMH significant for HTN, HLD, bipolar disorder, depression, DM, and R TKA. ? ?  ?PT Comments  ? ? Pt continues to participate well. She feels comfortable with stair training she completed yesterday and denies need to practice again. She tolerated increased ambulation distance well. She continues to have some dizziness which she attributes to her blood sugar levels. Will continue to follow and progress activity as tolerated.  ?   ?Recommendations for follow up therapy are one component of a multi-disciplinary discharge planning process, led by the attending physician.  Recommendations may be updated based on patient status, additional functional criteria and insurance authorization. ? ?Follow Up Recommendations ? Follow physician's recommendations for discharge plan and follow up therapies (plan is for OPPT) ?  ?  ?Assistance Recommended at Discharge Intermittent Supervision/Assistance  ?Patient can return home with the following A little help with walking and/or transfers;A little help with bathing/dressing/bathroom;Assistance with cooking/housework;Assist for transportation;Help with stairs or ramp for entrance ?  ?Equipment Recommendations ? None recommended by PT  ?  ?Recommendations for Other Services   ? ? ?  ?Precautions / Restrictions Precautions ?Precautions: Fall ?Restrictions ?Weight Bearing Restrictions: No ?RLE Weight Bearing: Weight bearing as tolerated  ?  ? ?Mobility ? Bed Mobility ?Overal bed mobility: Needs Assistance ?Bed Mobility: Supine to Sit, Sit to Supine ?  ?  ?Supine to sit: Supervision, HOB elevated ?Sit to supine: Min assist, HOB elevated ?  ?  ?  ? ?Transfers ?Overall transfer level: Needs assistance ?Equipment  used: Rolling walker (2 wheels) ?Transfers: Sit to/from Stand ?Sit to Stand: Supervision ?  ?  ?  ?  ?  ?General transfer comment: Supv for safety. Pt prefers to use her rollator. ?  ? ?Ambulation/Gait ?Ambulation/Gait assistance: Supervision ?Gait Distance (Feet): 75 Feet ?Assistive device: Rollator (4 wheels) ?Gait Pattern/deviations: Step-through pattern, Decreased stride length ?  ?  ?  ?General Gait Details: Pt prefers to use her rollator. HR up to 135 bpm, dyspena 2/4. O2 >90% ? ? ?Stairs ?  ?  ?  ?  ?  ? ? ?Wheelchair Mobility ?  ? ?Modified Rankin (Stroke Patients Only) ?  ? ? ?  ?Balance Overall balance assessment: Needs assistance ?  ?  ?  ?  ?Standing balance support: Bilateral upper extremity supported, During functional activity, No upper extremity supported ?Standing balance-Leahy Scale: Fair ?  ?  ?  ?  ?  ?  ?  ?  ?  ?  ?  ?  ?  ? ?  ?Cognition Arousal/Alertness: Awake/alert ?Behavior During Therapy: Flat affect ?Overall Cognitive Status: Within Functional Limits for tasks assessed ?  ?  ?  ?  ?  ?  ?  ?  ?  ?  ?  ?  ?  ?  ?  ?  ?  ?  ?  ? ?  ?Exercises Total Joint Exercises ?Ankle Circles/Pumps: AROM, Both, 10 reps ?Quad Sets: AROM, Both, 10 reps ?Hip ABduction/ADduction: AROM, AAROM, Right, 10 reps ?Straight Leg Raises: AAROM, Right, 10 reps ?Goniometric ROM: ~10-65 degrees ? ?  ?General Comments   ?  ?  ? ?Pertinent Vitals/Pain Pain Assessment ?Pain Assessment: Faces ?Faces Pain Scale: Hurts even more ?Pain Location: R knee ?Pain Descriptors / Indicators: Discomfort,  Sore, Aching ?Pain Intervention(s): Limited activity within patient's tolerance, Monitored during session, Repositioned  ? ? ?Home Living   ?  ?  ?  ?  ?  ?  ?  ?  ?  ?   ?  ?Prior Function    ?  ?  ?   ? ?PT Goals (current goals can now be found in the care plan section) Progress towards PT goals: Progressing toward goals ? ?  ?Frequency ? ? ? 7X/week ? ? ? ?  ?PT Plan Current plan remains appropriate  ? ? ?Co-evaluation   ?  ?  ?  ?   ? ?  ?AM-PAC PT "6 Clicks" Mobility   ?Outcome Measure ? Help needed turning from your back to your side while in a flat bed without using bedrails?: A Little ?Help needed moving from lying on your back to sitting on the side of a flat bed without using bedrails?: A Little ?Help needed moving to and from a bed to a chair (including a wheelchair)?: A Little ?Help needed standing up from a chair using your arms (e.g., wheelchair or bedside chair)?: A Little ?Help needed to walk in hospital room?: A Little ?Help needed climbing 3-5 steps with a railing? : A Little ?6 Click Score: 18 ? ?  ?End of Session Equipment Utilized During Treatment: Gait belt ?Activity Tolerance: Patient tolerated treatment well ?Patient left: in bed;with call bell/phone within reach ?  ?PT Visit Diagnosis: Pain;Other abnormalities of gait and mobility (R26.89) ?Pain - Right/Left: Right ?Pain - part of body: Knee ?  ? ? ?Time: 1029-1050 ?PT Time Calculation (min) (ACUTE ONLY): 21 min ? ?Charges:  $Gait Training: 8-22 mins          ?          ? ? ? ? ? ?Faye Ramsay, PT ?Acute Rehabilitation  ?Office: [phone] ?Pager: [phone] ? ?  ? ?

## 2021-04-03 NOTE — Progress Notes (Signed)
Subjective: ?3 Days Post-Op Procedure(s) (LRB): ?RESECTION OF RIGHT KNEE ARTICULATING SPACER REVISION OF TOTAL KNEE ARTHROPLASTY (Right) ? ?Patient reports pain as mild to moderate.  Tolerating POs well.  Admits to flatus.  Denies fever, chills, N/V, CP, SOB.  Notes that she has worked well with therapy. ? ?Objective:  ? ?VITALS:  Temp:  [98.6 ?F (37 ?C)-99.6 ?F (37.6 ?C)] 99.6 ?F (37.6 ?C) (03/10 1917) ?Pulse Rate:  [104-110] 104 (03/11 0605) ?Resp:  [16-20] 16 (03/11 7253) ?BP: (131-162)/(54-66) 142/66 (03/11 6644) ?SpO2:  [97 %-100 %] 100 % (03/11 0605) ? ?General: WDWN patient in NAD. ?Psych:  Appropriate mood and affect. ?Neuro:  A&O x 3, Moving all extremities, sensation intact to light touch ?HEENT:  EOMs intact ?Chest:  Even non-labored respirations ?Skin:  Dressing C/D/I, no rashes or lesions ?Extremities: warm/dry, mild edema to R knee, no erythema or echymosis.  No lymphadenopathy. ?Pulses: Femoral 2+ ?MSK:  ROM: lacks 5 degrees TKE, MMT: able to perform quad set ? ? ?LABS ?Recent Labs  ?  04/01/21 ?0319 04/02/21 ?0322 04/03/21 ?0312  ?HGB 10.6* 9.4* 8.5*  ?WBC 8.4 7.9 9.7  ?PLT 136* 143* 165  ? ?Recent Labs  ?  04/01/21 ?0319  ?NA 135  ?K 4.2  ?CL 105  ?CO2 25  ?BUN 14  ?CREATININE 0.64  ?GLUCOSE 301*  ? ?No results for input(s): LABPT, INR in the last 72 hours. ? ? ?Assessment/Plan: ?3 Days Post-Op Procedure(s) (LRB): ?RESECTION OF RIGHT KNEE ARTICULATING SPACER REVISION OF TOTAL KNEE ARTHROPLASTY (Right) ? ?Patient seen in rounds for Dr. Linna Caprice. ?Semglee increased to 33 units daily and novolog 7 units TID with meals per DM coordinator recommendation. ?Goal BS < 200 ?Up with therapy ?PO keflex ?DVT ppx: ASA, SCDs, TED ?D/C home with OPPT when blood sugar is under control. ? ?Alfredo Martinez PA-C ?[facility] ?Office:  [phone]  ?

## 2021-04-03 NOTE — Progress Notes (Signed)
Physical Therapy Treatment ?Patient Details ?Name: Molly [name] ?MRN: [id number] ?DOB: 08/25/1970 ?Today's Date: 04/03/2021 ? ? ?History of Present Illness Pt is a 51 y.o. female s/p Reimplantation right total knee arthroplasty and resection of right knee articulating spacer. PMH significant for HTN, HLD, bipolar disorder, depression, DM, and R TKA. ? ?  ?PT Comments  ? ? Pt continues to progress well. She is waiting on stabilization of her blood sugar levels before d/c.    ?Recommendations for follow up therapy are one component of a multi-disciplinary discharge planning process, led by the attending physician.  Recommendations may be updated based on patient status, additional functional criteria and insurance authorization. ? ?Follow Up Recommendations ? Follow physician's recommendations for discharge plan and follow up therapies (plan is for OPPT) ?  ?  ?Assistance Recommended at Discharge Intermittent Supervision/Assistance  ?Patient can return home with the following A little help with walking and/or transfers;A little help with bathing/dressing/bathroom;Assistance with cooking/housework;Assist for transportation;Help with stairs or ramp for entrance ?  ?Equipment Recommendations ? None recommended by PT  ?  ?Recommendations for Other Services   ? ? ?  ?Precautions / Restrictions Precautions ?Precautions: Fall ?Restrictions ?Weight Bearing Restrictions: No ?RLE Weight Bearing: Weight bearing as tolerated  ?  ? ?Mobility ? Bed Mobility ?Overal bed mobility: Needs Assistance ?Bed Mobility: Supine to Sit, Sit to Supine ?  ?  ?Supine to sit: Supervision, HOB elevated ?Sit to supine: Min assist, HOB elevated ?  ?  ?  ? ?Transfers ?Overall transfer level: Needs assistance ?Equipment used: Rolling walker (2 wheels) ?Transfers: Sit to/from Stand ?Sit to Stand: Supervision ?  ?  ?  ?  ?  ?General transfer comment: Supv for safety. Pt prefers to use her rollator. ?  ? ?Ambulation/Gait ?Ambulation/Gait assistance:  Supervision ?Gait Distance (Feet): 75 Feet ?Assistive device: Rollator (4 wheels) ?Gait Pattern/deviations: Step-through pattern, Decreased stride length ?  ?  ?  ?General Gait Details: Pt prefers to use her rollator. ? ? ?Stairs ?  ?  ?  ?  ?  ? ? ?Wheelchair Mobility ?  ? ?Modified Rankin (Stroke Patients Only) ?  ? ? ?  ?Balance Overall balance assessment: Needs assistance ?  ?  ?  ?  ?Standing balance support: Bilateral upper extremity supported, During functional activity, No upper extremity supported ?Standing balance-Leahy Scale: Fair ?  ?  ?  ?  ?  ?  ?  ?  ?  ?  ?  ?  ?  ? ?  ?Cognition Arousal/Alertness: Awake/alert ?Behavior During Therapy: Flat affect ?Overall Cognitive Status: Within Functional Limits for tasks assessed ?  ?  ?  ?  ?  ?  ?  ?  ?  ?  ?  ?  ?  ?  ?  ?  ?  ?  ?  ? ?  ?Exercises  ? ?  ?General Comments   ?  ?  ? ?Pertinent Vitals/Pain Pain Assessment ?Pain Assessment: Faces ?Faces Pain Scale: Hurts little more ?Pain Location: R knee ?Pain Descriptors / Indicators: Discomfort, Sore, Aching ?Pain Intervention(s): Monitored during session, Repositioned  ? ? ?Home Living   ?  ?  ?  ?  ?  ?  ?  ?  ?  ?   ?  ?Prior Function    ?  ?  ?   ? ?PT Goals (current goals can now be found in the care plan section) Progress towards PT goals: Progressing toward goals ? ?  ?  Frequency ? ? ? 7X/week ? ? ? ?  ?PT Plan Current plan remains appropriate  ? ? ?Co-evaluation   ?  ?  ?  ?  ? ?  ?AM-PAC PT "6 Clicks" Mobility   ?Outcome Measure ? Help needed turning from your back to your side while in a flat bed without using bedrails?: A Little ?Help needed moving from lying on your back to sitting on the side of a flat bed without using bedrails?: A Little ?Help needed moving to and from a bed to a chair (including a wheelchair)?: A Little ?Help needed standing up from a chair using your arms (e.g., wheelchair or bedside chair)?: A Little ?Help needed to walk in hospital room?: A Little ?Help needed climbing 3-5  steps with a railing? : A Little ?6 Click Score: 18 ? ?  ?End of Session Equipment Utilized During Treatment: Gait belt ?Activity Tolerance: Patient tolerated treatment well ?Patient left: in bed;with call bell/phone within reach ?  ?PT Visit Diagnosis: Pain;Other abnormalities of gait and mobility (R26.89) ?Pain - Right/Left: Right ?Pain - part of body: Knee ?  ? ? ?Time: [id number] ?PT Time Calculation (min) (ACUTE ONLY): 12 min ? ?Charges:  $Gait Training: 8-22 mins          ?          ? ? ? ? ?Doreatha Massed, PT ?Acute Rehabilitation  ?Office: [phone] ?Pager: [phone] ? ?  ? ?

## 2021-04-04 LAB — GLUCOSE, CAPILLARY
Glucose-Capillary: 100 mg/dL — ABNORMAL HIGH (ref 70–99)
Glucose-Capillary: 115 mg/dL — ABNORMAL HIGH (ref 70–99)

## 2021-04-04 MED ORDER — INSULIN ASPART 100 UNIT/ML IJ SOLN
7.0000 [IU] | Freq: Three times a day (TID) | INTRAMUSCULAR | 11 refills | Status: AC
Start: 2021-04-04 — End: ?

## 2021-04-04 MED ORDER — INSULIN GLARGINE-YFGN 100 UNIT/ML ~~LOC~~ SOLN
33.0000 [IU] | Freq: Every morning | SUBCUTANEOUS | 11 refills | Status: AC
Start: 2021-04-04 — End: ?

## 2021-04-04 NOTE — Progress Notes (Signed)
Physical Therapy Treatment ?Patient Details ?Name: Molly [name] ?MRN: [id number] ?DOB: February 14, 1970 ?Today's Date: 04/04/2021 ? ? ?History of Present Illness Pt is a 51 y.o. female s/p Reimplantation right total knee arthroplasty and resection of right knee articulating spacer. PMH significant for HTN, HLD, bipolar disorder, depression, DM, and R TKA. ? ?  ?PT Comments  ? ? Pt continues to participate well. Plan is for d/c home later today. All education completed.    ?Recommendations for follow up therapy are one component of a multi-disciplinary discharge planning process, led by the attending physician.  Recommendations may be updated based on patient status, additional functional criteria and insurance authorization. ? ?Follow Up Recommendations ? Follow physician's recommendations for discharge plan and follow up therapies (plan is for OPPT) ?  ?  ?Assistance Recommended at Discharge Intermittent Supervision/Assistance  ?Patient can return home with the following A little help with walking and/or transfers;A little help with bathing/dressing/bathroom;Assistance with cooking/housework;Assist for transportation;Help with stairs or ramp for entrance ?  ?Equipment Recommendations ? None recommended by PT  ?  ?Recommendations for Other Services   ? ? ?  ?Precautions / Restrictions Precautions ?Precautions: Fall ?Restrictions ?Weight Bearing Restrictions: No ?RLE Weight Bearing: Weight bearing as tolerated  ?  ? ?Mobility ? Bed Mobility ?Overal bed mobility: Needs Assistance ?Bed Mobility: Supine to Sit, Sit to Supine ?  ?  ?Supine to sit: Supervision, HOB elevated ?Sit to supine: Supervision, HOB elevated ?  ?General bed mobility comments: Pt used UEs to assist R LE on/off bed ?  ? ?Transfers ?Overall transfer level: Needs assistance ?Equipment used: Rolling walker (2 wheels) ?Transfers: Sit to/from Stand ?Sit to Stand: Supervision ?  ?  ?  ?  ?  ?General transfer comment: Supv for safety. Pt prefers to use her  rollator. ?  ? ?Ambulation/Gait ?Ambulation/Gait assistance: Supervision ?Gait Distance (Feet): 90 Feet ?Assistive device: Rollator (4 wheels) ?Gait Pattern/deviations: Step-through pattern, Decreased stride length ?  ?  ?  ?General Gait Details: Pt prefers to use her rollator. ? ? ?Stairs ?  ?  ?  ?  ?  ? ? ?Wheelchair Mobility ?  ? ?Modified Rankin (Stroke Patients Only) ?  ? ? ?  ?Balance Overall balance assessment: Needs assistance ?  ?  ?  ?  ?Standing balance support: Bilateral upper extremity supported, During functional activity, No upper extremity supported ?Standing balance-Leahy Scale: Poor ?  ?  ?  ?  ?  ?  ?  ?  ?  ?  ?  ?  ?  ? ?  ?Cognition Arousal/Alertness: Awake/alert ?Behavior During Therapy: Flat affect ?Overall Cognitive Status: Within Functional Limits for tasks assessed ?  ?  ?  ?  ?  ?  ?  ?  ?  ?  ?  ?  ?  ?  ?  ?  ?  ?  ?  ? ?  ?Exercises Total Joint Exercises ?Ankle Circles/Pumps: AROM, Both, 10 reps ?Quad Sets: AROM, Both, 10 reps ?Heel Slides: AAROM, Right, 10 reps (pt used gait belt to assist) ?Hip ABduction/ADduction: AROM, Right, 10 reps ?Straight Leg Raises: AAROM, Right, 10 reps (pt used gait belt to assist) ? ?  ?General Comments   ?  ?  ? ?Pertinent Vitals/Pain Pain Assessment ?Pain Assessment: Faces ?Faces Pain Scale: Hurts little more ?Pain Location: R knee ?Pain Descriptors / Indicators: Discomfort, Sore, Aching ?Pain Intervention(s): Limited activity within patient's tolerance, Monitored during session, Repositioned  ? ? ?Home Living   ?  ?  ?  ?  ?  ?  ?  ?  ?  ?   ?  ?  Prior Function    ?  ?  ?   ? ?PT Goals (current goals can now be found in the care plan section) Progress towards PT goals: Progressing toward goals ? ?  ?Frequency ? ? ? 7X/week ? ? ? ?  ?PT Plan Current plan remains appropriate  ? ? ?Co-evaluation   ?  ?  ?  ?  ? ?  ?AM-PAC PT "6 Clicks" Mobility   ?Outcome Measure ? Help needed turning from your back to your side while in a flat bed without using bedrails?:  A Little ?Help needed moving from lying on your back to sitting on the side of a flat bed without using bedrails?: A Little ?Help needed moving to and from a bed to a chair (including a wheelchair)?: A Little ?Help needed standing up from a chair using your arms (e.g., wheelchair or bedside chair)?: A Little ?Help needed to walk in hospital room?: A Little ?Help needed climbing 3-5 steps with a railing? : A Little ?6 Click Score: 18 ? ?  ?End of Session Equipment Utilized During Treatment: Gait belt ?Activity Tolerance: Patient tolerated treatment well ?Patient left: in bed;with call bell/phone within reach ?  ?PT Visit Diagnosis: Pain;Other abnormalities of gait and mobility (R26.89) ?Pain - Right/Left: Right ?Pain - part of body: Knee ?  ? ? ?Time: [id number] ?PT Time Calculation (min) (ACUTE ONLY): 14 min ? ?Charges:  $Gait Training: 8-22 mins          ?          ? ?Doreatha Massed, PT ?Acute Rehabilitation  ?Office: [phone] ?Pager: [phone] ? ? ? ?

## 2021-04-04 NOTE — Progress Notes (Signed)
Subjective: ?4 Days Post-Op Procedure(s) (LRB): ?RESECTION OF RIGHT KNEE ARTICULATING SPACER REVISION OF TOTAL KNEE ARTHROPLASTY (Right) ?Patient reports pain as mild.   ?Minimal pain. Anxious to go home. ?Med changes yesterday in an attempt to control sugars. No c/o of adverse effects. ? ?Objective: ?Vital signs in last 24 hours: ?Temp:  [98.8 ?F (37.1 ?C)-99.3 ?F (37.4 ?C)] 99.3 ?F (37.4 ?C) (03/12 8938) ?Pulse Rate:  [97-110] 97 (03/12 0607) ?Resp:  [16-18] 17 (03/12 1017) ?BP: (128-157)/(49-60) 157/59 (03/12 5102) ?SpO2:  [95 %-100 %] 99 % (03/12 0824) ? ?Intake/Output from previous day: ?03/11 0701 - 03/12 0700 ?In: 925.2 [P.O.:840; I.V.:85.2] ?Out: -  ?Intake/Output this shift: ?No intake/output data recorded. ? ?Recent Labs  ?  04/02/21 ?0322 04/03/21 ?0312  ?HGB 9.4* 8.5*  ? ?Recent Labs  ?  04/02/21 ?0322 04/03/21 ?0312  ?WBC 7.9 9.7  ?RBC 2.96* 2.70*  ?HCT 29.1* 26.7*  ?PLT 143* 165  ? ?No results for input(s): NA, K, CL, CO2, BUN, CREATININE, GLUCOSE, CALCIUM in the last 72 hours. ?No results for input(s): LABPT, INR in the last 72 hours. ? ?Neurologically intact ?ABD soft ?Neurovascular intact ?Sensation intact distally ?Intact pulses distally ?Dorsiflexion/Plantar flexion intact ?Incision: dressing C/D/I and no drainage ?No cellulitis present ?Compartment soft ?No sign of DVT ? ? ?Assessment/Plan: ?4 Days Post-Op Procedure(s) (LRB): ?RESECTION OF RIGHT KNEE ARTICULATING SPACER REVISION OF TOTAL KNEE ARTHROPLASTY (Right) ?Advance diet ?Up with therapy ?D/C IV fluids ?Glucose much improved. Last 3 numbers (since yesterday evening) 105, 100, 115 (down from 300). ?Plan D/C home today with med changes recommended yesterday by diabetes coordinator, appreciate input ?Discussed with Dr Shelle Iron ?Follow up outpatient with Dr Linna Caprice ? ?Molly [name] ?04/04/2021, 8:33 AM ? ?

## 2021-04-04 NOTE — Progress Notes (Signed)
The patient is alert and oriented and has been seen by her physician. The orders for discharge were written. IV has been removed. Went over discharge instructions with patient. She is being discharged via wheelchair with all of her belongings.   

## 2021-04-04 NOTE — Plan of Care (Signed)
  Problem: Coping: Goal: Level of anxiety will decrease Outcome: Progressing   Problem: Pain Managment: Goal: General experience of comfort will improve Outcome: Progressing   

## 2021-04-05 ENCOUNTER — Encounter (HOSPITAL_COMMUNITY): Payer: Self-pay | Admitting: Orthopedic Surgery

## 2021-04-05 LAB — AEROBIC/ANAEROBIC CULTURE W GRAM STAIN (SURGICAL/DEEP WOUND)
Culture: NO GROWTH
Gram Stain: NONE SEEN

## 2021-04-13 DIAGNOSIS — M25561 Pain in right knee: Secondary | ICD-10-CM | POA: Diagnosis not present

## 2021-04-16 DIAGNOSIS — Z4733 Aftercare following explantation of knee joint prosthesis: Secondary | ICD-10-CM | POA: Diagnosis not present

## 2021-04-20 DIAGNOSIS — M25561 Pain in right knee: Secondary | ICD-10-CM | POA: Diagnosis not present

## 2021-04-22 DIAGNOSIS — M25561 Pain in right knee: Secondary | ICD-10-CM | POA: Diagnosis not present

## 2021-04-26 DIAGNOSIS — M25561 Pain in right knee: Secondary | ICD-10-CM | POA: Diagnosis not present

## 2021-04-27 DIAGNOSIS — Z0000 Encounter for general adult medical examination without abnormal findings: Secondary | ICD-10-CM | POA: Diagnosis not present

## 2021-04-27 DIAGNOSIS — K219 Gastro-esophageal reflux disease without esophagitis: Secondary | ICD-10-CM | POA: Diagnosis not present

## 2021-04-27 DIAGNOSIS — I10 Essential (primary) hypertension: Secondary | ICD-10-CM | POA: Diagnosis not present

## 2021-04-27 DIAGNOSIS — E109 Type 1 diabetes mellitus without complications: Secondary | ICD-10-CM | POA: Diagnosis not present

## 2021-04-27 DIAGNOSIS — E7800 Pure hypercholesterolemia, unspecified: Secondary | ICD-10-CM | POA: Diagnosis not present

## 2021-05-06 DIAGNOSIS — M25561 Pain in right knee: Secondary | ICD-10-CM | POA: Diagnosis not present

## 2021-05-11 DIAGNOSIS — M25561 Pain in right knee: Secondary | ICD-10-CM | POA: Diagnosis not present

## 2021-05-14 DIAGNOSIS — M25561 Pain in right knee: Secondary | ICD-10-CM | POA: Diagnosis not present

## 2021-05-17 DIAGNOSIS — Z4789 Encounter for other orthopedic aftercare: Secondary | ICD-10-CM | POA: Diagnosis not present

## 2021-05-18 DIAGNOSIS — M25561 Pain in right knee: Secondary | ICD-10-CM | POA: Diagnosis not present

## 2021-05-20 DIAGNOSIS — M25561 Pain in right knee: Secondary | ICD-10-CM | POA: Diagnosis not present

## 2021-05-25 DIAGNOSIS — M25561 Pain in right knee: Secondary | ICD-10-CM | POA: Diagnosis not present

## 2021-05-27 DIAGNOSIS — M25561 Pain in right knee: Secondary | ICD-10-CM | POA: Diagnosis not present

## 2021-06-01 DIAGNOSIS — M25561 Pain in right knee: Secondary | ICD-10-CM | POA: Diagnosis not present

## 2021-06-03 DIAGNOSIS — M25561 Pain in right knee: Secondary | ICD-10-CM | POA: Diagnosis not present

## 2021-06-08 DIAGNOSIS — M25561 Pain in right knee: Secondary | ICD-10-CM | POA: Diagnosis not present

## 2021-06-10 DIAGNOSIS — M25561 Pain in right knee: Secondary | ICD-10-CM | POA: Diagnosis not present

## 2021-06-15 DIAGNOSIS — M25561 Pain in right knee: Secondary | ICD-10-CM | POA: Diagnosis not present

## 2021-06-17 DIAGNOSIS — M25561 Pain in right knee: Secondary | ICD-10-CM | POA: Diagnosis not present

## 2021-06-22 DIAGNOSIS — M25561 Pain in right knee: Secondary | ICD-10-CM | POA: Diagnosis not present

## 2021-06-25 DIAGNOSIS — Z4789 Encounter for other orthopedic aftercare: Secondary | ICD-10-CM | POA: Diagnosis not present

## 2021-06-30 ENCOUNTER — Ambulatory Visit (HOSPITAL_COMMUNITY)
Admission: EM | Admit: 2021-06-30 | Discharge: 2021-06-30 | Disposition: A | Discharge status: Home / Self Care | Payer: 59 | Attending: Emergency Medicine | Admitting: Emergency Medicine

## 2021-06-30 ENCOUNTER — Encounter (HOSPITAL_COMMUNITY): Payer: Self-pay | Admitting: Emergency Medicine

## 2021-06-30 DIAGNOSIS — L0201 Cutaneous abscess of face: Secondary | ICD-10-CM | POA: Diagnosis not present

## 2021-06-30 MED ORDER — AMOXICILLIN-POT CLAVULANATE 500-125 MG PO TABS: 1.0000 | ORAL_TABLET | Freq: Two times a day (BID) | ORAL | 0 refills | Status: AC

## 2021-06-30 NOTE — Discharge Instructions (Signed)
Please take medication as prescribed. I recommend follow up with your primary care provider. Please go to the emergency department if symptoms worsen.

## 2021-06-30 NOTE — ED Triage Notes (Signed)
Pt reports an abscess on the left side of face x 1 month. Denies any drainage.

## 2021-06-30 NOTE — ED Provider Notes (Signed)
[facility]    CSN: [id number] Arrival date & time: 06/30/21  0805      History   Chief Complaint Chief Complaint  Patient presents with   Abscess    HPI Molly [name] is a 51 y.o. female.  Presents for facial abscess x1 month.  She has had pain but no drainage.  She presents today expecting it to be drained. Denies fever, chills, shortness of breath, abdominal pain, vomiting/diarrhea.  Denies dental pain, headache. She saw her primary care provider for this who referred her to dermatology.  She did not see dermatology as she feels this is a waste of her money.  Past Medical History:  Diagnosis Date   ALLERGIC RHINITIS CAUSE UNSPECIFIED 03/06/2007   Bipolar 1 disorder (HCC)    CONTUSION, ANKLE 08/28/2007   DEPRESSION 08/03/2006   DIABETES MELLITUS, TYPE I 08/03/2006   DM retinopathy (HCC)    Encounter for long-term (current) use of other medications    Hyperlipidemia    Hypertension    MIGRAINE, CHRONIC 03/06/2007   Osteoporosis    SEBACEOUS CYST, INFECTED 07/13/2007   SMOKER 08/11/2009   THROMBOCYTOPENIA 05/07/2008   TINEA VERSICOLOR 07/13/2007    Patient Active Problem List   Diagnosis Date Noted   Septic arthritis of knee, right (HCC) 03/31/2021   Medication monitoring encounter 12/08/2020   Osteomyelitis (HCC) 11/19/2020   Protein-calorie malnutrition, severe 11/11/2020   Unspecified severe protein-calorie malnutrition (HCC) 11/10/2020   Septic joint of right knee joint (HCC) 11/09/2020   Sinusitis 04/20/2017   Otalgia of both ears 03/30/2016   Strain of chest wall 11/30/2015   Amenorrhea, secondary 03/02/2015   Recurrent acute sinusitis 05/23/2014   Anemia 04/08/2014   Diabetic ketoacidosis without coma associated with type 1 diabetes mellitus (HCC)    HLD (hyperlipidemia) 04/07/2014   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   DKA (diabetic ketoacidoses) 04/06/2014   Nausea & vomiting 04/06/2014   Bipolar 1 disorder (HCC)    Pain in joint,  upper arm 10/22/2013   Routine general medical examination at a health care facility 10/04/2013   Visit for occupational health examination 11/10/2012   Nonspecific abnormal electrocardiogram (ECG) (EKG) 10/02/2012   Osteoporosis 10/02/2012   Quit smoking 04/24/2012   Arthralgia of hand, left 12/01/2011   Encounter for long-term (current) use of other medications 09/14/2010   HEMATURIA UNSPECIFIED 08/11/2009   PURE HYPERCHOLESTEROLEMIA 08/05/2008   THROMBOCYTOPENIA 05/07/2008   CONTUSION, ANKLE 08/28/2007   TINEA VERSICOLOR 07/13/2007   SEBACEOUS CYST, INFECTED 07/13/2007   COUGH 04/18/2007   Migraine 03/06/2007   Seasonal and perennial allergic rhinitis 03/06/2007   Type 1 diabetes mellitus (HCC) 08/03/2006   Depression 08/03/2006    Past Surgical History:  Procedure Laterality Date   Abnormal U/S  12/10/1996   BIOPSY  11/16/2020   Procedure: BIOPSY;  Surgeon: Charlott Rakes, MD;  Location: [facility];  Service: Endoscopy;;   ELECTROCARDIOGRAM  06/13/2006   ESOPHAGOGASTRODUODENOSCOPY (EGD) WITH PROPOFOL N/A 11/16/2020   Procedure: ESOPHAGOGASTRODUODENOSCOPY (EGD) WITH PROPOFOL;  Surgeon: Charlott Rakes, MD;  Location: [facility];  Service: Endoscopy;  Laterality: N/A;   KNEE ARTHROPLASTY Right 11/13/2020   Procedure: DEBRIDEMENT KNEE WITH PLACEMENT ARTICULATING ANTIBIOTIC SPACER;  Surgeon: Samson Frederic, MD;  Location: [facility];  Service: Orthopedics;  Laterality: Right;  cultures,zimmer cement, tobramycin 2.4gm per pack of cement(will need 4)   TOTAL KNEE REVISION Right 03/31/2021   Procedure: RESECTION OF RIGHT KNEE ARTICULATING SPACER REVISION OF TOTAL KNEE ARTHROPLASTY;  Surgeon: Samson Frederic, MD;  Location: [facility];  Service: Orthopedics;  Laterality: Right;  150   TUBAL LIGATION      OB History     Gravida  4   Para  2   Term  1   Preterm  1   AB  2   Living  2      SAB  2   IAB      Ectopic  0   Multiple      Live Births  2             Home Medications    Prior to Admission medications   Medication Sig Start Date End Date Taking? Authorizing Provider  Alcohol Swabs (ALCOHOL PREP) 70 % PADS 8/day  250.01 10/27/11   Romero Belling, MD  amLODipine (NORVASC) 5 MG tablet Take 10 mg by mouth daily.    [provider]  amoxicillin-clavulanate (AUGMENTIN) 500-125 MG tablet Take 1 tablet (500 mg total) by mouth in the morning and at bedtime for 10 days. 06/30/21 07/10/21 Yes Sherian Valenza, Lurena Joiner, PA-C  beclomethasone (QVAR) 80 MCG/ACT inhaler Inhale 2 puffs into the lungs 2 (two) times daily.    [provider]  bismuth subsalicylate (PEPTO BISMOL) 262 MG/15ML suspension Take 30 mLs by mouth every 6 (six) hours as needed for diarrhea or loose stools or indigestion.    [provider]  Calcium-Magnesium-Vitamin D (CALCIUM 1200+D3 PO) Take 1 tablet by mouth daily.    [provider]  carbamazepine (TEGRETOL) 200 MG tablet Take 1 tablet (200 mg total) by mouth every morning AND 5 tablets (1,000 mg total) at bedtime. Patient taking differently: Take 1 tablet (200 mg total) by mouth every morning AND 6 tablets (1,200 mg total) after dinner 11/19/20   Narda Bonds, MD  cephALEXin (KEFLEX) 500 MG capsule Take 1 capsule (500 mg total) by mouth every 8 (eight) hours. 04/02/21 03/28/22  Swinteck, Arlys John, MD  cyclobenzaprine (FLEXERIL) 10 MG tablet Take 1 tablet (10 mg total) by mouth 3 (three) times daily as needed for muscle spasms. 04/02/21   Swinteck, Arlys John, MD  docusate sodium (COLACE) 100 MG capsule Take 1 capsule (100 mg total) by mouth 2 (two) times daily. 04/02/21   Swinteck, Arlys John, MD  famotidine (PEPCID) 20 MG tablet Take 20 mg by mouth daily as needed for heartburn or indigestion.    [provider]  feeding supplement, GLUCERNA SHAKE, (GLUCERNA SHAKE) LIQD Take 237 mLs by mouth 3 (three) times daily between meals. Patient taking differently: Take 237 mLs by mouth daily. 11/19/20   Narda Bonds,  MD  glucose blood (FREESTYLE LITE) test strip 8/day dx 250.01, and lancets 06/01/15   Romero Belling, MD  HYDROmorphone (DILAUDID) 2 MG tablet Take 1 tablet (2 mg total) by mouth every 4 (four) hours as needed for moderate pain. 04/02/21   Swinteck, Arlys John, MD  ibuprofen (ADVIL) 200 MG tablet Take 1,000 mg by mouth 3 (three) times daily as needed for moderate pain.    [provider]  insulin aspart (NOVOLOG) 100 UNIT/ML injection Inject 7 Units into the skin 3 (three) times daily with meals. 04/04/21   Dorothy Spark, PA-C  insulin glargine-yfgn (SEMGLEE) 100 UNIT/ML injection Inject 0.33 mLs (33 Units total) into the skin every morning. 04/04/21   Dorothy Spark, PA-C  lamoTRIgine (LAMICTAL) 200 MG tablet Take 400 mg by mouth daily.    [provider]  Lancets (FREESTYLE) lancets Use as instructed dx 250.01 08/10/12   Romero Belling, MD  lithium carbonate (LITHOBID) 300 MG CR tablet Take 300 mg by mouth daily after supper.    [provider]  loratadine-pseudoephedrine (CLARITIN-D 24-HOUR) 10-240 MG 24 hr tablet Take 1 tablet by mouth daily.    [provider]  Multiple Vitamin (MULTIVITAMIN) tablet Take 1 tablet by mouth daily.    [provider]  ondansetron (ZOFRAN) 4 MG tablet Take 1 tablet (4 mg total) by mouth every 6 (six) hours as needed for nausea. 04/02/21   Swinteck, Arlys JohnBrian, MD  polyethylene glycol (MIRALAX / GLYCOLAX) 17 g packet Take 17 g by mouth daily as needed for mild constipation. 04/02/21   Swinteck, Arlys JohnBrian, MD  POTASSIUM PO Take 650 mg by mouth daily.    [provider]  promethazine (PHENERGAN) 25 MG tablet Take 25 mg by mouth every 6 (six) hours as needed for nausea or vomiting.    [provider]  Rimegepant Sulfate (NURTEC) 75 MG TBDP Take 75 mg by mouth daily as needed (migraines).    [provider]  rosuvastatin (CRESTOR) 40 MG tablet TAKE 1 TABLET BY MOUTH DAILY GENERIC EQUIVALENT FOR CRESTOR Patient  taking differently: Take 40 mg by mouth at bedtime. 12/10/18   Olive BassMurray, Laura Woodruff, FNP  senna (SENOKOT) 8.6 MG TABS tablet Take 2 tablets (17.2 mg total) by mouth at bedtime. 04/02/21   Swinteck, Arlys JohnBrian, MD  ziprasidone (GEODON) 60 MG capsule Take 120 mg by mouth daily after supper.    [provider]  zolpidem (AMBIEN) 10 MG tablet Take 2 tablets (20 mg total) by mouth at bedtime. 11/19/20   Narda BondsNettey, Ralph A, MD    Family History Family History  Problem Relation Age of Onset   Diabetes Father        (Oral Agents)   Diabetes Paternal Grandmother    Hypertension Paternal Grandmother    Diabetes Paternal Grandfather    Cancer Paternal Grandfather        colon   Hypertension Paternal Grandfather     Social History Social History   Tobacco Use   Smoking status: Former    Types: Cigarettes    Quit date: 10/16/2010    Years since quitting: 10.7   Smokeless tobacco: Never  Vaping Use   Vaping Use: Never used  Substance Use Topics   Alcohol use: No   Drug use: No     Allergies   Varenicline tartrate   Review of Systems Review of Systems Per HPI  Physical Exam Triage Vital Signs ED Triage Vitals  Enc Vitals Group     BP 06/30/21 0823 124/75     Pulse Rate 06/30/21 0823 90     Resp 06/30/21 0823 17     Temp 06/30/21 0823 98.3 F (36.8 C)     Temp Source 06/30/21 0823 Oral     SpO2 06/30/21 0823 98 %     Weight 06/30/21 0821 160 lb 12.8 oz (72.9 kg)     Height --      Head Circumference --      Peak Flow --      Pain Score 06/30/21 0821 0     Pain Loc --      Pain Edu? --      Excl. in GC? --    No data found.  Updated Vital Signs BP 124/75 (BP Location: Left Arm)   Pulse 90   Temp 98.3 F (36.8 C) (Oral)   Resp 17   Wt 160 lb 12.8 oz (72.9 kg)  SpO2 98%   BMI 25.18 kg/m    Physical Exam Vitals and nursing note reviewed.  Constitutional:      General: She is not in acute distress.    Appearance: She is well-developed.  HENT:     Head:  Normocephalic and atraumatic.     Jaw: No pain on movement.      Comments: Very small, nonfluctuant, flat area of erythema.  Tender to touch    Mouth/Throat:     Mouth: Mucous membranes are moist. No angioedema.     Dentition: No dental tenderness, gingival swelling, dental caries or gum lesions.     Pharynx: Oropharynx is clear. No posterior oropharyngeal erythema.  Eyes:     Conjunctiva/sclera: Conjunctivae normal.     Pupils: Pupils are equal, round, and reactive to light.  Cardiovascular:     Rate and Rhythm: Normal rate and regular rhythm.     Heart sounds: Normal heart sounds.  Pulmonary:     Effort: Pulmonary effort is normal. No respiratory distress.     Breath sounds: Normal breath sounds.  Musculoskeletal:     Cervical back: Normal range of motion.  Lymphadenopathy:     Cervical: No cervical adenopathy.  Neurological:     Mental Status: She is alert.     UC Treatments / Results  Labs (all labs ordered are listed, but only abnormal results are displayed) Labs Reviewed - No data to display  EKG  Radiology No results found.  Procedures Procedures (including critical care time)  Medications Ordered in UC Medications - No data to display  Initial Impression / Assessment and Plan / UC Course  I have reviewed the triage vital signs and the nursing notes.  Pertinent labs & imaging results that were available during my care of the patient were reviewed by me and considered in my medical decision making (see chart for details).   Patient expecting immediate incision and drainage in clinic today.  I discussed with her that at this time abscess does not require drainage.  It is very small and nonfluctuant, I do not feel any area that can be incised at this time. Patient voices that she came here to have it drained and states this is a waste of her time.  Discussed with patient that we can try a course of antibiotic to cover for facial versus dental infection. She reports  she is taking cephalexin for her recent knee surgery. I discussed with her that we can try a different medication that may cover for dental infection. I recommend that she follow-up with her primary care or go to her dermatology referral for follow-up. Patient agrees to plan. Return precautions discussed, discharged in stable condition.  Final Clinical Impressions(s) / UC Diagnoses   Final diagnoses:  Facial abscess     Discharge Instructions      Please take medication as prescribed. I recommend follow up with your primary care provider. Please go to the emergency department if symptoms worsen.    ED Prescriptions     Medication Sig Dispense Auth. Provider   amoxicillin-clavulanate (AUGMENTIN) 500-125 MG tablet Take 1 tablet (500 mg total) by mouth in the morning and at bedtime for 10 days. 20 tablet Whyatt Klinger, Lurena Joiner, PA-C      PDMP not reviewed this encounter.   Amel Gianino, Lurena Joiner, [place] 06/30/21 [phone]

## 2021-07-05 DIAGNOSIS — I10 Essential (primary) hypertension: Secondary | ICD-10-CM | POA: Diagnosis not present

## 2021-07-05 DIAGNOSIS — Z9641 Presence of insulin pump (external) (internal): Secondary | ICD-10-CM | POA: Diagnosis not present

## 2021-07-05 DIAGNOSIS — E1065 Type 1 diabetes mellitus with hyperglycemia: Secondary | ICD-10-CM | POA: Diagnosis not present

## 2021-07-05 DIAGNOSIS — Z4681 Encounter for fitting and adjustment of insulin pump: Secondary | ICD-10-CM | POA: Diagnosis not present

## 2021-07-28 DIAGNOSIS — G43701 Chronic migraine without aura, not intractable, with status migrainosus: Secondary | ICD-10-CM | POA: Diagnosis not present

## 2021-07-28 DIAGNOSIS — I10 Essential (primary) hypertension: Secondary | ICD-10-CM | POA: Diagnosis not present

## 2021-07-28 DIAGNOSIS — E7800 Pure hypercholesterolemia, unspecified: Secondary | ICD-10-CM | POA: Diagnosis not present

## 2021-08-16 DIAGNOSIS — E10649 Type 1 diabetes mellitus with hypoglycemia without coma: Secondary | ICD-10-CM | POA: Diagnosis not present

## 2021-08-16 DIAGNOSIS — Z9641 Presence of insulin pump (external) (internal): Secondary | ICD-10-CM | POA: Diagnosis not present

## 2021-08-16 DIAGNOSIS — Z4681 Encounter for fitting and adjustment of insulin pump: Secondary | ICD-10-CM | POA: Diagnosis not present

## 2021-09-14 DIAGNOSIS — Z96651 Presence of right artificial knee joint: Secondary | ICD-10-CM | POA: Diagnosis not present

## 2021-09-14 DIAGNOSIS — Z0000 Encounter for general adult medical examination without abnormal findings: Secondary | ICD-10-CM | POA: Diagnosis not present

## 2021-09-16 DIAGNOSIS — Z794 Long term (current) use of insulin: Secondary | ICD-10-CM | POA: Diagnosis not present

## 2021-09-16 DIAGNOSIS — E109 Type 1 diabetes mellitus without complications: Secondary | ICD-10-CM | POA: Diagnosis not present

## 2021-09-21 DIAGNOSIS — Z96651 Presence of right artificial knee joint: Secondary | ICD-10-CM | POA: Diagnosis not present

## 2021-09-22 DIAGNOSIS — R69 Illness, unspecified: Secondary | ICD-10-CM | POA: Diagnosis not present

## 2021-10-27 DIAGNOSIS — Z20822 Contact with and (suspected) exposure to covid-19: Secondary | ICD-10-CM | POA: Diagnosis not present

## 2021-10-27 DIAGNOSIS — Z1152 Encounter for screening for COVID-19: Secondary | ICD-10-CM | POA: Diagnosis not present

## 2021-10-27 DIAGNOSIS — J069 Acute upper respiratory infection, unspecified: Secondary | ICD-10-CM | POA: Diagnosis not present

## 2021-12-06 DIAGNOSIS — M25522 Pain in left elbow: Secondary | ICD-10-CM | POA: Diagnosis not present

## 2021-12-06 DIAGNOSIS — E10649 Type 1 diabetes mellitus with hypoglycemia without coma: Secondary | ICD-10-CM | POA: Diagnosis not present

## 2021-12-06 DIAGNOSIS — Z9641 Presence of insulin pump (external) (internal): Secondary | ICD-10-CM | POA: Diagnosis not present

## 2021-12-06 DIAGNOSIS — R202 Paresthesia of skin: Secondary | ICD-10-CM | POA: Diagnosis not present

## 2021-12-06 DIAGNOSIS — Z4681 Encounter for fitting and adjustment of insulin pump: Secondary | ICD-10-CM | POA: Diagnosis not present

## 2021-12-29 DIAGNOSIS — E109 Type 1 diabetes mellitus without complications: Secondary | ICD-10-CM | POA: Diagnosis not present

## 2021-12-29 DIAGNOSIS — Z794 Long term (current) use of insulin: Secondary | ICD-10-CM | POA: Diagnosis not present

## 2022-01-05 DIAGNOSIS — M25522 Pain in left elbow: Secondary | ICD-10-CM | POA: Diagnosis not present

## 2022-01-05 DIAGNOSIS — R202 Paresthesia of skin: Secondary | ICD-10-CM | POA: Diagnosis not present

## 2022-01-05 DIAGNOSIS — R200 Anesthesia of skin: Secondary | ICD-10-CM | POA: Diagnosis not present

## 2022-01-21 DIAGNOSIS — E109 Type 1 diabetes mellitus without complications: Secondary | ICD-10-CM | POA: Diagnosis not present

## 2023-05-11 IMAGING — DX DG KNEE 1-2V PORT*R*
2 series · 2 of 2 positions shown · non-contrast
Comparison: None.

CLINICAL DATA: Status post right knee replacement.

EXAM:
PORTABLE RIGHT KNEE - 1-2 VIEW

[knee ap]
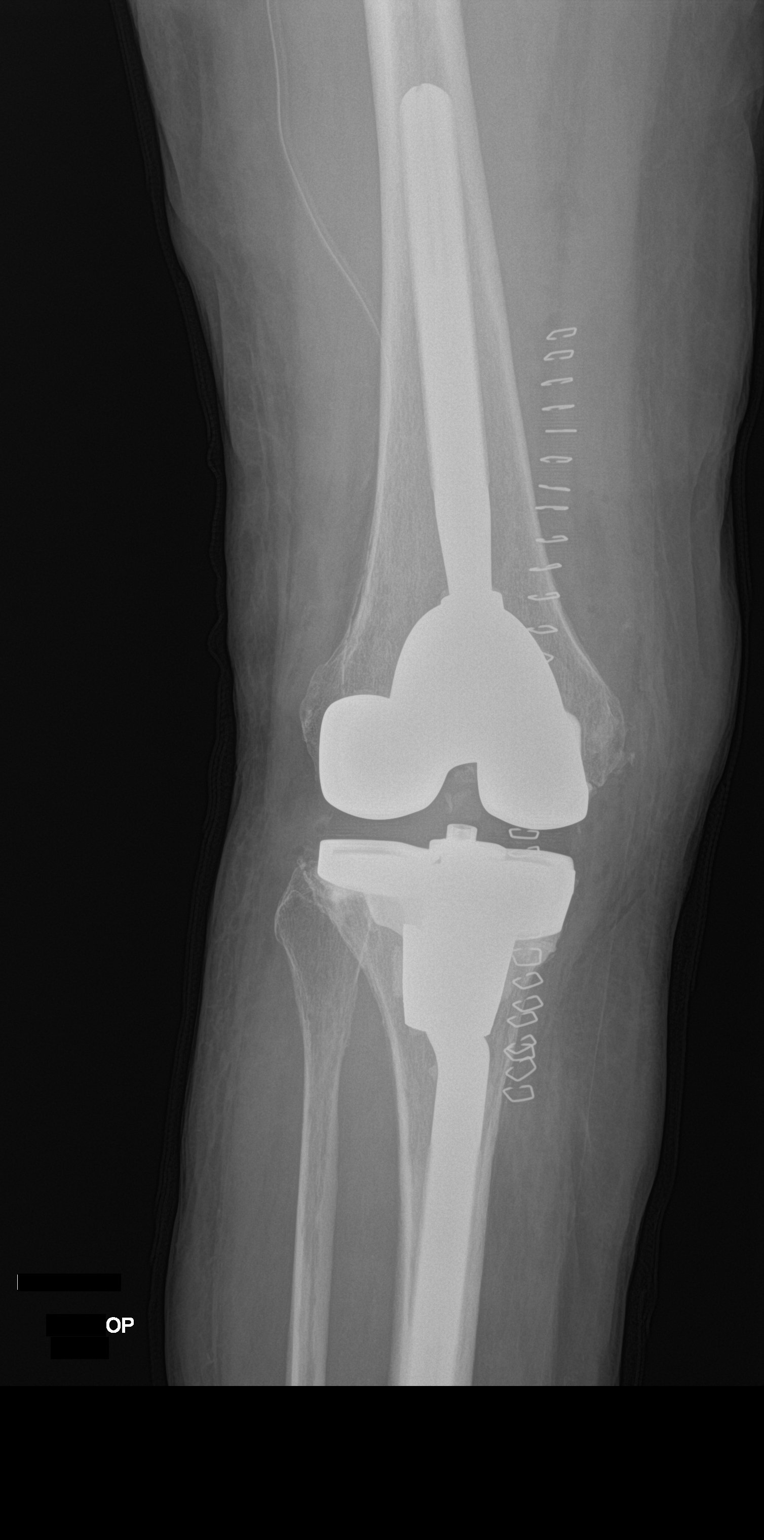

[knee lat]
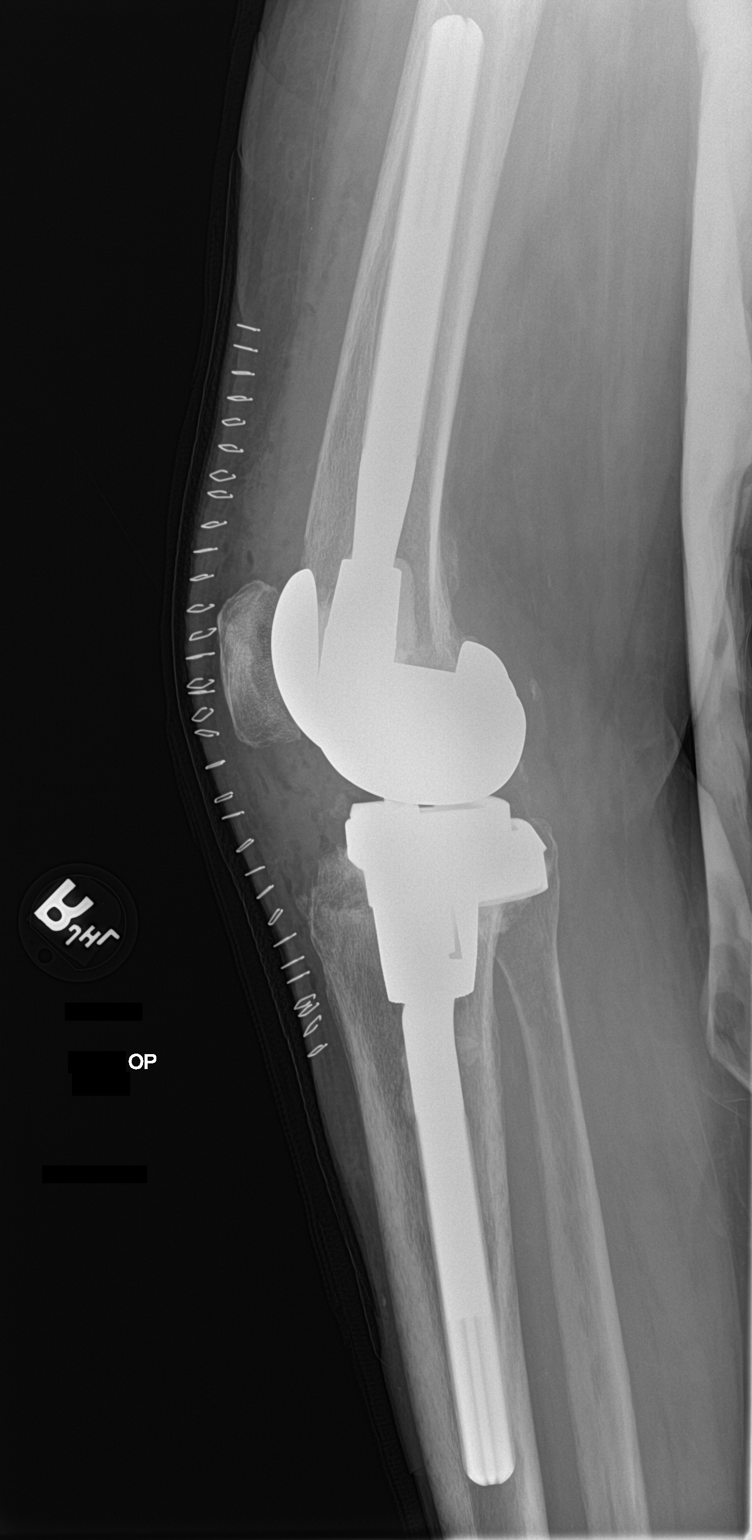

[2 of 2 positions shown; findings below may reference images not displayed]

FINDINGS: Right knee arthroplasty in expected alignment. No periprosthetic
lucency or fracture. There has been patellar resurfacing. Recent
postsurgical change includes air and edema in the soft tissues and
joint space. Anterior skin staples.
IMPRESSION: Right knee arthroplasty without immediate postoperative
complication.
